# Patient Record
Sex: Female | Born: 1945 | Race: White | Hispanic: No | Marital: Married | Smoking: Former smoker
Health system: Southern US, Community
[De-identification: ages and names within clinical notes are randomized; demographics above are authoritative.]

## PROBLEM LIST (undated history)

## (undated) DIAGNOSIS — K219 Gastro-esophageal reflux disease without esophagitis: Secondary | ICD-10-CM

## (undated) DIAGNOSIS — T8859XA Other complications of anesthesia, initial encounter: Secondary | ICD-10-CM

## (undated) DIAGNOSIS — T4145XA Adverse effect of unspecified anesthetic, initial encounter: Secondary | ICD-10-CM

## (undated) DIAGNOSIS — Z9889 Other specified postprocedural states: Secondary | ICD-10-CM

## (undated) DIAGNOSIS — D649 Anemia, unspecified: Secondary | ICD-10-CM

## (undated) DIAGNOSIS — M810 Age-related osteoporosis without current pathological fracture: Secondary | ICD-10-CM

## (undated) DIAGNOSIS — K5790 Diverticulosis of intestine, part unspecified, without perforation or abscess without bleeding: Secondary | ICD-10-CM

## (undated) DIAGNOSIS — T7840XA Allergy, unspecified, initial encounter: Secondary | ICD-10-CM

## (undated) DIAGNOSIS — Z87442 Personal history of urinary calculi: Secondary | ICD-10-CM

## (undated) DIAGNOSIS — B029 Zoster without complications: Secondary | ICD-10-CM

## (undated) DIAGNOSIS — E039 Hypothyroidism, unspecified: Secondary | ICD-10-CM

## (undated) DIAGNOSIS — Z8719 Personal history of other diseases of the digestive system: Secondary | ICD-10-CM

## (undated) DIAGNOSIS — I10 Essential (primary) hypertension: Secondary | ICD-10-CM

## (undated) DIAGNOSIS — R112 Nausea with vomiting, unspecified: Secondary | ICD-10-CM

## (undated) DIAGNOSIS — E7800 Pure hypercholesterolemia, unspecified: Secondary | ICD-10-CM

## (undated) HISTORY — DX: Essential (primary) hypertension: I10

## (undated) HISTORY — DX: Pure hypercholesterolemia, unspecified: E78.00

## (undated) HISTORY — DX: Hypothyroidism, unspecified: E03.9

## (undated) HISTORY — PX: BREAST EXCISIONAL BIOPSY: SUR124

## (undated) HISTORY — PX: BREAST BIOPSY: SHX20

---

## 2004-07-11 ENCOUNTER — Ambulatory Visit: Payer: Self-pay | Admitting: Unknown Physician Specialty

## 2005-01-01 ENCOUNTER — Ambulatory Visit: Payer: Self-pay | Admitting: Internal Medicine

## 2005-01-27 ENCOUNTER — Ambulatory Visit: Payer: Self-pay | Admitting: Internal Medicine

## 2005-04-07 ENCOUNTER — Ambulatory Visit: Payer: Self-pay | Admitting: Gastroenterology

## 2006-02-04 ENCOUNTER — Ambulatory Visit: Payer: Self-pay | Admitting: Internal Medicine

## 2006-03-06 ENCOUNTER — Ambulatory Visit: Payer: Self-pay | Admitting: Gastroenterology

## 2007-03-16 ENCOUNTER — Ambulatory Visit: Payer: Self-pay | Admitting: Internal Medicine

## 2007-03-19 ENCOUNTER — Ambulatory Visit: Payer: Self-pay | Admitting: Internal Medicine

## 2007-04-20 ENCOUNTER — Ambulatory Visit: Payer: Self-pay | Admitting: Surgery

## 2008-06-16 HISTORY — PX: EYE SURGERY: SHX253

## 2008-09-21 ENCOUNTER — Ambulatory Visit: Payer: Self-pay | Admitting: Internal Medicine

## 2009-03-15 ENCOUNTER — Ambulatory Visit: Payer: Self-pay | Admitting: Gastroenterology

## 2009-04-25 ENCOUNTER — Ambulatory Visit: Payer: Self-pay | Admitting: Ophthalmology

## 2009-05-07 ENCOUNTER — Ambulatory Visit: Payer: Self-pay | Admitting: Ophthalmology

## 2009-12-14 ENCOUNTER — Ambulatory Visit: Payer: Self-pay | Admitting: Internal Medicine

## 2009-12-19 ENCOUNTER — Ambulatory Visit: Payer: Self-pay | Admitting: Internal Medicine

## 2011-02-12 ENCOUNTER — Ambulatory Visit: Payer: Self-pay | Admitting: Internal Medicine

## 2011-04-07 ENCOUNTER — Ambulatory Visit: Payer: Self-pay | Admitting: Internal Medicine

## 2011-10-12 LAB — HM COLONOSCOPY

## 2012-04-14 ENCOUNTER — Ambulatory Visit: Payer: Self-pay | Admitting: Ophthalmology

## 2012-04-14 LAB — HEMOGLOBIN: HGB: 12.6 g/dL (ref 12.0–16.0)

## 2012-04-14 LAB — POTASSIUM: Potassium: 3.2 mmol/L — ABNORMAL LOW (ref 3.5–5.1)

## 2012-04-16 ENCOUNTER — Telehealth: Payer: Self-pay | Admitting: Internal Medicine

## 2012-04-18 ENCOUNTER — Telehealth: Payer: Self-pay | Admitting: Internal Medicine

## 2012-04-18 MED ORDER — POTASSIUM CHLORIDE ER 10 MEQ PO TBCR: 10.0000 meq | EXTENDED_RELEASE_TABLET | Freq: Every day | ORAL | Status: DC

## 2012-04-18 NOTE — Telephone Encounter (Signed)
Received labs from Dr Dingledeins office.  Pt scheduled for cataract surgery 04/26/12.  Potassium (pre op) was 3.2.  Wanted me to address.  Discussed with pt.  She is doing well.  No chest pain or tightness.  No sob.  Will start her on KCL q day.  Will have opthalmology check potassium prior to her surgery.  She will keep her follow up with me.  Sent in rx for kcl q day #30 with no refills to [facility].

## 2012-04-20 NOTE — Telephone Encounter (Signed)
Opened in error

## 2012-04-26 ENCOUNTER — Ambulatory Visit: Payer: Self-pay | Admitting: Ophthalmology

## 2012-04-29 ENCOUNTER — Encounter: Payer: Self-pay | Admitting: Internal Medicine

## 2012-04-29 ENCOUNTER — Ambulatory Visit (INDEPENDENT_AMBULATORY_CARE_PROVIDER_SITE_OTHER): Payer: BC Managed Care – PPO | Admitting: Internal Medicine

## 2012-04-29 ENCOUNTER — Other Ambulatory Visit (HOSPITAL_COMMUNITY)
Admission: RE | Admit: 2012-04-29 | Discharge: 2012-04-29 | Disposition: A | Discharge status: Home / Self Care | Payer: BC Managed Care – PPO | Source: Ambulatory Visit | Attending: Internal Medicine | Admitting: Internal Medicine

## 2012-04-29 VITALS — BP 111/77 | HR 72 | Temp 98.1°F | Ht 63.5 in | Wt 137.0 lb

## 2012-04-29 DIAGNOSIS — Z139 Encounter for screening, unspecified: Secondary | ICD-10-CM

## 2012-04-29 DIAGNOSIS — R319 Hematuria, unspecified: Secondary | ICD-10-CM

## 2012-04-29 DIAGNOSIS — Z23 Encounter for immunization: Secondary | ICD-10-CM

## 2012-04-29 DIAGNOSIS — I10 Essential (primary) hypertension: Secondary | ICD-10-CM

## 2012-04-29 DIAGNOSIS — K219 Gastro-esophageal reflux disease without esophagitis: Secondary | ICD-10-CM

## 2012-04-29 DIAGNOSIS — Z01419 Encounter for gynecological examination (general) (routine) without abnormal findings: Secondary | ICD-10-CM | POA: Insufficient documentation

## 2012-04-29 DIAGNOSIS — D649 Anemia, unspecified: Secondary | ICD-10-CM

## 2012-04-29 DIAGNOSIS — R5383 Other fatigue: Secondary | ICD-10-CM

## 2012-04-29 DIAGNOSIS — Z1151 Encounter for screening for human papillomavirus (HPV): Secondary | ICD-10-CM | POA: Insufficient documentation

## 2012-04-29 DIAGNOSIS — E039 Hypothyroidism, unspecified: Secondary | ICD-10-CM

## 2012-04-29 DIAGNOSIS — R5381 Other malaise: Secondary | ICD-10-CM

## 2012-04-29 DIAGNOSIS — E7800 Pure hypercholesterolemia, unspecified: Secondary | ICD-10-CM

## 2012-04-29 NOTE — Patient Instructions (Addendum)
It was nice seeing you today.  I am glad you have been doing well.  Take Ranitidine 150mg  twice a day as we discussed.  Let me know if problems.

## 2012-05-01 ENCOUNTER — Encounter: Payer: Self-pay | Admitting: Internal Medicine

## 2012-05-01 DIAGNOSIS — E039 Hypothyroidism, unspecified: Secondary | ICD-10-CM | POA: Insufficient documentation

## 2012-05-01 DIAGNOSIS — D649 Anemia, unspecified: Secondary | ICD-10-CM | POA: Insufficient documentation

## 2012-05-01 DIAGNOSIS — E7800 Pure hypercholesterolemia, unspecified: Secondary | ICD-10-CM | POA: Insufficient documentation

## 2012-05-01 DIAGNOSIS — I10 Essential (primary) hypertension: Secondary | ICD-10-CM | POA: Insufficient documentation

## 2012-05-01 DIAGNOSIS — K219 Gastro-esophageal reflux disease without esophagitis: Secondary | ICD-10-CM | POA: Insufficient documentation

## 2012-05-01 DIAGNOSIS — R319 Hematuria, unspecified: Secondary | ICD-10-CM | POA: Insufficient documentation

## 2012-05-01 NOTE — Assessment & Plan Note (Signed)
Blood pressure under good control.  Same meds.  Check metabolic panel.  

## 2012-05-01 NOTE — Progress Notes (Signed)
  Subjective:    Patient ID: Savannah Cox, female    DOB: Mar 22, 1946, 66 y.o.   MRN: [id number]  HPI 66 year old female with past history of hypertension, hypercholesterolemia, reoccurring allergy and sinus problems, hypothyroidism and anemia who comes in today to follow up on these issues as well as for a complete physical exam.  She states she has been doing well.   Has been exercising.  Trying to watch what she eats.  Had some issues with loose stool.  Stopped the PPE, otc meds and her iron.  Bowels better.  No abdominal pain or cramping.  Some acid reflux off the PPI, so she started taking Ranitidine 75mg  q day and 2/day.  This is helping her reflux.  No nausea or vomiting.   Past Medical History  Diagnosis Date  . Hypertension   . Hypercholesterolemia   . Hypothyroidism     Review of Systems Patient denies any headache, lightheadedness or dizziness.  No significant sinus or allergy symptoms.  No chest pain, tightness or palpitations.  No increased shortness of breath, cough or congestion.  Acid reflux as outlined.  Improved on Ranitidine.  No nausea or vomiting.  No abdominal pain or cramping.  No bowel change, such as constipation, BRBPR or melana.  Was having some loose stool.  This improved with stopping the PPI.  No urine change.        Objective:   Physical Exam Filed Vitals:   04/29/12 1054  BP: 111/77  Pulse: 72  Temp: 98.1 F (17.71 C)   66 year old female in no acute distress.   HEENT:  Nares- clear.  Oropharynx - without lesions. NECK:  Supple.  Nontender.  No audible bruit.  HEART:  Appears to be regular. LUNGS:  No crackles or wheezing audible.  Respirations even and unlabored.  RADIAL PULSE:  Equal bilaterally.    BREASTS:  No nipple discharge or nipple retraction present.  Could not appreciate any distinct nodules or axillary adenopathy.  ABDOMEN:  Soft, nontender.  Bowel sounds present and normal.  No audible abdominal bruit.  GU:  Normal external genitalia.   Vaginal vault without lesions.  Cervix identified.  Pap performed. Could not appreciate any adnexal masses or tenderness.   RECTAL:  Heme negative.   EXTREMITIES:  No increased edema present.  DP pulses palpable and equal bilaterally.                         Assessment & Plan:  GYN.  Currently asymptomatic.  Follow.  Pelvic/pap today.   HEALTH MAINTENANCE.  Physical today.  Schedule mammogram.  Last mammogram 04/07/11- Birads II.  Colonoscopy 03/15/09 revealed diverticulosis - otherwise normal.

## 2012-05-01 NOTE — Assessment & Plan Note (Signed)
Intolerant to PPI.  Bowels better now that she is off.  On Ranitidine.  Will increase to 150mg  bid - to see if we can get complete resolution of her symptoms.  Follow.

## 2012-05-01 NOTE — Assessment & Plan Note (Signed)
On thyroid placement.   Check tsh.

## 2012-05-01 NOTE — Assessment & Plan Note (Addendum)
Not on cholesterol medicine.  Intolerant.  Low cholesterol diet and exercise.  Check lipid panel.  Follow.  Had muscle aches on simvastatin, pravastatin and Lipitor.     

## 2012-05-01 NOTE — Assessment & Plan Note (Signed)
Colonoscopy 03/15/09 revealed diverticulosis - otherwise normal.  Found to be iron deficient.  Was started on iron.  Off now.  EGD 10/28/11 - hiatal hernia - otherwise normal.  Check cbc and ferritin.

## 2012-05-01 NOTE — Assessment & Plan Note (Signed)
Worked up by urology.  Had negative CT except for left renal cyst.  Negative cystoscopy.  Urology felt no further w/up warranted.

## 2012-05-04 ENCOUNTER — Telehealth: Payer: Self-pay | Admitting: Internal Medicine

## 2012-05-04 ENCOUNTER — Other Ambulatory Visit (INDEPENDENT_AMBULATORY_CARE_PROVIDER_SITE_OTHER): Payer: BC Managed Care – PPO

## 2012-05-04 DIAGNOSIS — R5383 Other fatigue: Secondary | ICD-10-CM

## 2012-05-04 DIAGNOSIS — R5381 Other malaise: Secondary | ICD-10-CM

## 2012-05-04 DIAGNOSIS — E7800 Pure hypercholesterolemia, unspecified: Secondary | ICD-10-CM

## 2012-05-04 DIAGNOSIS — D649 Anemia, unspecified: Secondary | ICD-10-CM

## 2012-05-04 DIAGNOSIS — E039 Hypothyroidism, unspecified: Secondary | ICD-10-CM

## 2012-05-04 LAB — CBC WITH DIFFERENTIAL/PLATELET
Basophils Relative: 0.8 % (ref 0.0–3.0)
Eosinophils Absolute: 0.3 10*3/uL (ref 0.0–0.7)
Eosinophils Relative: 4.3 % (ref 0.0–5.0)
Hemoglobin: 13.1 g/dL (ref 12.0–15.0)
MCHC: 33.1 g/dL (ref 30.0–36.0)
MCV: 97.4 fl (ref 78.0–100.0)
Monocytes Absolute: 0.8 10*3/uL (ref 0.1–1.0)
Neutro Abs: 3.5 10*3/uL (ref 1.4–7.7)
RBC: 4.07 Mil/uL (ref 3.87–5.11)
WBC: 6.1 10*3/uL (ref 4.5–10.5)

## 2012-05-04 LAB — LIPID PANEL
HDL: 79.4 mg/dL (ref >39.00)
VLDL: 20.6 mg/dL (ref 0.0–40.0)

## 2012-05-04 LAB — COMPREHENSIVE METABOLIC PANEL
AST: 30 U/L (ref 0–37)
Albumin: 3.7 g/dL (ref 3.5–5.2)
Alkaline Phosphatase: 50 U/L (ref 39–117)
BUN: 15 mg/dL (ref 6–23)
Calcium: 9 mg/dL (ref 8.4–10.5)
Chloride: 101 mEq/L (ref 96–112)
Creatinine, Ser: 0.8 mg/dL (ref 0.4–1.2)
Glucose, Bld: 100 mg/dL — ABNORMAL HIGH (ref 70–99)

## 2012-05-04 LAB — LDL CHOLESTEROL, DIRECT: Direct LDL: 165.7 mg/dL

## 2012-05-04 LAB — TSH: TSH: 2.5 u[IU]/mL (ref 0.35–5.50)

## 2012-05-04 NOTE — Telephone Encounter (Signed)
Pt notified of lab results

## 2012-05-07 ENCOUNTER — Telehealth: Payer: Self-pay | Admitting: Internal Medicine

## 2012-05-07 NOTE — Telephone Encounter (Signed)
prempro 0.3 mg-1.5mg  tablet 28.0 ea twenty [place]  Prescribed refills 12 Date written 05/20/11 Take 1 tablet by mouth every day  [facility]

## 2012-05-11 ENCOUNTER — Other Ambulatory Visit: Payer: Self-pay | Admitting: *Deleted

## 2012-05-11 MED ORDER — CONJ ESTROG-MEDROXYPROGEST ACE 0.3-1.5 MG PO TABS: 1.0000 | ORAL_TABLET | Freq: Every day | ORAL | Status: DC

## 2012-05-11 NOTE — Telephone Encounter (Signed)
Refilled patients Pempro 0.3-1.5 mg

## 2012-05-12 NOTE — Telephone Encounter (Signed)
We received another refill request for prempro  Patient has been seen here.

## 2012-05-12 NOTE — Telephone Encounter (Signed)
Script filled [facility]/10/12 [facility]

## 2012-05-24 ENCOUNTER — Other Ambulatory Visit: Payer: Self-pay | Admitting: *Deleted

## 2012-05-24 MED ORDER — LISINOPRIL-HYDROCHLOROTHIAZIDE 10-12.5 MG PO TABS: 1.0000 | ORAL_TABLET | Freq: Every day | ORAL | Status: DC

## 2012-05-24 NOTE — Telephone Encounter (Signed)
Refilled lisinopril-hctz 10-12.5 mg

## 2012-06-03 ENCOUNTER — Ambulatory Visit: Payer: Self-pay | Admitting: Internal Medicine

## 2012-06-07 ENCOUNTER — Encounter: Payer: Self-pay | Admitting: Internal Medicine

## 2012-06-23 ENCOUNTER — Encounter: Payer: Self-pay | Admitting: Internal Medicine

## 2012-08-30 ENCOUNTER — Ambulatory Visit (INDEPENDENT_AMBULATORY_CARE_PROVIDER_SITE_OTHER): Payer: BC Managed Care – PPO | Admitting: Internal Medicine

## 2012-08-30 ENCOUNTER — Encounter: Payer: Self-pay | Admitting: Internal Medicine

## 2012-08-30 VITALS — BP 110/70 | HR 87 | Temp 97.6°F | Ht 63.5 in | Wt 141.5 lb

## 2012-08-30 DIAGNOSIS — D649 Anemia, unspecified: Secondary | ICD-10-CM

## 2012-08-30 DIAGNOSIS — K219 Gastro-esophageal reflux disease without esophagitis: Secondary | ICD-10-CM

## 2012-08-30 DIAGNOSIS — I10 Essential (primary) hypertension: Secondary | ICD-10-CM

## 2012-08-30 DIAGNOSIS — E7800 Pure hypercholesterolemia, unspecified: Secondary | ICD-10-CM

## 2012-08-30 DIAGNOSIS — R319 Hematuria, unspecified: Secondary | ICD-10-CM

## 2012-08-30 DIAGNOSIS — E039 Hypothyroidism, unspecified: Secondary | ICD-10-CM

## 2012-09-05 ENCOUNTER — Encounter: Payer: Self-pay | Admitting: Internal Medicine

## 2012-09-05 NOTE — Assessment & Plan Note (Signed)
Blood pressure under good control.  Same meds.  Follow metabolic panel.   

## 2012-09-05 NOTE — Assessment & Plan Note (Signed)
No acid reflux now.  Follow.

## 2012-09-05 NOTE — Assessment & Plan Note (Signed)
Not on cholesterol medicine.  Intolerant.  Low cholesterol diet and exercise.  Check lipid panel.  Follow.  Had muscle aches on simvastatin, pravastatin and Lipitor.

## 2012-09-05 NOTE — Progress Notes (Signed)
  Subjective:    Patient ID: Savannah Cox, female    DOB: Feb 22, 1946, 67 y.o.   MRN: [id number]  HPI 67 year old female with past history of hypertension, hypercholesterolemia, reoccurring allergy and sinus problems, hypothyroidism and anemia who comes in today for a scheduled follow up.  She states she has been doing well.   [place]  No cardiac symptoms with increased activity or exertion.  No acid reflux.  No nausea or vomiting.    Past Medical History  Diagnosis Date  . Hypertension   . Hypercholesterolemia   . Hypothyroidism     Current Outpatient Prescriptions on File Prior to Visit  Medication Sig Dispense Refill  . estrogen, conjugated,-medroxyprogesterone (PREMPRO) 0.3-1.5 MG per tablet Take 1 tablet by mouth daily.  30 tablet  3  . levothyroxine (SYNTHROID, LEVOTHROID) 50 MCG tablet Take 50 mcg by mouth daily.      Marland Kitchen lisinopril-hydrochlorothiazide (PRINZIDE,ZESTORETIC) 10-12.5 MG per tablet Take 1 tablet by mouth daily.  90 tablet  3  . loratadine (CLARITIN) 10 MG tablet Take 10 mg by mouth daily.      . ranitidine (ZANTAC) 75 MG tablet Take 75 mg by mouth 2 (two) times daily.      Marland Kitchen aspirin 81 MG tablet Take 81 mg by mouth daily.      . potassium chloride (K-DUR) 10 MEQ tablet Take 1 tablet (10 mEq total) by mouth daily.  30 tablet  0   No current facility-administered medications on file prior to visit.     Review of Systems Patient denies any headache, lightheadedness or dizziness.  No significant sinus or allergy symptoms.  No chest pain, tightness or palpitations.  No increased shortness of breath, cough or congestion.  No acid reflux.  No nausea or vomiting.  No abdominal pain or cramping.  No bowel change, such as constipation, BRBPR or melana.  No urine change.        Objective:   Physical Exam  Filed Vitals:   08/30/12 1339  BP: 110/70  Pulse: 87  Temp: 97.6 F (36.4 C)   Blood pressure recheck:  124/78, pulse 42  67 year  old female in no acute distress.   HEENT:  Nares- clear.  Oropharynx - without lesions. NECK:  Supple.  Nontender.  No audible bruit.  HEART:  Appears to be regular. LUNGS:  No crackles or wheezing audible.  Respirations even and unlabored.  RADIAL PULSE:  Equal bilaterally.   .  ABDOMEN:  Soft, nontender.  Bowel sounds present and normal.  No audible abdominal bruit.    EXTREMITIES:  No increased edema present.  DP pulses palpable and equal bilaterally.                         Assessment & Plan:  GYN.  Currently asymptomatic.  Follow.  Pap last visit - normal.   HEALTH MAINTENANCE.  Physical last 04/29/12.  Mammogram 06/03/12- Birads II.  Colonoscopy 03/15/09 revealed diverticulosis - otherwise normal.

## 2012-09-05 NOTE — Assessment & Plan Note (Signed)
Colonoscopy 03/15/09 revealed diverticulosis - otherwise normal.  Found to be iron deficient.  Was started on iron.  Off now.  EGD 10/28/11 - hiatal hernia - otherwise normal.  Follow cbc and ferritin.    

## 2012-09-05 NOTE — Assessment & Plan Note (Signed)
On thyroid placement.  Follow tsh.   

## 2012-09-05 NOTE — Assessment & Plan Note (Signed)
Worked up by urology.  Had negative CT except for left renal cyst.  Negative cystoscopy.  Urology felt no further w/up warranted.  Will recheck urine with next labs.

## 2012-09-08 ENCOUNTER — Other Ambulatory Visit: Payer: Self-pay | Admitting: *Deleted

## 2012-09-08 MED ORDER — LEVOTHYROXINE SODIUM 50 MCG PO TABS: 50.0000 ug | ORAL_TABLET | Freq: Every day | ORAL | Status: DC

## 2012-09-08 NOTE — Telephone Encounter (Signed)
Rx sent in to pharmacy. 

## 2012-09-15 ENCOUNTER — Other Ambulatory Visit: Payer: Self-pay | Admitting: *Deleted

## 2012-09-15 ENCOUNTER — Other Ambulatory Visit (INDEPENDENT_AMBULATORY_CARE_PROVIDER_SITE_OTHER): Payer: BC Managed Care – PPO

## 2012-09-15 DIAGNOSIS — E7800 Pure hypercholesterolemia, unspecified: Secondary | ICD-10-CM

## 2012-09-15 DIAGNOSIS — D649 Anemia, unspecified: Secondary | ICD-10-CM

## 2012-09-15 DIAGNOSIS — R319 Hematuria, unspecified: Secondary | ICD-10-CM

## 2012-09-15 DIAGNOSIS — I10 Essential (primary) hypertension: Secondary | ICD-10-CM

## 2012-09-15 LAB — LIPID PANEL
Total CHOL/HDL Ratio: 4
Triglycerides: 149 mg/dL (ref 0.0–149.0)
VLDL: 29.8 mg/dL (ref 0.0–40.0)

## 2012-09-15 LAB — BASIC METABOLIC PANEL
BUN: 14 mg/dL (ref 6–23)
CO2: 27 mEq/L (ref 19–32)
Chloride: 103 mEq/L (ref 96–112)
Creatinine, Ser: 0.7 mg/dL (ref 0.4–1.2)

## 2012-09-15 LAB — HEPATIC FUNCTION PANEL
AST: 25 U/L (ref 0–37)
Total Bilirubin: 0.5 mg/dL (ref 0.3–1.2)

## 2012-09-15 LAB — CBC WITH DIFFERENTIAL/PLATELET
Basophils Relative: 0.9 % (ref 0.0–3.0)
Eosinophils Relative: 3.8 % (ref 0.0–5.0)
HCT: 38.7 % (ref 36.0–46.0)
Hemoglobin: 13.1 g/dL (ref 12.0–15.0)
Lymphs Abs: 1.5 10*3/uL (ref 0.7–4.0)
MCV: 95.5 fl (ref 78.0–100.0)
Monocytes Absolute: 0.7 10*3/uL (ref 0.1–1.0)
Neutro Abs: 3.4 10*3/uL (ref 1.4–7.7)
Platelets: 275 10*3/uL (ref 150.0–400.0)
WBC: 5.8 10*3/uL (ref 4.5–10.5)

## 2012-09-15 LAB — URINALYSIS, ROUTINE W REFLEX MICROSCOPIC
Bilirubin Urine: NEGATIVE
Ketones, ur: NEGATIVE
Total Protein, Urine: NEGATIVE
Urine Glucose: NEGATIVE
pH: 7.5 (ref 5.0–8.0)

## 2012-09-15 LAB — LDL CHOLESTEROL, DIRECT: Direct LDL: 154.1 mg/dL

## 2012-09-16 MED ORDER — CONJ ESTROG-MEDROXYPROGEST ACE 0.3-1.5 MG PO TABS: 1.0000 | ORAL_TABLET | Freq: Every day | ORAL | Status: DC

## 2012-09-16 NOTE — Telephone Encounter (Signed)
Rx sent in to pharmacy. 

## 2012-12-31 ENCOUNTER — Encounter: Payer: Self-pay | Admitting: Internal Medicine

## 2012-12-31 ENCOUNTER — Ambulatory Visit (INDEPENDENT_AMBULATORY_CARE_PROVIDER_SITE_OTHER): Payer: 59 | Admitting: Internal Medicine

## 2012-12-31 VITALS — BP 110/70 | HR 70 | Temp 98.3°F | Ht 63.5 in | Wt 140.5 lb

## 2012-12-31 DIAGNOSIS — K219 Gastro-esophageal reflux disease without esophagitis: Secondary | ICD-10-CM

## 2012-12-31 DIAGNOSIS — I10 Essential (primary) hypertension: Secondary | ICD-10-CM

## 2012-12-31 DIAGNOSIS — D649 Anemia, unspecified: Secondary | ICD-10-CM

## 2012-12-31 DIAGNOSIS — R319 Hematuria, unspecified: Secondary | ICD-10-CM

## 2012-12-31 DIAGNOSIS — R079 Chest pain, unspecified: Secondary | ICD-10-CM

## 2012-12-31 DIAGNOSIS — E7800 Pure hypercholesterolemia, unspecified: Secondary | ICD-10-CM

## 2012-12-31 DIAGNOSIS — E039 Hypothyroidism, unspecified: Secondary | ICD-10-CM

## 2012-12-31 MED ORDER — OMEPRAZOLE 20 MG PO CPDR: 20.0000 mg | DELAYED_RELEASE_CAPSULE | Freq: Every day | ORAL | Status: DC

## 2012-12-31 NOTE — Patient Instructions (Signed)
prilosec (omeprazole) 20mg  one per day - take 30 minutes before breakfast.

## 2013-01-02 ENCOUNTER — Encounter: Payer: Self-pay | Admitting: Internal Medicine

## 2013-01-02 NOTE — Assessment & Plan Note (Signed)
Blood pressure under good control.  Same meds.  Follow metabolic panel.   

## 2013-01-02 NOTE — Assessment & Plan Note (Signed)
Colonoscopy 03/15/09 revealed diverticulosis - otherwise normal.  Found to be iron deficient.  Was started on iron.  Off now.  EGD 10/28/11 - hiatal hernia - otherwise normal.  Follow cbc and ferritin.    

## 2013-01-02 NOTE — Assessment & Plan Note (Signed)
Symptoms as outlined.  Had intolerance to protonix.  Will start omeprazole 20mg  q am and zantac 150mg  q pm.  Follow closely.  Get her back in soon to reassess.  May need GI referral for evaluation and question of need for a f/u EGD.  Cardiology w/up planned as outlined.

## 2013-01-02 NOTE — Assessment & Plan Note (Signed)
On thyroid placement.  Follow tsh.   

## 2013-01-02 NOTE — Assessment & Plan Note (Addendum)
Worked up by urology.  Had negative CT except for left renal cyst.  Negative cystoscopy.  Urology felt no further w/up warranted.  Urinalysis 4/14 revealed 3-6 rbc's.  Follow.

## 2013-01-02 NOTE — Progress Notes (Signed)
  Subjective:    Patient ID: Savannah Cox, female    DOB: 20-Nov-1945, 67 y.o.   MRN: [id number]  HPI 67 year old female with past history of hypertension, hypercholesterolemia, reoccurring allergy and sinus problems, hypothyroidism and anemia who comes in today for a scheduled follow up.  She reports increased acid and indigestion.  Taking gas x and Tums.  Was on protonix.  Did not tolerate protonix.  On ranitidine.  Did feel this helped some.  Was questioning if this was starting to affect her bowel movements.  No significant bowel change now.  No vomiting.  Some increased gas.  Stopped soft drinks.  This has helped some.  She reports actually feeling acid/hot water coming up.  Worse if she bends over.  She also has noticed some increased sob with her exercise and exertion.     Past Medical History  Diagnosis Date  . Hypertension   . Hypercholesterolemia   . Hypothyroidism     Current Outpatient Prescriptions on File Prior to Visit  Medication Sig Dispense Refill  . estrogen, conjugated,-medroxyprogesterone (PREMPRO) 0.3-1.5 MG per tablet Take 1 tablet by mouth daily.  30 tablet  3  . levothyroxine (SYNTHROID, LEVOTHROID) 50 MCG tablet Take 1 tablet (50 mcg total) by mouth daily.  30 tablet  5  . lisinopril-hydrochlorothiazide (PRINZIDE,ZESTORETIC) 10-12.5 MG per tablet Take 1 tablet by mouth daily.  90 tablet  3  . loratadine (CLARITIN) 10 MG tablet Take 10 mg by mouth daily.       No current facility-administered medications on file prior to visit.    Review of Systems Patient denies any headache, lightheadedness or dizziness.  No significant sinus or allergy symptoms.   No increased cough or congestion.  Increased acid and indigestion.  Increased gas.  No abdominal pain or cramping. No vomiting.   No significant bowel change.   No BRBPR or melana.  No urine change.  Does report some sob with exertion.  She was very emotional this visit.  Intermittent crying.  Increased stress with her  mother's health.       Objective:   Physical Exam  Filed Vitals:   12/31/12 1331  BP: 110/70  Pulse: 70  Temp: 98.3 F (36.8 C)   Pulse 52  67 year old female in no acute distress.   HEENT:  Nares- clear.  Oropharynx - without lesions. NECK:  Supple.  Nontender.  No audible bruit.  HEART:  Appears to be regular. LUNGS:  No crackles or wheezing audible.  Respirations even and unlabored.  RADIAL PULSE:  Equal bilaterally.   .  ABDOMEN:  Soft, nontender.  Bowel sounds present and normal.  No audible abdominal bruit.    EXTREMITIES:  No increased edema present.  DP pulses palpable and equal bilaterally.                         Assessment & Plan:  CARDIOVASCULAR.  With the symptoms and sob with exertion, EKG obtained and revealed SR with no acute ischemic changes.  Will obtain stress echo to evaluate for ischemia.  Treat reflux.    GYN.  Currently asymptomatic.  Follow.  Pap - normal.   HEALTH MAINTENANCE.  Physical last 04/29/12.  Mammogram 06/03/12- Birads II.  Colonoscopy 03/15/09 revealed diverticulosis - otherwise normal.

## 2013-01-02 NOTE — Assessment & Plan Note (Signed)
Not on cholesterol medicine.  Intolerant.  Low cholesterol diet and exercise.  Follow lipid panel.  Had muscle aches on simvastatin, pravastatin and Lipitor.   

## 2013-01-13 ENCOUNTER — Ambulatory Visit (INDEPENDENT_AMBULATORY_CARE_PROVIDER_SITE_OTHER): Payer: 59 | Admitting: Internal Medicine

## 2013-01-13 ENCOUNTER — Encounter: Payer: Self-pay | Admitting: Internal Medicine

## 2013-01-13 VITALS — BP 120/64 | HR 65 | Temp 98.9°F | Ht 63.5 in | Wt 139.5 lb

## 2013-01-13 DIAGNOSIS — E7800 Pure hypercholesterolemia, unspecified: Secondary | ICD-10-CM

## 2013-01-13 DIAGNOSIS — D649 Anemia, unspecified: Secondary | ICD-10-CM

## 2013-01-13 DIAGNOSIS — E039 Hypothyroidism, unspecified: Secondary | ICD-10-CM

## 2013-01-13 DIAGNOSIS — I10 Essential (primary) hypertension: Secondary | ICD-10-CM

## 2013-01-13 DIAGNOSIS — R079 Chest pain, unspecified: Secondary | ICD-10-CM

## 2013-01-13 DIAGNOSIS — K219 Gastro-esophageal reflux disease without esophagitis: Secondary | ICD-10-CM

## 2013-01-13 DIAGNOSIS — R109 Unspecified abdominal pain: Secondary | ICD-10-CM

## 2013-01-13 DIAGNOSIS — R102 Pelvic and perineal pain: Secondary | ICD-10-CM

## 2013-01-13 DIAGNOSIS — R319 Hematuria, unspecified: Secondary | ICD-10-CM

## 2013-01-13 LAB — BASIC METABOLIC PANEL
BUN: 11 mg/dL (ref 6–23)
Chloride: 103 mEq/L (ref 96–112)
Potassium: 3.6 mEq/L (ref 3.5–5.1)
Sodium: 138 mEq/L (ref 135–145)

## 2013-01-15 ENCOUNTER — Encounter: Payer: Self-pay | Admitting: Internal Medicine

## 2013-01-15 NOTE — Assessment & Plan Note (Signed)
Worked up by urology.  Had negative CT except for left renal cyst.  Negative cystoscopy.  Urology felt no further w/up warranted.  Urinalysis 4/14 revealed 3-6 rbc's.  Follow.  She desires no referral back to urology.

## 2013-01-15 NOTE — Assessment & Plan Note (Signed)
Blood pressure under good control.  Same meds.  Follow metabolic panel.   

## 2013-01-15 NOTE — Assessment & Plan Note (Signed)
Not on cholesterol medicine.  Intolerant.  Low cholesterol diet and exercise.  Follow lipid panel.  Had muscle aches on simvastatin, pravastatin and Lipitor.   

## 2013-01-15 NOTE — Assessment & Plan Note (Signed)
On thyroid placement.  Follow tsh.   

## 2013-01-15 NOTE — Progress Notes (Signed)
Subjective:    Patient ID: Savannah Cox, female    DOB: [birth year], 67 y.o.   MRN: [id number]  HPI 67 year old female with past history of hypertension, hypercholesterolemia, reoccurring allergy and sinus problems, hypothyroidism and anemia who comes in today for a scheduled follow up.  She is taking the protonix now.  The acid reflux is better.  She is still having the lower abdominal aching.  Some nausea.  No vomiting.  She is eating and drinking.  No further chest discomfort or sob.  Scheduled for her stress test today.  No urinary symptoms  Still with increased stress.  Coping with her mother's medical issues.  Desires no further intervention.  Has good support.     Past Medical History  Diagnosis Date  . Hypertension   . Hypercholesterolemia   . Hypothyroidism     Current Outpatient Prescriptions on File Prior to Visit  Medication Sig Dispense Refill  . estrogen, conjugated,-medroxyprogesterone (PREMPRO) 0.3-1.5 MG per tablet Take 1 tablet by mouth daily.  30 tablet  3  . glucosamine-chondroitin 500-400 MG tablet Take 1 tablet by mouth daily.      Marland Kitchen levothyroxine (SYNTHROID, LEVOTHROID) 50 MCG tablet Take 1 tablet (50 mcg total) by mouth daily.  30 tablet  5  . lisinopril-hydrochlorothiazide (PRINZIDE,ZESTORETIC) 10-12.5 MG per tablet Take 1 tablet by mouth daily.  90 tablet  3  . loratadine (CLARITIN) 10 MG tablet Take 10 mg by mouth daily.      Marland Kitchen omeprazole (PRILOSEC) 20 MG capsule Take 1 capsule (20 mg total) by mouth daily.  30 capsule  1  . Probiotic Product (PROBIOTIC DAILY PO) Take by mouth.       No current facility-administered medications on file prior to visit.    Review of Systems Patient denies any headache, lightheadedness or dizziness.  No significant sinus or allergy symptoms.   No increased cough or congestion.  Acid reflux better.  On protonix.  Does report persistent lower abdominal aching.  Some nausea.  No vomiting.   No significant bowel change.   No BRBPR  or melana.  No urine change.  She was emotional this visit.  Intermittent crying.  Increased stress with her mother's health.  Desires no further intervention at this time.       Objective:   Physical Exam  Filed Vitals:   01/13/13 0926  BP: 120/64  Pulse: 65  Temp: 98.9 F (37.2 C)   Pulse 24  67 year old female in no acute distress.   HEENT:  Nares- clear.  Oropharynx - without lesions. NECK:  Supple.  Nontender.  No audible bruit.  HEART:  Appears to be regular. LUNGS:  No crackles or wheezing audible.  Respirations even and unlabored.  RADIAL PULSE:  Equal bilaterally.   .  ABDOMEN:  Soft.  Minimal tenderness to palpation over the lower abdomen.  Bowel sounds present and normal.  No audible abdominal bruit.    EXTREMITIES:  No increased edema present.  DP pulses palpable and equal bilaterally.                         Assessment & Plan:  CARDIOVASCULAR.  She is scheduled for a stress test later today.  Obtain results.   Chest discomfort improved.     GYN.   Pap - normal.  Lower abdominal/pelvic pain.  Will check CT abdomen/pelvis.    HEALTH MAINTENANCE.  Physical last 04/29/12.  Mammogram 06/03/12- Birads II.  Colonoscopy 03/15/09 revealed diverticulosis - otherwise normal.

## 2013-01-15 NOTE — Assessment & Plan Note (Signed)
On protonix.  Symptoms improved.  Follow.  Has had EGD.

## 2013-01-15 NOTE — Assessment & Plan Note (Signed)
Colonoscopy 03/15/09 revealed diverticulosis - otherwise normal.  Found to be iron deficient.  Was started on iron.  Off now.  EGD 10/28/11 - hiatal hernia - otherwise normal.  Follow cbc and ferritin.    

## 2013-01-18 ENCOUNTER — Other Ambulatory Visit: Payer: Medicare Other

## 2013-01-19 ENCOUNTER — Telehealth: Payer: Self-pay | Admitting: Internal Medicine

## 2013-01-19 ENCOUNTER — Other Ambulatory Visit: Payer: Self-pay

## 2013-01-19 NOTE — Telephone Encounter (Signed)
Pt notified via my chart - stress test negative.

## 2013-01-19 NOTE — Progress Notes (Signed)
Opened in error

## 2013-01-19 NOTE — Telephone Encounter (Signed)
Message copied by Charm Barges on Wed Jan 19, 2013  4:51 PM ------      Message from: Warden Fillers      Created: Wed Jan 19, 2013  4:39 PM       It looks like it has been corrected now ------

## 2013-01-21 ENCOUNTER — Encounter: Payer: Self-pay | Admitting: *Deleted

## 2013-01-25 ENCOUNTER — Other Ambulatory Visit: Payer: Medicare Other

## 2013-02-07 ENCOUNTER — Telehealth: Payer: Self-pay | Admitting: Internal Medicine

## 2013-02-07 MED ORDER — CONJ ESTROG-MEDROXYPROGEST ACE 0.3-1.5 MG PO TABS: 1.0000 | ORAL_TABLET | Freq: Every day | ORAL | Status: DC

## 2013-02-07 NOTE — Telephone Encounter (Signed)
Refilled prempro #30 with 3 refills.  

## 2013-02-21 ENCOUNTER — Other Ambulatory Visit: Payer: Self-pay | Admitting: *Deleted

## 2013-02-21 ENCOUNTER — Ambulatory Visit (INDEPENDENT_AMBULATORY_CARE_PROVIDER_SITE_OTHER): Payer: 59 | Admitting: Internal Medicine

## 2013-02-21 ENCOUNTER — Encounter: Payer: Self-pay | Admitting: Internal Medicine

## 2013-02-21 ENCOUNTER — Ambulatory Visit (INDEPENDENT_AMBULATORY_CARE_PROVIDER_SITE_OTHER)
Admission: RE | Admit: 2013-02-21 | Discharge: 2013-02-21 | Disposition: A | Discharge status: Home / Self Care | Payer: 59 | Source: Ambulatory Visit | Attending: Internal Medicine | Admitting: Internal Medicine

## 2013-02-21 VITALS — BP 110/70 | HR 67 | Temp 98.2°F | Ht 63.5 in | Wt 139.5 lb

## 2013-02-21 DIAGNOSIS — I10 Essential (primary) hypertension: Secondary | ICD-10-CM

## 2013-02-21 DIAGNOSIS — R0602 Shortness of breath: Secondary | ICD-10-CM

## 2013-02-21 DIAGNOSIS — R109 Unspecified abdominal pain: Secondary | ICD-10-CM

## 2013-02-21 DIAGNOSIS — Z23 Encounter for immunization: Secondary | ICD-10-CM

## 2013-02-21 DIAGNOSIS — E7800 Pure hypercholesterolemia, unspecified: Secondary | ICD-10-CM

## 2013-02-21 DIAGNOSIS — K219 Gastro-esophageal reflux disease without esophagitis: Secondary | ICD-10-CM

## 2013-02-21 DIAGNOSIS — D649 Anemia, unspecified: Secondary | ICD-10-CM

## 2013-02-21 DIAGNOSIS — R319 Hematuria, unspecified: Secondary | ICD-10-CM

## 2013-02-21 DIAGNOSIS — E039 Hypothyroidism, unspecified: Secondary | ICD-10-CM

## 2013-02-21 MED ORDER — OMEPRAZOLE 20 MG PO CPDR: 20.0000 mg | DELAYED_RELEASE_CAPSULE | Freq: Every day | ORAL | Status: DC

## 2013-02-21 MED ORDER — LISINOPRIL-HYDROCHLOROTHIAZIDE 10-12.5 MG PO TABS: 1.0000 | ORAL_TABLET | Freq: Every day | ORAL | Status: DC

## 2013-02-21 MED ORDER — LEVOTHYROXINE SODIUM 50 MCG PO TABS: 50.0000 ug | ORAL_TABLET | Freq: Every day | ORAL | Status: DC

## 2013-02-21 NOTE — Assessment & Plan Note (Signed)
Colonoscopy 03/15/09 revealed diverticulosis - otherwise normal.  Found to be iron deficient.  Was started on iron.  Off now.  EGD 10/28/11 - hiatal hernia - otherwise normal.  Follow cbc and ferritin.    

## 2013-02-21 NOTE — Assessment & Plan Note (Signed)
Not on cholesterol medicine.  Intolerant.  Low cholesterol diet and exercise.  Follow lipid panel.  Had muscle aches on simvastatin, pravastatin and Lipitor.

## 2013-02-21 NOTE — Assessment & Plan Note (Signed)
Blood pressure under good control.  Same meds.  Follow metabolic panel.   

## 2013-02-21 NOTE — Progress Notes (Signed)
Subjective:    Patient ID: Dorotea Hand, female    DOB: September 09, 1945, 67 y.o.   MRN: [id number]  HPI 67 year old female with past history of hypertension, hypercholesterolemia, reoccurring allergy and sinus problems, hypothyroidism and anemia who comes in today for a scheduled follow up.  She is taking omeprazole and ranitidine now.  The acid reflux is better.  No abdominal pain or cramping now.  No vomiting.  No nausea.  She is eating and drinking.  No further chest discomfort or sob.  She did have her stress test.  Negative for ischemia.  No urinary symptoms  Still with increased stress.  Coping with her mother's medical issues.  Her mother was started on welbutrin.  Is doing better.  Stress is some better.  Desires no further intervention.  Has good support.  She does report still noticing some sob with exertion.  No cough.  She did smoke for many years.  Quit 20 years ago.     Past Medical History  Diagnosis Date  . Hypertension   . Hypercholesterolemia   . Hypothyroidism     Current Outpatient Prescriptions on File Prior to Visit  Medication Sig Dispense Refill  . estrogen, conjugated,-medroxyprogesterone (PREMPRO) 0.3-1.5 MG per tablet Take 1 tablet by mouth daily.  30 tablet  3  . glucosamine-chondroitin 500-400 MG tablet Take 1 tablet by mouth daily.      Marland Kitchen levothyroxine (SYNTHROID, LEVOTHROID) 50 MCG tablet Take 1 tablet (50 mcg total) by mouth daily.  30 tablet  5  . lisinopril-hydrochlorothiazide (PRINZIDE,ZESTORETIC) 10-12.5 MG per tablet Take 1 tablet by mouth daily.  90 tablet  3  . loratadine (CLARITIN) 10 MG tablet Take 10 mg by mouth daily.      Marland Kitchen omeprazole (PRILOSEC) 20 MG capsule Take 1 capsule (20 mg total) by mouth daily.  30 capsule  1  . Probiotic Product (PROBIOTIC DAILY PO) Take by mouth.       No current facility-administered medications on file prior to visit.    Review of Systems Patient denies any headache, lightheadedness or dizziness.  No significant  sinus or allergy symptoms.   No increased cough or congestion.  Some sob with exertion or if she goes out in the heat.  Acid reflux better.  On omeprazole and ranitidine.  Acid reflux better.  No nausea.  No vomiting.   The abdominal pain has resolved.  No significant bowel change.   No BRBPR or melana.  No urine change.   Increased stress with her mother's health.  Desires no further intervention at this time.  Stress is better.        Objective:   Physical Exam  Filed Vitals:   02/21/13 0915  BP: 110/70  Pulse: 67  Temp: 98.2 F (36.8 C)   Pulse 54  67 year old female in no acute distress.   HEENT:  Nares- clear.  Oropharynx - without lesions. NECK:  Supple.  Nontender.  No audible bruit.  HEART:  Appears to be regular. LUNGS:  No crackles or wheezing audible.  Respirations even and unlabored.  RADIAL PULSE:  Equal bilaterally.   .  ABDOMEN:  Soft.  Non tender.   Bowel sounds present and normal.  No audible abdominal bruit.    EXTREMITIES:  No increased edema present.  DP pulses palpable and equal bilaterally.                         Assessment &  Plan:  CARDIOVASCULAR.  Recent stress test negative for ischemia.  With the sob with exertion, will check cxr.  Further w/up pending results.      GYN.   Pap - normal.   HEALTH MAINTENANCE.  Physical last 04/29/12.  Mammogram 06/03/12- Birads II.  Colonoscopy 03/15/09 revealed diverticulosis - otherwise normal.

## 2013-02-21 NOTE — Assessment & Plan Note (Addendum)
Worked up by urology.  Had negative CT except for left renal cyst.  Negative cystoscopy.  Urology felt no further w/up warranted.  Urinalysis 4/14 revealed 3-6 rbc's.  Follow.  She desires no referral back to urology.  Recent CT - hypodensity - kidney.  See report.

## 2013-02-21 NOTE — Assessment & Plan Note (Signed)
On thyroid placement.  Follow tsh.   

## 2013-02-21 NOTE — Assessment & Plan Note (Addendum)
On omeprazole and ranitidine.  Symptoms improved.  Follow.  Has had EGD.     

## 2013-02-22 ENCOUNTER — Encounter: Payer: Self-pay | Admitting: Internal Medicine

## 2013-02-22 DIAGNOSIS — R109 Unspecified abdominal pain: Secondary | ICD-10-CM | POA: Insufficient documentation

## 2013-02-22 NOTE — Assessment & Plan Note (Signed)
Recent stress test negative for ischemia.  Check cxr.  Further w/up pending results.

## 2013-02-22 NOTE — Assessment & Plan Note (Signed)
See report for recent CT scan.  Pain/discomfort resolved.    

## 2013-04-28 ENCOUNTER — Encounter: Payer: Self-pay | Admitting: Internal Medicine

## 2013-04-28 ENCOUNTER — Ambulatory Visit (INDEPENDENT_AMBULATORY_CARE_PROVIDER_SITE_OTHER): Payer: Medicare Other | Admitting: Internal Medicine

## 2013-04-28 VITALS — BP 110/70 | HR 70 | Temp 98.3°F | Ht 63.0 in | Wt 139.5 lb

## 2013-04-28 DIAGNOSIS — R319 Hematuria, unspecified: Secondary | ICD-10-CM

## 2013-04-28 DIAGNOSIS — Z1239 Encounter for other screening for malignant neoplasm of breast: Secondary | ICD-10-CM

## 2013-04-28 DIAGNOSIS — R0602 Shortness of breath: Secondary | ICD-10-CM

## 2013-04-28 DIAGNOSIS — E7800 Pure hypercholesterolemia, unspecified: Secondary | ICD-10-CM

## 2013-04-28 DIAGNOSIS — Z23 Encounter for immunization: Secondary | ICD-10-CM

## 2013-04-28 DIAGNOSIS — I10 Essential (primary) hypertension: Secondary | ICD-10-CM

## 2013-04-28 DIAGNOSIS — R109 Unspecified abdominal pain: Secondary | ICD-10-CM

## 2013-04-28 DIAGNOSIS — D649 Anemia, unspecified: Secondary | ICD-10-CM

## 2013-04-28 DIAGNOSIS — K219 Gastro-esophageal reflux disease without esophagitis: Secondary | ICD-10-CM

## 2013-04-28 DIAGNOSIS — E039 Hypothyroidism, unspecified: Secondary | ICD-10-CM

## 2013-04-28 NOTE — Progress Notes (Signed)
Pre-visit discussion using our clinic review tool. No additional management support is needed unless otherwise documented below in the visit note.  

## 2013-05-01 ENCOUNTER — Encounter: Payer: Self-pay | Admitting: Internal Medicine

## 2013-05-01 NOTE — Assessment & Plan Note (Signed)
Recent stress test negative for ischemia.  Check cxr negative.  She feels she is doing well.  Follow.   

## 2013-05-01 NOTE — Assessment & Plan Note (Signed)
See report for recent CT scan.  Pain/discomfort resolved.    

## 2013-05-01 NOTE — Assessment & Plan Note (Signed)
Worked up by urology.  Had negative CT except for left renal cyst.  Negative cystoscopy.  Urology felt no further w/up warranted.  Urinalysis 4/14 revealed 3-6 rbc's.  Follow.  She desires no referral back to urology.  Recent CT - hypodensity - kidney.  See report.

## 2013-05-01 NOTE — Progress Notes (Signed)
Subjective:    Patient ID: Savannah Cox, female    DOB: 11-08-1945, 67 y.o.   MRN: [id number]  HPI 67 year old female with past history of hypertension, hypercholesterolemia, reoccurring allergy and sinus problems, hypothyroidism and anemia who comes in today to follow up on these issues as well as for a complete physical exam.  She is taking omeprazole and ranitidine now.  The acid reflux is better.  No abdominal pain or cramping now.  No vomiting.  No nausea.  She is eating and drinking.  No further chest discomfort or sob.  She did have her stress test.  Negative for ischemia.  No urinary symptoms  Still with increased stress.  Coping with her mother's medical issues.  Her mother was started on welbutrin.  Is doing better.  Stress is some better.  Desires no further intervention.  Has good support.  Overall feels better.      Past Medical History  Diagnosis Date  . Hypertension   . Hypercholesterolemia   . Hypothyroidism     Current Outpatient Prescriptions on File Prior to Visit  Medication Sig Dispense Refill  . estrogen, conjugated,-medroxyprogesterone (PREMPRO) 0.3-1.5 MG per tablet Take 1 tablet by mouth daily.  30 tablet  3  . glucosamine-chondroitin 500-400 MG tablet Take 1 tablet by mouth daily.      Marland Kitchen levothyroxine (SYNTHROID, LEVOTHROID) 50 MCG tablet Take 1 tablet (50 mcg total) by mouth daily.  30 tablet  5  . lisinopril-hydrochlorothiazide (PRINZIDE,ZESTORETIC) 10-12.5 MG per tablet Take 1 tablet by mouth daily.  30 tablet  5  . loratadine (CLARITIN) 10 MG tablet Take 10 mg by mouth daily.      Marland Kitchen omeprazole (PRILOSEC) 20 MG capsule Take 1 capsule (20 mg total) by mouth daily.  30 capsule  5  . Probiotic Product (PROBIOTIC DAILY PO) Take by mouth.       No current facility-administered medications on file prior to visit.    Review of Systems Patient denies any headache, lightheadedness or dizziness.  No significant sinus or allergy symptoms.   No increased cough or  congestion.  No increased sob reported.   Acid reflux better.  On omeprazole and ranitidine.  Acid reflux better.  No nausea.  No vomiting.   The abdominal pain has resolved.  No significant bowel change.   No BRBPR or melana.  No urine change.   Increased stress with her mother's health.  Desires no further intervention at this time.  Stress is better.        Objective:   Physical Exam  Filed Vitals:   04/28/13 1047  BP: 110/70  Pulse: 70  Temp: 98.3 F (82.1 C)   67 year old female in no acute distress.   HEENT:  Nares- clear.  Oropharynx - without lesions. NECK:  Supple.  Nontender.  No audible bruit.  HEART:  Appears to be regular. LUNGS:  No crackles or wheezing audible.  Respirations even and unlabored.  RADIAL PULSE:  Equal bilaterally.    BREASTS:  No nipple discharge or nipple retraction present.  Could not appreciate any distinct nodules or axillary adenopathy.  ABDOMEN:  Soft, nontender.  Bowel sounds present and normal.  No audible abdominal bruit.  GU:  Not performed.    EXTREMITIES:  No increased edema present.  DP pulses palpable and equal bilaterally.             Assessment & Plan:  CARDIOVASCULAR.  Recent stress test negative for ischemia.  Currently  doing well.  Feels better.    PULMONARY.  Recent cxr negative.  Doing well.    GYN.   Pap - normal.   HEALTH MAINTENANCE.  Physical today.  Mammogram 06/03/12- Birads II.  Colonoscopy 03/15/09 revealed diverticulosis - otherwise normal.  Schedule mammogram when due.  prevnar given today.

## 2013-05-01 NOTE — Assessment & Plan Note (Signed)
Colonoscopy 03/15/09 revealed diverticulosis - otherwise normal.  Found to be iron deficient.  Was started on iron.  Off now.  EGD 10/28/11 - hiatal hernia - otherwise normal.  Follow cbc and ferritin.    

## 2013-05-01 NOTE — Assessment & Plan Note (Signed)
Blood pressure under good control.  Same meds.  Follow metabolic panel.   

## 2013-05-01 NOTE — Assessment & Plan Note (Signed)
On thyroid placement.  Follow tsh.   

## 2013-05-01 NOTE — Assessment & Plan Note (Signed)
Not on cholesterol medicine.  Intolerant.  Low cholesterol diet and exercise.  Follow lipid panel.  Had muscle aches on simvastatin, pravastatin and Lipitor.

## 2013-05-01 NOTE — Assessment & Plan Note (Signed)
On omeprazole and ranitidine.  Symptoms improved.  Follow.  Has had EGD.     

## 2013-05-11 ENCOUNTER — Encounter: Payer: Self-pay | Admitting: Emergency Medicine

## 2013-05-11 ENCOUNTER — Other Ambulatory Visit (INDEPENDENT_AMBULATORY_CARE_PROVIDER_SITE_OTHER): Payer: Medicare Other

## 2013-05-11 DIAGNOSIS — E7800 Pure hypercholesterolemia, unspecified: Secondary | ICD-10-CM

## 2013-05-11 DIAGNOSIS — I10 Essential (primary) hypertension: Secondary | ICD-10-CM

## 2013-05-11 DIAGNOSIS — E039 Hypothyroidism, unspecified: Secondary | ICD-10-CM

## 2013-05-11 LAB — LIPID PANEL
Cholesterol: 248 mg/dL — ABNORMAL HIGH (ref 0–200)
Total CHOL/HDL Ratio: 4
Triglycerides: 87 mg/dL (ref 0.0–149.0)
VLDL: 17.4 mg/dL (ref 0.0–40.0)

## 2013-05-11 LAB — COMPREHENSIVE METABOLIC PANEL
ALT: 22 U/L (ref 0–35)
AST: 28 U/L (ref 0–37)
Creatinine, Ser: 0.7 mg/dL (ref 0.4–1.2)
Total Bilirubin: 0.6 mg/dL (ref 0.3–1.2)

## 2013-05-13 ENCOUNTER — Encounter: Payer: Self-pay | Admitting: Internal Medicine

## 2013-05-29 ENCOUNTER — Other Ambulatory Visit: Payer: Self-pay | Admitting: Internal Medicine

## 2013-06-06 ENCOUNTER — Ambulatory Visit: Payer: Self-pay | Admitting: Internal Medicine

## 2013-06-08 ENCOUNTER — Encounter: Payer: Self-pay | Admitting: Internal Medicine

## 2013-06-13 ENCOUNTER — Ambulatory Visit: Payer: Self-pay | Admitting: Internal Medicine

## 2013-06-13 LAB — HM MAMMOGRAPHY

## 2013-06-14 ENCOUNTER — Encounter: Payer: Self-pay | Admitting: Internal Medicine

## 2013-06-30 ENCOUNTER — Encounter: Payer: Self-pay | Admitting: Internal Medicine

## 2013-06-30 ENCOUNTER — Ambulatory Visit (INDEPENDENT_AMBULATORY_CARE_PROVIDER_SITE_OTHER): Payer: Medicare Other | Admitting: Internal Medicine

## 2013-06-30 VITALS — BP 120/70 | HR 80 | Temp 98.4°F | Ht 63.0 in | Wt 136.5 lb

## 2013-06-30 DIAGNOSIS — D649 Anemia, unspecified: Secondary | ICD-10-CM

## 2013-06-30 DIAGNOSIS — E7800 Pure hypercholesterolemia, unspecified: Secondary | ICD-10-CM

## 2013-06-30 DIAGNOSIS — I10 Essential (primary) hypertension: Secondary | ICD-10-CM

## 2013-06-30 NOTE — Progress Notes (Signed)
Pre-visit discussion using our clinic review tool. No additional management support is needed unless otherwise documented below in the visit note.  

## 2013-07-03 ENCOUNTER — Encounter: Payer: Self-pay | Admitting: Internal Medicine

## 2013-07-03 NOTE — Progress Notes (Signed)
Subjective:    Patient ID: Savannah Cox, female    DOB: 09/25/1945, 68 y.o.   MRN: [id number]  HPI 68 year old female with past history of hypertension, hypercholesterolemia, reoccurring allergy and sinus problems, hypothyroidism and anemia who comes in today for a scheduled follow up.   She is taking omeprazole and ranitidine now.  The acid reflux is better.  No abdominal pain or cramping now.  No vomiting.  No nausea.  She is eating and drinking.  No further chest discomfort or sob.  She did have her stress test.  Negative for ischemia.  No urinary symptoms  Still with increased stress.  Coping with her mother's medical issues.  Her mother was started on welbutrin.  Is doing better.  Stress is some better.  Desires no further intervention.  Has good support.  Overall feels better.  She has been watching what she eats and exercising.  Feels better.  Has lost some weight.  Discussed cholesterol medication.  Since starting her diet and exercise, she wants to hold on starting cholesterol medication.     Past Medical History  Diagnosis Date  . Hypertension   . Hypercholesterolemia   . Hypothyroidism     Current Outpatient Prescriptions on File Prior to Visit  Medication Sig Dispense Refill  . glucosamine-chondroitin 500-400 MG tablet Take 1 tablet by mouth daily.      Marland Kitchen levothyroxine (SYNTHROID, LEVOTHROID) 50 MCG tablet Take 1 tablet (50 mcg total) by mouth daily.  30 tablet  5  . lisinopril-hydrochlorothiazide (PRINZIDE,ZESTORETIC) 10-12.5 MG per tablet Take 1 tablet by mouth daily.  30 tablet  5  . loratadine (CLARITIN) 10 MG tablet Take 10 mg by mouth daily.      Marland Kitchen omeprazole (PRILOSEC) 20 MG capsule Take 1 capsule (20 mg total) by mouth daily.  30 capsule  5  . PREMPRO 0.3-1.5 MG per tablet TAKE 1 TABLET BY MOUTH DAILY.  28 tablet  3  . Probiotic Product (PROBIOTIC DAILY PO) Take by mouth.       No current facility-administered medications on file prior to visit.    Review of  Systems Patient denies any headache, lightheadedness or dizziness.  No significant sinus or allergy symptoms.   No increased cough or congestion.  No increased sob reported.   Acid reflux better.  On omeprazole and ranitidine.   No nausea.  No vomiting.   The abdominal pain has resolved.  No significant bowel change.   No BRBPR or melana.  No urine change.   Increased stress with her mother's health.  Desires no further intervention at this time.  Stress is better.   Exercising and watching her diet.  Feels better.  Has lost weight.       Objective:   Physical Exam  Filed Vitals:   06/30/13 1356  BP: 120/70  Pulse: 80  Temp: 98.4 F (57.31 C)   68 year old female in no acute distress.   HEENT:  Nares- clear.  Oropharynx - without lesions. NECK:  Supple.  Nontender.  No audible bruit.  HEART:  Appears to be regular. LUNGS:  No crackles or wheezing audible.  Respirations even and unlabored.  RADIAL PULSE:  Equal bilaterally.    ABDOMEN:  Soft, nontender.  Bowel sounds present and normal.  No audible abdominal bruit.     EXTREMITIES:  No increased edema present.  DP pulses palpable and equal bilaterally.             Assessment &  Plan:  CARDIOVASCULAR.  Recent stress test negative for ischemia.  Currently doing well.  Feels better.    PULMONARY.  Recent cxr negative.  Doing well.    GYN.   Pap - normal.   HEALTH MAINTENANCE.  Physical 04/28/13.   Just had her mammogram.  States checked out ok.  Recommended a 6 month follow up.   Colonoscopy 03/15/09 revealed diverticulosis - otherwise normal.

## 2013-07-03 NOTE — Assessment & Plan Note (Signed)
Blood pressure under good control.  Same meds.  Follow metabolic panel.

## 2013-07-03 NOTE — Assessment & Plan Note (Signed)
Not on cholesterol medicine.  Intolerant previously.    Low cholesterol diet and exercise.  Follow lipid panel.  Had muscle aches on simvastatin, pravastatin and Lipitor.   Discussed with her today regarding starting medication.  She declines.  States she has been exercising and monitoring her diet and has lost weight.  Feels better.  Wants to try to control without medications.  Follow.

## 2013-07-03 NOTE — Assessment & Plan Note (Signed)
Colonoscopy 03/15/09 revealed diverticulosis - otherwise normal.  Found to be iron deficient.  Was started on iron.  Off now.  EGD 10/28/11 - hiatal hernia - otherwise normal.  Follow cbc and ferritin.    

## 2013-07-22 ENCOUNTER — Telehealth: Payer: Self-pay | Admitting: Internal Medicine

## 2013-07-22 DIAGNOSIS — R928 Other abnormal and inconclusive findings on diagnostic imaging of breast: Secondary | ICD-10-CM

## 2013-07-22 NOTE — Telephone Encounter (Signed)
Order placed for 6 month f/u left breast mammo

## 2013-08-01 ENCOUNTER — Ambulatory Visit: Payer: Medicare Other | Admitting: Internal Medicine

## 2013-08-17 ENCOUNTER — Other Ambulatory Visit: Payer: Self-pay | Admitting: Internal Medicine

## 2013-09-07 ENCOUNTER — Telehealth: Payer: Self-pay | Admitting: Internal Medicine

## 2013-09-07 NOTE — Telephone Encounter (Signed)
My chart message sent to pt regarding her insurance denial for coverage for prempro.

## 2013-09-15 ENCOUNTER — Other Ambulatory Visit (INDEPENDENT_AMBULATORY_CARE_PROVIDER_SITE_OTHER): Payer: Medicare Other

## 2013-09-15 DIAGNOSIS — I10 Essential (primary) hypertension: Secondary | ICD-10-CM

## 2013-09-15 DIAGNOSIS — D649 Anemia, unspecified: Secondary | ICD-10-CM

## 2013-09-15 DIAGNOSIS — E7800 Pure hypercholesterolemia, unspecified: Secondary | ICD-10-CM

## 2013-09-15 LAB — LIPID PANEL
CHOLESTEROL: 249 mg/dL — AB (ref 0–200)
HDL: 68.7 mg/dL (ref >39.00)
LDL Cholesterol: 165 mg/dL — ABNORMAL HIGH (ref 0–99)
Total CHOL/HDL Ratio: 4
Triglycerides: 76 mg/dL (ref 0.0–149.0)
VLDL: 15.2 mg/dL (ref 0.0–40.0)

## 2013-09-15 LAB — CBC WITH DIFFERENTIAL/PLATELET
BASOS ABS: 0 10*3/uL (ref 0.0–0.1)
Basophils Relative: 0.9 % (ref 0.0–3.0)
EOS ABS: 0.2 10*3/uL (ref 0.0–0.7)
Eosinophils Relative: 3.7 % (ref 0.0–5.0)
HEMATOCRIT: 39 % (ref 36.0–46.0)
HEMOGLOBIN: 13.2 g/dL (ref 12.0–15.0)
LYMPHS ABS: 1.5 10*3/uL (ref 0.7–4.0)
Lymphocytes Relative: 28.6 % (ref 12.0–46.0)
MCHC: 33.8 g/dL (ref 30.0–36.0)
MCV: 96.7 fl (ref 78.0–100.0)
Monocytes Absolute: 0.6 10*3/uL (ref 0.1–1.0)
Monocytes Relative: 12.2 % — ABNORMAL HIGH (ref 3.0–12.0)
NEUTROS ABS: 2.8 10*3/uL (ref 1.4–7.7)
Neutrophils Relative %: 54.6 % (ref 43.0–77.0)
PLATELETS: 267 10*3/uL (ref 150.0–400.0)
RBC: 4.03 Mil/uL (ref 3.87–5.11)
RDW: 13.9 % (ref 11.5–14.6)
WBC: 5.1 10*3/uL (ref 4.5–10.5)

## 2013-09-15 LAB — COMPREHENSIVE METABOLIC PANEL
ALBUMIN: 3.8 g/dL (ref 3.5–5.2)
ALT: 21 U/L (ref 0–35)
AST: 28 U/L (ref 0–37)
Alkaline Phosphatase: 49 U/L (ref 39–117)
BILIRUBIN TOTAL: 0.6 mg/dL (ref 0.3–1.2)
BUN: 18 mg/dL (ref 6–23)
CO2: 28 mEq/L (ref 19–32)
Calcium: 9.1 mg/dL (ref 8.4–10.5)
Chloride: 105 mEq/L (ref 96–112)
Creatinine, Ser: 0.8 mg/dL (ref 0.4–1.2)
GFR: 79.76 mL/min (ref >60.00)
GLUCOSE: 93 mg/dL (ref 70–99)
POTASSIUM: 3.7 meq/L (ref 3.5–5.1)
Sodium: 139 mEq/L (ref 135–145)
Total Protein: 6.7 g/dL (ref 6.0–8.3)

## 2013-09-19 ENCOUNTER — Encounter: Payer: Self-pay | Admitting: Internal Medicine

## 2013-09-20 ENCOUNTER — Ambulatory Visit (INDEPENDENT_AMBULATORY_CARE_PROVIDER_SITE_OTHER): Payer: Medicare Other | Admitting: Internal Medicine

## 2013-09-20 ENCOUNTER — Other Ambulatory Visit: Payer: Self-pay | Admitting: Internal Medicine

## 2013-09-20 ENCOUNTER — Encounter: Payer: Self-pay | Admitting: Internal Medicine

## 2013-09-20 VITALS — BP 110/70 | HR 81 | Temp 98.2°F | Ht 63.0 in | Wt 129.0 lb

## 2013-09-20 DIAGNOSIS — E039 Hypothyroidism, unspecified: Secondary | ICD-10-CM

## 2013-09-20 DIAGNOSIS — D649 Anemia, unspecified: Secondary | ICD-10-CM

## 2013-09-20 DIAGNOSIS — R319 Hematuria, unspecified: Secondary | ICD-10-CM

## 2013-09-20 DIAGNOSIS — R928 Other abnormal and inconclusive findings on diagnostic imaging of breast: Secondary | ICD-10-CM

## 2013-09-20 DIAGNOSIS — Z79899 Other long term (current) drug therapy: Secondary | ICD-10-CM

## 2013-09-20 DIAGNOSIS — R109 Unspecified abdominal pain: Secondary | ICD-10-CM

## 2013-09-20 DIAGNOSIS — I10 Essential (primary) hypertension: Secondary | ICD-10-CM

## 2013-09-20 DIAGNOSIS — R0602 Shortness of breath: Secondary | ICD-10-CM

## 2013-09-20 DIAGNOSIS — K219 Gastro-esophageal reflux disease without esophagitis: Secondary | ICD-10-CM

## 2013-09-20 DIAGNOSIS — E7800 Pure hypercholesterolemia, unspecified: Secondary | ICD-10-CM

## 2013-09-20 MED ORDER — BETAMETHASONE DIPROPIONATE 0.05 % EX CREA: TOPICAL_CREAM | CUTANEOUS | Status: DC

## 2013-09-20 NOTE — Progress Notes (Signed)
Subjective:    Patient ID: Savannah Cox, female    DOB: May 13, 1946, 68 y.o.   MRN: [id number]  HPI 68 year old female with past history of hypertension, hypercholesterolemia, reoccurring allergy and sinus problems, hypothyroidism and anemia who comes in today for a scheduled follow up.   She is taking omeprazole and ranitidine now. The acid reflux is better.  No abdominal pain or cramping now.  No vomiting.  No nausea.  She is eating and drinking.  No further chest discomfort or sob.  She did have her stress test.  Negative for ischemia.  No urinary symptoms  Still with increased stress.  Coping with her mother's medical issues.  She feels she is handling stress relatively well.  Desires no further intervention.  Has good support.  Overall feels better.  She has been watching what she eats and exercising.  Feels better.  Has lost some weight.  Discussed cholesterol medication.  Since cholesterol still elevated, she is willing to try crestor.       Past Medical History  Diagnosis Date  . Hypertension   . Hypercholesterolemia   . Hypothyroidism     Current Outpatient Prescriptions on File Prior to Visit  Medication Sig Dispense Refill  . glucosamine-chondroitin 500-400 MG tablet Take 1 tablet by mouth daily.      Marland Kitchen levothyroxine (SYNTHROID, LEVOTHROID) 50 MCG tablet TAKE 1 TABLET BY MOUTH EVERY DAY  30 tablet  8  . lisinopril-hydrochlorothiazide (PRINZIDE,ZESTORETIC) 10-12.5 MG per tablet Take 1 tablet by mouth daily.  30 tablet  5  . loratadine (CLARITIN) 10 MG tablet Take 10 mg by mouth daily.      Marland Kitchen omeprazole (PRILOSEC) 20 MG capsule TAKE 1 CAPSULE BY MOUTH DAILY.  30 capsule  5  . Probiotic Product (PROBIOTIC DAILY PO) Take by mouth.       No current facility-administered medications on file prior to visit.    Review of Systems Patient denies any headache, lightheadedness or dizziness.  No significant sinus or allergy symptoms.   No increased cough or congestion.  No increased sob.   Acid reflux better.  On omeprazole and ranitidine.   No nausea.  No vomiting.   The abdominal pain has resolved.  No significant bowel change.   No BRBPR or melana.  No urine change.   Increased stress with her mother's health.  Desires no further intervention at this time.  Exercising and watching her diet.  Feels better.  Has lost weight.  Cholesterol still elevated.  Discussed starting cholesterol medication.        Objective:   Physical Exam  Filed Vitals:   09/20/13 0904  BP: 110/70  Pulse: 81  Temp: 98.2 F (22.12 C)   68 year old female in no acute distress.   HEENT:  Nares- clear.  Oropharynx - without lesions. NECK:  Supple.  Nontender.  No audible bruit.  HEART:  Appears to be regular. LUNGS:  No crackles or wheezing audible.  Respirations even and unlabored.  RADIAL PULSE:  Equal bilaterally.    ABDOMEN:  Soft, nontender.  Bowel sounds present and normal.  No audible abdominal bruit.     EXTREMITIES:  No increased edema present.  DP pulses palpable and equal bilaterally.             Assessment & Plan:  CARDIOVASCULAR.  Recent stress test negative for ischemia.  Currently doing well.  Feels better.    PULMONARY.  Recent cxr negative.  Doing well.  GYN.   Pap - normal.   HEALTH MAINTENANCE.  Physical 04/28/13.   Mammogram 06/13/13 - recommended a 6 month follow up left breast.  Schedule f/u left breast mammogram.   Colonoscopy 03/15/09 revealed diverticulosis - otherwise normal.

## 2013-09-20 NOTE — Progress Notes (Signed)
Pre-visit discussion using our clinic review tool. No additional management support is needed unless otherwise documented below in the visit note.  

## 2013-09-21 NOTE — Telephone Encounter (Signed)
Ok to send Rx 

## 2013-09-21 NOTE — Telephone Encounter (Signed)
Refilled prempro #30 with 3 refills.

## 2013-09-25 ENCOUNTER — Encounter: Payer: Self-pay | Admitting: Internal Medicine

## 2013-09-25 DIAGNOSIS — R928 Other abnormal and inconclusive findings on diagnostic imaging of breast: Secondary | ICD-10-CM | POA: Insufficient documentation

## 2013-09-25 NOTE — Assessment & Plan Note (Signed)
Trying to taper off estrogen.  Insurance not covering.  Need to see if can get coverage, so she will have enough to taper off the medication.  Slow taper.

## 2013-09-25 NOTE — Assessment & Plan Note (Signed)
On thyroid placement.  Follow tsh.   

## 2013-09-25 NOTE — Assessment & Plan Note (Signed)
Recent stress test negative for ischemia.  Check cxr negative.  She feels she is doing well.  Follow.

## 2013-09-25 NOTE — Assessment & Plan Note (Signed)
On omeprazole and ranitidine.  Symptoms improved.  Follow.  Has had EGD.     

## 2013-09-25 NOTE — Assessment & Plan Note (Signed)
Blood pressure under good control.  Same meds.  Follow metabolic panel.

## 2013-09-25 NOTE — Assessment & Plan Note (Signed)
Mammogram 06/13/13 recommended f/u 6 month left mammo.  Schedule a left breast mammo in 6 months.

## 2013-09-25 NOTE — Assessment & Plan Note (Signed)
See report for recent CT scan.  Pain/discomfort resolved.    

## 2013-09-25 NOTE — Assessment & Plan Note (Signed)
Low cholesterol diet and exercise.  Follow lipid panel.  Had muscle aches on simvastatin, pravastatin and Lipitor.   Discussed with her today regarding starting medication.  She agreed to crestor.  Samples of 20mg  of crestor given.  She was instructed to take 1/2 tablet (20mg ) two days a week.  Check liver panel in 6 weeks.

## 2013-09-25 NOTE — Assessment & Plan Note (Addendum)
Worked up by urology.  Had negative CT except for left renal cyst.  Negative cystoscopy.  Urology felt no further w/up warranted.  Urinalysis 4/14 revealed 3-6 rbc's.  Follow.  She desires no referral back to urology.  Recent CT - hypodensity - kidney.  See report.  Will need f/u urinalysis.  Urology recommended yearly urinalysis.

## 2013-09-25 NOTE — Assessment & Plan Note (Signed)
Colonoscopy 03/15/09 revealed diverticulosis - otherwise normal.  Found to be iron deficient.  Was started on iron.  Off now.  EGD 10/28/11 - hiatal hernia - otherwise normal.  Follow cbc and ferritin.    

## 2013-09-29 ENCOUNTER — Telehealth: Payer: Self-pay | Admitting: *Deleted

## 2013-09-29 NOTE — Telephone Encounter (Signed)
PA request form for the Prempro placed in Dr.scott folder

## 2013-10-13 NOTE — Telephone Encounter (Signed)
PA request form was faxed over

## 2013-10-17 ENCOUNTER — Encounter: Payer: Self-pay | Admitting: *Deleted

## 2013-11-01 ENCOUNTER — Other Ambulatory Visit (INDEPENDENT_AMBULATORY_CARE_PROVIDER_SITE_OTHER): Payer: Medicare Other

## 2013-11-01 DIAGNOSIS — R319 Hematuria, unspecified: Secondary | ICD-10-CM

## 2013-11-01 DIAGNOSIS — E7800 Pure hypercholesterolemia, unspecified: Secondary | ICD-10-CM

## 2013-11-01 LAB — URINALYSIS, ROUTINE W REFLEX MICROSCOPIC
Bilirubin Urine: NEGATIVE
KETONES UR: NEGATIVE
Leukocytes, UA: NEGATIVE
Nitrite: NEGATIVE
Specific Gravity, Urine: 1.01 (ref 1.000–1.030)
TOTAL PROTEIN, URINE-UPE24: NEGATIVE
URINE GLUCOSE: NEGATIVE
Urobilinogen, UA: 0.2 (ref 0.0–1.0)
WBC, UA: NONE SEEN (ref 0–?)
pH: 7 (ref 5.0–8.0)

## 2013-11-01 LAB — HEPATIC FUNCTION PANEL
ALK PHOS: 45 U/L (ref 39–117)
ALT: 24 U/L (ref 0–35)
AST: 30 U/L (ref 0–37)
Albumin: 3.7 g/dL (ref 3.5–5.2)
BILIRUBIN TOTAL: 0.3 mg/dL (ref 0.2–1.2)
Bilirubin, Direct: 0 mg/dL (ref 0.0–0.3)
TOTAL PROTEIN: 6.6 g/dL (ref 6.0–8.3)

## 2013-11-10 ENCOUNTER — Encounter: Payer: Self-pay | Admitting: Internal Medicine

## 2013-11-21 ENCOUNTER — Telehealth: Payer: Self-pay | Admitting: Internal Medicine

## 2013-11-21 ENCOUNTER — Encounter: Payer: Self-pay | Admitting: Internal Medicine

## 2013-11-21 DIAGNOSIS — R928 Other abnormal and inconclusive findings on diagnostic imaging of breast: Secondary | ICD-10-CM

## 2013-11-21 NOTE — Telephone Encounter (Signed)
Order placed for 6 month f/u diagnostic left breast mammo with magnification views of calcifications.  Pt notified of order being placed via my chart.

## 2013-12-10 ENCOUNTER — Other Ambulatory Visit: Payer: Self-pay | Admitting: Internal Medicine

## 2014-01-24 ENCOUNTER — Ambulatory Visit (INDEPENDENT_AMBULATORY_CARE_PROVIDER_SITE_OTHER): Payer: Medicare Other | Admitting: Internal Medicine

## 2014-01-24 ENCOUNTER — Encounter: Payer: Self-pay | Admitting: Internal Medicine

## 2014-01-24 VITALS — BP 120/70 | HR 64 | Temp 98.1°F | Ht 63.0 in | Wt 127.0 lb

## 2014-01-24 DIAGNOSIS — K219 Gastro-esophageal reflux disease without esophagitis: Secondary | ICD-10-CM

## 2014-01-24 DIAGNOSIS — D649 Anemia, unspecified: Secondary | ICD-10-CM

## 2014-01-24 DIAGNOSIS — R319 Hematuria, unspecified: Secondary | ICD-10-CM

## 2014-01-24 DIAGNOSIS — R1084 Generalized abdominal pain: Secondary | ICD-10-CM

## 2014-01-24 DIAGNOSIS — R928 Other abnormal and inconclusive findings on diagnostic imaging of breast: Secondary | ICD-10-CM

## 2014-01-24 DIAGNOSIS — Z79899 Other long term (current) drug therapy: Secondary | ICD-10-CM

## 2014-01-24 DIAGNOSIS — I10 Essential (primary) hypertension: Secondary | ICD-10-CM

## 2014-01-24 DIAGNOSIS — E039 Hypothyroidism, unspecified: Secondary | ICD-10-CM

## 2014-01-24 DIAGNOSIS — E7800 Pure hypercholesterolemia, unspecified: Secondary | ICD-10-CM

## 2014-01-24 MED ORDER — SERTRALINE HCL 50 MG PO TABS: 50.0000 mg | ORAL_TABLET | Freq: Every day | ORAL | Status: DC

## 2014-01-24 NOTE — Patient Instructions (Signed)
Take zoloft 50mg  1/2 tablet per day for the first week and then one tablet per day

## 2014-01-24 NOTE — Progress Notes (Signed)
Pre visit review using our clinic review tool, if applicable. No additional management support is needed unless otherwise documented below in the visit note. 

## 2014-01-29 ENCOUNTER — Encounter: Payer: Self-pay | Admitting: Internal Medicine

## 2014-01-29 NOTE — Assessment & Plan Note (Signed)
Colonoscopy 03/15/09 revealed diverticulosis - otherwise normal.  Found to be iron deficient.  Was started on iron.  Off now.  EGD 10/28/11 - hiatal hernia - otherwise normal.  Follow cbc and ferritin.

## 2014-01-29 NOTE — Progress Notes (Signed)
Subjective:    Patient ID: Savannah Cox, female    DOB: 1946/03/03, 68 y.o.   MRN: [id number]  HPI 68 year old female with past history of hypertension, hypercholesterolemia, reoccurring allergy and sinus problems, hypothyroidism and anemia who comes in today for a scheduled follow up.   She is taking omeprazole and ranitidine now. The acid reflux is better.  No abdominal pain or cramping now.  No vomiting.  No nausea.  She is eating and drinking.  No further chest discomfort or sob.  She did have her stress test.  Negative for ischemia.  No urinary symptoms  Was following with Dr Bernardo Heater.  Planning to transfer to Dr Elnoria Howard, since he is in town.  Still with increased stress.  Coping with her mother's medical issues.  She feels she is handling stress relatively well.  Desires no further intervention.  Has good support.  Overall feels better.  She has been watching what she eats and exercising.  We discussed tapering off her prempro.       Past Medical History  Diagnosis Date  . Hypertension   . Hypercholesterolemia   . Hypothyroidism     Current Outpatient Prescriptions on File Prior to Visit  Medication Sig Dispense Refill  . betamethasone dipropionate (DIPROLENE) 0.05 % cream Use bid prn  30 g  0  . glucosamine-chondroitin 500-400 MG tablet Take 1 tablet by mouth daily.      Marland Kitchen levothyroxine (SYNTHROID, LEVOTHROID) 50 MCG tablet TAKE 1 TABLET BY MOUTH EVERY DAY  30 tablet  8  . lisinopril-hydrochlorothiazide (PRINZIDE,ZESTORETIC) 10-12.5 MG per tablet TAKE 1 TABLET BY MOUTH DAILY.  30 tablet  5  . loratadine (CLARITIN) 10 MG tablet Take 10 mg by mouth daily.      Marland Kitchen omeprazole (PRILOSEC) 20 MG capsule TAKE 1 CAPSULE BY MOUTH DAILY.  30 capsule  5  . PREMPRO 0.3-1.5 MG per tablet TAKE 1 TABLET BY MOUTH EVERY DAY  30 tablet  3  . Probiotic Product (PROBIOTIC DAILY PO) Take by mouth.       No current facility-administered medications on file prior to visit.    Review of Systems Patient  denies any headache, lightheadedness or dizziness.  No significant sinus or allergy symptoms.   No increased cough or congestion.  No increased sob.  Acid reflux better.  On omeprazole and ranitidine.   No nausea.  No vomiting.   The abdominal pain has resolved.  No significant bowel change.   No BRBPR or melana.  No urine change.   Increased stress with her mother's health.  Desires no further intervention at this time.  Exercising and watching her diet.  Feels better.        Objective:   Physical Exam  Filed Vitals:   01/24/14 0918  BP: 120/70  Pulse: 64  Temp: 98.1 F (36.7 C)   Blood pressure recheck:  89/38  68 year old female in no acute distress.   HEENT:  Nares- clear.  Oropharynx - without lesions. NECK:  Supple.  Nontender.  No audible bruit.  HEART:  Appears to be regular. LUNGS:  No crackles or wheezing audible.  Respirations even and unlabored.  RADIAL PULSE:  Equal bilaterally.    ABDOMEN:  Soft, nontender.  Bowel sounds present and normal.  No audible abdominal bruit.     EXTREMITIES:  No increased edema present.  DP pulses palpable and equal bilaterally.             Assessment &  Plan:  CARDIOVASCULAR.  Recent stress test negative for ischemia.  Currently doing well.  Feels better.    PULMONARY.  Recent cxr negative.  Doing well.    GYN.   Pap - normal.   HEALTH MAINTENANCE.  Physical 04/28/13.   Mammogram 06/13/13 - recommended a 6 month follow up left breast.  Still has not had.   Schedule f/u left breast mammogram.   Colonoscopy 03/15/09 revealed diverticulosis - otherwise normal.

## 2014-01-29 NOTE — Assessment & Plan Note (Signed)
Worked up by urology.  Had negative CT except for left renal cyst.  Negative cystoscopy.   Urinalysis 5/15 revealed 3-6 rbc's.  Follow.   Recent CT - hypodensity - kidney.  See report.  Will need f/u urinalysis.  Urology recommended yearly urinalysis.  Plans to follow up with urology here in town (Dr Elnoria Howard).

## 2014-01-29 NOTE — Assessment & Plan Note (Signed)
Blood pressure under good control.  Same meds.  Follow metabolic panel.

## 2014-01-29 NOTE — Assessment & Plan Note (Addendum)
Low cholesterol diet and exercise.  Follow lipid panel.  Had muscle aches on simvastatin, pravastatin and Lipitor.   Does not want to start cholesterol medication.  Wants to continue to work on diet and exercise.  Will follow.

## 2014-01-29 NOTE — Assessment & Plan Note (Signed)
On omeprazole and ranitidine.  Symptoms improved.  Follow.  Has had EGD.

## 2014-01-29 NOTE — Assessment & Plan Note (Signed)
Mammogram 06/13/13 recommended f/u 6 month left mammo.  Schedule a left breast mammo in 6 months.  Still has not had.

## 2014-01-29 NOTE — Assessment & Plan Note (Signed)
See report for recent CT scan.  Pain/discomfort resolved.

## 2014-01-29 NOTE — Assessment & Plan Note (Signed)
Discussed tapering off estrogen.  Slow taper.

## 2014-01-29 NOTE — Assessment & Plan Note (Signed)
On thyroid placement.  Follow tsh.   

## 2014-02-07 ENCOUNTER — Other Ambulatory Visit: Payer: Self-pay | Admitting: Internal Medicine

## 2014-02-13 ENCOUNTER — Ambulatory Visit (INDEPENDENT_AMBULATORY_CARE_PROVIDER_SITE_OTHER): Payer: Medicare Other | Admitting: Adult Health

## 2014-02-13 ENCOUNTER — Encounter: Payer: Self-pay | Admitting: Adult Health

## 2014-02-13 DIAGNOSIS — Z0000 Encounter for general adult medical examination without abnormal findings: Secondary | ICD-10-CM

## 2014-02-13 DIAGNOSIS — Z13820 Encounter for screening for osteoporosis: Secondary | ICD-10-CM

## 2014-02-13 DIAGNOSIS — Z23 Encounter for immunization: Secondary | ICD-10-CM

## 2014-02-13 NOTE — Progress Notes (Signed)
Pre visit review using our clinic review tool, if applicable. No additional management support is needed unless otherwise documented below in the visit note. 

## 2014-02-13 NOTE — Progress Notes (Signed)
Subjective:    Savannah Cox is a 68 y.o. female who presents for Medicare Annual/Subsequent preventive examination.  Preventive Screening-Counseling & Management  Tobacco History  Smoking status  . Former Smoker  Smokeless tobacco  . Never Used     Problems Prior to Visit    Current Problems (verified) Patient Active Problem List   Diagnosis Date Noted  . Abnormal mammogram 09/25/2013  . Current use of estrogen therapy 09/25/2013  . Abdominal pain 02/22/2013  . SOB (shortness of breath) on exertion 02/22/2013  . Anemia 05/01/2012  . GERD (gastroesophageal reflux disease) 05/01/2012  . Hematuria 05/01/2012  . Hypothyroidism 05/01/2012  . Hypercholesterolemia 05/01/2012  . Hypertension 05/01/2012    Medications Prior to Visit Current Outpatient Prescriptions on File Prior to Visit  Medication Sig Dispense Refill  . glucosamine-chondroitin 500-400 MG tablet Take 1 tablet by mouth daily.      Marland Kitchen levothyroxine (SYNTHROID, LEVOTHROID) 50 MCG tablet TAKE 1 TABLET BY MOUTH EVERY DAY  30 tablet  8  . lisinopril-hydrochlorothiazide (PRINZIDE,ZESTORETIC) 10-12.5 MG per tablet TAKE 1 TABLET BY MOUTH DAILY.  30 tablet  5  . loratadine (CLARITIN) 10 MG tablet Take 10 mg by mouth daily.      Marland Kitchen omeprazole (PRILOSEC) 20 MG capsule TAKE 1 CAPSULE BY MOUTH DAILY.  30 capsule  5  . Probiotic Product (PROBIOTIC DAILY PO) Take by mouth.      . sertraline (ZOLOFT) 50 MG tablet Take 1 tablet (50 mg total) by mouth daily.  30 tablet  1   No current facility-administered medications on file prior to visit.    Current Medications (verified) Current Outpatient Prescriptions  Medication Sig Dispense Refill  . estrogen, conjugated,-medroxyprogesterone (PREMPRO) 0.3-1.5 MG per tablet Take 1 table twice a week      . glucosamine-chondroitin 500-400 MG tablet Take 1 tablet by mouth daily.      Marland Kitchen levothyroxine (SYNTHROID, LEVOTHROID) 50 MCG tablet TAKE 1 TABLET BY MOUTH EVERY DAY  30 tablet  8   . lisinopril-hydrochlorothiazide (PRINZIDE,ZESTORETIC) 10-12.5 MG per tablet TAKE 1 TABLET BY MOUTH DAILY.  30 tablet  5  . loratadine (CLARITIN) 10 MG tablet Take 10 mg by mouth daily.      Marland Kitchen omeprazole (PRILOSEC) 20 MG capsule TAKE 1 CAPSULE BY MOUTH DAILY.  30 capsule  5  . Probiotic Product (PROBIOTIC DAILY PO) Take by mouth.      . sertraline (ZOLOFT) 50 MG tablet Take 1 tablet (50 mg total) by mouth daily.  30 tablet  1   No current facility-administered medications for this visit.     Allergies (verified) Augmentin   PAST HISTORY  Family History Family History  Problem Relation Age of Onset  . Heart disease Father     died age 9 - myocardial infarction  . Hypercholesterolemia Mother   . Arthritis Mother   . Breast cancer      aunt  . Kidney cancer      aunt  . Colon cancer Neg Hx     Social History History  Substance Use Topics  . Smoking status: Former Research scientist (life sciences)  . Smokeless tobacco: Never Used  . Alcohol Use: Yes     Comment: Rare drink     Are there smokers in your home (other than you)? No  Risk Factors Current exercise habits: Gym/ health club routine includes treadmill and Silver Sneakers twice weekly; treadmill 5 days a week.  Dietary issues discussed: Follows healthy meal planning. Lean protein, fruits, vegetables  Cardiac risk factors: advanced age (older than 16 for men, 72 for women), dyslipidemia and hypertension.  Depression Screen (Note: if answer to either of the following is "Yes", a more complete depression screening is indicated)   Over the past two weeks, have you felt down, depressed or hopeless? No  Over the past two weeks, have you felt little interest or pleasure in doing things? No  Have you lost interest or pleasure in daily life? No  Do you often feel hopeless? No  Do you cry easily over simple problems? No  Activities of Daily Living In your present state of health, do you have any difficulty performing the following  activities?:  Driving? No Managing money?  No Feeding yourself? No Getting from bed to chair? NoNo exam performed today, medicare wellness. Climbing a flight of stairs? No Preparing food and eating?: No Bathing or showering? No Getting dressed: No Getting to the toilet? No Using the toilet:No Moving around from place to place: No In the past year have you fallen or had a near fall?:No   Are you sexually active?  Yes  Do you have more than one partner?  No  Hearing Difficulties: No Do you often ask people to speak up or repeat themselves? No Do you experience ringing or noises in your ears? No Do you have difficulty understanding soft or whispered voices? No   Do you feel that you have a problem with memory? No  Do you often misplace items? No  Do you feel safe at home?  Yes  Cognitive Testing  Alert? Yes  Normal Appearance?Yes  Oriented to person? Yes  Place? Yes   Time? Yes  Recall of three objects?  Yes  Can perform simple calculations? Yes  Displays appropriate judgment?Yes  Can read the correct time from a watch face?Yes   Advanced Directives have been discussed with the patient? Yes  List the Names of Other Physician/Practitioners you currently use: 1.  Dr. Bernardo Heater - Urology 2.  Dr. Thomasene Ripple - Ophthalmologist  3.  Dr. Candace Cruise - GI  Indicate any recent Medical Services you may have received from other than [facility] providers in the past year (date may be approximate).  Immunization History  Administered Date(s) Administered  . DTaP 12/09/2010  . Influenza Split 04/29/2012  . Influenza,inj,Quad PF,36+ Mos 02/21/2013  . Pneumococcal Conjugate-13 04/28/2013  . Zoster 10/12/2011    Screening Tests Health Maintenance  Topic Date Due  . Pneumococcal Polysaccharide Vaccine Age 105 And Over  11/28/2012  . Influenza Vaccine  01/14/2014  . Mammogram  06/14/2015  . Tetanus/tdap  12/08/2020  . Colonoscopy  10/11/2021  . Zostavax  Completed    All answers were reviewed  with the patient and necessary referrals were made:  Rey,Raquel, NP   02/13/2014   History reviewed: allergies, current medications, past family history, past medical history, past social history, past surgical history and problem list  Review of Systems No ROS done. Medicare Wellness    Objective:     Vision by Snellen chart: right eye:20/20, left eye:20/30. Has appt with Dr. Holley Bouche Sept 17, 2015  There is no weight on file to calculate BMI. There were no vitals taken for this visit.  No exam performed today, Medicare Wellness.     Assessment:      This is a routine wellness  examination for this patient . I reviewed all health maintenance protocols including mammography, colonoscopy, bone density Needed referrals were placed. Age and diagnosis  appropriate screening labs  were ordered. Her immunization history was reviewed and appropriate vaccinations were ordered. Her current medications and allergies were reviewed and needed refills of her chronic medications were ordered. The plan for yearly health maintenance was discussed all orders and referrals were made as appropriate.      Plan:     During the course of the visit the patient was educated and counseled about appropriate screening and preventive services including:    Influenza vaccine  Screening mammography  Bone densitometry screening  Advanced directives: has NO advanced directive - not interested in additional information  Will be given flu vaccine today Appt for Mammogram in Sept 2015 at [facility] Bone density Discussed advanced directives. She will look into this. Pneumonia Vaccine will also be given today  Diet review for nutrition referral? Yes ____  Not Indicated __x__   Patient Instructions (the written plan) was given to the patient.  Medicare Attestation I have personally reviewed: The patient's medical and social history Their use of alcohol, tobacco or illicit drugs Their current  medications and supplements The patient's functional ability including ADLs,fall risks, home safety risks, cognitive, and hearing and visual impairment Diet and physical activities Evidence for depression or mood disorders  The patient's weight, height, BMI, and visual acuity have been recorded in the chart.  I have made referrals, counseling, and provided education to the patient based on review of the above and I have provided the patient with a written personalized care plan for preventive services.     Rey,Raquel, NP   02/13/2014

## 2014-02-13 NOTE — Patient Instructions (Signed)
  You had your Medicare Wellness Screening today.  The following vaccines were administered:   Pneumovax - pneumonia  Flu vaccine  Please see Hoyle Sauer prior to leaving the office to schedule your bond density test.  Mammogram appointment in September  Eye exam also in September.  Follow up with Dr. Nicki Reaper in October.

## 2014-02-21 ENCOUNTER — Ambulatory Visit: Payer: Self-pay | Admitting: Adult Health

## 2014-02-22 ENCOUNTER — Encounter: Payer: Self-pay | Admitting: Internal Medicine

## 2014-02-22 ENCOUNTER — Ambulatory Visit: Payer: Self-pay | Admitting: Internal Medicine

## 2014-02-22 ENCOUNTER — Other Ambulatory Visit (INDEPENDENT_AMBULATORY_CARE_PROVIDER_SITE_OTHER): Payer: Medicare Other

## 2014-02-22 DIAGNOSIS — I10 Essential (primary) hypertension: Secondary | ICD-10-CM

## 2014-02-22 DIAGNOSIS — E7800 Pure hypercholesterolemia, unspecified: Secondary | ICD-10-CM

## 2014-02-22 LAB — LIPID PANEL
Cholesterol: 239 mg/dL — ABNORMAL HIGH (ref 0–200)
HDL: 66.3 mg/dL (ref >39.00)
LDL Cholesterol: 156 mg/dL — ABNORMAL HIGH (ref 0–99)
NONHDL: 172.7
Total CHOL/HDL Ratio: 4
Triglycerides: 82 mg/dL (ref 0.0–149.0)
VLDL: 16.4 mg/dL (ref 0.0–40.0)

## 2014-02-22 LAB — BASIC METABOLIC PANEL
BUN: 14 mg/dL (ref 6–23)
CO2: 25 mEq/L (ref 19–32)
Calcium: 9.4 mg/dL (ref 8.4–10.5)
Chloride: 106 mEq/L (ref 96–112)
Creatinine, Ser: 0.7 mg/dL (ref 0.4–1.2)
GFR: 84.71 mL/min (ref >60.00)
Glucose, Bld: 90 mg/dL (ref 70–99)
POTASSIUM: 3.7 meq/L (ref 3.5–5.1)
SODIUM: 140 meq/L (ref 135–145)

## 2014-02-22 LAB — HEPATIC FUNCTION PANEL
ALT: 36 U/L — ABNORMAL HIGH (ref 0–35)
AST: 44 U/L — ABNORMAL HIGH (ref 0–37)
Albumin: 3.8 g/dL (ref 3.5–5.2)
Alkaline Phosphatase: 58 U/L (ref 39–117)
BILIRUBIN TOTAL: 0.5 mg/dL (ref 0.2–1.2)
Bilirubin, Direct: 0 mg/dL (ref 0.0–0.3)
Total Protein: 7 g/dL (ref 6.0–8.3)

## 2014-02-22 LAB — HM MAMMOGRAPHY

## 2014-02-23 ENCOUNTER — Encounter: Payer: Self-pay | Admitting: *Deleted

## 2014-02-24 ENCOUNTER — Encounter: Payer: Self-pay | Admitting: Internal Medicine

## 2014-02-28 ENCOUNTER — Telehealth: Payer: Self-pay | Admitting: *Deleted

## 2014-02-28 NOTE — Telephone Encounter (Signed)
Pt is coming in tomorrow what labs and dx?  

## 2014-03-01 ENCOUNTER — Other Ambulatory Visit (INDEPENDENT_AMBULATORY_CARE_PROVIDER_SITE_OTHER): Payer: Medicare Other

## 2014-03-01 ENCOUNTER — Other Ambulatory Visit: Payer: Self-pay | Admitting: Internal Medicine

## 2014-03-01 DIAGNOSIS — E039 Hypothyroidism, unspecified: Secondary | ICD-10-CM

## 2014-03-01 DIAGNOSIS — R945 Abnormal results of liver function studies: Secondary | ICD-10-CM

## 2014-03-01 DIAGNOSIS — R7989 Other specified abnormal findings of blood chemistry: Secondary | ICD-10-CM

## 2014-03-01 NOTE — Progress Notes (Signed)
Orders placed for labs

## 2014-03-02 ENCOUNTER — Telehealth: Payer: Self-pay | Admitting: Adult Health

## 2014-03-02 LAB — HEPATIC FUNCTION PANEL
ALK PHOS: 65 U/L (ref 39–117)
ALT: 45 U/L — ABNORMAL HIGH (ref 0–35)
AST: 53 U/L — AB (ref 0–37)
Albumin: 4.2 g/dL (ref 3.5–5.2)
BILIRUBIN DIRECT: 0.1 mg/dL (ref 0.0–0.3)
TOTAL PROTEIN: 7.5 g/dL (ref 6.0–8.3)
Total Bilirubin: 0.4 mg/dL (ref 0.2–1.2)

## 2014-03-02 LAB — TSH: TSH: 2.86 u[IU]/mL (ref 0.35–4.50)

## 2014-03-02 NOTE — Telephone Encounter (Signed)
Osteopenia. Recommend calcium 600 mg bid and vitamin d3 2000 units daily plus weight bearing exercises. Repeat test in 2017

## 2014-03-06 ENCOUNTER — Other Ambulatory Visit: Payer: Self-pay | Admitting: Internal Medicine

## 2014-03-06 ENCOUNTER — Encounter: Payer: Self-pay | Admitting: Internal Medicine

## 2014-03-06 DIAGNOSIS — R7989 Other specified abnormal findings of blood chemistry: Secondary | ICD-10-CM

## 2014-03-06 DIAGNOSIS — R945 Abnormal results of liver function studies: Secondary | ICD-10-CM

## 2014-03-06 NOTE — Telephone Encounter (Signed)
Order placed for abdominal ultrasound.   

## 2014-03-14 ENCOUNTER — Ambulatory Visit: Payer: Self-pay | Admitting: Internal Medicine

## 2014-03-15 ENCOUNTER — Telehealth: Payer: Self-pay | Admitting: Internal Medicine

## 2014-03-15 DIAGNOSIS — R7989 Other specified abnormal findings of blood chemistry: Secondary | ICD-10-CM

## 2014-03-15 DIAGNOSIS — R945 Abnormal results of liver function studies: Secondary | ICD-10-CM

## 2014-03-15 NOTE — Addendum Note (Signed)
Addended by: Alisa Graff on: 03/15/2014 01:16 PM   Modules accepted: Orders

## 2014-03-15 NOTE — Telephone Encounter (Signed)
Order placed for GI referral.  Pt notified via my chart.  

## 2014-03-15 NOTE — Telephone Encounter (Signed)
Pt notified abdominal ultrasound normal via my chart.  Refer to GI.

## 2014-03-22 ENCOUNTER — Ambulatory Visit (INDEPENDENT_AMBULATORY_CARE_PROVIDER_SITE_OTHER): Payer: Medicare Other | Admitting: Internal Medicine

## 2014-03-22 ENCOUNTER — Encounter: Payer: Self-pay | Admitting: Internal Medicine

## 2014-03-22 VITALS — BP 120/70 | HR 74 | Temp 98.2°F | Ht 63.0 in | Wt 125.5 lb

## 2014-03-22 DIAGNOSIS — R319 Hematuria, unspecified: Secondary | ICD-10-CM

## 2014-03-22 DIAGNOSIS — R109 Unspecified abdominal pain: Secondary | ICD-10-CM

## 2014-03-22 DIAGNOSIS — I10 Essential (primary) hypertension: Secondary | ICD-10-CM

## 2014-03-22 DIAGNOSIS — R350 Frequency of micturition: Secondary | ICD-10-CM

## 2014-03-22 DIAGNOSIS — E039 Hypothyroidism, unspecified: Secondary | ICD-10-CM

## 2014-03-22 DIAGNOSIS — E7800 Pure hypercholesterolemia, unspecified: Secondary | ICD-10-CM

## 2014-03-22 DIAGNOSIS — R1031 Right lower quadrant pain: Secondary | ICD-10-CM

## 2014-03-22 DIAGNOSIS — K219 Gastro-esophageal reflux disease without esophagitis: Secondary | ICD-10-CM

## 2014-03-22 DIAGNOSIS — D649 Anemia, unspecified: Secondary | ICD-10-CM

## 2014-03-22 DIAGNOSIS — R928 Other abnormal and inconclusive findings on diagnostic imaging of breast: Secondary | ICD-10-CM

## 2014-03-22 LAB — URINALYSIS, ROUTINE W REFLEX MICROSCOPIC
Bilirubin Urine: NEGATIVE
KETONES UR: NEGATIVE
Leukocytes, UA: NEGATIVE
Nitrite: NEGATIVE
PH: 7 (ref 5.0–8.0)
Total Protein, Urine: NEGATIVE
UROBILINOGEN UA: 0.2 (ref 0.0–1.0)
Urine Glucose: NEGATIVE

## 2014-03-22 NOTE — Progress Notes (Signed)
Pre visit review using our clinic review tool, if applicable. No additional management support is needed unless otherwise documented below in the visit note. 

## 2014-03-23 LAB — CULTURE, URINE COMPREHENSIVE
Colony Count: NO GROWTH
Organism ID, Bacteria: NO GROWTH

## 2014-03-24 ENCOUNTER — Encounter: Payer: Self-pay | Admitting: Internal Medicine

## 2014-03-26 ENCOUNTER — Encounter: Payer: Self-pay | Admitting: Internal Medicine

## 2014-03-26 NOTE — Progress Notes (Signed)
Subjective:    Patient ID: Savannah Cox, female    DOB: July 13, 1945, 68 y.o.   MRN: [id number]  HPI 68 year old female with past history of hypertension, hypercholesterolemia, reoccurring allergy and sinus problems, hypothyroidism and anemia who comes in today for a scheduled follow up.   She is taking omeprazole and ranitidine now. The acid reflux is better.  She is having some right lower quadrant pain and right flank pain.  Intermittent.  Some associated nausea.   No vomiting.  Some increased urinary frequency.  Symptoms started two weeks ago.  Was questioning the possibility of a kidney stone.  She is eating and drinking.  No further chest discomfort or sob.  She did have her stress test.  Negative for ischemia.  Was following with Dr Bernardo Heater.  Planning to transfer to local urologist in town.  Still with increased stress.  Coping with her mother's medical issues.  She feels she is handling stress relatively well.  Desires no further intervention.  Has good support.  Recent LFTs noted on labs.  Ultrasound negative for fatty liver.  Referred to GI for further evaluation.        Past Medical History  Diagnosis Date  . Hypertension   . Hypercholesterolemia   . Hypothyroidism     Current Outpatient Prescriptions on File Prior to Visit  Medication Sig Dispense Refill  . estrogen, conjugated,-medroxyprogesterone (PREMPRO) 0.3-1.5 MG per tablet Take 1 table twice a week      . glucosamine-chondroitin 500-400 MG tablet Take 1 tablet by mouth daily.      Marland Kitchen levothyroxine (SYNTHROID, LEVOTHROID) 50 MCG tablet TAKE 1 TABLET BY MOUTH EVERY DAY  30 tablet  8  . lisinopril-hydrochlorothiazide (PRINZIDE,ZESTORETIC) 10-12.5 MG per tablet TAKE 1 TABLET BY MOUTH DAILY.  30 tablet  5  . loratadine (CLARITIN) 10 MG tablet Take 10 mg by mouth daily.      Marland Kitchen omeprazole (PRILOSEC) 20 MG capsule TAKE 1 CAPSULE BY MOUTH DAILY.  30 capsule  5  . Probiotic Product (PROBIOTIC DAILY PO) Take by mouth.      .  sertraline (ZOLOFT) 50 MG tablet TAKE 1 TABLET BY MOUTH EVERY DAY  30 tablet  1   No current facility-administered medications on file prior to visit.    Review of Systems Patient denies any headache, lightheadedness or dizziness.  No significant sinus or allergy symptoms.   No increased cough or congestion.  No increased sob.  Acid reflux better.  On omeprazole and ranitidine.   No nausea.  No vomiting.   Right flank and right lower quadrant pain as outlined.  No significant bowel change now.  Previous loose stool, but better now.   No BRBPR or melana.  Some increased urinary frequency.  Increased stress with her mother's health.  Desires no further intervention at this time.      Objective:   Physical Exam  Filed Vitals:   03/22/14 1055  BP: 120/70  Pulse: 74  Temp: 98.2 F (36.8 C)   Blood pressure recheck:  81/31  68 year old female in no acute distress.   HEENT:  Nares- clear.  Oropharynx - without lesions. NECK:  Supple.  Nontender.  No audible bruit.  HEART:  Appears to be regular. LUNGS:  No crackles or wheezing audible.  Respirations even and unlabored.  RADIAL PULSE:  Equal bilaterally.    ABDOMEN:  Soft, nontender.  No reproducible pain to palpation over the right lower quadrant.  Bowel sounds present and  normal.  No audible abdominal bruit.     EXTREMITIES:  No increased edema present.  DP pulses palpable and equal bilaterally.             Assessment & Plan:  CARDIOVASCULAR.  Recent stress test negative for ischemia.  Currently doing well.  Feels better.    PULMONARY.  Recent cxr negative.  Doing well.    GYN.   Pap - normal.   HEALTH MAINTENANCE.  Physical 04/28/13.   Mammogram 06/13/13 - recommended a 6 month follow up left breast.  Had f/u left breast mammogram 02/22/14.  Recommended f/u diagnostic mammogram (bilateral) in 12/15.  Colonoscopy 03/15/09 revealed diverticulosis - otherwise normal.

## 2014-03-26 NOTE — Assessment & Plan Note (Signed)
On omeprazole and ranitidine.  Symptoms improved.  Follow.  Has had EGD.

## 2014-03-26 NOTE — Assessment & Plan Note (Signed)
Colonoscopy 03/15/09 revealed diverticulosis - otherwise normal.  Found to be iron deficient.  Was started on iron.  Off now.  EGD 10/28/11 - hiatal hernia - otherwise normal.  Follow cbc and ferritin.

## 2014-03-26 NOTE — Assessment & Plan Note (Signed)
Blood pressure under good control.  Same meds.  Follow metabolic panel.

## 2014-03-26 NOTE — Assessment & Plan Note (Signed)
Low cholesterol diet and exercise.  Follow lipid panel.  Had muscle aches on simvastatin, pravastatin and Lipitor.   Does not want to start cholesterol medication.  Wants to continue to work on diet and exercise.  Will follow.

## 2014-03-26 NOTE — Assessment & Plan Note (Signed)
Mammogram 06/13/13 recommended f/u 6 month left mammo.  Left breast mammo 02/22/14 - [facility].  Recommended bilateral diagnostic mammogram in 12/15.

## 2014-03-26 NOTE — Assessment & Plan Note (Signed)
Right lower abdominal pain and right flank pain.  Concern over possible kidney stone.  Discussed CT with her.  Check urinalysis.  Refer to urology as outlined.  Previously seeing Dr Bernardo Heater.  Wants to stay in town.  Push fluids.  Strain urine.  Follow.

## 2014-03-26 NOTE — Assessment & Plan Note (Signed)
On thyroid placement.  Follow tsh.   

## 2014-03-26 NOTE — Assessment & Plan Note (Signed)
Worked up by urology.  Had negative CT except for left renal cyst.  Negative cystoscopy.   Urinalysis 5/15 revealed 3-6 rbc's.  Follow.   Last CT - hypodensity - kidney.  See report.  Check f/u urinalysis.  Urology recommended yearly urinalysis.  Plans to follow up with urology here in town.  Referred to [place].

## 2014-04-19 ENCOUNTER — Ambulatory Visit: Payer: Self-pay

## 2014-04-27 ENCOUNTER — Encounter: Payer: Self-pay | Admitting: Internal Medicine

## 2014-05-05 ENCOUNTER — Encounter: Payer: Self-pay | Admitting: Adult Health

## 2014-05-05 ENCOUNTER — Other Ambulatory Visit: Payer: Self-pay | Admitting: Internal Medicine

## 2014-05-21 ENCOUNTER — Other Ambulatory Visit: Payer: Self-pay | Admitting: Internal Medicine

## 2014-06-02 ENCOUNTER — Encounter: Payer: Self-pay | Admitting: Internal Medicine

## 2014-06-02 ENCOUNTER — Ambulatory Visit (INDEPENDENT_AMBULATORY_CARE_PROVIDER_SITE_OTHER): Payer: Medicare Other | Admitting: Internal Medicine

## 2014-06-02 VITALS — BP 120/70 | HR 70 | Temp 98.7°F | Ht 63.0 in | Wt 126.5 lb

## 2014-06-02 DIAGNOSIS — I10 Essential (primary) hypertension: Secondary | ICD-10-CM

## 2014-06-02 DIAGNOSIS — R319 Hematuria, unspecified: Secondary | ICD-10-CM

## 2014-06-02 DIAGNOSIS — E7800 Pure hypercholesterolemia, unspecified: Secondary | ICD-10-CM

## 2014-06-02 DIAGNOSIS — R928 Other abnormal and inconclusive findings on diagnostic imaging of breast: Secondary | ICD-10-CM

## 2014-06-02 DIAGNOSIS — D649 Anemia, unspecified: Secondary | ICD-10-CM

## 2014-06-02 DIAGNOSIS — K219 Gastro-esophageal reflux disease without esophagitis: Secondary | ICD-10-CM

## 2014-06-02 DIAGNOSIS — E039 Hypothyroidism, unspecified: Secondary | ICD-10-CM

## 2014-06-02 NOTE — Progress Notes (Signed)
Pre visit review using our clinic review tool, if applicable. No additional management support is needed unless otherwise documented below in the visit note. 

## 2014-06-02 NOTE — Progress Notes (Signed)
Subjective:    Patient ID: Savannah Cox, female    DOB: [birth year], 68 y.o.   MRN: [id number]  HPI 68 year old female with past history of hypertension, hypercholesterolemia, reoccurring allergy and sinus problems, hypothyroidism and anemia who comes in today for a scheduled follow up.   She is taking omeprazole and ranitidine. The acid reflux is better.  No nausea.  No vomiting.  No abdominal pain.  Pain resolved.   She is eating and drinking.  No further chest discomfort or sob.  She did have her stress test.  Negative for ischemia.  Was following with Dr Bernardo Heater.  Now seeing Dr Lyla Son.  Recent urological w/up negative.   Still with increased stress.  Coping with her mother's medical issues.  She feels she is handling stress relatively well.  Desires no further intervention.  Has good support.  Recent increased LFTs noted on labs.  Ultrasound negative for fatty liver.  Referred to GI for further evaluation.  No acute abnormality found.  Follow.  Overall she feels she is doing well.      Past Medical History  Diagnosis Date  . Hypertension   . Hypercholesterolemia   . Hypothyroidism     Current Outpatient Prescriptions on File Prior to Visit  Medication Sig Dispense Refill  . glucosamine-chondroitin 500-400 MG tablet Take 1 tablet by mouth daily.    Marland Kitchen levothyroxine (SYNTHROID, LEVOTHROID) 50 MCG tablet TAKE 1 TABLET BY MOUTH EVERY DAY 30 tablet 5  . lisinopril-hydrochlorothiazide (PRINZIDE,ZESTORETIC) 10-12.5 MG per tablet TAKE 1 TABLET BY MOUTH DAILY. 30 tablet 5  . loratadine (CLARITIN) 10 MG tablet Take 10 mg by mouth daily.    Marland Kitchen omeprazole (PRILOSEC) 20 MG capsule TAKE 1 CAPSULE BY MOUTH DAILY. 30 capsule 5  . Probiotic Product (PROBIOTIC DAILY PO) Take by mouth.    . sertraline (ZOLOFT) 50 MG tablet TAKE 1 TABLET BY MOUTH EVERY DAY 30 tablet 1   No current facility-administered medications on file prior to visit.    Review of Systems Patient denies any headache,  lightheadedness or dizziness.  No significant sinus or allergy symptoms.   No increased cough or congestion.  No increased sob.  Acid reflux better.  On omeprazole and ranitidine.   No nausea.  No vomiting.  No abdominal pain or flank pain now.   No significant bowel change now.   No BRBPR or melana.  Increased stress with her mother's health.  Desires no further intervention at this time.  Feels she is handling things well.  No urinary symptoms.  Just had urological w/up by  Dr Lyla Son.  States her blood pressures have been doing well (120s)     Objective:   Physical Exam  Filed Vitals:   06/02/14 1102  BP: 120/70  Pulse: 70  Temp: 98.7 F (37.1 C)   Blood pressure recheck:  58/32  68 year old female in no acute distress.   HEENT:  Nares- clear.  Oropharynx - without lesions. NECK:  Supple.  Nontender.  No audible bruit.  HEART:  Appears to be regular. LUNGS:  No crackles or wheezing audible.  Respirations even and unlabored.  RADIAL PULSE:  Equal bilaterally.    ABDOMEN:  Soft, nontender.  Bowel sounds present and normal.  No audible abdominal bruit.     EXTREMITIES:  No increased edema present.  DP pulses palpable and equal bilaterally.             Assessment & Plan:  1. Abnormal mammogram Due  f/u bilateral diagnostic mammogram.  - MM Digital Diagnostic Bilat; Future  2. Essential hypertension Blood pressure as outlined.  Same medication regimen.  Follow.   - Basic metabolic panel; Future  3. Gastroesophageal reflux disease, esophagitis presence not specified Controlled on current regimen.  Follow.    4. Hypothyroidism, unspecified hypothyroidism type On thyroid replacement.  Follow tsh.    5. Anemia, unspecified anemia type Last hgb wnl.  Follow.    6. Hematuria Just saw Dr Lyla Son.  W/up negative.  Follow.  Recommended f/u urinalysis in one year.    7. Hypercholesterolemia Low cholesterol diet ane exercise.  She declines cholesterol medication.  Follow.   - Lipid  panel; Future - Hepatic function panel; Future  8. CARDIOVASCULAR. Stress test negative for ischemia.  Currently doing well.  Feels better.    9. PULMONARY.  CXR negative.  Doing well.    10. GYN.   Pap - normal.   HEALTH MAINTENANCE.  Physical 04/28/13.   Mammogram 06/13/13 - recommended a 6 month follow up left breast.  Had f/u left breast mammogram 02/22/14.  Recommended f/u diagnostic mammogram (bilateral) in 12/15.  Scheduled.  Colonoscopy 03/15/09 revealed diverticulosis - otherwise normal.

## 2014-06-03 ENCOUNTER — Other Ambulatory Visit: Payer: Self-pay | Admitting: Internal Medicine

## 2014-06-21 ENCOUNTER — Ambulatory Visit: Payer: Self-pay | Admitting: Internal Medicine

## 2014-06-21 LAB — HM MAMMOGRAPHY

## 2014-06-22 ENCOUNTER — Encounter: Payer: Self-pay | Admitting: Internal Medicine

## 2014-06-30 ENCOUNTER — Other Ambulatory Visit (INDEPENDENT_AMBULATORY_CARE_PROVIDER_SITE_OTHER): Payer: Medicare Other

## 2014-06-30 DIAGNOSIS — I10 Essential (primary) hypertension: Secondary | ICD-10-CM

## 2014-06-30 DIAGNOSIS — E7800 Pure hypercholesterolemia, unspecified: Secondary | ICD-10-CM

## 2014-06-30 LAB — LIPID PANEL
Cholesterol: 245 mg/dL — ABNORMAL HIGH (ref 0–200)
HDL: 65.6 mg/dL (ref >39.00)
LDL Cholesterol: 161 mg/dL — ABNORMAL HIGH (ref 0–99)
NonHDL: 179.4
Total CHOL/HDL Ratio: 4
Triglycerides: 93 mg/dL (ref 0.0–149.0)
VLDL: 18.6 mg/dL (ref 0.0–40.0)

## 2014-06-30 LAB — BASIC METABOLIC PANEL
BUN: 18 mg/dL (ref 6–23)
CALCIUM: 9.6 mg/dL (ref 8.4–10.5)
CO2: 27 meq/L (ref 19–32)
Chloride: 104 mEq/L (ref 96–112)
Creatinine, Ser: 0.71 mg/dL (ref 0.40–1.20)
GFR: 87.38 mL/min (ref >60.00)
Glucose, Bld: 94 mg/dL (ref 70–99)
POTASSIUM: 3.9 meq/L (ref 3.5–5.1)
Sodium: 138 mEq/L (ref 135–145)

## 2014-06-30 LAB — HEPATIC FUNCTION PANEL
ALBUMIN: 4 g/dL (ref 3.5–5.2)
ALT: 39 U/L — AB (ref 0–35)
AST: 44 U/L — ABNORMAL HIGH (ref 0–37)
Alkaline Phosphatase: 65 U/L (ref 39–117)
BILIRUBIN TOTAL: 0.3 mg/dL (ref 0.2–1.2)
Bilirubin, Direct: 0 mg/dL (ref 0.0–0.3)
Total Protein: 7.1 g/dL (ref 6.0–8.3)

## 2014-07-01 ENCOUNTER — Encounter: Payer: Self-pay | Admitting: Internal Medicine

## 2014-07-04 ENCOUNTER — Telehealth: Payer: Self-pay | Admitting: Internal Medicine

## 2014-07-04 ENCOUNTER — Telehealth: Payer: Self-pay | Admitting: *Deleted

## 2014-07-04 ENCOUNTER — Other Ambulatory Visit: Payer: Self-pay | Admitting: Internal Medicine

## 2014-07-04 DIAGNOSIS — R7989 Other specified abnormal findings of blood chemistry: Secondary | ICD-10-CM

## 2014-07-04 DIAGNOSIS — R945 Abnormal results of liver function studies: Secondary | ICD-10-CM

## 2014-07-04 MED ORDER — ROSUVASTATIN CALCIUM 5 MG PO TABS: 5.0000 mg | ORAL_TABLET | Freq: Every day | ORAL | Status: DC

## 2014-07-04 NOTE — Progress Notes (Signed)
Order placed for f/u liver panel.  

## 2014-07-04 NOTE — Telephone Encounter (Signed)
Pt needs a non fasting lab appt in one month.  Please schedule and contact her with an appt date and time.  Thanks.

## 2014-07-04 NOTE — Telephone Encounter (Signed)
Savannah Cox with [facility] called to notify Dr. Nicki Reaper that Dr. Candace Cruise has reviewed [name]'s labs & her LFT was just mildly elevated. Pt is okay to resume statin. Need to recheck LFT one month after restarting statin.

## 2014-07-04 NOTE — Telephone Encounter (Signed)
rx sent in for crestor #30 with one refill.  Needs f/u liver panel in one month.

## 2014-07-29 ENCOUNTER — Other Ambulatory Visit: Payer: Self-pay | Admitting: Internal Medicine

## 2014-08-07 ENCOUNTER — Other Ambulatory Visit (INDEPENDENT_AMBULATORY_CARE_PROVIDER_SITE_OTHER): Payer: Medicare Other

## 2014-08-07 DIAGNOSIS — R7989 Other specified abnormal findings of blood chemistry: Secondary | ICD-10-CM

## 2014-08-07 DIAGNOSIS — R945 Abnormal results of liver function studies: Secondary | ICD-10-CM

## 2014-08-07 LAB — HEPATIC FUNCTION PANEL
ALT: 29 U/L (ref 0–35)
AST: 33 U/L (ref 0–37)
Albumin: 3.9 g/dL (ref 3.5–5.2)
Alkaline Phosphatase: 56 U/L (ref 39–117)
BILIRUBIN DIRECT: 0.1 mg/dL (ref 0.0–0.3)
BILIRUBIN TOTAL: 0.3 mg/dL (ref 0.2–1.2)
Total Protein: 6.6 g/dL (ref 6.0–8.3)

## 2014-08-09 ENCOUNTER — Encounter: Payer: Self-pay | Admitting: Internal Medicine

## 2014-09-04 ENCOUNTER — Ambulatory Visit (INDEPENDENT_AMBULATORY_CARE_PROVIDER_SITE_OTHER): Payer: Medicare Other | Admitting: Internal Medicine

## 2014-09-04 ENCOUNTER — Encounter: Payer: Self-pay | Admitting: Internal Medicine

## 2014-09-04 VITALS — BP 113/61 | HR 65 | Temp 98.2°F | Ht 63.0 in | Wt 132.1 lb

## 2014-09-04 DIAGNOSIS — R319 Hematuria, unspecified: Secondary | ICD-10-CM

## 2014-09-04 DIAGNOSIS — E7800 Pure hypercholesterolemia, unspecified: Secondary | ICD-10-CM

## 2014-09-04 DIAGNOSIS — Z0000 Encounter for general adult medical examination without abnormal findings: Secondary | ICD-10-CM | POA: Insufficient documentation

## 2014-09-04 DIAGNOSIS — E039 Hypothyroidism, unspecified: Secondary | ICD-10-CM

## 2014-09-04 DIAGNOSIS — R928 Other abnormal and inconclusive findings on diagnostic imaging of breast: Secondary | ICD-10-CM

## 2014-09-04 DIAGNOSIS — I10 Essential (primary) hypertension: Secondary | ICD-10-CM

## 2014-09-04 DIAGNOSIS — K219 Gastro-esophageal reflux disease without esophagitis: Secondary | ICD-10-CM

## 2014-09-04 DIAGNOSIS — M79675 Pain in left toe(s): Secondary | ICD-10-CM

## 2014-09-04 NOTE — Progress Notes (Signed)
Patient ID: Savannah Cox, female   DOB: Dec 08, 1945, 69 y.o.   MRN: [id number]   Subjective:    Patient ID: Savannah Cox, female    DOB: 01/07/1946, 69 y.o.   MRN: [id number]  HPI  Patient here for her physical exam.  States she is doing well.  Handling stress well.  She stopped crestor.  Was causing calf cramps.  Cramps resolved after stopping.  Has not been exercising as much.  Plans to restart.  Not taking omeprazole.  Does not need.  No significant acid reflux.  Bowels stable.  She is having problems with the first and second toe - left foot.  Some pain and pressure - especially with certain shoes.     Past Medical History  Diagnosis Date  . Hypertension   . Hypercholesterolemia   . Hypothyroidism     Current Outpatient Prescriptions on File Prior to Visit  Medication Sig Dispense Refill  . glucosamine-chondroitin 500-400 MG tablet Take 1 tablet by mouth daily.    Marland Kitchen levothyroxine (SYNTHROID, LEVOTHROID) 50 MCG tablet TAKE 1 TABLET BY MOUTH EVERY DAY 30 tablet 5  . lisinopril-hydrochlorothiazide (PRINZIDE,ZESTORETIC) 10-12.5 MG per tablet TAKE 1 TABLET BY MOUTH DAILY. 30 tablet 5  . loratadine (CLARITIN) 10 MG tablet Take 10 mg by mouth daily.    . Multiple Vitamins-Minerals (CENTRUM SILVER ULTRA WOMENS PO) Take by mouth.    Marland Kitchen omeprazole (PRILOSEC) 20 MG capsule TAKE 1 CAPSULE BY MOUTH DAILY. (Patient taking differently: TAKE 1 CAPSULE BY MOUTH AS NEEDED) 30 capsule 5  . Probiotic Product (PROBIOTIC DAILY PO) Take by mouth.    . sertraline (ZOLOFT) 50 MG tablet TAKE 1 TABLET BY MOUTH EVERY DAY 30 tablet 1   No current facility-administered medications on file prior to visit.    Review of Systems  Constitutional: Negative for appetite change and unexpected weight change.  HENT: Negative for congestion and sinus pressure.   Eyes: Negative for pain and visual disturbance.  Respiratory: Negative for cough, chest tightness and shortness of breath.   Cardiovascular: Negative for  chest pain, palpitations and leg swelling.  Gastrointestinal: Negative for nausea, vomiting, abdominal pain and diarrhea.  Genitourinary: Negative for frequency and difficulty urinating.  Musculoskeletal: Negative for back pain and joint swelling.  Skin: Negative for color change and rash.  Neurological: Negative for dizziness, light-headedness and headaches.  Hematological: Negative for adenopathy. Does not bruise/bleed easily.  Psychiatric/Behavioral: Negative for dysphoric mood and agitation.       Objective:     Blood pressure recheck:  118/68  Physical Exam  Constitutional: She is oriented to person, place, and time. She appears well-developed and well-nourished.  HENT:  Nose: Nose normal.  Mouth/Throat: Oropharynx is clear and moist.  Eyes: Right eye exhibits no discharge. Left eye exhibits no discharge. No scleral icterus.  Neck: Neck supple. No thyromegaly present.  Cardiovascular: Normal rate and regular rhythm.   Pulmonary/Chest: Breath sounds normal. No accessory muscle usage. No tachypnea. No respiratory distress. She has no decreased breath sounds. She has no wheezes. She has no rhonchi. Right breast exhibits no inverted nipple, no mass, no nipple discharge and no tenderness (no axillary adenopathy). Left breast exhibits no inverted nipple, no mass, no nipple discharge and no tenderness (no axilarry adenopathy).  Abdominal: Soft. Bowel sounds are normal. There is no tenderness.  Musculoskeletal: She exhibits no edema or tenderness.  Lymphadenopathy:    She has no cervical adenopathy.  Neurological: She is alert and oriented to person, place, and  time.  Skin: Skin is warm. No rash noted.  Psychiatric: She has a normal mood and affect. Her behavior is normal.    BP 113/61 mmHg  Pulse 65  Temp(Src) 98.2 F (36.8 C) (Oral)  Ht 5' 3"  (1.6 m)  Wt 132 lb 2 oz (59.932 kg)  BMI 23.41 kg/m2  SpO2 96% Wt Readings from Last 3 Encounters:  09/04/14 132 lb 2 oz (59.932 kg)    06/02/14 126 lb 8 oz (57.38 kg)  03/22/14 125 lb 8 oz (56.926 kg)     Lab Results  Component Value Date   WBC 5.1 09/15/2013   HGB 13.2 09/15/2013   HCT 39.0 09/15/2013   PLT 267.0 09/15/2013   GLUCOSE 94 06/30/2014   CHOL 245* 06/30/2014   TRIG 93.0 06/30/2014   HDL 65.60 06/30/2014   LDLDIRECT 165.9 05/11/2013   LDLCALC 161* 06/30/2014   ALT 29 08/07/2014   AST 33 08/07/2014   NA 138 06/30/2014   K 3.9 06/30/2014   CL 104 06/30/2014   CREATININE 0.71 06/30/2014   BUN 18 06/30/2014   CO2 27 06/30/2014   TSH 2.86 03/01/2014        Assessment & Plan:   Problem List Items Addressed This Visit    Abnormal mammogram    Mammogram 06/21/14 - Birads III.  Recommend a one year follow up to confirm stable for two years.        GERD (gastroesophageal reflux disease)    Reflux not an issue now.  Off omeprazole.  Follow.       Health care maintenance    Physical 09/04/14.  PAP 04/29/12 negative with negative HPV.  Wants to recheck next year.  Mammogram 06/21/14 - Birads III.  Recommend f/u in one year to confirm stable.  Colonoscopy 03/15/09.  IFOB given.        Hematuria    Saw Dr Erlene Quan.  CT urogram 04/19/14 - no evidence of GU pathology.  Urinalysis negative.  Recommend a f/u urinalysis in one year.        Relevant Orders   CBC with Differential/Platelet   Hypercholesterolemia - Primary    Low cholesterol diet and exercise.  Did not tolerate lipitor, pravastatin and lastly crestor.  Wants to work on diet and exercise.  Follow lipid panel.        Relevant Orders   Lipid panel   Hepatic function panel   Hypertension    Blood pressure doing well.  Same medication regimen.  BP check by me - 118/68.  Follow met b.  Cr .7 06/30/14.        Relevant Orders   Basic metabolic panel   Hypothyroidism    On thyroid replacement.  Follow tsh.        Toe pain, left    Some pain and pressure - especially with certain shoes.  Refer to Dr Milinda Pointer.        Relevant Orders    Ambulatory referral to Podiatry     I spent 25 minutes with the patient and more than 50% of the time was spent in consultation regarding the above.     Einar Pheasant, MD

## 2014-09-04 NOTE — Assessment & Plan Note (Signed)
On thyroid replacement.  Follow tsh.  

## 2014-09-04 NOTE — Assessment & Plan Note (Signed)
Blood pressure doing well.  Same medication regimen.  BP check by me - 118/68.  Follow met b.  Cr .7 06/30/14.   

## 2014-09-04 NOTE — Assessment & Plan Note (Signed)
Some pain and pressure - especially with certain shoes.  Refer to Dr Milinda Pointer.

## 2014-09-04 NOTE — Assessment & Plan Note (Signed)
Mammogram 06/21/14 - Birads III.  Recommend a one year follow up to confirm stable for two years.

## 2014-09-04 NOTE — Assessment & Plan Note (Signed)
Low cholesterol diet and exercise.  Did not tolerate lipitor, pravastatin and lastly crestor.  Wants to work on diet and exercise.  Follow lipid panel.

## 2014-09-04 NOTE — Assessment & Plan Note (Signed)
Saw Dr Erlene Quan.  CT urogram 04/19/14 - no evidence of GU pathology.  Urinalysis negative.  Recommend a f/u urinalysis in one year.

## 2014-09-04 NOTE — Assessment & Plan Note (Signed)
Physical 09/04/14.  PAP 04/29/12 negative with negative HPV.  Wants to recheck next year.  Mammogram 06/21/14 - Birads III.  Recommend f/u in one year to confirm stable.  Colonoscopy 03/15/09.  IFOB given.

## 2014-09-04 NOTE — Assessment & Plan Note (Signed)
Reflux not an issue now.  Off omeprazole.  Follow.

## 2014-09-04 NOTE — Progress Notes (Signed)
Pre visit review using our clinic review tool, if applicable. No additional management support is needed unless otherwise documented below in the visit note. 

## 2014-09-23 ENCOUNTER — Other Ambulatory Visit: Payer: Self-pay | Admitting: Internal Medicine

## 2014-09-24 ENCOUNTER — Other Ambulatory Visit: Payer: Self-pay | Admitting: Internal Medicine

## 2014-09-25 ENCOUNTER — Ambulatory Visit: Payer: Self-pay

## 2014-09-25 ENCOUNTER — Encounter: Payer: Self-pay | Admitting: Podiatry

## 2014-09-25 ENCOUNTER — Ambulatory Visit: Payer: Self-pay | Admitting: Podiatry

## 2014-09-25 ENCOUNTER — Ambulatory Visit (INDEPENDENT_AMBULATORY_CARE_PROVIDER_SITE_OTHER): Payer: Medicare Other | Admitting: Podiatry

## 2014-09-25 VITALS — BP 114/70 | HR 70 | Resp 18 | Ht 63.0 in | Wt 130.0 lb

## 2014-09-25 DIAGNOSIS — M79672 Pain in left foot: Secondary | ICD-10-CM | POA: Diagnosis not present

## 2014-09-25 DIAGNOSIS — M2040 Other hammer toe(s) (acquired), unspecified foot: Secondary | ICD-10-CM | POA: Diagnosis not present

## 2014-09-25 NOTE — Progress Notes (Signed)
   Subjective:    Patient ID: Savannah Cox, female    DOB: 1946-05-21, 69 y.o.   MRN: [id number]  HPI 69 year old female presents the office they for concerns that her toes are starting to crossover and she went to help prevent them from getting worse. She denies any pain associated with the toes and she denies any recent injury or trauma. She denies any swelling or redness. Denies any numbness or tingling. No other complaints at this time.  Review of Systems  HENT: Positive for sinus pressure.   All other systems reviewed and are negative.      Objective:   Physical Exam AAO x3, NAD DP/PT pulses palpable bilaterally, CRT less than 3 seconds Protective sensation intact with Simms Weinstein monofilament, vibratory sensation intact, Achilles tendon reflex intact There is slight hammertoe contractures of the lesser digits there is some mild medial deviation of the second digit. There is no tenderness palpation or pain with vibratory sensation around the digits or metatarsals or other areas of the foot/ankle. There is no pain with MTPJ range of motion and there is no crepitation.  No areas of tenderness to bilateral lower extremities. MMT 5/5, ROM WNL.  No open lesions or pre-ulcerative lesions.  No overlying edema, erythema, increase in warmth to bilateral lower extremities.  No pain with calf compression, swelling, warmth, erythema bilaterally.       Assessment & Plan:  69 year old female with mild hammertoe contractures -X-rays were obtained and reviewed the patient -Treatment options were discussed include alternatives, risks, complications -Discussed the patient in order to help slow the progression of the toe contractures discussed possible orthotics to help control her foot type. She will look at purchasing an over-the-counter inserts. Also dispensed toe crest to help as well. Continue to monitor.  -Follow-up as needed. Encouraged to call the office with any questions or concerns  or any changes symptoms.

## 2014-10-03 NOTE — Op Note (Signed)
PATIENT NAME:  JMYA, ULIANO MR#:  [id number] DATE OF BIRTH:  [birth year]  DATE OF PROCEDURE:  04/26/2012  PREOPERATIVE DIAGNOSIS:  Cataract, right eye.   POSTOPERATIVE DIAGNOSIS:  Cataract, right eye.  PROCEDURE PERFORMED:  Extracapsular cataract extraction using phacoemulsification with placement of an Alcon [id number], 19.0-diopter posterior chamber lens, serial # [id number].  SURGEON:  Loura Back. Rocio Roam, MD  ASSISTANT:  None.  ANESTHESIA:  4% lidocaine and 0.75% Marcaine in a 50/50 mixture with 10 units/ mL of Hylenex added, given as a peribulbar.  ANESTHESIOLOGIST:  Dr. Benjamine Mola.  COMPLICATIONS:  None.  ESTIMATED BLOOD LOSS:  Less than 1 mL.  DESCRIPTION OF PROCEDURE:  The patient was brought to the operating room and given a peribulbar block.  The patient was then prepped and draped in the usual fashion.  The vertical rectus muscles were imbricated using 5-0 silk sutures.  These sutures were then clamped to the sterile drapes as bridle sutures.  A limbal peritomy was performed extending two clock hours and hemostasis was obtained with cautery.  A partial thickness scleral groove was made at the surgical limbus and dissected anteriorly in a lamellar dissection using an Alcon crescent knife.  The anterior chamber was entered superonasally with a Superblade and through the lamellar dissection with a 2.6 mm keratome.  DisCoVisc was used to replace the aqueous and a continuous tear capsulorrhexis was carried out.  Hydrodissection and hydrodelineation were carried out with balanced salt and a 27 gauge canula.  The nucleus was rotated to confirm the effectiveness of the hydrodissection.  Phacoemulsification was carried out using a divide-and-conquer technique.  Total ultrasound time was 1 minute and 10 seconds with an average power of 19.8 percent. CDE of 23.74.  Irrigation/aspiration was used to remove the residual cortex.  DisCoVisc was used to inflate the capsule and the internal incision  was enlarged to 3 mm with the crescent knife.  The intraocular lens was folded and inserted into the capsular bag using the AcrySert delivery system.  Irrigation/aspiration was used to remove the residual DisCoVisc.  Miostat was injected into the anterior chamber through the paracentesis track to inflate the anterior chamber and induce miosis.  The wound was checked for leaks and none were found. The conjunctiva was closed with cautery and the bridle sutures were removed.  Two drops of 0.3% Vigamox were placed on the eye.   An eye shield was placed on the eye.  The patient was discharged to the recovery room in good condition.  ____________________________ Loura Back Onaje Warne, MD sad:cms D: 04/26/2012 13:15:44 ET T: 04/26/2012 13:29:21 ET JOB#: [id number]  cc: Remo Lipps A. Jaquavis Felmlee, MD, <Dictator> Martie Lee MD ELECTRONICALLY SIGNED 05/10/2012 14:39

## 2014-10-16 ENCOUNTER — Telehealth: Payer: Self-pay | Admitting: Internal Medicine

## 2014-10-16 NOTE — Telephone Encounter (Signed)
Sent mychart message

## 2014-10-16 NOTE — Telephone Encounter (Signed)
The results of her 06/21/14 mammogram state Birads III- but recommend f/u diagnostic mammogram in one year.  I do not see where she is due for mammogram.  Can she clarify?  Is she having problems?

## 2014-10-16 NOTE — Telephone Encounter (Signed)
Received mychart msg from pt requesting to have a mammogram. No order in system. Please advise.msn

## 2014-11-11 ENCOUNTER — Other Ambulatory Visit: Payer: Self-pay | Admitting: Internal Medicine

## 2014-12-25 ENCOUNTER — Other Ambulatory Visit: Payer: Self-pay | Admitting: Internal Medicine

## 2015-01-08 ENCOUNTER — Encounter: Payer: Self-pay | Admitting: Internal Medicine

## 2015-01-08 ENCOUNTER — Other Ambulatory Visit (INDEPENDENT_AMBULATORY_CARE_PROVIDER_SITE_OTHER): Payer: Medicare Other

## 2015-01-08 DIAGNOSIS — E7800 Pure hypercholesterolemia, unspecified: Secondary | ICD-10-CM

## 2015-01-08 DIAGNOSIS — I10 Essential (primary) hypertension: Secondary | ICD-10-CM

## 2015-01-08 DIAGNOSIS — R319 Hematuria, unspecified: Secondary | ICD-10-CM | POA: Diagnosis not present

## 2015-01-08 LAB — CBC WITH DIFFERENTIAL/PLATELET
Basophils Absolute: 0 10*3/uL (ref 0.0–0.1)
Basophils Relative: 1 % (ref 0.0–3.0)
Eosinophils Absolute: 0.2 10*3/uL (ref 0.0–0.7)
Eosinophils Relative: 5 % (ref 0.0–5.0)
HEMATOCRIT: 41.1 % (ref 36.0–46.0)
Hemoglobin: 13.8 g/dL (ref 12.0–15.0)
LYMPHS PCT: 33.4 % (ref 12.0–46.0)
Lymphs Abs: 1.4 10*3/uL (ref 0.7–4.0)
MCHC: 33.7 g/dL (ref 30.0–36.0)
MCV: 95.5 fl (ref 78.0–100.0)
MONOS PCT: 17 % — AB (ref 3.0–12.0)
Monocytes Absolute: 0.7 10*3/uL (ref 0.1–1.0)
Neutro Abs: 1.8 10*3/uL (ref 1.4–7.7)
Neutrophils Relative %: 43.6 % (ref 43.0–77.0)
Platelets: 250 10*3/uL (ref 150.0–400.0)
RBC: 4.3 Mil/uL (ref 3.87–5.11)
RDW: 13.9 % (ref 11.5–15.5)
WBC: 4.2 10*3/uL (ref 4.0–10.5)

## 2015-01-08 LAB — BASIC METABOLIC PANEL
BUN: 16 mg/dL (ref 6–23)
CO2: 26 meq/L (ref 19–32)
CREATININE: 0.71 mg/dL (ref 0.40–1.20)
Calcium: 9.4 mg/dL (ref 8.4–10.5)
Chloride: 105 mEq/L (ref 96–112)
GFR: 87.24 mL/min (ref >60.00)
GLUCOSE: 89 mg/dL (ref 70–99)
Potassium: 4.1 mEq/L (ref 3.5–5.1)
Sodium: 140 mEq/L (ref 135–145)

## 2015-01-08 LAB — HEPATIC FUNCTION PANEL
ALT: 28 U/L (ref 0–35)
AST: 37 U/L (ref 0–37)
Albumin: 4 g/dL (ref 3.5–5.2)
Alkaline Phosphatase: 58 U/L (ref 39–117)
BILIRUBIN DIRECT: 0.1 mg/dL (ref 0.0–0.3)
Total Bilirubin: 0.4 mg/dL (ref 0.2–1.2)
Total Protein: 6.3 g/dL (ref 6.0–8.3)

## 2015-01-08 LAB — LIPID PANEL
Cholesterol: 267 mg/dL — ABNORMAL HIGH (ref 0–200)
HDL: 67.1 mg/dL (ref >39.00)
LDL Cholesterol: 177 mg/dL — ABNORMAL HIGH (ref 0–99)
NONHDL: 199.9
Total CHOL/HDL Ratio: 4
Triglycerides: 116 mg/dL (ref 0.0–149.0)
VLDL: 23.2 mg/dL (ref 0.0–40.0)

## 2015-01-10 ENCOUNTER — Ambulatory Visit (INDEPENDENT_AMBULATORY_CARE_PROVIDER_SITE_OTHER)
Admission: RE | Admit: 2015-01-10 | Discharge: 2015-01-10 | Disposition: A | Discharge status: Home / Self Care | Payer: Medicare Other | Source: Ambulatory Visit | Attending: Internal Medicine | Admitting: Internal Medicine

## 2015-01-10 ENCOUNTER — Encounter: Payer: Self-pay | Admitting: Internal Medicine

## 2015-01-10 ENCOUNTER — Other Ambulatory Visit: Payer: Self-pay | Admitting: Internal Medicine

## 2015-01-10 ENCOUNTER — Ambulatory Visit (INDEPENDENT_AMBULATORY_CARE_PROVIDER_SITE_OTHER): Payer: Medicare Other | Admitting: Internal Medicine

## 2015-01-10 VITALS — BP 100/70 | HR 68 | Temp 97.9°F | Ht 63.0 in | Wt 134.5 lb

## 2015-01-10 DIAGNOSIS — E7800 Pure hypercholesterolemia, unspecified: Secondary | ICD-10-CM

## 2015-01-10 DIAGNOSIS — M25551 Pain in right hip: Secondary | ICD-10-CM

## 2015-01-10 DIAGNOSIS — I10 Essential (primary) hypertension: Secondary | ICD-10-CM

## 2015-01-10 DIAGNOSIS — Z0000 Encounter for general adult medical examination without abnormal findings: Secondary | ICD-10-CM

## 2015-01-10 DIAGNOSIS — R319 Hematuria, unspecified: Secondary | ICD-10-CM

## 2015-01-10 DIAGNOSIS — M79644 Pain in right finger(s): Secondary | ICD-10-CM

## 2015-01-10 DIAGNOSIS — R928 Other abnormal and inconclusive findings on diagnostic imaging of breast: Secondary | ICD-10-CM

## 2015-01-10 DIAGNOSIS — D649 Anemia, unspecified: Secondary | ICD-10-CM

## 2015-01-10 DIAGNOSIS — E039 Hypothyroidism, unspecified: Secondary | ICD-10-CM | POA: Diagnosis not present

## 2015-01-10 DIAGNOSIS — M79645 Pain in left finger(s): Secondary | ICD-10-CM

## 2015-01-10 NOTE — Patient Instructions (Signed)
Start crestor 5mg  - take Monday, Wednesday and Friday

## 2015-01-10 NOTE — Progress Notes (Signed)
Pre visit review using our clinic review tool, if applicable. No additional management support is needed unless otherwise documented below in the visit note. 

## 2015-01-10 NOTE — Progress Notes (Signed)
Patient ID: Savannah Cox, female   DOB: [birth year], 69 y.o.   MRN: [id number]   Subjective:    Patient ID: Savannah Cox, female    DOB: 1946/03/13, 69 y.o.   MRN: [id number]  HPI  Patient here for a scheduled follow up.  She reports some continued discomfort in her right hip.  Also pain base of both thumbs.  She is exercising.  Going to silver sneakers.  No cardiac symptoms with increased activity or exertion.  No sob.  Eating and drinking.  Bowels stable.  Handling stress.  Cholesterol still increased.  Discussed with her today.  Discussed treatment.     Past Medical History  Diagnosis Date  . Hypertension   . Hypercholesterolemia   . Hypothyroidism     Outpatient Encounter Prescriptions as of 01/10/2015  Medication Sig  . glucosamine-chondroitin 500-400 MG tablet Take 1 tablet by mouth daily.  Marland Kitchen levothyroxine (SYNTHROID, LEVOTHROID) 50 MCG tablet TAKE 1 TABLET BY MOUTH EVERY DAY  . lisinopril-hydrochlorothiazide (PRINZIDE,ZESTORETIC) 10-12.5 MG per tablet TAKE 1 TABLET BY MOUTH DAILY.  Marland Kitchen loratadine (CLARITIN) 10 MG tablet Take 10 mg by mouth daily.  . Multiple Vitamins-Minerals (CENTRUM SILVER ULTRA WOMENS PO) Take by mouth.  . sertraline (ZOLOFT) 50 MG tablet TAKE 1 TABLET BY MOUTH EVERY DAY  . [DISCONTINUED] lisinopril-hydrochlorothiazide (PRINZIDE,ZESTORETIC) 10-12.5 MG per tablet TAKE 1 TABLET BY MOUTH DAILY.   No facility-administered encounter medications on file as of 01/10/2015.    Review of Systems  Constitutional: Negative for appetite change and unexpected weight change.  HENT: Negative for congestion and sinus pressure.   Respiratory: Negative for cough, chest tightness and shortness of breath.   Cardiovascular: Negative for chest pain, palpitations and leg swelling.  Gastrointestinal: Negative for nausea, vomiting, abdominal pain and diarrhea.  Genitourinary: Negative for dysuria and difficulty urinating.  Musculoskeletal:       Pain - base of both thumbs.   Some pain - right hip.  Is exercising.   Skin: Negative for color change and rash.  Neurological: Negative for dizziness, light-headedness and headaches.  Psychiatric/Behavioral: Negative for dysphoric mood and agitation.       Objective:    Physical Exam  Constitutional: She appears well-developed and well-nourished. No distress.  HENT:  Nose: Nose normal.  Mouth/Throat: Oropharynx is clear and moist.  Neck: Neck supple. No thyromegaly present.  Cardiovascular: Normal rate and regular rhythm.   Pulmonary/Chest: Breath sounds normal. No respiratory distress. She has no wheezes.  Abdominal: Soft. Bowel sounds are normal. There is no tenderness.  Musculoskeletal: She exhibits no edema or tenderness.  Pain at the base of both thumbs - pain with palpation and with resistance against flexion and extension.    Lymphadenopathy:    She has no cervical adenopathy.  Skin: No rash noted. No erythema.  Psychiatric: She has a normal mood and affect. Her behavior is normal.    BP 100/70 mmHg  Pulse 68  Temp(Src) 97.9 F (36.6 C) (Oral)  Ht 5\' 3"  (1.6 m)  Wt 134 lb 8 oz (61.009 kg)  BMI 23.83 kg/m2  SpO2 95% Wt Readings from Last 3 Encounters:  01/10/15 134 lb 8 oz (61.009 kg)  09/25/14 130 lb (58.968 kg)  09/04/14 132 lb 2 oz (59.932 kg)     Lab Results  Component Value Date   WBC 4.2 01/08/2015   HGB 13.8 01/08/2015   HCT 41.1 01/08/2015   PLT 250.0 01/08/2015   GLUCOSE 89 01/08/2015   CHOL 267* 01/08/2015  TRIG 116.0 01/08/2015   HDL 67.10 01/08/2015   LDLDIRECT 165.9 05/11/2013   LDLCALC 177* 01/08/2015   ALT 28 01/08/2015   AST 37 01/08/2015   NA 140 01/08/2015   K 4.1 01/08/2015   CL 105 01/08/2015   CREATININE 0.71 01/08/2015   BUN 16 01/08/2015   CO2 26 01/08/2015   TSH 2.86 03/01/2014       Assessment & Plan:   Problem List Items Addressed This Visit    Abnormal mammogram    Mammogram 06/21/14 - birads III.  Recommended f/u in one year.        Anemia      Colonoscopy 03/15/09 revealed diverticulosis - otherwise normal.  Found to be iron deficient.  Was started on iron.  Off now.  EGD 10/28/11 - hiatal hernia - otherwise normal.  Follow cbc and ferritin.  01/08/15 hgb wnl.        Health care maintenance    Physical 09/04/14.  PAP 04/29/12 negative with negative HPV.  Mammogram 06/21/14 - Birads III.  Recommended f/u mammogram in one year.  Colonoscopy 03/15/09.       Hematuria    Saw Dr Erlene Quan.  CT urogram 04/19/14 - no evidence of GU pathology.  Urinalysis negative.       Hypercholesterolemia    Persistent elevation.  Discussed treatment today.  She will restart crestor 5mg  q Monday, Wednesday and Friday.  Titrate up as tolerated.  Recheck liver panel in 6 weeks.        Relevant Orders   Hepatic function panel   Hypertension    Blood pressure under good control.  Continue same medication regimen.  Follow pressures.  Follow metabolic panel.        Hypothyroidism    On thyroid replacement.  Follow tsh.       Pain of both thumbs    Persistent pain at the base of both thumbs.  Thumb spica splints.  Follow.  If persistent, may need referral for possible injection.        Right hip pain - Primary    Persistent right hip pain.  Given persistence, will check xray.  May need therapy.          I spent 25 minutes with the patient and more than 50% of the time was spent in consultation regarding the above.     Einar Pheasant, MD

## 2015-01-10 NOTE — Telephone Encounter (Signed)
Order placed for referral to physical therapy.  

## 2015-01-10 NOTE — Progress Notes (Signed)
Order placed for corrected right hip xray.

## 2015-01-14 ENCOUNTER — Encounter: Payer: Self-pay | Admitting: Internal Medicine

## 2015-01-14 DIAGNOSIS — M79645 Pain in left finger(s): Secondary | ICD-10-CM

## 2015-01-14 DIAGNOSIS — M79644 Pain in right finger(s): Secondary | ICD-10-CM | POA: Insufficient documentation

## 2015-01-14 NOTE — Assessment & Plan Note (Signed)
On thyroid replacement.  Follow tsh.  

## 2015-01-14 NOTE — Assessment & Plan Note (Signed)
No problems with acid reflux reported today.  She is off medication.  Follow.

## 2015-01-14 NOTE — Assessment & Plan Note (Signed)
Persistent right hip pain.  Given persistence, will check xray.  May need therapy.

## 2015-01-14 NOTE — Assessment & Plan Note (Signed)
Colonoscopy 03/15/09 revealed diverticulosis - otherwise normal.  Found to be iron deficient.  Was started on iron.  Off now.  EGD 10/28/11 - hiatal hernia - otherwise normal.  Follow cbc and ferritin.  01/08/15 hgb wnl.

## 2015-01-14 NOTE — Assessment & Plan Note (Signed)
Persistent pain at the base of both thumbs.  Thumb spica splints.  Follow.  If persistent, may need referral for possible injection.

## 2015-01-14 NOTE — Assessment & Plan Note (Signed)
Saw Dr Erlene Quan.  CT urogram 04/19/14 - no evidence of GU pathology.  Urinalysis negative.

## 2015-01-14 NOTE — Assessment & Plan Note (Signed)
Persistent elevation.  Discussed treatment today.  She will restart crestor 5mg  q Monday, Wednesday and Friday.  Titrate up as tolerated.  Recheck liver panel in 6 weeks.

## 2015-01-14 NOTE — Assessment & Plan Note (Signed)
Blood pressure under good control.  Continue same medication regimen.  Follow pressures.  Follow metabolic panel.   

## 2015-01-14 NOTE — Assessment & Plan Note (Signed)
Mammogram 06/21/14 - birads III.  Recommended f/u in one year.

## 2015-01-14 NOTE — Assessment & Plan Note (Signed)
Physical 09/04/14.  PAP 04/29/12 negative with negative HPV.  Mammogram 06/21/14 - Birads III.  Recommended f/u mammogram in one year.  Colonoscopy 03/15/09.

## 2015-02-14 ENCOUNTER — Ambulatory Visit: Payer: Medicare Other | Attending: Internal Medicine | Admitting: Physical Therapy

## 2015-02-14 ENCOUNTER — Encounter: Payer: Self-pay | Admitting: Physical Therapy

## 2015-02-14 DIAGNOSIS — M25551 Pain in right hip: Secondary | ICD-10-CM | POA: Diagnosis present

## 2015-02-14 DIAGNOSIS — M6281 Muscle weakness (generalized): Secondary | ICD-10-CM

## 2015-02-14 NOTE — Therapy (Signed)
[facility] [address] [place], [address] Phone: [phone]   Fax:  [phone]  Physical Therapy Evaluation  Patient Details  Name: Savannah Cox MRN: [id number] Date of Birth: Jan 02, 1946 Referring Provider:  Einar Pheasant, MD  Encounter Date: 02/14/2015      PT End of Session - 02/14/15 1030    Visit Number 1   Number of Visits 12   Date for PT Re-Evaluation 03/28/15   Authorization Type 1   Authorization Time Period 10   PT Start Time 0928   PT Stop Time 1020   PT Time Calculation (min) 52 min   Activity Tolerance Patient tolerated treatment well   Behavior During Therapy [facility] for tasks assessed/performed      Past Medical History  Diagnosis Date  . Hypertension   . Hypercholesterolemia   . Hypothyroidism     Past Surgical History  Procedure Laterality Date  . Breast biopsy      left, benign    There were no vitals filed for this visit.  Visit Diagnosis:  Right hip pain - Plan: PT plan of care cert/re-cert  Muscle weakness of lower extremity - Plan: PT plan of care cert/re-cert      Subjective Assessment - 02/14/15 0941    Subjective Patient reports she has mild discomfort in right hip and it is mild at the moment.    Pertinent History Patient reports she began to notice pain deep within her hip about a year ago with walking and putting pressure on her right side with going up stairs. Pain has progressed and is now more constant and intense than when it began.    How long can you sit comfortably? >1 hour   How long can you stand comfortably? no problems   How long can you walk comfortably? only problem is on incline   Diagnostic tests x rays: negative for fx or arthritis   Patient Stated Goals would like to go up stairs and walk without pain    Currently in Pain? Yes   Pain Score 1    Pain Location Hip   Pain Orientation Right   Pain Descriptors / Indicators Sore   Pain  Type Chronic pain   Pain Onset More than a month ago   Multiple Pain Sites No            [facility] PT Assessment - 02/14/15 0947    Assessment   Medical Diagnosis M25.551 Right hip pain   Onset Date/Surgical Date 01/14/14   Hand Dominance Right   Next MD Visit Sept. 2016   Prior Therapy none   Precautions   Precautions None   Restrictions   Weight Bearing Restrictions No   Balance Screen   Has the patient fallen in the past 6 months No   Has the patient had a decrease in activity level because of a fear of falling?  No   Is the patient reluctant to leave their home because of a fear of falling?  No   Home [employer] residence   Living Arrangements Spouse/significant other   [place] to enter   Entrance Stairs-Number of Steps 2   Entrance Stairs-Rails None   Home Layout One level   Prior Function   Level of Independence Independent      Assessment: AROM: right hip decreased ER by 25% as compared to left hip Special tests: FABERS mildly + right hip as  compared to left hip, negative SLR and crossed SLR Flexibility in both hips WFL's for hamstrings, quadriceps, hip flexors Strength: decreased right hip flexion, abduction, ER, knee extension, flexion by ~30% as compared to left LE with isometric holding positions for each muscle group tested Standing posture WFL's with pelvic heights, single leg stance right with hip drop left  Gait; + trendelenburg with stance on right (left hip drops)   Treatment; instructed in home exercises per patient education details, motor control with stance on right to engage right hip abductors and gluteal muscles  Patient response to treatment:  Patient required verbal and tactile cues to perform exercises with correct hip/knee alignment and with good technique, demonstrated good understanding of home program           PT Education - 02/14/15 1015    Education provided Yes   Education Details home  program instruction: sitting hip adduciton with ball and glute sets x 10, hip abduciton with resistive band x 15, side lying clam with/without band around thighs x 15 reps, side lying hip abduction x 10-15 reps, hook lying with LE fall outs with TrA contraction for core control   Person(s) Educated Patient   Methods Explanation;Demonstration;Handout   Comprehension Verbalized understanding;Returned demonstration;Verbal cues required             PT Long Term Goals - 02/14/15 1030    PT LONG TERM GOAL #1   Title Patient will demonstrate improved strength and functional use right LE without pain in right hip for climbing stairs by 03/28/2015   Baseline unable to climb stair without increased right hip pain   Status New   PT LONG TERM GOAL #2   Title Patient will improve self perceived disabiltiy with right LE as demonstrated by LEFS improved to 60/80 0r better by 03/28/2015   Baseline LEFS 53/80   Status New   PT LONG TERM GOAL #3   Title Patient will be independent with home program for self management of symptoms/strength and flexiblity exercises by 03/28/2015 in order to continue to imrpove to full function without right hip pain   Baseline patient has limited knowledge of appropriate exercises for strength, flexibility, motor control    Status New               Plan - 02/14/15 1025    Clinical Impression Statement Patient is a 69 year old female who presents with right hip pain (ranges 3-4/10) that is worse with walking up hill, climbing up stairs, placing weight through right hip. She presents with decreased strength in right hip all planes of motion by ~30% as compared to left LE and demonstrates trendelenburg gait pattern with stance on right LE. She will benefit from physical therapy interveniton to improve strength and flexibility in right hip in order to improve ability to stand and walk without right hip pain. She has limited knowledge of appriate exercises and progression  to achieve full strength and functional use of right LE.    Pt will benefit from skilled therapeutic intervention in order to improve on the following deficits Impaired perceived functional ability;Impaired flexibility;Decreased strength   Rehab Potential Good   Clinical Impairments Affecting Rehab Potential (+) motivated, active, acute exacerbation of pain in right hip  (-) chronic pain in right hip   PT Frequency 2x / week   PT Duration 6 weeks   PT Treatment/Interventions Therapeutic exercise;Electrical Stimulation;Patient/family education;Manual techniques   PT Next Visit Plan review home exercises given and progress with  exercises for strength and motor control right hip, pain control as indicated   PT Home Exercise Plan as instructed   Consulted and Agree with Plan of Care Patient          G-Codes - 03/04/15 1030    Functional Assessment Tool Used LEFS, pain scale, strength assesment, clinical judgment   Functional Limitation Mobility: Walking and moving around   Mobility: Walking and Moving Around Current Status ([id number]) At least 40 percent but less than 60 percent impaired, limited or restricted   Mobility: Walking and Moving Around Goal Status [phone]) 0 percent impaired, limited or restricted   Mobility: Walking and Moving Around Discharge Status [phone]) --       Problem List Patient Active Problem List   Diagnosis Date Noted  . Pain of both thumbs 01/14/2015  . Right hip pain 01/10/2015  . Health care maintenance 09/04/2014  . Abnormal mammogram 09/25/2013  . Abdominal pain 02/22/2013  . Anemia 05/01/2012  . GERD (gastroesophageal reflux disease) 05/01/2012  . Hematuria 05/01/2012  . Hypothyroidism 05/01/2012  . Hypercholesterolemia 05/01/2012  . Hypertension 05/01/2012    Jomarie Longs PT 04-Mar-2015, 7:22 PM  [place] [facility] [address] [place], [address] Phone: [phone]   Fax:   [phone]

## 2015-02-16 ENCOUNTER — Encounter: Payer: Medicare Other | Admitting: Physical Therapy

## 2015-02-20 ENCOUNTER — Ambulatory Visit: Payer: Medicare Other | Attending: Internal Medicine | Admitting: Physical Therapy

## 2015-02-20 ENCOUNTER — Encounter: Payer: Self-pay | Admitting: Physical Therapy

## 2015-02-20 DIAGNOSIS — M6281 Muscle weakness (generalized): Secondary | ICD-10-CM

## 2015-02-20 DIAGNOSIS — M25551 Pain in right hip: Secondary | ICD-10-CM | POA: Insufficient documentation

## 2015-02-20 NOTE — Therapy (Signed)
[facility] [address], [place], [address] Phone: [phone]   Fax:  [phone]  Physical Therapy Treatment  Patient Details  Name: Savannah Cox MRN: [id number] Date of Birth: [birth year] Referring Provider:  Einar Pheasant, MD  Encounter Date: 02/20/2015      PT End of Session - 02/20/15 0843    Visit Number 2   Number of Visits 12   Date for PT Re-Evaluation 03/28/15   Authorization Type 2   Authorization Time Period 10   PT Start Time 0802   PT Stop Time 0841   PT Time Calculation (min) 39 min   Activity Tolerance Patient tolerated treatment well   Behavior During Therapy [facility] for tasks assessed/performed      Past Medical History  Diagnosis Date  . Hypertension   . Hypercholesterolemia   . Hypothyroidism     Past Surgical History  Procedure Laterality Date  . Breast biopsy      left, benign    There were no vitals filed for this visit.  Visit Diagnosis:  Right hip pain  Muscle weakness of lower extremity      Subjective Assessment - 02/20/15 0808    Subjective Patient reports she has been exercising as instructed and is having a little bit of inner hip/groin soreness today.    Patient Stated Goals would like to go up stairs and walk without pain    Currently in Pain? Yes   Pain Score 2    Pain Orientation Right   Pain Descriptors / Indicators Sore   Pain Type Chronic pain   Pain Onset More than a month ago   Multiple Pain Sites No     Objective: Strength: decreased strength right hip abduction and Er 50% as compared to left LE as tested in side lying and also with prone hip extension       [facility] Adult PT Treatment/Exercise - 02/20/15 0809    Exercises   Exercises Other Exercises   Other Exercises  sitting hip adduction with glute sets using ball x 10 reps, 3 second holds, sitting hip abduction stabilizaiton with blue resistive band x 20 reps, side lying clam with  manual graded resistance x 5 reps each LE, hip abduction x  5 reps, prone lying hip extension x 5 reps each, hip extension with knee flexed x 5 reps, bilateral hip extension with ER at hips x 3 reps, leg press with ball between knees for correct hip/knee alignment 25# x 15 reps, standing hip exercises at [employer] rotary hip machine for hip extension, adduction, abduction x 10 reps each LE with graded resistance as tolerated 55# to 85#, step ups with 2# weights held in hands 10 reps each LE forward and lateral step ups, diagonal wedding march holding (2) 2# weights x 2 min. with modified partial for 1 min.     Patient response to treatment: required verbal cuing and demonstration to perform exercises on hip rotary machine. She required tactile and verbal cues for sidelying and prone exercises as well for correct hip/knee alignment which improved with repetition           PT Education - 02/20/15 0840    Education provided Yes   Education Details Instructed in exercises for home: add diagonal wedding march, hip extension prone lying   Person(s) Educated Patient   Methods Explanation;Demonstration;Verbal cues;Handout   Comprehension Verbalized understanding;Returned demonstration;Verbal cues required  PT Long Term Goals - 02/14/15 1030    PT LONG TERM GOAL #1   Title Patient will demonstrate improved strength and functional use right LE without pain in right hip for climbing stairs by 03/28/2015   Baseline unable to climb stair without increased right hip pain   Status New   PT LONG TERM GOAL #2   Title Patient will improve self perceived disabiltiy with right LE as demonstrated by LEFS improved to 60/80 0r better by 03/28/2015   Baseline LEFS 53/80   Status New   PT LONG TERM GOAL #3   Title Patient will be independent with home program for self management of symptoms/strength and flexiblity exercises by 03/28/2015 in order to continue to imrpove to full function without  right hip pain   Baseline patient has limited knowledge of appropriate exercises for strength, flexibility, motor control    Status New               Plan - 02/20/15 0844    Clinical Impression Statement Patient demonstrated good techniques with all exercises with verbal cues and demonstration. She continues with weakness in right hip musculature that contributes to hip pain and decreased abiltiy to walk up steps without pain.   Pt will benefit from skilled therapeutic intervention in order to improve on the following deficits Impaired perceived functional ability;Impaired flexibility;Decreased strength   Rehab Potential Good   PT Frequency 2x / week   PT Duration 6 weeks   PT Treatment/Interventions Therapeutic exercise;Electrical Stimulation;Patient/family education;Manual techniques        Problem List Patient Active Problem List   Diagnosis Date Noted  . Pain of both thumbs 01/14/2015  . Right hip pain 01/10/2015  . Health care maintenance 09/04/2014  . Abnormal mammogram 09/25/2013  . Abdominal pain 02/22/2013  . Anemia 05/01/2012  . GERD (gastroesophageal reflux disease) 05/01/2012  . Hematuria 05/01/2012  . Hypothyroidism 05/01/2012  . Hypercholesterolemia 05/01/2012  . Hypertension 05/01/2012    Jomarie Longs PT 02/20/2015, 10:16 AM  [place] [facility] [address] [place], [address] Phone: [phone]   Fax:  [phone]

## 2015-02-21 ENCOUNTER — Encounter: Payer: Medicare Other | Admitting: Physical Therapy

## 2015-02-21 ENCOUNTER — Other Ambulatory Visit (INDEPENDENT_AMBULATORY_CARE_PROVIDER_SITE_OTHER): Payer: Medicare Other

## 2015-02-21 DIAGNOSIS — E7800 Pure hypercholesterolemia, unspecified: Secondary | ICD-10-CM

## 2015-02-21 LAB — HEPATIC FUNCTION PANEL
ALK PHOS: 58 U/L (ref 39–117)
ALT: 20 U/L (ref 0–35)
AST: 31 U/L (ref 0–37)
Albumin: 3.9 g/dL (ref 3.5–5.2)
BILIRUBIN DIRECT: 0.1 mg/dL (ref 0.0–0.3)
BILIRUBIN TOTAL: 0.4 mg/dL (ref 0.2–1.2)
Total Protein: 6.3 g/dL (ref 6.0–8.3)

## 2015-02-22 ENCOUNTER — Other Ambulatory Visit: Payer: Self-pay | Admitting: Internal Medicine

## 2015-02-22 ENCOUNTER — Encounter: Payer: Self-pay | Admitting: Physical Therapy

## 2015-02-22 ENCOUNTER — Ambulatory Visit: Payer: Medicare Other | Admitting: Physical Therapy

## 2015-02-22 ENCOUNTER — Encounter: Payer: Self-pay | Admitting: *Deleted

## 2015-02-22 DIAGNOSIS — I10 Essential (primary) hypertension: Secondary | ICD-10-CM

## 2015-02-22 DIAGNOSIS — M25551 Pain in right hip: Secondary | ICD-10-CM

## 2015-02-22 DIAGNOSIS — E7800 Pure hypercholesterolemia, unspecified: Secondary | ICD-10-CM

## 2015-02-22 DIAGNOSIS — M6281 Muscle weakness (generalized): Secondary | ICD-10-CM

## 2015-02-22 DIAGNOSIS — E039 Hypothyroidism, unspecified: Secondary | ICD-10-CM

## 2015-02-22 NOTE — Progress Notes (Signed)
Order placed for f/u labs.  

## 2015-02-23 ENCOUNTER — Encounter: Payer: Medicare Other | Admitting: Physical Therapy

## 2015-02-23 NOTE — Therapy (Signed)
[place] [facility] [address] [place], [address] Phone: [phone]   Fax:  [phone]  Physical Therapy Treatment  Patient Details  Name: Savannah Cox MRN: [id number] Date of Birth: 06/20/1945 Referring Provider:  Einar Pheasant, MD  Encounter Date: 02/22/2015      PT End of Session - 02/22/15 1020    Visit Number 3   Number of Visits 12   Date for PT Re-Evaluation 03/28/15   Authorization Type 3   Authorization Time Period 10   PT Start Time [phone]   PT Stop Time 1020   PT Time Calculation (min) 44 min   Activity Tolerance Patient tolerated treatment well   Behavior During Therapy [facility] for tasks assessed/performed      Past Medical History  Diagnosis Date  . Hypertension   . Hypercholesterolemia   . Hypothyroidism     Past Surgical History  Procedure Laterality Date  . Breast biopsy      left, benign    There were no vitals filed for this visit.  Visit Diagnosis:  Muscle weakness of lower extremity  Right hip pain      Subjective Assessment - 02/22/15 0938    Subjective Patient is sore today and does feel better after exercise. She is concerned about getting enough protein to build muscle.    Patient Stated Goals would like to go up stairs and walk without pain    Currently in Pain? Yes   Pain Score 2    Pain Location Hip   Pain Orientation Right   Pain Descriptors / Indicators Other (Comment)  catching pain   Pain Type Chronic pain           [facility] Adult PT Treatment/Exercise - 02/22/15 0949    Exercises   Exercises Other Exercises   Other Exercises  With assistance performed supine hamstring stretch, ER right hip, inferior distraction right hip x 5 reps grade 1-2, hook lying hip stabilization with resistive band around thighs with verbal cues x 15 reps, side lying clam with manual graded resistance x 5 reps each LE, hip abduction x  5 reps, prone lying hip extension x 5 reps  each, leg press with ball between knees for correct hip/knee alignment 25# x 15 reps, standing hip exercises at [employer] rotary hip machine for hip extension, adduction, abduction x 10 reps each LE with graded resistance as tolerated 55# to 85#, step ups with 2# weights held in hands 10 reps each LE forward and lateral step ups with verbal cuing         Patient response to treatment: required verbal cuing and demonstration to perform exercises on hip rotary machine. She required tactile and verbal cues for sidelying and prone exercises as well for correct hip/knee alignment which improved with repetition, patient reported decreased pain with hip distraction and exercises performed on treatment table with no pain on standing and weight bearing following exercises         PT Education - 02/22/15 1020    Education provided Yes   Education Details reinforced home exercises with addition of using resistive band for hip exercises in lying supine/sidelying and prone   Person(s) Educated Patient   Methods Explanation;Demonstration;Verbal cues   Comprehension Verbalized understanding;Returned demonstration;Verbal cues required             PT Long Term Goals - 02/14/15 1030    PT LONG TERM GOAL #1   Title Patient will demonstrate improved  strength and functional use right LE without pain in right hip for climbing stairs by 03/28/2015   Baseline unable to climb stair without increased right hip pain   Status New   PT LONG TERM GOAL #2   Title Patient will improve self perceived disabiltiy with right LE as demonstrated by LEFS improved to 60/80 0r better by 03/28/2015   Baseline LEFS 53/80   Status New   PT LONG TERM GOAL #3   Title Patient will be independent with home program for self management of symptoms/strength and flexiblity exercises by 03/28/2015 in order to continue to imrpove to full function without right hip pain   Baseline patient has limited knowledge of appropriate exercises  for strength, flexibility, motor control    Status New               Plan - 02/22/15 1021    Clinical Impression Statement Patient demonstrated good technique with all exercises with minimal verbal cuing and with tactile cues for correct hip and knee alignment for sidelying and prone exercises. She continues with right hip weakness that improved following exercises and continues to contribute to hip pain and decreased abiltiy to walk up steps and put weight on right LE without increased pain.    Pt will benefit from skilled therapeutic intervention in order to improve on the following deficits Impaired perceived functional ability;Impaired flexibility;Decreased strength   Rehab Potential Good   PT Frequency 2x / week   PT Duration 6 weeks   PT Treatment/Interventions Therapeutic exercise;Electrical Stimulation;Patient/family education;Manual techniques   PT Next Visit Plan review home exercises given and progress with exercises for strength and motor control right hip, pain control as indicated        Problem List Patient Active Problem List   Diagnosis Date Noted  . Pain of both thumbs 01/14/2015  . Right hip pain 01/10/2015  . Health care maintenance 09/04/2014  . Abnormal mammogram 09/25/2013  . Abdominal pain 02/22/2013  . Anemia 05/01/2012  . GERD (gastroesophageal reflux disease) 05/01/2012  . Hematuria 05/01/2012  . Hypothyroidism 05/01/2012  . Hypercholesterolemia 05/01/2012  . Hypertension 05/01/2012    Jomarie Longs PT 02/23/2015, 3:03 PM  [place] [facility] [address] [place], [address] Phone: [phone]   Fax:  [phone]

## 2015-02-26 ENCOUNTER — Ambulatory Visit: Payer: Medicare Other | Admitting: Physical Therapy

## 2015-02-26 ENCOUNTER — Encounter: Payer: Self-pay | Admitting: Physical Therapy

## 2015-02-26 DIAGNOSIS — M6281 Muscle weakness (generalized): Secondary | ICD-10-CM

## 2015-02-26 DIAGNOSIS — M25551 Pain in right hip: Secondary | ICD-10-CM | POA: Diagnosis not present

## 2015-02-26 NOTE — Therapy (Signed)
[facility] [facility] [address], [place], [address] Phone: [phone]   Fax:  [phone]  Physical Therapy Treatment  Patient Details  Name: Savannah Cox MRN: [id number] Date of Birth: 1946-01-27 Referring Provider:  Einar Pheasant, MD  Encounter Date: 02/26/2015      PT End of Session - 02/26/15 1000    Visit Number 4   Number of Visits 12   Date for PT Re-Evaluation 03/28/15   Authorization Type 4   Authorization Time Period 10   PT Start Time 0914   PT Stop Time 0950   PT Time Calculation (min) 36 min   Activity Tolerance Patient tolerated treatment well   Behavior During Therapy [facility] for tasks assessed/performed      Past Medical History  Diagnosis Date  . Hypertension   . Hypercholesterolemia   . Hypothyroidism     Past Surgical History  Procedure Laterality Date  . Breast biopsy      left, benign    There were no vitals filed for this visit.  Visit Diagnosis:  Muscle weakness of lower extremity  Right hip pain      Subjective Assessment - 02/26/15 0919    Subjective Patient reports she has intermittent pain with walking and going up/down steps now before it was constant. Pain today: none on arrival in right hip     Gait; WNL's for weight shifting/heel/toe pattern, mild limp on left noted Strength: decreased right hip ER and abduction and extension strength 50% as compared to left LE       [facility] Adult PT Treatment/Exercise - 02/26/15 1631    Exercises   Exercises Other Exercises   Other Exercises  Supine: hamstring stretching, long axis hip distraction x 3 reps grade 2-3 prior to exercise: assisted with verbal and tactile cues: side lying clam with manual graded resistance x 10 reps each LE, hip abduction (with manual resistance and tactile cues for good alignment of hip/knee) x  10 reps, prone lying hip extension x 10 reps each, leg press with ball between knees for correct  hip/knee alignment 45# 2 x 15 reps, standing hip exercises at [employer] rotary hip machine for hip extension, adduction, abduction x 10 reps each LE with graded resistance as tolerated 70#, hip abduction 40# x 10 reps each LE,  NuStep x 11 min. (independent @ level #3 average 65-70 steps per min.)      Patient response to treatment: required assistance for correct alignment of hip/knee for side lying and hip standing exercises, improved strength noted with ability to tolerate increased resistance with manual resistance today to moderate intensity, good technique on standing hip exercises with minimal verbal cuing, no pain at end of session           PT Education - 02/26/15 0935    Education provided Yes   Education Details Discussed use of treadmill for exerise and advised to try bike untill right hip feels better with weight bearing such as walking   Person(s) Educated Patient   Methods Explanation   Comprehension Verbalized understanding             PT Long Term Goals - 02/14/15 1030    PT LONG TERM GOAL #1   Title Patient will demonstrate improved strength and functional use right LE without pain in right hip for climbing stairs by 03/28/2015   Baseline unable to climb stair without increased right hip pain   Status New  PT LONG TERM GOAL #2   Title Patient will improve self perceived disabiltiy with right LE as demonstrated by LEFS improved to 60/80 0r better by 03/28/2015   Baseline LEFS 53/80   Status New   PT LONG TERM GOAL #3   Title Patient will be independent with home program for self management of symptoms/strength and flexiblity exercises by 03/28/2015 in order to continue to imrpove to full function without right hip pain   Baseline patient has limited knowledge of appropriate exercises for strength, flexibility, motor control    Status New               Plan - 02/26/15 0955    Clinical Impression Statement Patient demonstrates improvement with decreased  frequency of pain in right hip with weight bearing activities and improving strength as demonstrated during exercises today. She continues with weakness in right hip musclulature that is limiting her abilty to perform walking and stair climbing without pain and difficulty and willl therefore benefit from continued physical therapy intervention to achive goals.    Pt will benefit from skilled therapeutic intervention in order to improve on the following deficits Impaired perceived functional ability;Impaired flexibility;Decreased strength   Rehab Potential Good   PT Frequency 2x / week   PT Duration 6 weeks   PT Treatment/Interventions Therapeutic exercise;Electrical Stimulation;Patient/family education;Manual techniques   PT Next Visit Plan balance, strength, ROM instructed exercises to improve walking without pain in right hip        Problem List Patient Active Problem List   Diagnosis Date Noted  . Pain of both thumbs 01/14/2015  . Right hip pain 01/10/2015  . Health care maintenance 09/04/2014  . Abnormal mammogram 09/25/2013  . Abdominal pain 02/22/2013  . Anemia 05/01/2012  . GERD (gastroesophageal reflux disease) 05/01/2012  . Hematuria 05/01/2012  . Hypothyroidism 05/01/2012  . Hypercholesterolemia 05/01/2012  . Hypertension 05/01/2012    Jomarie Longs PT 02/26/2015, 4:35 PM  [place] [facility] [address], [place], [address] Phone: [phone]   Fax:  [phone]

## 2015-03-01 ENCOUNTER — Encounter: Payer: Self-pay | Admitting: Physical Therapy

## 2015-03-01 ENCOUNTER — Ambulatory Visit: Payer: Medicare Other | Admitting: Physical Therapy

## 2015-03-01 DIAGNOSIS — M25551 Pain in right hip: Secondary | ICD-10-CM | POA: Diagnosis not present

## 2015-03-01 DIAGNOSIS — M6281 Muscle weakness (generalized): Secondary | ICD-10-CM

## 2015-03-01 NOTE — Therapy (Signed)
[place] [facility] [address] [place], [address] Phone: [phone]   Fax:  [phone]  Physical Therapy Treatment  Patient Details  Name: Savannah Cox MRN: [id number] Date of Birth: [birth year] Referring Provider:  Einar Pheasant, MD  Encounter Date: 03/01/2015      PT End of Session - 03/01/15 0847    Visit Number 5   Number of Visits 12   Date for PT Re-Evaluation 03/28/15   Authorization Type 5   Authorization Time Period 10   PT Start Time 0832   PT Stop Time 0925   PT Time Calculation (min) 53 min   Activity Tolerance Patient tolerated treatment well   Behavior During Therapy [facility] for tasks assessed/performed      Past Medical History  Diagnosis Date  . Hypertension   . Hypercholesterolemia   . Hypothyroidism     Past Surgical History  Procedure Laterality Date  . Breast biopsy      left, benign    There were no vitals filed for this visit.  Visit Diagnosis:  Muscle weakness of lower extremity  Right hip pain      Subjective Assessment - 03/01/15 0833    Subjective Patient reports she is doing much better and only has mild symptoms in her right hip with weight bearing activities.    Patient Stated Goals would like to go up stairs and walk without pain    Currently in Pain? No/denies      Objective: Palpation: + tenderness and spasms palpable along right hip/piriformis and gluteal muscles Strength: decreased right hip extension 3+/5, abduction 4-/5, ER 4-/5 with pain reported with resistance into ER Gait: ambulating without assistive device with increased cadence as compared to previous session with decreased right hip pain per patient report       [facility] Adult PT Treatment/Exercise - 03/01/15 0905    Exercises   Exercises Other Exercises   Other Exercises  Instructed and performed hip stretches figure 4 and piriformis with assistance x 3 x 10 second holds each followed by  guided exercises: bridging in supine with ball between with verbal cues x 10 reps, hip abduction x  10 reps with resistive band around thighs (blue), prone lying hip extension x 10 reps each with tactile cues to keep pelvis level, leg press with ball between knees for correct hip/knee alignment 45# 2 x 15 reps, standing hip exercises at MATRIX rotary hip machine for hip extension, adduction, abduction x 10 reps each LE with graded resistance as tolerated 70#, hip abduction 40# x 10 reps each LE, step ups with 2# weights held in hands 10 reps each LE forward and lateral step ups, side step along foam balance beam x 2 min. for stability activity, single leg stand with one leg on balance stone or BOSU ball 15 tosses with each LE.  NuStep x 11 min. (independent @ level #3 average 65-70 steps per min.)     Manual techniques: STM performed to right hip piriformis/gluteal muscles with patient in side lying, hip distraction performed to right hip with patient supine lying x 5 reps grade 2-3 long axis    Patient response to treatment: Patient demonstrated improved soft tissue elasticity with decreased tenderness following STM right hip musculature, required assistance for correct alignment of hip/knee for stretches and for hip standing exercises, improved strength noted with ability to tolerate increased resistance with leg press from 25 to 45# and with hip exercises today to  moderate intensity, good technique on standing hip exercises with minimal verbal cuing, no pain at end of session         PT Education - 03/01/15 0847    Education provided Yes   Education Details educated in proper stretching for hip piriformis and gluteal muscles   Person(s) Educated Patient   Methods Explanation;Demonstration;Verbal cues;Tactile cues;Handout   Comprehension Verbalized understanding;Returned demonstration;Tactile cues required;Verbal cues required             PT Long Term Goals - 02/14/15 1030    PT LONG  TERM GOAL #1   Title Patient will demonstrate improved strength and functional use right LE without pain in right hip for climbing stairs by 03/28/2015   Baseline unable to climb stair without increased right hip pain   Status New   PT LONG TERM GOAL #2   Title Patient will improve self perceived disabiltiy with right LE as demonstrated by LEFS improved to 60/80 0r better by 03/28/2015   Baseline LEFS 53/80   Status New   PT LONG TERM GOAL #3   Title Patient will be independent with home program for self management of symptoms/strength and flexiblity exercises by 03/28/2015 in order to continue to imrpove to full function without right hip pain   Baseline patient has limited knowledge of appropriate exercises for strength, flexibility, motor control    Status New               Plan - 03/01/15 0930    Clinical Impression Statement Patient is progressing with all goals. She continues with mild pain in right hip with weakness that limits her with stair climbing and walking. She responded well to manual techniques and guided exercises and should continue to improve with further physical therapy intervention.   Pt will benefit from skilled therapeutic intervention in order to improve on the following deficits Impaired perceived functional ability;Impaired flexibility;Decreased strength   PT Frequency 2x / week   PT Duration 6 weeks   PT Treatment/Interventions Therapeutic exercise;Electrical Stimulation;Patient/family education;Manual techniques   PT Next Visit Plan balance, strength, ROM instructed exercises to improve walking without pain in right hip        Problem List Patient Active Problem List   Diagnosis Date Noted  . Pain of both thumbs 01/14/2015  . Right hip pain 01/10/2015  . Health care maintenance 09/04/2014  . Abnormal mammogram 09/25/2013  . Abdominal pain 02/22/2013  . Anemia 05/01/2012  . GERD (gastroesophageal reflux disease) 05/01/2012  . Hematuria  05/01/2012  . Hypothyroidism 05/01/2012  . Hypercholesterolemia 05/01/2012  . Hypertension 05/01/2012    Jomarie Longs PT 03/01/2015, 3:02 PM  [place] [facility] [address] [place], [address] Phone: [phone]   Fax:  [phone]

## 2015-03-02 ENCOUNTER — Encounter: Payer: Medicare Other | Admitting: Physical Therapy

## 2015-03-05 ENCOUNTER — Ambulatory Visit: Payer: Medicare Other | Admitting: Physical Therapy

## 2015-03-05 ENCOUNTER — Encounter: Payer: Self-pay | Admitting: Physical Therapy

## 2015-03-05 DIAGNOSIS — M25551 Pain in right hip: Secondary | ICD-10-CM | POA: Diagnosis not present

## 2015-03-05 DIAGNOSIS — M6281 Muscle weakness (generalized): Secondary | ICD-10-CM

## 2015-03-05 NOTE — Therapy (Signed)
[place] [facility] [address], [place], [address] Phone: [phone]   Fax:  [phone]  Physical Therapy Treatment  Patient Details  Name: Savannah Cox MRN: [id number] Date of Birth: [birth year] Referring Provider:  Einar Pheasant, MD  Encounter Date: 03/05/2015      PT End of Session - 03/05/15 1131    Visit Number 6   Number of Visits 12   Date for PT Re-Evaluation 03/28/15   Authorization Type 6   Authorization Time Period 10   PT Start Time 0815   PT Stop Time 0901   PT Time Calculation (min) 46 min   Activity Tolerance Patient tolerated treatment well   Behavior During Therapy [facility] for tasks assessed/performed      Past Medical History  Diagnosis Date  . Hypertension   . Hypercholesterolemia   . Hypothyroidism     Past Surgical History  Procedure Laterality Date  . Breast biopsy      left, benign    There were no vitals filed for this visit.  Visit Diagnosis:  Muscle weakness of lower extremity  Right hip pain      Subjective Assessment - 03/05/15 0816    Subjective Patient reports she is doing much better and only has mild symptoms in her right hip with weight bearing activities. Much stronginer in right hip with less tenderness.    Patient Stated Goals would like to go up stairs and walk without pain    Currently in Pain? No/denies           Objective: Palpation: + tenderness and spasms palpable along right hip/piriformis and gluteal muscles, decreased as compared to previous sessioin Strength: decreased right hip extension 4-/5, abduction 4-/5, ER 4-/5, no pain reported with resistive ER today Gait: ambulating without assistive device with increased cadence as compared to previous session with decreased right hip pain per patient report          [facility] Adult PT Treatment/Exercise - 03/05/15 0817    Exercises   Exercises Other Exercises   Other Exercises  with  assistance of therapist performed hip stretches figure 4 and piriformis with assistance x 3 x 10 second holds each followed by guided exercises: bridging in supine with ball between with verbal cues x 10 reps, hip abduction x 10 reps with resistive band around thighs, doubled today (blue), prone lying hip extension x 10 reps each with improved control, strength and good alignment of hip and pelvis, leg press with ball between knees for correct hip/knee alignment 45# 1 x 15 reps, total gym partial squats with ball between knees x 15 reps, standing on balance pad and one foot on large balance stone ball toss x 15 reps each,  standing hip exercises at MATRIX rotary hip machine for hip extension, adduction, abduction x 10 reps each LE with graded resistance as tolerated 85#, hip abduction 55# x 10 reps each LE,  NuStep x 10 min. (independent @ level #3 average 65-70 steps per min.)          Manual techniques: STM performed to right hip piriformis/gluteal muscles with patient in side lying, hip distraction performed to right hip with patient supine lying x 5 reps grade 2-3 long axis    Patient response to treatment: Patient demonstrated improved soft tissue elasticity with decreased tenderness following STM right hip musculature, required assistance for correct alignment of hip/knee for stretches and for hip standing exercises, improved strength noted with ability  to tolerate increased resistance with leg press with more controlled motion at 45# and with hip exercises today with improved control of motion with good technique on standing hip exercises with minimal verbal cuing, no pain at end of session         PT Education - 03/05/15 1130    Education provided Yes   Education Details educated in proper positiong on hip/knee for side llying hip abduciton/clam with good isolation at hip, tactile and verbal cuing required   Person(s) Educated Patient   Methods Explanation;Tactile cues;Verbal cues    Comprehension Verbalized understanding;Returned demonstration;Verbal cues required;Tactile cues required             PT Long Term Goals - 02/14/15 1030    PT LONG TERM GOAL #1   Title Patient will demonstrate improved strength and functional use right LE without pain in right hip for climbing stairs by 03/28/2015   Baseline unable to climb stair without increased right hip pain   Status New   PT LONG TERM GOAL #2   Title Patient will improve self perceived disabiltiy with right LE as demonstrated by LEFS improved to 60/80 0r better by 03/28/2015   Baseline LEFS 53/80   Status New   PT LONG TERM GOAL #3   Title Patient will be independent with home program for self management of symptoms/strength and flexiblity exercises by 03/28/2015 in order to continue to imrpove to full function without right hip pain   Baseline patient has limited knowledge of appropriate exercises for strength, flexibility, motor control    Status New               Plan - 03/05/15 1131    Clinical Impression Statement Paitent is progressing with all goals and is improving with strength as demonstrated with incresaed intensity of exercises for hip strengthening on rotary hip machine. She is able to control her motion with less effort and demonstrates improved understanding of home program and exercises.    Pt will benefit from skilled therapeutic intervention in order to improve on the following deficits Impaired perceived functional ability;Impaired flexibility;Decreased strength   Rehab Potential Good   PT Frequency 2x / week   PT Duration 6 weeks   PT Treatment/Interventions Therapeutic exercise;Electrical Stimulation;Patient/family education;Manual techniques   PT Next Visit Plan balance, strength, ROM instructed exercises to improve walking without pain in right hip        Problem List Patient Active Problem List   Diagnosis Date Noted  . Pain of both thumbs 01/14/2015  . Right hip pain  01/10/2015  . Health care maintenance 09/04/2014  . Abnormal mammogram 09/25/2013  . Abdominal pain 02/22/2013  . Anemia 05/01/2012  . GERD (gastroesophageal reflux disease) 05/01/2012  . Hematuria 05/01/2012  . Hypothyroidism 05/01/2012  . Hypercholesterolemia 05/01/2012  . Hypertension 05/01/2012    Jomarie Longs PT 03/05/2015, 11:34 AM  [place] [facility] [address] [place], [address] Phone: [phone]   Fax:  [phone]

## 2015-03-07 ENCOUNTER — Other Ambulatory Visit: Payer: Self-pay | Admitting: Internal Medicine

## 2015-03-12 ENCOUNTER — Ambulatory Visit: Payer: Medicare Other | Admitting: Physical Therapy

## 2015-03-12 ENCOUNTER — Encounter: Payer: Self-pay | Admitting: Physical Therapy

## 2015-03-12 DIAGNOSIS — M6281 Muscle weakness (generalized): Secondary | ICD-10-CM

## 2015-03-12 DIAGNOSIS — M25551 Pain in right hip: Secondary | ICD-10-CM | POA: Diagnosis not present

## 2015-03-12 NOTE — Therapy (Signed)
[place] [facility] [address], [place], [address] Phone: [phone]   Fax:  [phone]  Physical Therapy Treatment  Patient Details  Name: Savannah Cox MRN: [id number] Date of Birth: 05-05-1946 Referring Provider:  Einar Pheasant, MD  Encounter Date: 03/12/2015      PT End of Session - 03/12/15 0819    Visit Number 7   Number of Visits 12   Date for PT Re-Evaluation 03/28/15   Authorization Type 7   Authorization Time Period 10   PT Start Time 0815   PT Stop Time 0910   PT Time Calculation (min) 55 min   Activity Tolerance Patient tolerated treatment well   Behavior During Therapy [facility] for tasks assessed/performed      Past Medical History  Diagnosis Date  . Hypertension   . Hypercholesterolemia   . Hypothyroidism     Past Surgical History  Procedure Laterality Date  . Breast biopsy      left, benign    There were no vitals filed for this visit.  Visit Diagnosis:  Muscle weakness of lower extremity  Right hip pain      Subjective Assessment - 03/12/15 0816    Subjective Patient reports she is doing much better and only has mild symptoms in her right hip with weight bearing activities. [facility] to use [facility] stationary bike x 25 min. and is still not sure of how to use the hip machine. She would like more instruction.    Patient Stated Goals would like to go up stairs and walk without pain    Currently in Pain? No/denies       Objective: Strength: decreased right hip ER and abduction 4-/5, improving since beginning therapy session       [facility] Adult PT Treatment/Exercise - 03/12/15 0818    Exercises   Exercises Other Exercises   Other Exercises  with assistance of therapist performed hip stretches figure 4 and piriformis with assistance x 3 x 10 second holds each followed by guided exercises: bridging in supine with ball between with verbal cues x 10  reps, single leg bridge with 5# on pelvis x 3 reps each,  prone lying hip extension x 10 reps each with improved control, strength and good alignment of hip and pelvis, leg press with ball between knees for correct hip/knee alignment 45# 2 x 15 reps, total gym partial squats with ball between knees x 15 reps, single leg partial squats x 10 reps each LE, standing on balance pad and one foot on large balance stone ball toss x 15 reps each, standing hip exercises at MATRIX rotary hip machine for hip extension, abduction, adduction x 10 reps each LE with graded resistance as tolerated 70#, hip abduction 55# x 10 reps each LE,  NuStep x 10 min. (independent @ level #3 average 65-70 steps per min.)        Manual techniques:  hip distraction performed to right hip with patient supine lying x 5 reps grade 2-3 long axis   Patient response to treatment:  required assistance for correct alignment of hip/knee for stretches and for hip standing exercises, improved strength noted with ability to tolerate increased resistance with leg press with more controlled motion at 45# and with hip exercises today with improved control of motion with good technique on standing hip exercises with minimal verbal cuing, no pain reported at end of session  PT Education - 03/12/15 0830    Education provided Yes   Education Details instructed in proper alignment of hip and knee to perform exercises with correct muscle engaged for strengthening in side lying and standing   Person(s) Educated Patient   Methods Explanation;Verbal cues;Tactile cues   Comprehension Verbalized understanding;Returned demonstration;Tactile cues required;Verbal cues required             PT Long Term Goals - 02/14/15 1030    PT LONG TERM GOAL #1   Title Patient will demonstrate improved strength and functional use right LE without pain in right hip for climbing stairs by 03/28/2015   Baseline unable to climb stair without  increased right hip pain   Status New   PT LONG TERM GOAL #2   Title Patient will improve self perceived disabiltiy with right LE as demonstrated by LEFS improved to 60/80 0r better by 03/28/2015   Baseline LEFS 53/80   Status New   PT LONG TERM GOAL #3   Title Patient will be independent with home program for self management of symptoms/strength and flexiblity exercises by 03/28/2015 in order to continue to imrpove to full function without right hip pain   Baseline patient has limited knowledge of appropriate exercises for strength, flexibility, motor control    Status New               Plan - 03/12/15 0915    Clinical Impression Statement Patient is progressing with all goasl and demonstrates improving knowledge of exercises and proper alignment of right hip/knee and will benefit from additional physical therapy intervention to further strengthen hip muscles in order to walk and climb stairs without pain   Pt will benefit from skilled therapeutic intervention in order to improve on the following deficits Impaired perceived functional ability;Impaired flexibility;Decreased strength   Rehab Potential Good   PT Frequency 2x / week   PT Duration 6 weeks   PT Treatment/Interventions Therapeutic exercise;Electrical Stimulation;Patient/family education;Manual techniques   PT Next Visit Plan balance, strength, ROM instructed exercises to improve walking without pain in right hip        Problem List Patient Active Problem List   Diagnosis Date Noted  . Pain of both thumbs 01/14/2015  . Right hip pain 01/10/2015  . Health care maintenance 09/04/2014  . Abnormal mammogram 09/25/2013  . Abdominal pain 02/22/2013  . Anemia 05/01/2012  . GERD (gastroesophageal reflux disease) 05/01/2012  . Hematuria 05/01/2012  . Hypothyroidism 05/01/2012  . Hypercholesterolemia 05/01/2012  . Hypertension 05/01/2012    Jomarie Longs PT 03/12/2015, 8:56 PM  [place] [facility] [address] [place], [address] Phone: [phone]   Fax:  [phone]

## 2015-03-15 ENCOUNTER — Encounter: Payer: Self-pay | Admitting: Physical Therapy

## 2015-03-15 ENCOUNTER — Ambulatory Visit: Payer: Medicare Other | Admitting: Physical Therapy

## 2015-03-15 DIAGNOSIS — M6281 Muscle weakness (generalized): Secondary | ICD-10-CM

## 2015-03-15 DIAGNOSIS — M25551 Pain in right hip: Secondary | ICD-10-CM

## 2015-03-15 NOTE — Therapy (Signed)
[place] [facility] [address] [place], [address] Phone: [phone]   Fax:  [phone]  Physical Therapy Treatment  Patient Details  Name: Savannah Cox MRN: [id number] Date of Birth: [birth year] Referring Provider:  Einar Pheasant, MD  Encounter Date: 03/15/2015      PT End of Session - 03/15/15 1200    Visit Number 8   Number of Visits 12   Date for PT Re-Evaluation 03/28/15   Authorization Type 8   Authorization Time Period 10   PT Start Time 1111   PT Stop Time 1200   PT Time Calculation (min) 49 min   Activity Tolerance Patient tolerated treatment well   Behavior During Therapy [facility] for tasks assessed/performed      Past Medical History  Diagnosis Date  . Hypertension   . Hypercholesterolemia   . Hypothyroidism     Past Surgical History  Procedure Laterality Date  . Breast biopsy      left, benign    There were no vitals filed for this visit.  Visit Diagnosis:  Muscle weakness of lower extremity  Right hip pain      Subjective Assessment - 03/15/15 1112    Subjective Paitent reports she is still improving and she is a little sore from maybe over exercising: she exercised 2 days in a row.    Patient Stated Goals would like to go up stairs and walk without pain    Currently in Pain? Other (Comment)  Just a little sore in right hip        Objective: Palpation: right hip posterior/lateral aspect with + increased tenderness and spasms palpable Resistive abduction reproduced pain/soreness in right hip       [facility] Adult PT Treatment/Exercise - 03/15/15 1119    Exercises   Exercises Other Exercises   Other Exercises  side lying clam with manual graded resistance x 10 reps each LE, hip abduction x  10 reps, prone lying hip extension x 10 reps each with green resisitve band around thighs, leg press with ball between knees for correct hip/knee alignment 45# 2 x 15 reps, standing hip  exercises at MATRIX rotary hip machine for hip extension, adduction, abduction x 10 reps each LE with graded resistance as tolerated 70#, hip abduction 40# x 10 reps each LE; all performed with verbal cuing and tactile cues of PT     Manual therapy: STM superficial and Trp techniques performed to lateral and posterior aspect of right hip with patient in side lying left side for reduction of spasms and pain and improving soft tissue elasticity to perform exercises with improved control    Patient response to treatment: decreased spasms to none palpable with decreased tenderness to mild following STM and able to take resistance to hip abduction without reproduction of symptoms, improved ability to perform exercises with improved control of right LE, required verbal cues and assistance to perform exercises with correct posture/alignment of hip and knee        PT Education - 03/15/15 1132    Education provided Yes   Education Details Instructed in using resistive band around thighs for hip extension in prone lying, re enforced exercises for home to be performed every other day to avoid soreness, verbal and tactile cues given for proper alignment and technique for exercises perform during session   Person(s) Educated Patient   Methods Explanation;Demonstration;Verbal cues   Comprehension Verbalized understanding;Returned demonstration;Verbal cues required  PT Long Term Goals - 02/14/15 1030    PT LONG TERM GOAL #1   Title Patient will demonstrate improved strength and functional use right LE without pain in right hip for climbing stairs by 03/28/2015   Baseline unable to climb stair without increased right hip pain   Status New   PT LONG TERM GOAL #2   Title Patient will improve self perceived disabiltiy with right LE as demonstrated by LEFS improved to 60/80 0r better by 03/28/2015   Baseline LEFS 53/80   Status New   PT LONG TERM GOAL #3   Title Patient will be independent  with home program for self management of symptoms/strength and flexiblity exercises by 03/28/2015 in order to continue to imrpove to full function without right hip pain   Baseline patient has limited knowledge of appropriate exercises for strength, flexibility, motor control    Status New               Plan - 03/15/15 1200    Clinical Impression Statement Patient continues to demonstrate improvement with ability to walk and go up/down stairs with less difficulty. She had decreased spasms in latera/posterior right hip following STM and was able to perform exercises with assistance and verbal cuing. She continues with weakness in right hip and will benefit from additional physical therapy intervention to address limitations.   Pt will benefit from skilled therapeutic intervention in order to improve on the following deficits Impaired perceived functional ability;Impaired flexibility;Decreased strength   Rehab Potential Good   PT Frequency 2x / week   PT Duration 6 weeks   PT Treatment/Interventions Therapeutic exercise;Electrical Stimulation;Patient/family education;Manual techniques   PT Next Visit Plan balance, strength, ROM instructed exercises to improve walking without pain in right hip        Problem List Patient Active Problem List   Diagnosis Date Noted  . Pain of both thumbs 01/14/2015  . Right hip pain 01/10/2015  . Health care maintenance 09/04/2014  . Abnormal mammogram 09/25/2013  . Abdominal pain 02/22/2013  . Anemia 05/01/2012  . GERD (gastroesophageal reflux disease) 05/01/2012  . Hematuria 05/01/2012  . Hypothyroidism 05/01/2012  . Hypercholesterolemia 05/01/2012  . Hypertension 05/01/2012    Jomarie Longs PT 03/15/2015, 7:08 PM  [place] [facility] [address] [place], [address] Phone: [phone]   Fax:  [phone]

## 2015-03-21 ENCOUNTER — Ambulatory Visit: Payer: Medicare Other | Attending: Internal Medicine | Admitting: Physical Therapy

## 2015-03-21 ENCOUNTER — Encounter: Payer: Self-pay | Admitting: Physical Therapy

## 2015-03-21 DIAGNOSIS — M25551 Pain in right hip: Secondary | ICD-10-CM | POA: Insufficient documentation

## 2015-03-21 DIAGNOSIS — M6281 Muscle weakness (generalized): Secondary | ICD-10-CM | POA: Insufficient documentation

## 2015-03-21 NOTE — Therapy (Signed)
[facility] [address], [place], [address] Phone: [phone]   Fax:  [phone]  Physical Therapy Treatment  Patient Details  Name: Savannah Cox MRN: [id number] Date of Birth: 02/06/1946 Referring Provider:  Einar Pheasant, MD  Encounter Date: 03/21/2015      PT End of Session - 03/21/15 0855    Visit Number 9   Number of Visits 12   Date for PT Re-Evaluation 03/28/15   Authorization Type 9   Authorization Time Period 10   PT Start Time 0847   PT Stop Time 0930   PT Time Calculation (min) 43 min   Activity Tolerance Patient tolerated treatment well   Behavior During Therapy [facility] for tasks assessed/performed      Past Medical History  Diagnosis Date  . Hypertension   . Hypercholesterolemia   . Hypothyroidism     Past Surgical History  Procedure Laterality Date  . Breast biopsy      left, benign    There were no vitals filed for this visit.  Visit Diagnosis:  Muscle weakness of lower extremity  Right hip pain      Subjective Assessment - 03/21/15 0853    Subjective Paitent reports she is still improving and she is a little sore from maybe over exercising: she is doing more at home and up on feet more now.    Patient Stated Goals would like to go up stairs and walk without pain    Currently in Pain? Other (Comment)  a little sore in right hip   Pain Onset More than a month ago       Objective: Palpation: right hip + spasms gluteus medius and piriformis Strength: decreased right hip ER and abduction as compared to left hips       [facility] Adult PT Treatment/Exercise - 03/21/15 0855    Exercises   Exercises Other Exercises   Other Exercises  side lying clam with manual graded resistance x 10 reps each LE, hip abduction x 10 reps, prone lying hip extension x 10 reps, leg press with ball between knees for correct hip/knee alignment 45# 2 x 15 reps, standing hip exercises at  MATRIX rotary hip machine for hip extension, adduction, abduction x 10 reps each LE with graded resistance as tolerated 70#, hip abduction 40# x 10 reps each LE; all performed with verbal cuing and tactile cues of PT Nustep(independent @ level #3 average 65-70 steps per min.)      STM performed to right hip gluteal muscles and piriformis with patient in side lying left to decrease Trp and improve soft tissue elasticity, long axis distraction right hip, 5 reps  Grade 2-3 for pain control   Patient response to treatment: decreased spasms and tenderness following STM, improved strength and alignment of hip/knee during exercises with repetition and verbal cues.             PT Education - 03/21/15 [phone]    Education provided Yes   Education Details verbal cuing to perform exercises with proper technique   Person(s) Educated Patient   Methods Explanation;Verbal cues   Comprehension Verbalized understanding;Verbal cues required;Returned demonstration             PT Long Term Goals - 02/14/15 1030    PT LONG TERM GOAL #1   Title Patient will demonstrate improved strength and functional use right LE without pain in right hip for climbing stairs by 03/28/2015   Baseline unable  to climb stair without increased right hip pain   Status New   PT LONG TERM GOAL #2   Title Patient will improve self perceived disabiltiy with right LE as demonstrated by LEFS improved to 60/80 0r better by 03/28/2015   Baseline LEFS 53/80   Status New   PT LONG TERM GOAL #3   Title Patient will be independent with home program for self management of symptoms/strength and flexiblity exercises by 03/28/2015 in order to continue to imrpove to full function without right hip pain   Baseline patient has limited knowledge of appropriate exercises for strength, flexibility, motor control    Status New               Plan - 03/21/15 0931    Clinical Impression Statement Patient continues to demonstrate  improvement with ability to walk and perform daily tasks with less difficutly and pain in right hip.    Pt will benefit from skilled therapeutic intervention in order to improve on the following deficits Impaired perceived functional ability;Impaired flexibility;Decreased strength   Rehab Potential Good   PT Frequency 2x / week   PT Duration 6 weeks   PT Treatment/Interventions Therapeutic exercise;Electrical Stimulation;Patient/family education;Manual techniques   PT Next Visit Plan balance, strength, ROM instructed exercises to improve walking without pain in right hip        Problem List Patient Active Problem List   Diagnosis Date Noted  . Pain of both thumbs 01/14/2015  . Right hip pain 01/10/2015  . Health care maintenance 09/04/2014  . Abnormal mammogram 09/25/2013  . Abdominal pain 02/22/2013  . Anemia 05/01/2012  . GERD (gastroesophageal reflux disease) 05/01/2012  . Hematuria 05/01/2012  . Hypothyroidism 05/01/2012  . Hypercholesterolemia 05/01/2012  . Hypertension 05/01/2012    Jomarie Longs PT 03/22/2015, 6:43 AM  [facility] [address] [place], [address] Phone: [phone]   Fax:  [phone]

## 2015-03-28 ENCOUNTER — Ambulatory Visit: Payer: Medicare Other | Admitting: Physical Therapy

## 2015-03-28 DIAGNOSIS — M6281 Muscle weakness (generalized): Secondary | ICD-10-CM

## 2015-03-28 DIAGNOSIS — M25551 Pain in right hip: Secondary | ICD-10-CM

## 2015-03-28 NOTE — Therapy (Signed)
[facility] [address] [place], [address] Phone: [phone]   Fax:  [phone]  Physical Therapy Treatment/Discharge Summary  Patient Details  Name: Savannah Cox MRN: [id number] Date of Birth: September 21, 1945 Referring Provider:  Einar Pheasant, MD  Encounter Date: 03/28/2015   Patient began physical therapy on 02/14/2015 and has been seen through 03/28/2015 She has achieved all goals and is ready for discharge to independent home program/self managment      PT End of Session - 03/28/15 0810    Visit Number 10   Number of Visits 12   Date for PT Re-Evaluation 03/28/15   Authorization Type 10   Authorization Time Period 10   PT Start Time 0804   PT Stop Time 0904   PT Time Calculation (min) 60 min   Activity Tolerance Patient tolerated treatment well   Behavior During Therapy [facility] for tasks assessed/performed      Past Medical History  Diagnosis Date  . Hypertension   . Hypercholesterolemia   . Hypothyroidism     Past Surgical History  Procedure Laterality Date  . Breast biopsy      left, benign    There were no vitals filed for this visit.  Visit Diagnosis:  Muscle weakness of lower extremity  Right hip pain      Subjective Assessment - 03/28/15 0808    Subjective Patient reports she is sore in right hip muscles today most likely due to [place] to [place] over the weekend: 5 hours each way. She is at least 75-80% improved since beginning physical therapy and feels that her right hip does not limit her with her daily tasks and walking, including up/down stairs. She agrees to discharge from physical therapy at this time and will continue with home exercises and going to the [facility].    Patient Stated Goals would like to go up stairs and walk without pain    Currently in Pain? Yes   Pain Score 3    Pain Location Hip   Pain Orientation Right   Pain Descriptors / Indicators Aching   Pain  Type Chronic pain   Pain Onset More than a month ago      Objective: Gait: WNL's without deviation, Palpation: mild tenderness posterior and lateral right hip, mild tissue tension, no TrPs noted Strength: right hip abduction 4+/5, ER 4+/5 as compared to left  LEFS: 72/80 (significant change noted from initial evaluation 53/80) only mild self perceived impairment at this time         [facility] Adult PT Treatment/Exercise - 03/28/15 0809    Exercises   Exercises Other Exercises   Other Exercises  side lying clam with manual graded resistance x 10 reps each LE, hip abduction x  10 reps, instructed in using stability ball for hip/knee flexion, bridging and hamstrign stretches to add to home program with handout given, leg press with ball between knees for correct hip/knee alignment 45# 2 x 15 reps, standing hip exercises at MATRIX rotary hip machine for hip extension, adduction, abduction x 10 reps each LE with graded resistance as tolerated 70#, hip abduction 40# x 15 reps each LE, step ups with 2# weights held in hands 10 reps each LE forward and lateral step ups, diagonal wedding march holding (2) 2# weights NuStep x 11 min. (unbilled @ level #3 average 65-70 steps per min.)     STM performed to right hip musculature with patient in left side lying and  long axis hip distraction x 5 reps each grade 2-3, moist heat x 10 min. Applied to right hip following STM followed by exercises  Patient response to treatment: good demonstration of exercises with minimal cuing for correct position of LE's for leg press and hip machine. Verbalizes good understanding of home program         PT Long Term Goals - 04-04-15 0824    PT LONG TERM GOAL #1   Title Patient will demonstrate improved strength and functional use right LE without pain in right hip for climbing stairs by 04/04/2015   Baseline unable to climb stair without increased right hip pain   Status Achieved   PT LONG TERM GOAL #2   Title  Patient will improve self perceived disabiltiy with right LE as demonstrated by LEFS improved to 60/80 0r better by 04/04/2015   Baseline LEFS 53/80 (current 04/04/2015 = 72/80: WFL's, mild self perceived disablity)   Status Achieved   PT LONG TERM GOAL #3   Title Patient will be independent with home program for self management of symptoms/strength and flexiblity exercises by 2015/04/04 in order to continue to imrpove to full function without right hip pain   Baseline patient has limited knowledge of appropriate exercises for strength, flexibility, motor control    Status Achieved               Plan - April 04, 2015 0816    Clinical Impression Statement Patient has demonstrated significant improvement with decreased pain and improved self perceived impairment based on LEFS score from 53 to 72. All goals have been achieved; plan discharge to independent home program   Pt will benefit from skilled therapeutic intervention in order to improve on the following deficits Impaired perceived functional ability;Impaired flexibility;Decreased strength   Rehab Potential Good   PT Frequency 2x / week   PT Duration 6 weeks   PT Treatment/Interventions Therapeutic exercise;Electrical Stimulation;Patient/family education;Manual techniques   PT Next Visit Plan Discharge to home program          G-Codes - 2015/04/04 9798    Mobility: Walking and Moving Around Goal Status [phone]) 0 percent impaired, limited or restricted   Mobility: Walking and Moving Around Discharge Status [phone]) 0 percent impaired, limited or restricted      Problem List Patient Active Problem List   Diagnosis Date Noted  . Pain of both thumbs 01/14/2015  . Right hip pain 01/10/2015  . Health care maintenance 09/04/2014  . Abnormal mammogram 09/25/2013  . Abdominal pain 02/22/2013  . Anemia 05/01/2012  . GERD (gastroesophageal reflux disease) 05/01/2012  . Hematuria 05/01/2012  . Hypothyroidism 05/01/2012  .  Hypercholesterolemia 05/01/2012  . Hypertension 05/01/2012    Jomarie Longs PT 04/04/2015, 9:54 PM  [place] [facility] [address], [place], [address] Phone: [phone]   Fax:  [phone]

## 2015-03-30 ENCOUNTER — Ambulatory Visit: Payer: Medicare Other | Admitting: Physical Therapy

## 2015-04-04 ENCOUNTER — Encounter: Payer: Medicare Other | Admitting: Physical Therapy

## 2015-04-06 ENCOUNTER — Encounter: Payer: Medicare Other | Admitting: Physical Therapy

## 2015-04-11 ENCOUNTER — Encounter: Payer: Medicare Other | Admitting: Physical Therapy

## 2015-04-13 ENCOUNTER — Encounter: Payer: Medicare Other | Admitting: Physical Therapy

## 2015-05-08 ENCOUNTER — Other Ambulatory Visit (INDEPENDENT_AMBULATORY_CARE_PROVIDER_SITE_OTHER): Payer: Medicare Other

## 2015-05-08 DIAGNOSIS — E039 Hypothyroidism, unspecified: Secondary | ICD-10-CM

## 2015-05-08 DIAGNOSIS — E7800 Pure hypercholesterolemia, unspecified: Secondary | ICD-10-CM

## 2015-05-08 DIAGNOSIS — I10 Essential (primary) hypertension: Secondary | ICD-10-CM | POA: Diagnosis not present

## 2015-05-08 LAB — HEPATIC FUNCTION PANEL
ALT: 25 U/L (ref 0–35)
AST: 36 U/L (ref 0–37)
Albumin: 3.9 g/dL (ref 3.5–5.2)
Alkaline Phosphatase: 73 U/L (ref 39–117)
BILIRUBIN DIRECT: 0 mg/dL (ref 0.0–0.3)
BILIRUBIN TOTAL: 0.4 mg/dL (ref 0.2–1.2)
Total Protein: 7 g/dL (ref 6.0–8.3)

## 2015-05-08 LAB — LIPID PANEL
CHOL/HDL RATIO: 3
Cholesterol: 202 mg/dL — ABNORMAL HIGH (ref 0–200)
HDL: 60.3 mg/dL (ref >39.00)
LDL CALC: 125 mg/dL — AB (ref 0–99)
NonHDL: 142.12
TRIGLYCERIDES: 85 mg/dL (ref 0.0–149.0)
VLDL: 17 mg/dL (ref 0.0–40.0)

## 2015-05-08 LAB — BASIC METABOLIC PANEL
BUN: 16 mg/dL (ref 6–23)
CALCIUM: 9.4 mg/dL (ref 8.4–10.5)
CO2: 27 mEq/L (ref 19–32)
Chloride: 104 mEq/L (ref 96–112)
Creatinine, Ser: 0.71 mg/dL (ref 0.40–1.20)
GFR: 87.16 mL/min (ref >60.00)
GLUCOSE: 95 mg/dL (ref 70–99)
POTASSIUM: 3.8 meq/L (ref 3.5–5.1)
Sodium: 139 mEq/L (ref 135–145)

## 2015-05-08 LAB — TSH: TSH: 3.99 u[IU]/mL (ref 0.35–4.50)

## 2015-05-09 ENCOUNTER — Encounter: Payer: Self-pay | Admitting: *Deleted

## 2015-05-09 ENCOUNTER — Encounter: Payer: Self-pay | Admitting: Internal Medicine

## 2015-05-14 ENCOUNTER — Ambulatory Visit (INDEPENDENT_AMBULATORY_CARE_PROVIDER_SITE_OTHER): Payer: Medicare Other | Admitting: Internal Medicine

## 2015-05-14 ENCOUNTER — Encounter: Payer: Self-pay | Admitting: Internal Medicine

## 2015-05-14 VITALS — BP 90/60 | HR 72 | Temp 98.3°F | Resp 18 | Ht 63.0 in | Wt 138.0 lb

## 2015-05-14 DIAGNOSIS — D649 Anemia, unspecified: Secondary | ICD-10-CM

## 2015-05-14 DIAGNOSIS — I10 Essential (primary) hypertension: Secondary | ICD-10-CM

## 2015-05-14 DIAGNOSIS — E039 Hypothyroidism, unspecified: Secondary | ICD-10-CM | POA: Diagnosis not present

## 2015-05-14 DIAGNOSIS — M25551 Pain in right hip: Secondary | ICD-10-CM

## 2015-05-14 DIAGNOSIS — Z23 Encounter for immunization: Secondary | ICD-10-CM | POA: Diagnosis not present

## 2015-05-14 DIAGNOSIS — K219 Gastro-esophageal reflux disease without esophagitis: Secondary | ICD-10-CM | POA: Diagnosis not present

## 2015-05-14 DIAGNOSIS — R928 Other abnormal and inconclusive findings on diagnostic imaging of breast: Secondary | ICD-10-CM | POA: Diagnosis not present

## 2015-05-14 DIAGNOSIS — E7800 Pure hypercholesterolemia, unspecified: Secondary | ICD-10-CM

## 2015-05-14 MED ORDER — LEVOTHYROXINE SODIUM 50 MCG PO TABS: 50.0000 ug | ORAL_TABLET | Freq: Every day | ORAL | Status: DC

## 2015-05-14 MED ORDER — ROSUVASTATIN CALCIUM 5 MG PO TABS: 5.0000 mg | ORAL_TABLET | ORAL | Status: DC

## 2015-05-14 MED ORDER — OMEPRAZOLE 20 MG PO CPDR: 20.0000 mg | DELAYED_RELEASE_CAPSULE | Freq: Every day | ORAL | Status: DC

## 2015-05-14 NOTE — Assessment & Plan Note (Signed)
On thyroid replacement.  Follow tsh.  Recent tsh wnl.  

## 2015-05-14 NOTE — Assessment & Plan Note (Signed)
Blood pressure under good control.  Continue same medication regimen.  Follow pressures.  Follow metabolic panel.   

## 2015-05-14 NOTE — Assessment & Plan Note (Signed)
Xray as outlined.  Not an issue today.

## 2015-05-14 NOTE — Progress Notes (Signed)
Pre-visit discussion using our clinic review tool. No additional management support is needed unless otherwise documented below in the visit note.  

## 2015-05-14 NOTE — Assessment & Plan Note (Addendum)
EGD 10/28/11 - hiatal hernia.  Symptoms as outlined.  Given increased need for TUMS, will restart omeprazole daily.  F/u in two months.  Let me know if symptoms change or do not resolve.

## 2015-05-14 NOTE — Assessment & Plan Note (Signed)
Cholesterol much improved.  LDL 125.  Continue three days per week on the crestor.  Follow lipid panel and liver function tests.

## 2015-05-14 NOTE — Assessment & Plan Note (Signed)
Last mammogram 06/21/14 recommended f/u in one year - Birads III.  Diagnostic mammogram ordered.

## 2015-05-14 NOTE — Progress Notes (Signed)
Patient ID: Jamayia Mcglocklin, female   DOB: July 05, 1945, 69 y.o.   MRN: [id number]   Subjective:    Patient ID: Keymya Quinlan, female    DOB: September 21, 1945, 69 y.o.   MRN: [id number]  HPI  Patient with past history of hypercholesterolemia, hypertension and hypothyroidism.  She comes in today to follow up on these issues.  She reports noticing some acid reflux.  Is not taking omeprazole.  Taking Tums when flares.  Averages three times per week.  Tries to stay active.  No cardiac symptoms with increased activity or exertion.  No sob.  No abdominal pain or cramping.  No vomiting.  Appetite is good.  Bowels stable.  Tolerating the three days per week crestor.     Past Medical History  Diagnosis Date  . Hypertension   . Hypercholesterolemia   . Hypothyroidism    Past Surgical History  Procedure Laterality Date  . Breast biopsy      left, benign   Family History  Problem Relation Age of Onset  . Heart disease Father     died age 86 - myocardial infarction  . Hypercholesterolemia Mother   . Arthritis Mother   . Breast cancer      aunt  . Kidney cancer      aunt  . Colon cancer Neg Hx    Social History   Social History  . Marital Status: Married    Spouse Name: N/A  . Number of Children: 3  . Years of Education: 16   Occupational History  . retired Pharmacist, hospital   .     Social History Main Topics  . Smoking status: Former Research scientist (life sciences)  . Smokeless tobacco: Never Used  . Alcohol Use: 0.0 oz/week    0 Standard drinks or equivalent per week     Comment: Rare drink  . Drug Use: No  . Sexual Activity: Not Asked   Other Topics Concern  . None   Social History Narrative    Outpatient Encounter Prescriptions as of 05/14/2015  Medication Sig  . glucosamine-chondroitin 500-400 MG tablet Take 1 tablet by mouth daily.  Marland Kitchen levothyroxine (SYNTHROID, LEVOTHROID) 50 MCG tablet Take 1 tablet (50 mcg total) by mouth daily.  Marland Kitchen lisinopril-hydrochlorothiazide (PRINZIDE,ZESTORETIC) 10-12.5 MG per  tablet TAKE 1 TABLET BY MOUTH DAILY.  Marland Kitchen loratadine (CLARITIN) 10 MG tablet Take 10 mg by mouth daily.  . Multiple Vitamins-Minerals (CENTRUM SILVER ULTRA WOMENS PO) Take by mouth.  . rosuvastatin (CRESTOR) 5 MG tablet Take 1 tablet (5 mg total) by mouth every 3 (three) days.  Marland Kitchen sertraline (ZOLOFT) 50 MG tablet TAKE 1 TABLET BY MOUTH ONCE A DAY  . [DISCONTINUED] levothyroxine (SYNTHROID, LEVOTHROID) 50 MCG tablet TAKE 1 TABLET BY MOUTH EVERY DAY  . [DISCONTINUED] rosuvastatin (CRESTOR) 5 MG tablet Take 5 mg by mouth every 3 (three) days.  . [DISCONTINUED] rosuvastatin (CRESTOR) 5 MG tablet TAKE 1 TABLET BY MOUTH ONCE A DAY  . omeprazole (PRILOSEC) 20 MG capsule Take 1 capsule (20 mg total) by mouth daily.   No facility-administered encounter medications on file as of 05/14/2015.    Review of Systems  Constitutional: Negative for appetite change and unexpected weight change.  HENT: Negative for sinus pressure.        Minimal allergies.  No fever.   Respiratory: Negative for cough, chest tightness and shortness of breath.   Cardiovascular: Negative for chest pain, palpitations and leg swelling.  Gastrointestinal: Negative for nausea, vomiting and abdominal pain.  Acid reflux as outlined.  Acid - burning.  Bowels stable.   Genitourinary: Negative for dysuria and difficulty urinating.  Musculoskeletal: Negative for back pain and joint swelling.  Skin: Negative for color change and rash.  Neurological: Negative for dizziness, light-headedness and headaches.  Psychiatric/Behavioral: Negative for dysphoric mood and agitation.       Objective:     Blood pressure rechecked by me:  114/70  Physical Exam  Constitutional: She appears well-developed and well-nourished. No distress.  HENT:  Nose: Nose normal.  Mouth/Throat: Oropharynx is clear and moist.  Eyes: Conjunctivae are normal. Right eye exhibits no discharge. Left eye exhibits no discharge.  Neck: Neck supple. No thyromegaly  present.  Cardiovascular: Normal rate and regular rhythm.   Pulmonary/Chest: Breath sounds normal. No respiratory distress. She has no wheezes.  Abdominal: Soft. Bowel sounds are normal. There is no tenderness.  Musculoskeletal: She exhibits no edema or tenderness.  Lymphadenopathy:    She has no cervical adenopathy.  Skin: No rash noted. No erythema.  Psychiatric: She has a normal mood and affect. Her behavior is normal.    BP 90/60 mmHg  Pulse 72  Temp(Src) 98.3 F (36.8 C) (Oral)  Resp 18  Ht 5\' 3"  (1.6 m)  Wt 138 lb (62.596 kg)  BMI 24.45 kg/m2  SpO2 92% Wt Readings from Last 3 Encounters:  05/14/15 138 lb (62.596 kg)  01/10/15 134 lb 8 oz (61.009 kg)  09/25/14 130 lb (58.968 kg)     Lab Results  Component Value Date   WBC 4.2 01/08/2015   HGB 13.8 01/08/2015   HCT 41.1 01/08/2015   PLT 250.0 01/08/2015   GLUCOSE 95 05/08/2015   CHOL 202* 05/08/2015   TRIG 85.0 05/08/2015   HDL 60.30 05/08/2015   LDLDIRECT 165.9 05/11/2013   LDLCALC 125* 05/08/2015   ALT 25 05/08/2015   AST 36 05/08/2015   NA 139 05/08/2015   K 3.8 05/08/2015   CL 104 05/08/2015   CREATININE 0.71 05/08/2015   BUN 16 05/08/2015   CO2 27 05/08/2015   TSH 3.99 05/08/2015    Dg Hip Unilat With Pelvis 2-3 Views Right  01/10/2015  CLINICAL DATA:  Hip pain.  Duration unspecified. EXAM: DG  HIP (WITH OR WITHOUT PELVIS) 2-3V RIGHT COMPARISON:  None. FINDINGS: There is no evidence of hip fracture or dislocation. There is no evidence of arthropathy or other focal bone abnormality. IMPRESSION: Negative. Electronically Signed   By: Staci Righter M.D.   On: 01/10/2015 12:39       Assessment & Plan:   Problem List Items Addressed This Visit    Abnormal mammogram    Last mammogram 06/21/14 recommended f/u in one year - Birads III.  Diagnostic mammogram ordered.        Relevant Orders   MM Digital Diagnostic Bilat   US BREAST LTD UNI RIGHT INC AXILLA   US BREAST LTD UNI LEFT INC AXILLA   Anemia     Colonoscopy 03/15/09 revealed diverticulosis - otherwise normal.  Found to be iron deficient.  Was started on iron.  Not taking.  Last hgb wnl.  Follow cbc.  EGD as outlined.       GERD (gastroesophageal reflux disease)    EGD 10/28/11 - hiatal hernia.  Symptoms as outlined.  Given increased need for TUMS, will restart omeprazole daily.  F/u in two months.  Let me know if symptoms change or do not resolve.        Relevant Medications  omeprazole (PRILOSEC) 20 MG capsule   Hypercholesterolemia    Cholesterol much improved.  LDL 125.  Continue three days per week on the crestor.  Follow lipid panel and liver function tests.        Relevant Medications   rosuvastatin (CRESTOR) 5 MG tablet   Hypertension    Blood pressure under good control.  Continue same medication regimen.  Follow pressures.  Follow metabolic panel.        Relevant Medications   rosuvastatin (CRESTOR) 5 MG tablet   Hypothyroidism    On thyroid replacement.  Follow tsh.  Recent tsh wnl.       Relevant Medications   levothyroxine (SYNTHROID, LEVOTHROID) 50 MCG tablet   Right hip pain    Xray as outlined.  Not an issue today.        Other Visit Diagnoses    Encounter for immunization    -  Primary        Einar Pheasant, MD

## 2015-05-14 NOTE — Assessment & Plan Note (Signed)
Colonoscopy 03/15/09 revealed diverticulosis - otherwise normal.  Found to be iron deficient.  Was started on iron.  Not taking.  Last hgb wnl.  Follow cbc.  EGD as outlined.

## 2015-05-14 NOTE — Patient Instructions (Signed)

## 2015-06-05 ENCOUNTER — Other Ambulatory Visit: Payer: Self-pay | Admitting: Internal Medicine

## 2015-06-24 ENCOUNTER — Other Ambulatory Visit: Payer: Self-pay | Admitting: Internal Medicine

## 2015-06-25 ENCOUNTER — Ambulatory Visit
Admission: RE | Admit: 2015-06-25 | Discharge: 2015-06-25 | Disposition: A | Discharge status: Home / Self Care | Payer: Medicare Other | Source: Ambulatory Visit | Attending: Internal Medicine | Admitting: Internal Medicine

## 2015-06-25 ENCOUNTER — Other Ambulatory Visit: Payer: Self-pay | Admitting: Internal Medicine

## 2015-06-25 DIAGNOSIS — R928 Other abnormal and inconclusive findings on diagnostic imaging of breast: Secondary | ICD-10-CM | POA: Diagnosis not present

## 2015-06-25 DIAGNOSIS — R921 Mammographic calcification found on diagnostic imaging of breast: Secondary | ICD-10-CM | POA: Insufficient documentation

## 2015-06-28 ENCOUNTER — Other Ambulatory Visit: Payer: Self-pay

## 2015-06-28 MED ORDER — SERTRALINE HCL 50 MG PO TABS: 50.0000 mg | ORAL_TABLET | Freq: Every day | ORAL | Status: DC

## 2015-07-17 ENCOUNTER — Ambulatory Visit (INDEPENDENT_AMBULATORY_CARE_PROVIDER_SITE_OTHER): Payer: Medicare Other | Admitting: Internal Medicine

## 2015-07-17 ENCOUNTER — Encounter: Payer: Self-pay | Admitting: Internal Medicine

## 2015-07-17 VITALS — BP 110/70 | HR 84 | Temp 97.8°F | Resp 18 | Ht 63.0 in | Wt 140.5 lb

## 2015-07-17 DIAGNOSIS — I10 Essential (primary) hypertension: Secondary | ICD-10-CM | POA: Diagnosis not present

## 2015-07-17 DIAGNOSIS — R928 Other abnormal and inconclusive findings on diagnostic imaging of breast: Secondary | ICD-10-CM

## 2015-07-17 DIAGNOSIS — K219 Gastro-esophageal reflux disease without esophagitis: Secondary | ICD-10-CM | POA: Diagnosis not present

## 2015-07-17 DIAGNOSIS — D649 Anemia, unspecified: Secondary | ICD-10-CM

## 2015-07-17 DIAGNOSIS — E7800 Pure hypercholesterolemia, unspecified: Secondary | ICD-10-CM | POA: Diagnosis not present

## 2015-07-17 DIAGNOSIS — E039 Hypothyroidism, unspecified: Secondary | ICD-10-CM | POA: Diagnosis not present

## 2015-07-17 NOTE — Progress Notes (Signed)
Pre-visit discussion using our clinic review tool. No additional management support is needed unless otherwise documented below in the visit note.  

## 2015-07-17 NOTE — Progress Notes (Signed)
Patient ID: Savannah Cox, female   DOB: 1945-10-23, 70 y.o.   MRN: [id number]   Subjective:    Patient ID: Savannah Cox, female    DOB: Feb 06, 1946, 70 y.o.   MRN: [id number]  HPI  Patient with past history of hypercholesterolemia, hypertension and hypothyroidism.  She comes in today to follow up on these issues.  She is not taking omeprazole regularly now.  Did take for approximately 10-14 days after last visit.  Symptoms resolved while on the medication.  She stopped taking regularly.  Has symptoms now 1-2x/week.  Discussed taking omeprazole regularly.  No abdominal pain or cramping.  Bowels stable.  Breathing stable.  Tries to stay active.     Past Medical History  Diagnosis Date  . Hypertension   . Hypercholesterolemia   . Hypothyroidism    Past Surgical History  Procedure Laterality Date  . Breast biopsy      left, benign   Family History  Problem Relation Age of Onset  . Heart disease Father     died age 33 - myocardial infarction  . Hypercholesterolemia Mother   . Arthritis Mother   . Kidney cancer      aunt  . Colon cancer Neg Hx   . Breast cancer Maternal Aunt    Social History   Social History  . Marital Status: Married    Spouse Name: N/A  . Number of Children: 3  . Years of Education: 16   Occupational History  . retired Pharmacist, hospital   .     Social History Main Topics  . Smoking status: Former Research scientist (life sciences)  . Smokeless tobacco: Never Used  . Alcohol Use: 0.0 oz/week    0 Standard drinks or equivalent per week     Comment: Rare drink  . Drug Use: No  . Sexual Activity: Not Asked   Other Topics Concern  . None   Social History Narrative    Outpatient Encounter Prescriptions as of 07/17/2015  Medication Sig  . glucosamine-chondroitin 500-400 MG tablet Take 1 tablet by mouth daily.  Marland Kitchen levothyroxine (SYNTHROID, LEVOTHROID) 50 MCG tablet Take 1 tablet (50 mcg total) by mouth daily.  Marland Kitchen lisinopril-hydrochlorothiazide (PRINZIDE,ZESTORETIC) 10-12.5 MG per  tablet TAKE 1 TABLET BY MOUTH DAILY.  Marland Kitchen loratadine (CLARITIN) 10 MG tablet Take 10 mg by mouth daily.  . Multiple Vitamins-Minerals (CENTRUM SILVER ULTRA WOMENS PO) Take by mouth.  Marland Kitchen omeprazole (PRILOSEC) 20 MG capsule Take 1 capsule (20 mg total) by mouth daily.  . rosuvastatin (CRESTOR) 5 MG tablet Take 1 tablet (5 mg total) by mouth every 3 (three) days.  Marland Kitchen sertraline (ZOLOFT) 50 MG tablet Take 1 tablet (50 mg total) by mouth daily.  . [DISCONTINUED] lisinopril-hydrochlorothiazide (PRINZIDE,ZESTORETIC) 10-12.5 MG tablet TAKE 1 TABLET BY MOUTH DAILY.   No facility-administered encounter medications on file as of 07/17/2015.    Review of Systems  Constitutional: Negative for appetite change and unexpected weight change.  HENT: Negative for congestion and sinus pressure.   Respiratory: Negative for cough, chest tightness and shortness of breath.   Cardiovascular: Negative for chest pain, palpitations and leg swelling.  Gastrointestinal: Negative for nausea, vomiting, abdominal pain and diarrhea.       Acid reflux as outlined.    Genitourinary: Negative for dysuria and difficulty urinating.  Musculoskeletal: Negative for back pain and joint swelling.  Skin: Negative for color change and rash.  Neurological: Negative for dizziness, light-headedness and headaches.  Psychiatric/Behavioral: Negative for dysphoric mood and agitation.  Objective:    Physical Exam  Constitutional: She appears well-developed and well-nourished. No distress.  HENT:  Nose: Nose normal.  Mouth/Throat: Oropharynx is clear and moist.  Eyes: Conjunctivae are normal. Right eye exhibits no discharge. Left eye exhibits no discharge.  Neck: Neck supple. No thyromegaly present.  Cardiovascular: Normal rate and regular rhythm.   Pulmonary/Chest: Breath sounds normal. No respiratory distress. She has no wheezes.  Abdominal: Soft. Bowel sounds are normal. There is no tenderness.  Musculoskeletal: She exhibits no  edema or tenderness.  Lymphadenopathy:    She has no cervical adenopathy.  Skin: No rash noted. No erythema.  Psychiatric: She has a normal mood and affect. Her behavior is normal.    BP 110/70 mmHg  Pulse 84  Temp(Src) 97.8 F (36.6 C) (Oral)  Resp 18  Ht 5\' 3"  (1.6 m)  Wt 140 lb 8 oz (63.73 kg)  BMI 24.89 kg/m2  SpO2 94% Wt Readings from Last 3 Encounters:  07/17/15 140 lb 8 oz (63.73 kg)  05/14/15 138 lb (62.596 kg)  01/10/15 134 lb 8 oz (61.009 kg)     Lab Results  Component Value Date   WBC 4.2 01/08/2015   HGB 13.8 01/08/2015   HCT 41.1 01/08/2015   PLT 250.0 01/08/2015   GLUCOSE 95 05/08/2015   CHOL 202* 05/08/2015   TRIG 85.0 05/08/2015   HDL 60.30 05/08/2015   LDLDIRECT 165.9 05/11/2013   LDLCALC 125* 05/08/2015   ALT 25 05/08/2015   AST 36 05/08/2015   NA 139 05/08/2015   K 3.8 05/08/2015   CL 104 05/08/2015   CREATININE 0.71 05/08/2015   BUN 16 05/08/2015   CO2 27 05/08/2015   TSH 3.99 05/08/2015    Mm Diag Breast Tomo Bilateral  06/25/2015  CLINICAL DATA:  Six-month follow-up left breast calcifications EXAM: DIGITAL DIAGNOSTIC BILATERAL MAMMOGRAM WITH 3D TOMOSYNTHESIS AND CAD COMPARISON:  Previous exam(s). ACR Breast Density Category b: There are scattered areas of fibroglandular density. FINDINGS: Cc and MLO views of bilateral breasts, spot magnification left CC and lateral views are submitted. Previously noted calcifications in the retroareolar left breast are unchanged. No suspicious abnormalities identified bilaterally. Mammographic images were processed with CAD. IMPRESSION: Benign findings. RECOMMENDATION: Routine screening mammogram in 1 year. I have discussed the findings and recommendations with the patient. Results were also provided in writing at the conclusion of the visit. If applicable, a reminder letter will be sent to the patient regarding the next appointment. BI-RADS CATEGORY  2: Benign. Electronically Signed   By: Abelardo Diesel M.D.   On:  06/25/2015 13:36       Assessment & Plan:   Problem List Items Addressed This Visit    Abnormal mammogram    mammogram 06/25/15 - Birads II.  Recommended f/u mammogram in one year.        Anemia    Colonoscopy 03/15/09 revealed diverticulosis - otherwise normal.  Found to be iron deficient.  Follow cbc.        GERD (gastroesophageal reflux disease)    Had EGD 10/28/11 - hiatal hernia.  Symptoms as outlined.  Will restart omeprazole daily.  Follow.  Notify me if persistent symptoms despite medications.        Hypercholesterolemia    On crestor.  Low cholesterol diet and exercise.  Follow lipid panel and liver function tests.        Relevant Orders   Lipid panel   Hepatic function panel   Hypertension - Primary    Blood  pressure under good control.  Continue same medication regimen.  Follow pressures.  Follow metabolic panel.        Relevant Orders   Basic metabolic panel   Hypothyroidism    On thyroid replacement.  Follow tsh.            Einar Pheasant, MD

## 2015-07-22 ENCOUNTER — Encounter: Payer: Self-pay | Admitting: Internal Medicine

## 2015-07-22 NOTE — Assessment & Plan Note (Signed)
Colonoscopy 03/15/09 revealed diverticulosis - otherwise normal.  Found to be iron deficient.  Follow cbc.

## 2015-07-22 NOTE — Assessment & Plan Note (Signed)
Blood pressure under good control.  Continue same medication regimen.  Follow pressures.  Follow metabolic panel.   

## 2015-07-22 NOTE — Assessment & Plan Note (Signed)
On thyroid replacement.  Follow tsh.  

## 2015-07-22 NOTE — Assessment & Plan Note (Signed)
On crestor.  Low cholesterol diet and exercise.  Follow lipid panel and liver function tests.   

## 2015-07-22 NOTE — Assessment & Plan Note (Signed)
Had EGD 10/28/11 - hiatal hernia.  Symptoms as outlined.  Will restart omeprazole daily.  Follow.  Notify me if persistent symptoms despite medications.

## 2015-07-22 NOTE — Assessment & Plan Note (Signed)
mammogram 06/25/15 - Birads II.  Recommended f/u mammogram in one year.

## 2015-09-24 ENCOUNTER — Other Ambulatory Visit: Payer: Self-pay | Admitting: Internal Medicine

## 2015-10-15 ENCOUNTER — Other Ambulatory Visit (INDEPENDENT_AMBULATORY_CARE_PROVIDER_SITE_OTHER): Payer: Medicare Other

## 2015-10-15 DIAGNOSIS — I10 Essential (primary) hypertension: Secondary | ICD-10-CM

## 2015-10-15 DIAGNOSIS — E7800 Pure hypercholesterolemia, unspecified: Secondary | ICD-10-CM | POA: Diagnosis not present

## 2015-10-15 LAB — BASIC METABOLIC PANEL
BUN: 16 mg/dL (ref 6–23)
CALCIUM: 9.3 mg/dL (ref 8.4–10.5)
CHLORIDE: 106 meq/L (ref 96–112)
CO2: 28 mEq/L (ref 19–32)
CREATININE: 0.67 mg/dL (ref 0.40–1.20)
GFR: 93.07 mL/min (ref >60.00)
Glucose, Bld: 96 mg/dL (ref 70–99)
POTASSIUM: 3.9 meq/L (ref 3.5–5.1)
SODIUM: 140 meq/L (ref 135–145)

## 2015-10-15 LAB — HEPATIC FUNCTION PANEL
ALBUMIN: 4 g/dL (ref 3.5–5.2)
ALT: 24 U/L (ref 0–35)
AST: 32 U/L (ref 0–37)
Alkaline Phosphatase: 53 U/L (ref 39–117)
BILIRUBIN TOTAL: 0.5 mg/dL (ref 0.2–1.2)
Bilirubin, Direct: 0.1 mg/dL (ref 0.0–0.3)
Total Protein: 6.7 g/dL (ref 6.0–8.3)

## 2015-10-15 LAB — LIPID PANEL
CHOL/HDL RATIO: 3
Cholesterol: 215 mg/dL — ABNORMAL HIGH (ref 0–200)
HDL: 70.7 mg/dL (ref >39.00)
LDL Cholesterol: 124 mg/dL — ABNORMAL HIGH (ref 0–99)
NONHDL: 144.15
Triglycerides: 100 mg/dL (ref 0.0–149.0)
VLDL: 20 mg/dL (ref 0.0–40.0)

## 2015-10-16 ENCOUNTER — Encounter: Payer: Self-pay | Admitting: Internal Medicine

## 2015-10-17 ENCOUNTER — Ambulatory Visit: Payer: Medicare Other | Admitting: Internal Medicine

## 2015-10-19 ENCOUNTER — Ambulatory Visit (INDEPENDENT_AMBULATORY_CARE_PROVIDER_SITE_OTHER): Payer: Medicare Other | Admitting: Internal Medicine

## 2015-10-19 ENCOUNTER — Encounter: Payer: Self-pay | Admitting: Internal Medicine

## 2015-10-19 VITALS — BP 100/60 | HR 75 | Temp 98.4°F | Resp 18 | Ht 63.0 in | Wt 141.5 lb

## 2015-10-19 DIAGNOSIS — R198 Other specified symptoms and signs involving the digestive system and abdomen: Secondary | ICD-10-CM

## 2015-10-19 DIAGNOSIS — R928 Other abnormal and inconclusive findings on diagnostic imaging of breast: Secondary | ICD-10-CM

## 2015-10-19 DIAGNOSIS — E039 Hypothyroidism, unspecified: Secondary | ICD-10-CM | POA: Diagnosis not present

## 2015-10-19 DIAGNOSIS — I10 Essential (primary) hypertension: Secondary | ICD-10-CM

## 2015-10-19 DIAGNOSIS — K219 Gastro-esophageal reflux disease without esophagitis: Secondary | ICD-10-CM

## 2015-10-19 DIAGNOSIS — E7800 Pure hypercholesterolemia, unspecified: Secondary | ICD-10-CM

## 2015-10-19 DIAGNOSIS — R194 Change in bowel habit: Secondary | ICD-10-CM

## 2015-10-19 MED ORDER — LISINOPRIL-HYDROCHLOROTHIAZIDE 10-12.5 MG PO TABS: 1.0000 | ORAL_TABLET | Freq: Every day | ORAL | Status: DC

## 2015-10-19 NOTE — Assessment & Plan Note (Signed)
Mammogram 06/25/15 - birads II.  Recommended f/u mammogram in one year.

## 2015-10-19 NOTE — Progress Notes (Signed)
Patient ID: Savannah Cox, female   DOB: 08-20-1945, 70 y.o.   MRN: [id number]   Subjective:    Patient ID: Savannah Cox, female    DOB: [birth year], 70 y.o.   MRN: [id number]  HPI  Patient here for a scheduled follow up.  She states she is doing well. Stays active.  Reports no cardiac symptoms with increased activity or exertion.  No sob.  No acid reflux.  No abdominal pain or cramping.  She does report she has noticed some stool changes.  Is concerned because her stool is "thinner".  Some change in bowel function.  No blood.  Request referral to GI for further evaluation.  Discussed labs.    Past Medical History  Diagnosis Date  . Hypertension   . Hypercholesterolemia   . Hypothyroidism    Past Surgical History  Procedure Laterality Date  . Breast biopsy      left, benign   Family History  Problem Relation Age of Onset  . Heart disease Father     died age 69 - myocardial infarction  . Hypercholesterolemia Mother   . Arthritis Mother   . Kidney cancer      aunt  . Colon cancer Neg Hx   . Breast cancer Maternal Aunt    Social History   Social History  . Marital Status: Married    Spouse Name: N/A  . Number of Children: 3  . Years of Education: 16   Occupational History  . retired Pharmacist, hospital   .     Social History Main Topics  . Smoking status: Former Research scientist (life sciences)  . Smokeless tobacco: Never Used  . Alcohol Use: 0.0 oz/week    0 Standard drinks or equivalent per week     Comment: Rare drink  . Drug Use: No  . Sexual Activity: Not Asked   Other Topics Concern  . None   Social History Narrative    Outpatient Encounter Prescriptions as of 10/19/2015  Medication Sig  . levothyroxine (SYNTHROID, LEVOTHROID) 50 MCG tablet Take 1 tablet (50 mcg total) by mouth daily.  Marland Kitchen lisinopril-hydrochlorothiazide (PRINZIDE,ZESTORETIC) 10-12.5 MG tablet Take 1 tablet by mouth daily.  Marland Kitchen loratadine (CLARITIN) 10 MG tablet Take 10 mg by mouth daily.  . Multiple Vitamins-Minerals  (CENTRUM SILVER ULTRA WOMENS PO) Take by mouth.  Marland Kitchen omeprazole (PRILOSEC) 20 MG capsule Take 1 capsule (20 mg total) by mouth daily.  . rosuvastatin (CRESTOR) 5 MG tablet TAKE 1 TABLET BY MOUTH ONCE A DAY  . sertraline (ZOLOFT) 50 MG tablet Take 1 tablet (50 mg total) by mouth daily.  . [DISCONTINUED] lisinopril-hydrochlorothiazide (PRINZIDE,ZESTORETIC) 10-12.5 MG per tablet TAKE 1 TABLET BY MOUTH DAILY.  Marland Kitchen glucosamine-chondroitin 500-400 MG tablet Take 1 tablet by mouth daily. Reported on 10/19/2015   No facility-administered encounter medications on file as of 10/19/2015.    Review of Systems  Constitutional: Negative for appetite change and unexpected weight change.  HENT: Negative for congestion and sinus pressure.   Respiratory: Negative for cough, chest tightness and shortness of breath.   Cardiovascular: Negative for chest pain, palpitations and leg swelling.  Gastrointestinal: Negative for nausea, vomiting, abdominal pain and diarrhea.       Stool change as outlined.   Genitourinary: Negative for dysuria and difficulty urinating.  Musculoskeletal: Negative for back pain and joint swelling.  Skin: Negative for color change and rash.  Neurological: Negative for dizziness, light-headedness and headaches.  Psychiatric/Behavioral: Negative for dysphoric mood and agitation.       Objective:  Blood pressure rechecked by me:  120/68  Physical Exam  Constitutional: She appears well-developed and well-nourished. No distress.  HENT:  Nose: Nose normal.  Mouth/Throat: Oropharynx is clear and moist.  Neck: Neck supple. No thyromegaly present.  Cardiovascular: Normal rate and regular rhythm.   Pulmonary/Chest: Breath sounds normal. No respiratory distress. She has no wheezes.  Abdominal: Soft. Bowel sounds are normal. There is no tenderness.  Musculoskeletal: She exhibits no edema or tenderness.  Lymphadenopathy:    She has no cervical adenopathy.  Skin: No rash noted. No erythema.    Psychiatric: She has a normal mood and affect. Her behavior is normal.    BP 100/60 mmHg  Pulse 75  Temp(Src) 98.4 F (36.9 C) (Oral)  Resp 18  Ht 5\' 3"  (1.6 m)  Wt 141 lb 8 oz (64.184 kg)  BMI 25.07 kg/m2  SpO2 95% Wt Readings from Last 3 Encounters:  10/19/15 141 lb 8 oz (64.184 kg)  07/17/15 140 lb 8 oz (63.73 kg)  05/14/15 138 lb (62.596 kg)     Lab Results  Component Value Date   WBC 4.2 01/08/2015   HGB 13.8 01/08/2015   HCT 41.1 01/08/2015   PLT 250.0 01/08/2015   GLUCOSE 96 10/15/2015   CHOL 215* 10/15/2015   TRIG 100.0 10/15/2015   HDL 70.70 10/15/2015   LDLDIRECT 165.9 05/11/2013   LDLCALC 124* 10/15/2015   ALT 24 10/15/2015   AST 32 10/15/2015   NA 140 10/15/2015   K 3.9 10/15/2015   CL 106 10/15/2015   CREATININE 0.67 10/15/2015   BUN 16 10/15/2015   CO2 28 10/15/2015   TSH 3.99 05/08/2015    Mm Diag Breast Tomo Bilateral  06/25/2015  CLINICAL DATA:  Six-month follow-up left breast calcifications EXAM: DIGITAL DIAGNOSTIC BILATERAL MAMMOGRAM WITH 3D TOMOSYNTHESIS AND CAD COMPARISON:  Previous exam(s). ACR Breast Density Category b: There are scattered areas of fibroglandular density. FINDINGS: Cc and MLO views of bilateral breasts, spot magnification left CC and lateral views are submitted. Previously noted calcifications in the retroareolar left breast are unchanged. No suspicious abnormalities identified bilaterally. Mammographic images were processed with CAD. IMPRESSION: Benign findings. RECOMMENDATION: Routine screening mammogram in 1 year. I have discussed the findings and recommendations with the patient. Results were also provided in writing at the conclusion of the visit. If applicable, a reminder letter will be sent to the patient regarding the next appointment. BI-RADS CATEGORY  2: Benign. Electronically Signed   By: Abelardo Diesel M.D.   On: 06/25/2015 13:36       Assessment & Plan:   Problem List Items Addressed This Visit    Abnormal  mammogram - Primary    Mammogram 06/25/15 - birads II.  Recommended f/u mammogram in one year.       Change in bowel movement    With change in bowel movement as outlined, pt concerned.  Request referral back to GI for evaluation and question of need for f/u colonoscopy.        GERD (gastroesophageal reflux disease)    Upper symptoms controlled on omeprazole.  Follow.       Hypercholesterolemia    On crestor and tolerating.  LDL 124.  Will try increasing crestor to 4 days per week.  Follow lipid panel and liver function tests.       Relevant Medications   lisinopril-hydrochlorothiazide (PRINZIDE,ZESTORETIC) 10-12.5 MG tablet   Other Relevant Orders   Lipid panel   Hepatic function panel   Hypertension  Blood pressure under good control.  Continue same medication regimen.  Follow pressures.  Follow metabolic panel.        Relevant Medications   lisinopril-hydrochlorothiazide (PRINZIDE,ZESTORETIC) 10-12.5 MG tablet   Other Relevant Orders   CBC with Differential/Platelet   Basic metabolic panel   Hypothyroidism    On thyroid replacement.  Follow tsh.       Relevant Orders   TSH       Einar Pheasant, MD

## 2015-10-19 NOTE — Progress Notes (Signed)
Pre-visit discussion using our clinic review tool. No additional management support is needed unless otherwise documented below in the visit note.  

## 2015-10-21 ENCOUNTER — Encounter: Payer: Self-pay | Admitting: Internal Medicine

## 2015-10-21 DIAGNOSIS — R198 Other specified symptoms and signs involving the digestive system and abdomen: Secondary | ICD-10-CM | POA: Insufficient documentation

## 2015-10-21 NOTE — Assessment & Plan Note (Signed)
On crestor and tolerating.  LDL 124.  Will try increasing crestor to 4 days per week.  Follow lipid panel and liver function tests.

## 2015-10-21 NOTE — Addendum Note (Signed)
Addended by: Alisa Graff on: 10/21/2015 07:45 AM   Modules accepted: Orders

## 2015-10-21 NOTE — Assessment & Plan Note (Signed)
Upper symptoms controlled on omeprazole.  Follow.   

## 2015-10-21 NOTE — Assessment & Plan Note (Signed)
On thyroid replacement.  Follow tsh.  

## 2015-10-21 NOTE — Assessment & Plan Note (Signed)
With change in bowel movement as outlined, pt concerned.  Request referral back to GI for evaluation and question of need for f/u colonoscopy.

## 2015-10-21 NOTE — Assessment & Plan Note (Signed)
Blood pressure under good control.  Continue same medication regimen.  Follow pressures.  Follow metabolic panel.   

## 2015-11-15 ENCOUNTER — Other Ambulatory Visit: Payer: Self-pay | Admitting: Internal Medicine

## 2015-11-15 NOTE — Telephone Encounter (Signed)
Rx refill sent to pharmacy. 

## 2016-01-11 IMAGING — CR DG HIP (WITH OR WITHOUT PELVIS) 2-3V*R*
3 series · 3 of 3 positions shown · non-contrast
Comparison: None.

CLINICAL DATA: Hip pain.  Duration unspecified.

EXAM:
DG  HIP (WITH OR WITHOUT PELVIS) 2-3V RIGHT

[view not recorded (1 of 3)]
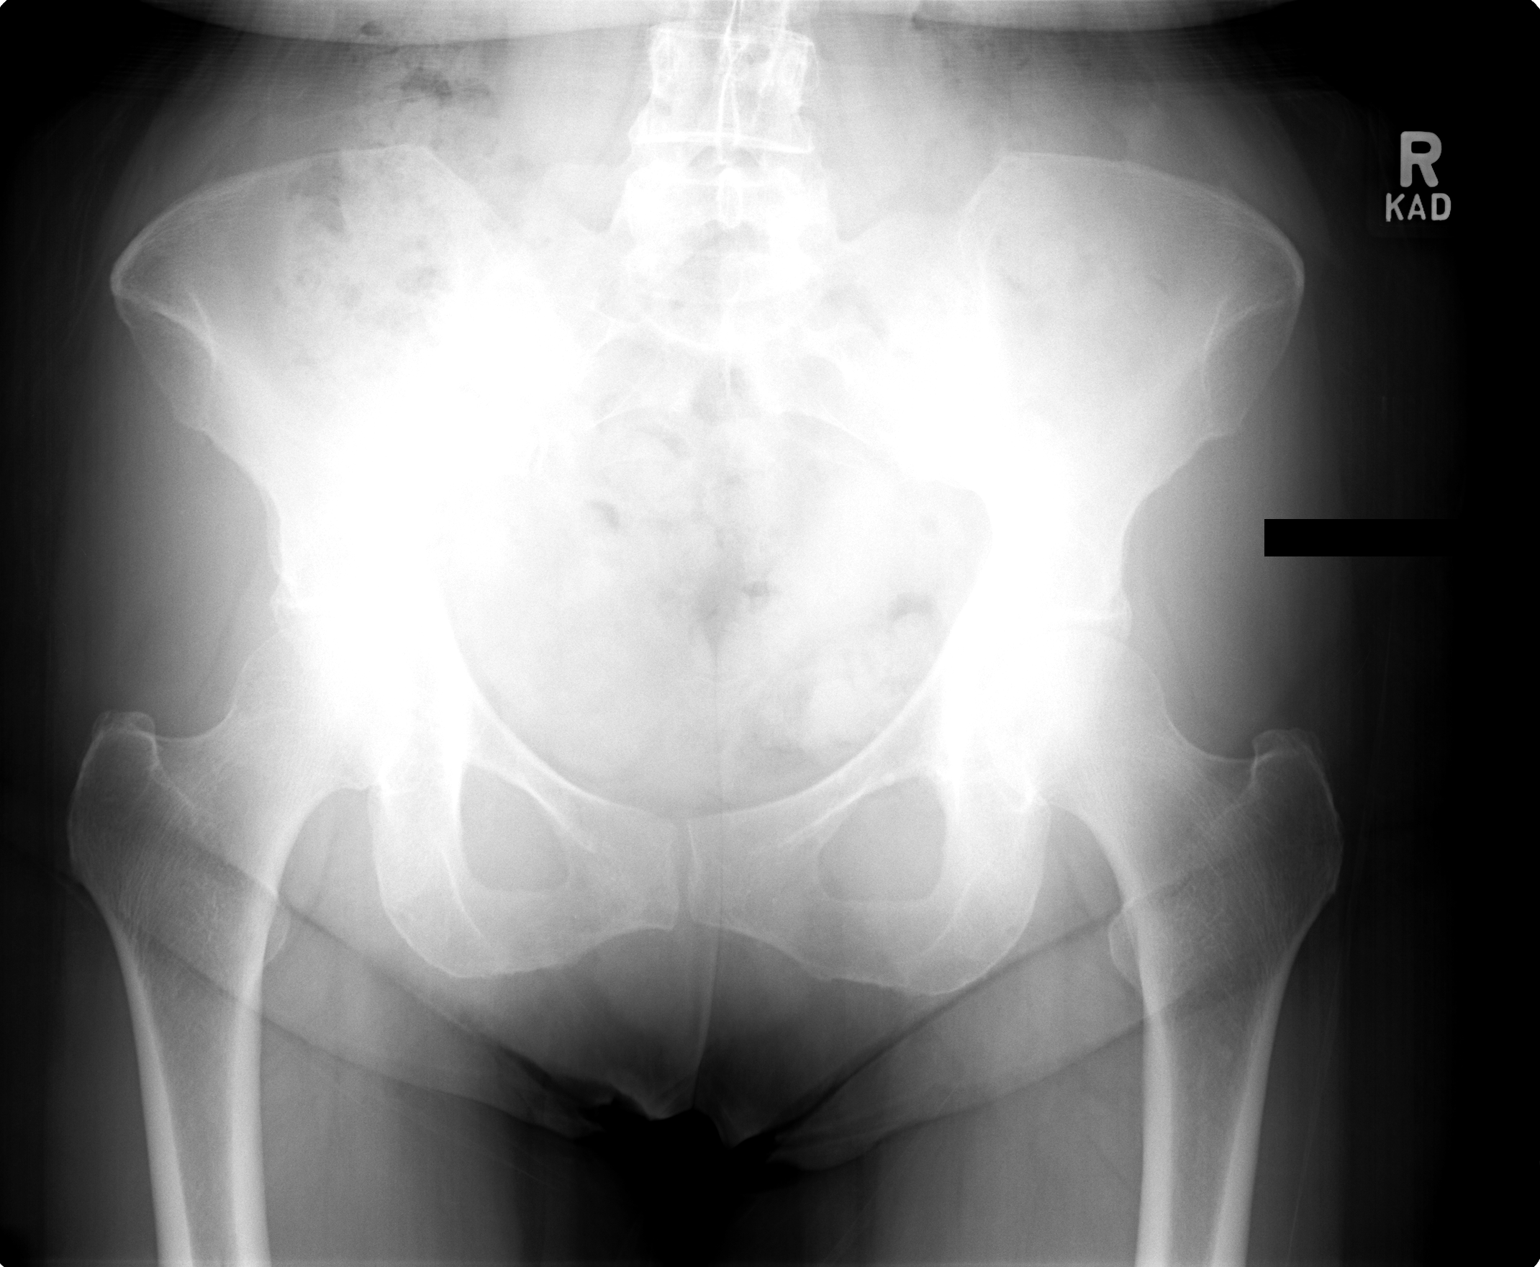

[view not recorded (2 of 3)]
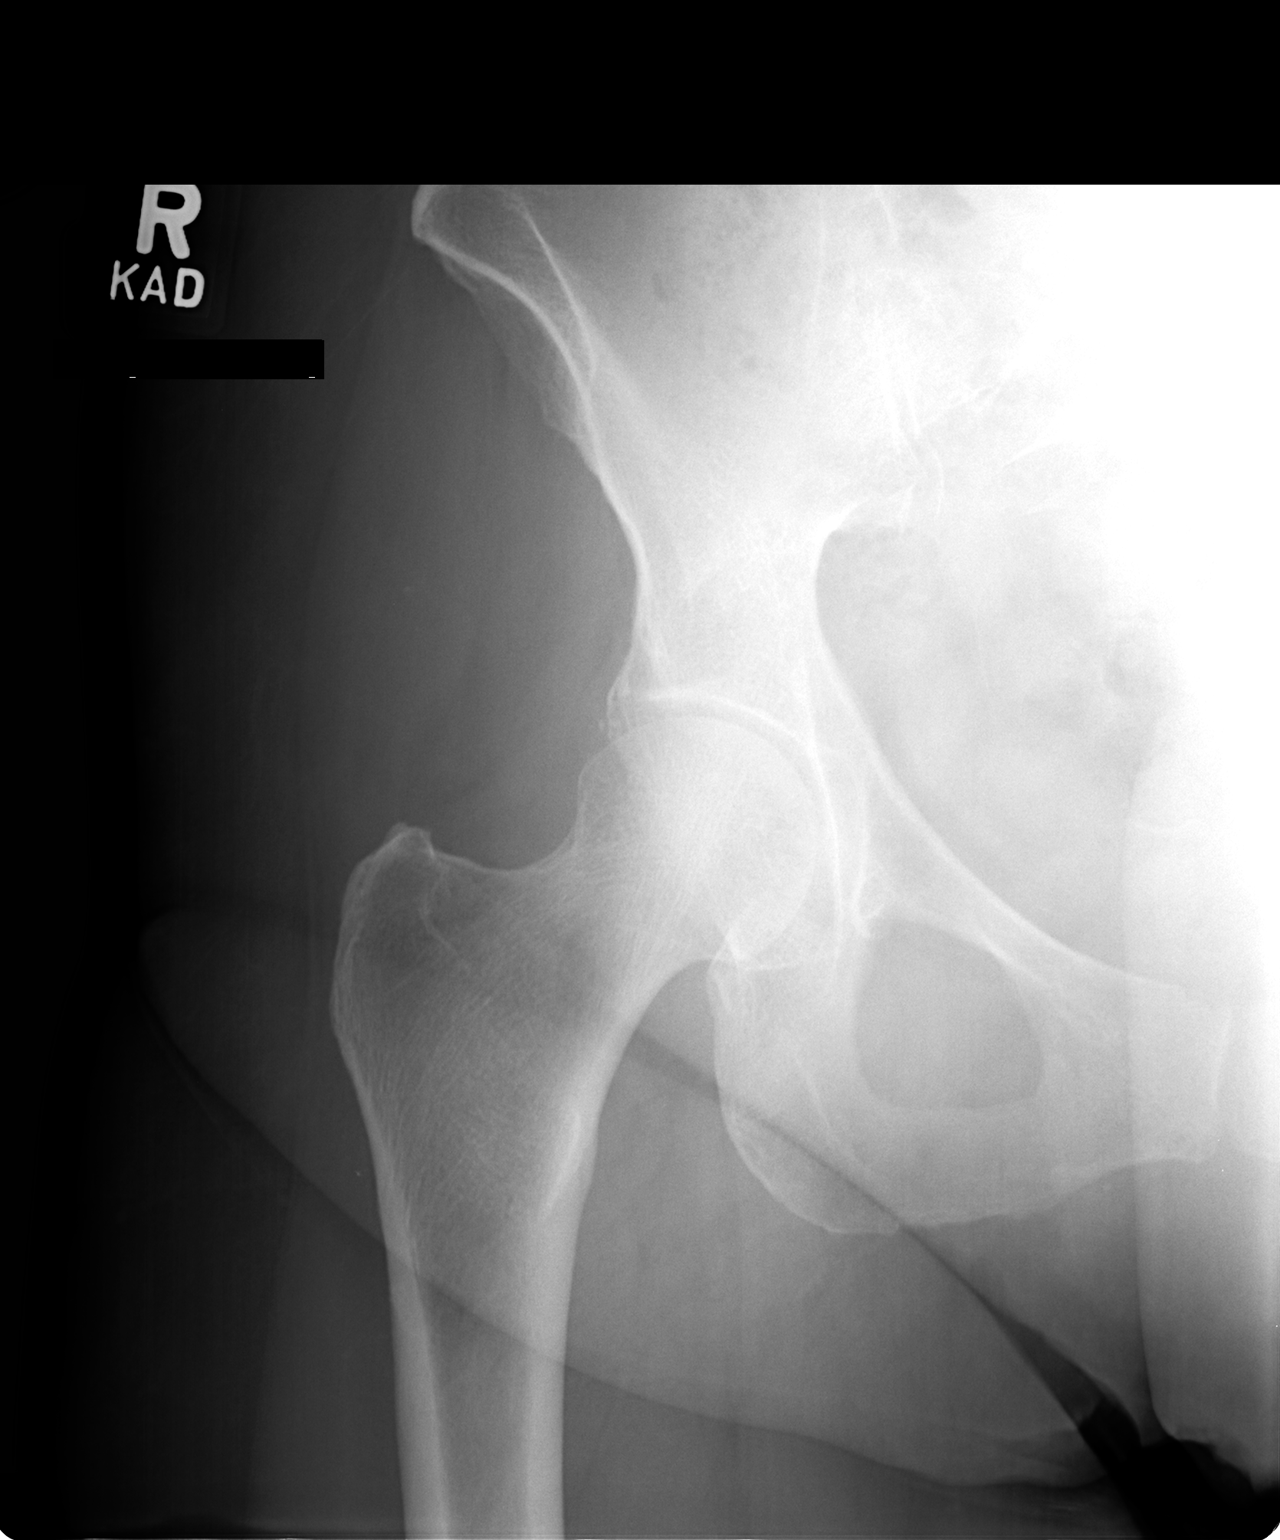

[view not recorded (3 of 3)]
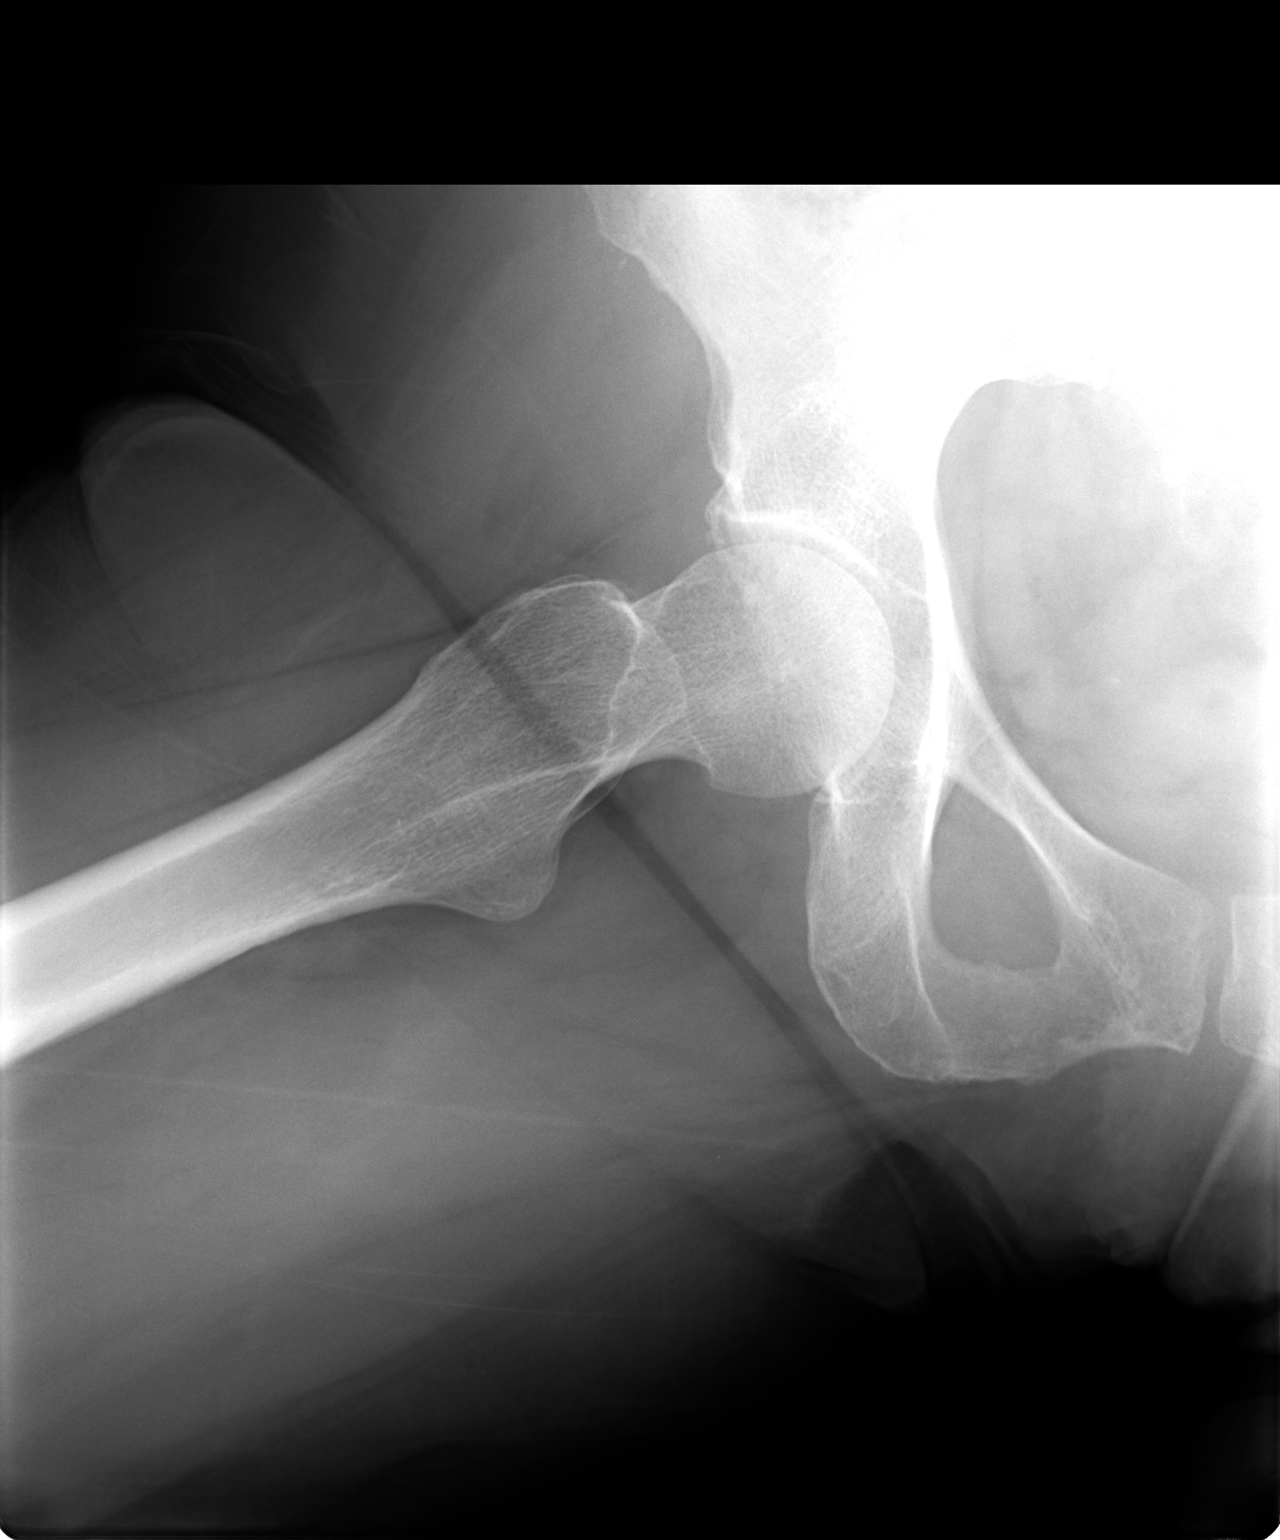

[3 of 3 positions shown; findings below may reference images not displayed]

FINDINGS: There is no evidence of hip fracture or dislocation. There is no
evidence of arthropathy or other focal bone abnormality.
IMPRESSION: Negative.

## 2016-01-24 ENCOUNTER — Encounter: Payer: Self-pay | Admitting: *Deleted

## 2016-01-28 ENCOUNTER — Ambulatory Visit
Admission: RE | Admit: 2016-01-28 | Discharge: 2016-01-28 | Disposition: A | Discharge status: Home / Self Care | Payer: Medicare Other | Source: Ambulatory Visit | Attending: Gastroenterology | Admitting: Gastroenterology

## 2016-01-28 ENCOUNTER — Encounter: Payer: Self-pay | Admitting: Anesthesiology

## 2016-01-28 ENCOUNTER — Ambulatory Visit: Payer: Medicare Other | Admitting: Anesthesiology

## 2016-01-28 ENCOUNTER — Encounter
Admission: RE | Disposition: A | Discharge status: Home / Self Care | Payer: Self-pay | Source: Ambulatory Visit | Attending: Gastroenterology

## 2016-01-28 DIAGNOSIS — E039 Hypothyroidism, unspecified: Secondary | ICD-10-CM | POA: Insufficient documentation

## 2016-01-28 DIAGNOSIS — Z880 Allergy status to penicillin: Secondary | ICD-10-CM | POA: Diagnosis not present

## 2016-01-28 DIAGNOSIS — Z87891 Personal history of nicotine dependence: Secondary | ICD-10-CM | POA: Insufficient documentation

## 2016-01-28 DIAGNOSIS — N189 Chronic kidney disease, unspecified: Secondary | ICD-10-CM | POA: Insufficient documentation

## 2016-01-28 DIAGNOSIS — K621 Rectal polyp: Secondary | ICD-10-CM | POA: Diagnosis not present

## 2016-01-28 DIAGNOSIS — I129 Hypertensive chronic kidney disease with stage 1 through stage 4 chronic kidney disease, or unspecified chronic kidney disease: Secondary | ICD-10-CM | POA: Diagnosis not present

## 2016-01-28 DIAGNOSIS — R194 Change in bowel habit: Secondary | ICD-10-CM | POA: Diagnosis present

## 2016-01-28 DIAGNOSIS — D122 Benign neoplasm of ascending colon: Secondary | ICD-10-CM | POA: Insufficient documentation

## 2016-01-28 DIAGNOSIS — Z79899 Other long term (current) drug therapy: Secondary | ICD-10-CM | POA: Diagnosis not present

## 2016-01-28 DIAGNOSIS — K5730 Diverticulosis of large intestine without perforation or abscess without bleeding: Secondary | ICD-10-CM | POA: Insufficient documentation

## 2016-01-28 DIAGNOSIS — E7800 Pure hypercholesterolemia, unspecified: Secondary | ICD-10-CM | POA: Insufficient documentation

## 2016-01-28 DIAGNOSIS — M810 Age-related osteoporosis without current pathological fracture: Secondary | ICD-10-CM | POA: Insufficient documentation

## 2016-01-28 DIAGNOSIS — K219 Gastro-esophageal reflux disease without esophagitis: Secondary | ICD-10-CM | POA: Diagnosis not present

## 2016-01-28 DIAGNOSIS — Z862 Personal history of diseases of the blood and blood-forming organs and certain disorders involving the immune mechanism: Secondary | ICD-10-CM | POA: Insufficient documentation

## 2016-01-28 HISTORY — DX: Anemia, unspecified: D64.9

## 2016-01-28 HISTORY — PX: COLONOSCOPY WITH PROPOFOL: SHX5780

## 2016-01-28 HISTORY — DX: Zoster without complications: B02.9

## 2016-01-28 HISTORY — DX: Allergy, unspecified, initial encounter: T78.40XA

## 2016-01-28 HISTORY — DX: Age-related osteoporosis without current pathological fracture: M81.0

## 2016-01-28 SURGERY — COLONOSCOPY WITH PROPOFOL
Anesthesia: General

## 2016-01-28 MED ORDER — SODIUM CHLORIDE 0.9 % IV SOLN
INTRAVENOUS | Status: DC
  Administered 2016-01-28: 09:00:00 via INTRAVENOUS

## 2016-01-28 MED ORDER — SODIUM CHLORIDE 0.9 % IV SOLN
INTRAVENOUS | Status: DC
  Administered 2016-01-28: 1000 mL via INTRAVENOUS

## 2016-01-28 MED ORDER — PROPOFOL 500 MG/50ML IV EMUL
INTRAVENOUS | Status: DC | PRN
  Administered 2016-01-28: 140 ug/kg/min via INTRAVENOUS

## 2016-01-28 MED ORDER — MIDAZOLAM HCL 2 MG/2ML IJ SOLN
INTRAMUSCULAR | Status: DC | PRN
  Administered 2016-01-28: 1 mg via INTRAVENOUS

## 2016-01-28 MED ORDER — PROPOFOL 10 MG/ML IV BOLUS
INTRAVENOUS | Status: DC | PRN
  Administered 2016-01-28: 30 mg via INTRAVENOUS

## 2016-01-28 MED ORDER — FENTANYL CITRATE (PF) 100 MCG/2ML IJ SOLN
INTRAMUSCULAR | Status: DC | PRN
  Administered 2016-01-28: 50 ug via INTRAVENOUS

## 2016-01-28 MED ORDER — PHENYLEPHRINE HCL 10 MG/ML IJ SOLN
INTRAMUSCULAR | Status: DC | PRN
  Administered 2016-01-28 (×2): 50 ug via INTRAVENOUS
  Administered 2016-01-28 (×4): 100 ug via INTRAVENOUS

## 2016-01-28 NOTE — Op Note (Signed)
[facility] Patient Name: Savannah Cox Procedure Date: 01/28/2016 9:14 AM MRN: [id number] Account #: [id number] Date of Birth: 04-17-1946 Admit Type: Outpatient Age: 70 Room: [facility] ENDO ROOM 3 Gender: Female Note Status: Finalized Procedure:            Colonoscopy Indications:          Change in bowel habits Providers:            Lollie Sails, MD Referring MD:         Einar Pheasant, MD (Referring MD) Medicines:            Monitored Anesthesia Care Complications:        No immediate complications. Procedure:            Pre-Anesthesia Assessment:                       - ASA Grade Assessment: II - A patient with mild                        systemic disease.                       After obtaining informed consent, the colonoscope was                        passed under direct vision. Throughout the procedure,                        the patient's blood pressure, pulse, and oxygen                        saturations were monitored continuously. The                        Colonoscope was introduced through the anus and                        advanced to the the cecum, identified by appendiceal                        orifice and ileocecal valve. The quality of the bowel                        preparation was good. The patient tolerated the                        procedure well. Findings:      A 1 mm polyp was found in the proximal ascending colon. The polyp was       sessile. The polyp was removed with a cold biopsy forceps. Resection and       retrieval were complete.      Two sessile polyps were found in the rectum. The polyps were less than 1       mm in size. These polyps were removed with a cold biopsy forceps.       Resection and retrieval were complete.      Multiple small and large-mouthed diverticula were found in the sigmoid       colon, descending colon, transverse colon and ascending colon.      A localized area of mildly inflamed mucosa  was found in the distal  rectum, possible related to small anal prolapse. Biopsies were taken       with a cold forceps for histology.      The digital rectal exam findings include decreased sphincter tone. Impression:           - One 1 mm polyp in the proximal ascending colon,                        removed with a cold biopsy forceps. Resected and                        retrieved.                       - Two less than 1 mm polyps in the rectum, removed with                        a cold biopsy forceps. Resected and retrieved.                       - Diverticulosis in the sigmoid colon, in the                        descending colon, in the transverse colon and in the                        ascending colon.                       - Inflamed mucosa in the distal rectum. Biopsied.                       - Decreased sphincter tone found on digital rectal exam. Recommendation:       - Use Citrucel one tablespoon PO daily daily.                       - Return to GI clinic in 4 weeks. Procedure Code(s):    --- Professional ---                       [phone], Colonoscopy, flexible; with biopsy, single or                        multiple Diagnosis Code(s):    --- Professional ---                       D12.2, Benign neoplasm of ascending colon                       K62.1, Rectal polyp                       K62.89, Other specified diseases of anus and rectum                       R19.4, Change in bowel habit                       K57.30, Diverticulosis of large intestine without                        perforation  or abscess without bleeding CPT copyright 2016 American Medical Association. All rights reserved. The codes documented in this report are preliminary and upon coder review may  be revised to meet current compliance requirements. Lollie Sails, MD 01/28/2016 9:55:12 AM This report has been signed electronically. Number of Addenda: 0 Note Initiated On: 01/28/2016 9:14 AM Scope  Withdrawal Time: 0 hours 12 minutes 53 seconds  Total Procedure Duration: 0 hours 25 minutes 31 seconds       [facility]

## 2016-01-28 NOTE — H&P (Signed)
Outpatient short stay form Pre-procedure 01/28/2016 8:58 AM Lollie Sails MD  Primary Physician: Dr. Einar Pheasant  Reason for visit:  Colonoscopy  History of present illness:  Patient is a 70 year old female presenting as above. She has a history of change of bowel habits over the period past 6 months or so with looser stools and more frequent bowel movements as well as some occasional fecal incontinence. She takes no blood thinning agents or aspirin products. She tolerated her prep well.    Current Facility-Administered Medications:  .  0.9 %  sodium chloride infusion, , Intravenous, Continuous, Lollie Sails, MD  Prescriptions Prior to Admission  Medication Sig Dispense Refill Last Dose  . glucosamine-chondroitin 500-400 MG tablet Take 1 tablet by mouth daily. Reported on 10/19/2015   Not Taking  . levothyroxine (SYNTHROID, LEVOTHROID) 50 MCG tablet Take 1 tablet (50 mcg total) by mouth daily. 30 tablet 11 Taking  . lisinopril-hydrochlorothiazide (PRINZIDE,ZESTORETIC) 10-12.5 MG tablet Take 1 tablet by mouth daily. 90 tablet 3 Taking  . loratadine (CLARITIN) 10 MG tablet Take 10 mg by mouth daily.   Taking  . Multiple Vitamins-Minerals (CENTRUM SILVER ULTRA WOMENS PO) Take by mouth.   Taking  . omeprazole (PRILOSEC) 20 MG capsule TAKE 1 CAPSULE (20 MG TOTAL) BY MOUTH DAILY. 30 capsule 2   . rosuvastatin (CRESTOR) 5 MG tablet TAKE 1 TABLET BY MOUTH ONCE A DAY 30 tablet 11 Taking  . sertraline (ZOLOFT) 50 MG tablet Take 1 tablet (50 mg total) by mouth daily. 90 tablet 3 Taking     Allergies  Allergen Reactions  . Augmentin [Amoxicillin-Pot Clavulanate] Rash     Past Medical History:  Diagnosis Date  . Allergic state   . Anemia   . Chronic kidney disease   . Hypercholesterolemia   . Hypertension   . Hypothyroidism   . Osteoporosis, postmenopausal   . Shingles     Review of systems:      Physical Exam    Heart and lungs: Regular rate and rhythm without rub or  gallop, lungs are bilaterally clear.    HEENT: Normocephalic atraumatic eyes are anicteric    Other:     Pertinant exam for procedure: Soft nontender nondistended bowel sounds positive normoactive.    Planned proceedures: Colonoscopy and indicated procedures. I have discussed the risks benefits and complications of procedures to include not limited to bleeding, infection, perforation and the risk of sedation and the patient wishes to proceed.    Lollie Sails, MD Gastroenterology 01/28/2016  8:58 AM

## 2016-01-28 NOTE — Transfer of Care (Signed)
Immediate Anesthesia Transfer of Care Note  Patient: Savannah Cox  Procedure(s) Performed: Procedure(s): COLONOSCOPY WITH PROPOFOL (N/A)  Patient Location: PACU  Anesthesia Type:General  Level of Consciousness: awake  Airway & Oxygen Therapy: Patient Spontanous Breathing and Patient connected to nasal cannula oxygen  Post-op Assessment: Report given to RN and Post -op Vital signs reviewed and stable  Post vital signs: Reviewed and stable  Last Vitals:  Vitals:   01/28/16 0901  BP: 117/71  Pulse: 68  Resp: 17  Temp: 36.8 C    Last Pain:  Vitals:   01/28/16 0901  TempSrc: Tympanic         Complications: No apparent anesthesia complications

## 2016-01-28 NOTE — Anesthesia Postprocedure Evaluation (Signed)
Anesthesia Post Note  Patient: Savannah Cox  Procedure(s) Performed: Procedure(s) (LRB): COLONOSCOPY WITH PROPOFOL (N/A)  Patient location during evaluation: PACU Anesthesia Type: General Level of consciousness: awake and alert and oriented Pain management: pain level controlled Vital Signs Assessment: post-procedure vital signs reviewed and stable Respiratory status: spontaneous breathing Cardiovascular status: blood pressure returned to baseline Anesthetic complications: no    Last Vitals:  Vitals:   01/28/16 1020 01/28/16 1030  BP:    Pulse: 62 61  Resp:    Temp:      Last Pain:  Vitals:   01/28/16 0956  TempSrc: Tympanic                 Americo Vallery

## 2016-01-28 NOTE — Anesthesia Preprocedure Evaluation (Signed)
Anesthesia Evaluation  Patient identified by MRN, date of birth, ID band Patient awake    Reviewed: Allergy & Precautions, NPO status , Patient's Chart, lab work & pertinent test results  Airway Mallampati: III  TM Distance: <3 FB     Dental no notable dental hx. (+) Caps, Chipped   Pulmonary neg pulmonary ROS, former smoker,    Pulmonary exam normal        Cardiovascular hypertension, Pt. on medications Normal cardiovascular exam     Neuro/Psych negative neurological ROS  negative psych ROS   GI/Hepatic Neg liver ROS, GERD  Medicated,  Endo/Other  Hypothyroidism   Renal/GU Renal InsufficiencyRenal disease  negative genitourinary   Musculoskeletal negative musculoskeletal ROS (+)   Abdominal Normal abdominal exam  (+)   Peds negative pediatric ROS (+)  Hematology  (+) anemia ,   Anesthesia Other Findings   Reproductive/Obstetrics                             Anesthesia Physical Anesthesia Plan  ASA: III  Anesthesia Plan: General   Post-op Pain Management:    Induction: Intravenous  Airway Management Planned: Nasal Cannula  Additional Equipment:   Intra-op Plan:   Post-operative Plan:   Informed Consent: I have reviewed the patients History and Physical, chart, labs and discussed the procedure including the risks, benefits and alternatives for the proposed anesthesia with the patient or authorized representative who has indicated his/her understanding and acceptance.   Dental advisory given  Plan Discussed with: CRNA and Surgeon  Anesthesia Plan Comments:         Anesthesia Quick Evaluation

## 2016-01-28 NOTE — Anesthesia Procedure Notes (Signed)
Date/Time: 01/28/2016 9:20 AM Performed by: Allean Found Pre-anesthesia Checklist: Patient identified, Emergency Drugs available, Suction available, Patient being monitored and Timeout performed Patient Re-evaluated:Patient Re-evaluated prior to inductionOxygen Delivery Method: Nasal cannula Intubation Type: IV induction

## 2016-01-29 ENCOUNTER — Encounter: Payer: Self-pay | Admitting: Gastroenterology

## 2016-01-30 LAB — SURGICAL PATHOLOGY

## 2016-01-31 ENCOUNTER — Encounter: Payer: Self-pay | Admitting: Internal Medicine

## 2016-01-31 DIAGNOSIS — Z86010 Personal history of colonic polyps: Secondary | ICD-10-CM | POA: Insufficient documentation

## 2016-02-03 ENCOUNTER — Other Ambulatory Visit: Payer: Self-pay | Admitting: Internal Medicine

## 2016-02-13 ENCOUNTER — Other Ambulatory Visit (INDEPENDENT_AMBULATORY_CARE_PROVIDER_SITE_OTHER): Payer: Medicare Other

## 2016-02-13 DIAGNOSIS — E7800 Pure hypercholesterolemia, unspecified: Secondary | ICD-10-CM | POA: Diagnosis not present

## 2016-02-13 DIAGNOSIS — E039 Hypothyroidism, unspecified: Secondary | ICD-10-CM | POA: Diagnosis not present

## 2016-02-13 DIAGNOSIS — I10 Essential (primary) hypertension: Secondary | ICD-10-CM | POA: Diagnosis not present

## 2016-02-13 LAB — LIPID PANEL
CHOL/HDL RATIO: 3
Cholesterol: 205 mg/dL — ABNORMAL HIGH (ref 0–200)
HDL: 70.6 mg/dL (ref >39.00)
LDL CALC: 118 mg/dL — AB (ref 0–99)
NONHDL: 134.15
Triglycerides: 82 mg/dL (ref 0.0–149.0)
VLDL: 16.4 mg/dL (ref 0.0–40.0)

## 2016-02-13 LAB — BASIC METABOLIC PANEL
BUN: 20 mg/dL (ref 6–23)
CHLORIDE: 106 meq/L (ref 96–112)
CO2: 28 meq/L (ref 19–32)
Calcium: 8.9 mg/dL (ref 8.4–10.5)
Creatinine, Ser: 0.83 mg/dL (ref 0.40–1.20)
GFR: 72.62 mL/min (ref >60.00)
GLUCOSE: 99 mg/dL (ref 70–99)
POTASSIUM: 3.9 meq/L (ref 3.5–5.1)
SODIUM: 140 meq/L (ref 135–145)

## 2016-02-13 LAB — HEPATIC FUNCTION PANEL
ALT: 26 U/L (ref 0–35)
AST: 33 U/L (ref 0–37)
Albumin: 4 g/dL (ref 3.5–5.2)
Alkaline Phosphatase: 59 U/L (ref 39–117)
BILIRUBIN TOTAL: 0.4 mg/dL (ref 0.2–1.2)
Bilirubin, Direct: 0 mg/dL (ref 0.0–0.3)
Total Protein: 6.7 g/dL (ref 6.0–8.3)

## 2016-02-13 LAB — CBC WITH DIFFERENTIAL/PLATELET
BASOS PCT: 1.8 % (ref 0.0–3.0)
Basophils Absolute: 0.1 10*3/uL (ref 0.0–0.1)
EOS PCT: 5.5 % — AB (ref 0.0–5.0)
Eosinophils Absolute: 0.3 10*3/uL (ref 0.0–0.7)
HEMATOCRIT: 39.6 % (ref 36.0–46.0)
HEMOGLOBIN: 13.6 g/dL (ref 12.0–15.0)
Lymphocytes Relative: 30 % (ref 12.0–46.0)
Lymphs Abs: 1.4 10*3/uL (ref 0.7–4.0)
MCHC: 34.4 g/dL (ref 30.0–36.0)
MCV: 93.8 fl (ref 78.0–100.0)
MONO ABS: 0.7 10*3/uL (ref 0.1–1.0)
Monocytes Relative: 16.1 % — ABNORMAL HIGH (ref 3.0–12.0)
Neutro Abs: 2.1 10*3/uL (ref 1.4–7.7)
Neutrophils Relative %: 46.6 % (ref 43.0–77.0)
Platelets: 243 10*3/uL (ref 150.0–400.0)
RBC: 4.22 Mil/uL (ref 3.87–5.11)
RDW: 14.1 % (ref 11.5–15.5)
WBC: 4.6 10*3/uL (ref 4.0–10.5)

## 2016-02-13 LAB — TSH: TSH: 2.18 u[IU]/mL (ref 0.35–4.50)

## 2016-02-14 ENCOUNTER — Encounter: Payer: Self-pay | Admitting: Internal Medicine

## 2016-02-19 ENCOUNTER — Encounter: Payer: Self-pay | Admitting: Internal Medicine

## 2016-02-19 ENCOUNTER — Ambulatory Visit (INDEPENDENT_AMBULATORY_CARE_PROVIDER_SITE_OTHER): Payer: Medicare Other | Admitting: Internal Medicine

## 2016-02-19 VITALS — BP 110/70 | HR 63 | Temp 98.1°F | Resp 18 | Ht 63.0 in

## 2016-02-19 DIAGNOSIS — I10 Essential (primary) hypertension: Secondary | ICD-10-CM

## 2016-02-19 DIAGNOSIS — Z23 Encounter for immunization: Secondary | ICD-10-CM | POA: Diagnosis not present

## 2016-02-19 DIAGNOSIS — D649 Anemia, unspecified: Secondary | ICD-10-CM

## 2016-02-19 DIAGNOSIS — K219 Gastro-esophageal reflux disease without esophagitis: Secondary | ICD-10-CM

## 2016-02-19 DIAGNOSIS — E039 Hypothyroidism, unspecified: Secondary | ICD-10-CM

## 2016-02-19 DIAGNOSIS — Z0000 Encounter for general adult medical examination without abnormal findings: Secondary | ICD-10-CM | POA: Diagnosis not present

## 2016-02-19 DIAGNOSIS — Z1159 Encounter for screening for other viral diseases: Secondary | ICD-10-CM

## 2016-02-19 DIAGNOSIS — Z86010 Personal history of colonic polyps: Secondary | ICD-10-CM

## 2016-02-19 DIAGNOSIS — E7800 Pure hypercholesterolemia, unspecified: Secondary | ICD-10-CM

## 2016-02-19 NOTE — Progress Notes (Signed)
Patient ID: Savannah Cox, female   DOB: 1946-06-09, 70 y.o.   MRN: [id number]   Subjective:    Patient ID: Savannah Cox, female    DOB: 02/19/1946, 70 y.o.   MRN: [id number]  HPI  Patient here for her physical exam.  She states she is doing well.  Stays active.  No cardiac symptoms with increased activity or exertion.  No sob.  No acid reflux.  No abdominal pain or cramping.  Bowels stable.  Just saw GI.  Had colonoscopy 01/28/16.  Recommended f/u in five years.     Past Medical History:  Diagnosis Date  . Allergic state   . Anemia   . Chronic kidney disease   . Hypercholesterolemia   . Hypertension   . Hypothyroidism   . Osteoporosis, postmenopausal   . Shingles    Past Surgical History:  Procedure Laterality Date  . BREAST BIOPSY     left, benign  . COLONOSCOPY WITH PROPOFOL N/A 01/28/2016   Procedure: COLONOSCOPY WITH PROPOFOL;  Surgeon: Lollie Sails, MD;  Location: [facility] ENDOSCOPY;  Service: Endoscopy;  Laterality: N/A;  . EYE SURGERY Left    Family History  Problem Relation Age of Onset  . Heart disease Father     died age 95 - myocardial infarction  . Hypercholesterolemia Mother   . Arthritis Mother   . Kidney cancer      aunt  . Breast cancer Maternal Aunt   . Colon cancer Neg Hx    Social History   Social History  . Marital status: Married    Spouse name: N/A  . Number of children: 3  . Years of education: 31   Occupational History  . retired Pharmacist, hospital   .  Retired   Social History Main Topics  . Smoking status: Former Research scientist (life sciences)  . Smokeless tobacco: Never Used  . Alcohol use 0.0 oz/week     Comment: Rare drink  . Drug use: No  . Sexual activity: Not Asked   Other Topics Concern  . None   Social History Narrative  . None    Outpatient Encounter Prescriptions as of 02/19/2016  Medication Sig  . levothyroxine (SYNTHROID, LEVOTHROID) 50 MCG tablet Take 1 tablet (50 mcg total) by mouth daily.  Marland Kitchen lisinopril-hydrochlorothiazide  (PRINZIDE,ZESTORETIC) 10-12.5 MG tablet Take 1 tablet by mouth daily.  Marland Kitchen loratadine (CLARITIN) 10 MG tablet Take 10 mg by mouth daily.  . Multiple Vitamins-Minerals (CENTRUM SILVER ULTRA WOMENS PO) Take by mouth.  Marland Kitchen omeprazole (PRILOSEC) 20 MG capsule TAKE 1 CAPSULE (20 MG TOTAL) BY MOUTH DAILY.  . rosuvastatin (CRESTOR) 5 MG tablet TAKE 1 TABLET BY MOUTH ONCE A DAY  . sertraline (ZOLOFT) 50 MG tablet Take 1 tablet (50 mg total) by mouth daily.  . [DISCONTINUED] glucosamine-chondroitin 500-400 MG tablet Take 1 tablet by mouth daily. Reported on 10/19/2015   No facility-administered encounter medications on file as of 02/19/2016.     Review of Systems  Constitutional: Negative for appetite change and unexpected weight change.  HENT: Negative for congestion and sinus pressure.   Eyes: Negative for pain and visual disturbance.  Respiratory: Negative for cough, chest tightness and shortness of breath.   Cardiovascular: Negative for chest pain, palpitations and leg swelling.  Gastrointestinal: Negative for abdominal pain, diarrhea, nausea and vomiting.  Genitourinary: Negative for difficulty urinating and dysuria.  Musculoskeletal: Negative for back pain and joint swelling.  Skin: Negative for color change and rash.  Neurological: Negative for dizziness, light-headedness and headaches.  Hematological: Negative for adenopathy. Does not bruise/bleed easily.  Psychiatric/Behavioral: Negative for agitation and dysphoric mood.       Objective:    Physical Exam  Constitutional: She is oriented to person, place, and time. She appears well-developed and well-nourished. No distress.  HENT:  Nose: Nose normal.  Mouth/Throat: Oropharynx is clear and moist.  Eyes: Right eye exhibits no discharge. Left eye exhibits no discharge. No scleral icterus.  Neck: Neck supple. No thyromegaly present.  Cardiovascular: Normal rate and regular rhythm.   Pulmonary/Chest: Breath sounds normal. No accessory muscle  usage. No tachypnea. No respiratory distress. She has no decreased breath sounds. She has no wheezes. She has no rhonchi. Right breast exhibits no inverted nipple, no mass, no nipple discharge and no tenderness (no axillary adenopathy). Left breast exhibits no inverted nipple, no mass, no nipple discharge and no tenderness (no axilarry adenopathy).  Abdominal: Soft. Bowel sounds are normal. There is no tenderness.  Musculoskeletal: She exhibits no edema or tenderness.  Lymphadenopathy:    She has no cervical adenopathy.  Neurological: She is alert and oriented to person, place, and time.  Skin: Skin is warm. No rash noted. No erythema.  Psychiatric: She has a normal mood and affect. Her behavior is normal.    BP 110/70   Pulse 63   Temp 98.1 F (36.7 C) (Oral)   Resp 18   Ht 5\' 3"  (1.6 m)   Wt (P) 144 lb 8 oz (65.5 kg)   SpO2 96%   BMI (P) 25.60 kg/m  Wt Readings from Last 3 Encounters:  02/19/16 (P) 144 lb 8 oz (65.5 kg)  01/28/16 144 lb (65.3 kg)  10/19/15 141 lb 8 oz (64.2 kg)     Lab Results  Component Value Date   WBC 4.6 02/13/2016   HGB 13.6 02/13/2016   HCT 39.6 02/13/2016   PLT 243.0 02/13/2016   GLUCOSE 99 02/13/2016   CHOL 205 (H) 02/13/2016   TRIG 82.0 02/13/2016   HDL 70.60 02/13/2016   LDLDIRECT 165.9 05/11/2013   LDLCALC 118 (H) 02/13/2016   ALT 26 02/13/2016   AST 33 02/13/2016   NA 140 02/13/2016   K 3.9 02/13/2016   CL 106 02/13/2016   CREATININE 0.83 02/13/2016   BUN 20 02/13/2016   CO2 28 02/13/2016   TSH 2.18 02/13/2016       Assessment & Plan:   Problem List Items Addressed This Visit    Anemia    Saw GI.  Colonoscopy as outlined.  Follow cbc.       GERD (gastroesophageal reflux disease)    Controlled on omeprazole.       Health care maintenance    Physical today 02/19/16.  PAP 04/29/12 - negative with negative HPV.  Mammogram 07/05/15 - Birads II.  Colonoscopy 01/28/16 as outlined.  Recommended f/u colonoscopy in five years.         History of colonic polyps    Just had colonoscopy 01/28/16.  See report.  Recommended f/u colonoscopy in five years.        Hypercholesterolemia    On crestor and tolerating.  Low cholesterol diet and exercise.  Follow lipid panel and liver function tests.   Lab Results  Component Value Date   CHOL 205 (H) 02/13/2016   HDL 70.60 02/13/2016   LDLCALC 118 (H) 02/13/2016   LDLDIRECT 165.9 05/11/2013   TRIG 82.0 02/13/2016   CHOLHDL 3 02/13/2016        Relevant Orders   Lipid  panel   Hepatic function panel   Hypertension    Blood pressure under good control.  Continue same medication regimen.  Follow pressures.  Follow metabolic panel.        Relevant Orders   Basic metabolic panel   Hypothyroidism    On thyroid replacement.  Follow tsh.        Other Visit Diagnoses    Need for hepatitis C screening test    -  Primary   Relevant Orders   Hepatitis C antibody (Completed)   Encounter for immunization       Relevant Orders   Flu vaccine HIGH DOSE PF (Completed)       Einar Pheasant, MD

## 2016-02-19 NOTE — Progress Notes (Signed)
Pre visit review using our clinic review tool, if applicable. No additional management support is needed unless otherwise documented below in the visit note. 

## 2016-02-20 ENCOUNTER — Encounter: Payer: Self-pay | Admitting: Internal Medicine

## 2016-02-20 LAB — HEPATITIS C ANTIBODY: HCV AB: NEGATIVE

## 2016-02-24 ENCOUNTER — Encounter: Payer: Self-pay | Admitting: Internal Medicine

## 2016-02-24 NOTE — Assessment & Plan Note (Signed)
Saw GI.  Colonoscopy as outlined.  Follow cbc.

## 2016-02-24 NOTE — Assessment & Plan Note (Signed)
Just had colonoscopy 01/28/16.  See report.  Recommended f/u colonoscopy in five years.

## 2016-02-24 NOTE — Assessment & Plan Note (Signed)
On crestor and tolerating.  Low cholesterol diet and exercise.  Follow lipid panel and liver function tests.   Lab Results  Component Value Date   CHOL 205 (H) 02/13/2016   HDL 70.60 02/13/2016   LDLCALC 118 (H) 02/13/2016   LDLDIRECT 165.9 05/11/2013   TRIG 82.0 02/13/2016   CHOLHDL 3 02/13/2016

## 2016-02-24 NOTE — Assessment & Plan Note (Signed)
On thyroid replacement.  Follow tsh.  

## 2016-02-24 NOTE — Assessment & Plan Note (Signed)
Physical today 02/19/16.  PAP 04/29/12 - negative with negative HPV.  Mammogram 07/05/15 - Birads II.  Colonoscopy 01/28/16 as outlined.  Recommended f/u colonoscopy in five years.

## 2016-02-24 NOTE — Assessment & Plan Note (Signed)
Controlled on omeprazole.   

## 2016-02-24 NOTE — Assessment & Plan Note (Signed)
Blood pressure under good control.  Continue same medication regimen.  Follow pressures.  Follow metabolic panel.   

## 2016-03-17 ENCOUNTER — Ambulatory Visit (INDEPENDENT_AMBULATORY_CARE_PROVIDER_SITE_OTHER): Payer: Medicare Other

## 2016-03-17 VITALS — BP 110/70 | HR 66 | Temp 97.9°F | Resp 12 | Ht 63.0 in | Wt 146.8 lb

## 2016-03-17 DIAGNOSIS — Z0000 Encounter for general adult medical examination without abnormal findings: Secondary | ICD-10-CM

## 2016-03-17 NOTE — Patient Instructions (Addendum)
Savannah Cox , Thank you for taking time to come for your Medicare Wellness Visit. I appreciate your ongoing commitment to your health goals. Please review the following plan we discussed and let me know if I can assist you in the future.   FOLLOW UP WITH DR. Nicki Reaper AS NEEDED.  RETURN A COPY OF ADVANCED DIRECTIVES WHEN COMPLETED.  These are the goals we discussed: Goals    . Increase physical activity          CONTINUE PHYSICAL THERAPY EXERCISES FOR HIP AT HOME. ADD YOGA CLASS 1 DAY A WEEK TO CURRENT SILVER SNEAKER REGIMEN.   DECREASE WEIGHT LOAD WHEN USING RIGHT ARM, DUE TO FROZEN SHOULDER.       This is a list of the screening recommended for you and due dates:  Health Maintenance  Topic Date Due  . Mammogram  06/24/2016  . Tetanus Vaccine  12/08/2020  . Colon Cancer Screening  01/27/2021  . Flu Shot  Completed  . DEXA scan (bone density measurement)  Completed  . Shingles Vaccine  Completed  .  Hepatitis C: One time screening is recommended by Center for Disease Control  (CDC) for  adults born from [birth year] through 1963.   Completed  . Pneumonia vaccines  Completed    Health Maintenance, Female Adopting a healthy lifestyle and getting preventive care can go a long way to promote health and wellness. Talk with your health care provider about what schedule of regular examinations is right for you. This is a good chance for you to check in with your provider about disease prevention and staying healthy. In between checkups, there are plenty of things you can do on your own. Experts have done a lot of research about which lifestyle changes and preventive measures are most likely to keep you healthy. Ask your health care provider for more information. WEIGHT AND DIET  Eat a healthy diet  Be sure to include plenty of vegetables, fruits, low-fat dairy products, and lean protein.  Do not eat a lot of foods high in solid fats, added sugars, or salt.  Get regular exercise. This is one of  the most important things you can do for your health.  Most adults should exercise for at least 150 minutes each week. The exercise should increase your heart rate and make you sweat (moderate-intensity exercise).  Most adults should also do strengthening exercises at least twice a week. This is in addition to the moderate-intensity exercise.  Maintain a healthy weight  Body mass index (BMI) is a measurement that can be used to identify possible weight problems. It estimates body fat based on height and weight. Your health care provider can help determine your BMI and help you achieve or maintain a healthy weight.  For females 15 years of age and older:   A BMI below 18.5 is considered underweight.  A BMI of 18.5 to 24.9 is normal.  A BMI of 25 to 29.9 is considered overweight.  A BMI of 30 and above is considered obese.  Watch levels of cholesterol and blood lipids  You should start having your blood tested for lipids and cholesterol at 68 years of age, then have this test every 5 years.  You may need to have your cholesterol levels checked more often if:  Your lipid or cholesterol levels are high.  You are older than 68 years of age.  You are at high risk for heart disease.  CANCER SCREENING   Lung Cancer  Lung cancer  screening is recommended for adults 33-17 years old who are at high risk for lung cancer because of a history of smoking.  A yearly low-dose CT scan of the lungs is recommended for people who:  Currently smoke.  Have quit within the past 15 years.  Have at least a 30-pack-year history of smoking. A pack year is smoking an average of one pack of cigarettes a day for 1 year.  Yearly screening should continue until it has been 15 years since you quit.  Yearly screening should stop if you develop a health problem that would prevent you from having lung cancer treatment.  Breast Cancer  Practice breast self-awareness. This means understanding how your  breasts normally appear and feel.  It also means doing regular breast self-exams. Let your health care provider know about any changes, no matter how small.  If you are in your 20s or 30s, you should have a clinical breast exam (CBE) by a health care provider every 1-3 years as part of a regular health exam.  If you are 65 or older, have a CBE every year. Also consider having a breast X-ray (mammogram) every year.  If you have a family history of breast cancer, talk to your health care provider about genetic screening.  If you are at high risk for breast cancer, talk to your health care provider about having an MRI and a mammogram every year.  Breast cancer gene (BRCA) assessment is recommended for women who have family members with BRCA-related cancers. BRCA-related cancers include:  Breast.  Ovarian.  Tubal.  Peritoneal cancers.  Results of the assessment will determine the need for genetic counseling and BRCA1 and BRCA2 testing. Cervical Cancer Your health care provider may recommend that you be screened regularly for cancer of the pelvic organs (ovaries, uterus, and vagina). This screening involves a pelvic examination, including checking for microscopic changes to the surface of your cervix (Pap test). You may be encouraged to have this screening done every 3 years, beginning at age 4.  For women ages 42-65, health care providers may recommend pelvic exams and Pap testing every 3 years, or they may recommend the Pap and pelvic exam, combined with testing for human papilloma virus (HPV), every 5 years. Some types of HPV increase your risk of cervical cancer. Testing for HPV may also be done on women of any age with unclear Pap test results.  Other health care providers may not recommend any screening for nonpregnant women who are considered low risk for pelvic cancer and who do not have symptoms. Ask your health care provider if a screening pelvic exam is right for you.  If you  have had past treatment for cervical cancer or a condition that could lead to cancer, you need Pap tests and screening for cancer for at least 20 years after your treatment. If Pap tests have been discontinued, your risk factors (such as having a new sexual partner) need to be reassessed to determine if screening should resume. Some women have medical problems that increase the chance of getting cervical cancer. In these cases, your health care provider may recommend more frequent screening and Pap tests. Colorectal Cancer  This type of cancer can be detected and often prevented.  Routine colorectal cancer screening usually begins at 68 years of age and continues through 68 years of age.  Your health care provider may recommend screening at an earlier age if you have risk factors for colon cancer.  Your health care provider may  also recommend using home test kits to check for hidden blood in the stool.  A small camera at the end of a tube can be used to examine your colon directly (sigmoidoscopy or colonoscopy). This is done to check for the earliest forms of colorectal cancer.  Routine screening usually begins at age 73.  Direct examination of the colon should be repeated every 5-10 years through 68 years of age. However, you may need to be screened more often if early forms of precancerous polyps or small growths are found. Skin Cancer  Check your skin from head to toe regularly.  Tell your health care provider about any new moles or changes in moles, especially if there is a change in a mole's shape or color.  Also tell your health care provider if you have a mole that is larger than the size of a pencil eraser.  Always use sunscreen. Apply sunscreen liberally and repeatedly throughout the day.  Protect yourself by wearing long sleeves, pants, a wide-brimmed hat, and sunglasses whenever you are outside. HEART DISEASE, DIABETES, AND HIGH BLOOD PRESSURE   High blood pressure causes  heart disease and increases the risk of stroke. High blood pressure is more likely to develop in:  People who have blood pressure in the high end of the normal range (130-139/85-89 mm Hg).  People who are overweight or obese.  People who are African American.  If you are 28-18 years of age, have your blood pressure checked every 3-5 years. If you are 54 years of age or older, have your blood pressure checked every year. You should have your blood pressure measured twice--once when you are at a hospital or clinic, and once when you are not at a hospital or clinic. Record the average of the two measurements. To check your blood pressure when you are not at a hospital or clinic, you can use:  An automated blood pressure machine at a pharmacy.  A home blood pressure monitor.  If you are between 69 years and 29 years old, ask your health care provider if you should take aspirin to prevent strokes.  Have regular diabetes screenings. This involves taking a blood sample to check your fasting blood sugar level.  If you are at a normal weight and have a low risk for diabetes, have this test once every three years after 68 years of age.  If you are overweight and have a high risk for diabetes, consider being tested at a younger age or more often. PREVENTING INFECTION  Hepatitis B  If you have a higher risk for hepatitis B, you should be screened for this virus. You are considered at high risk for hepatitis B if:  You were born in a country where hepatitis B is common. Ask your health care provider which countries are considered high risk.  Your parents were born in a high-risk country, and you have not been immunized against hepatitis B (hepatitis B vaccine).  You have HIV or AIDS.  You use needles to inject street drugs.  You live with someone who has hepatitis B.  You have had sex with someone who has hepatitis B.  You get hemodialysis treatment.  You take certain medicines for  conditions, including cancer, organ transplantation, and autoimmune conditions. Hepatitis C  Blood testing is recommended for:  Everyone born from [birth year] through 1963.  Anyone with known risk factors for hepatitis C. Sexually transmitted infections (STIs)  You should be screened for sexually transmitted infections (STIs) including gonorrhea  and chlamydia if:  You are sexually active and are younger than 68 years of age.  You are older than 68 years of age and your health care provider tells you that you are at risk for this type of infection.  Your sexual activity has changed since you were last screened and you are at an increased risk for chlamydia or gonorrhea. Ask your health care provider if you are at risk.  If you do not have HIV, but are at risk, it may be recommended that you take a prescription medicine daily to prevent HIV infection. This is called pre-exposure prophylaxis (PrEP). You are considered at risk if:  You are sexually active and do not regularly use condoms or know the HIV status of your partner(s).  You take drugs by injection.  You are sexually active with a partner who has HIV. Talk with your health care provider about whether you are at high risk of being infected with HIV. If you choose to begin PrEP, you should first be tested for HIV. You should then be tested every 3 months for as long as you are taking PrEP.  PREGNANCY   If you are premenopausal and you may become pregnant, ask your health care provider about preconception counseling.  If you may become pregnant, take 400 to 800 micrograms (mcg) of folic acid every day.  If you want to prevent pregnancy, talk to your health care provider about birth control (contraception). OSTEOPOROSIS AND MENOPAUSE   Osteoporosis is a disease in which the bones lose minerals and strength with aging. This can result in serious bone fractures. Your risk for osteoporosis can be identified using a bone density scan.  If  you are 23 years of age or older, or if you are at risk for osteoporosis and fractures, ask your health care provider if you should be screened.  Ask your health care provider whether you should take a calcium or vitamin D supplement to lower your risk for osteoporosis.  Menopause may have certain physical symptoms and risks.  Hormone replacement therapy may reduce some of these symptoms and risks. Talk to your health care provider about whether hormone replacement therapy is right for you.  HOME CARE INSTRUCTIONS   Schedule regular health, dental, and eye exams.  Stay current with your immunizations.   Do not use any tobacco products including cigarettes, chewing tobacco, or electronic cigarettes.  If you are pregnant, do not drink alcohol.  If you are breastfeeding, limit how much and how often you drink alcohol.  Limit alcohol intake to no more than 1 drink per day for nonpregnant women. One drink equals 12 ounces of beer, 5 ounces of wine, or 1 ounces of hard liquor.  Do not use street drugs.  Do not share needles.  Ask your health care provider for help if you need support or information about quitting drugs.  Tell your health care provider if you often feel depressed.  Tell your health care provider if you have ever been abused or do not feel safe at home.   This information is not intended to replace advice given to you by your health care provider. Make sure you discuss any questions you have with your health care provider.   Document Released: 12/16/2010 Document Revised: 06/23/2014 Document Reviewed: 05/04/2013 Elsevier Interactive Patient Education [employer].

## 2016-03-17 NOTE — Progress Notes (Signed)
Subjective:   Savannah Cox is a 70 y.o. female who presents for Medicare Annual (Subsequent) preventive examination.  Review of Systems:  No ROS.  Medicare Wellness Visit.  Cardiac Risk Factors include: advanced age (>46men, >9 women);hypertension     Objective:     Vitals: BP 110/70 (BP Location: Left Arm, Patient Position: Sitting, Cuff Size: Normal)   Pulse 66   Temp 97.9 F (36.6 C) (Oral)   Resp 12   Ht 5\' 3"  (1.6 m)   Wt 146 lb 12.8 oz (66.6 kg)   SpO2 98%   BMI 26.00 kg/m   Body mass index is 26 kg/m.   Tobacco History  Smoking Status  . Former Smoker  Smokeless Tobacco  . Never Used     Counseling given: Not Answered   Past Medical History:  Diagnosis Date  . Allergic state   . Anemia   . Chronic kidney disease   . Hypercholesterolemia   . Hypertension   . Hypothyroidism   . Osteoporosis, postmenopausal   . Shingles    Past Surgical History:  Procedure Laterality Date  . BREAST BIOPSY     left, benign  . COLONOSCOPY WITH PROPOFOL N/A 01/28/2016   Procedure: COLONOSCOPY WITH PROPOFOL;  Surgeon: Lollie Sails, MD;  Location: [facility] ENDOSCOPY;  Service: Endoscopy;  Laterality: N/A;  . EYE SURGERY Left    Family History  Problem Relation Age of Onset  . Heart disease Father     died age 71 - myocardial infarction  . Hypercholesterolemia Mother   . Arthritis Mother   . Kidney cancer      aunt  . Breast cancer Maternal Aunt   . Colon cancer Neg Hx    History  Sexual Activity  . Sexual activity: Yes    Outpatient Encounter Prescriptions as of 03/17/2016  Medication Sig  . levothyroxine (SYNTHROID, LEVOTHROID) 50 MCG tablet Take 1 tablet (50 mcg total) by mouth daily.  Marland Kitchen lisinopril-hydrochlorothiazide (PRINZIDE,ZESTORETIC) 10-12.5 MG tablet Take 1 tablet by mouth daily.  Marland Kitchen loratadine (CLARITIN) 10 MG tablet Take 10 mg by mouth daily.  . Multiple Vitamins-Minerals (CENTRUM SILVER ULTRA WOMENS PO) Take by mouth.  Marland Kitchen omeprazole  (PRILOSEC) 20 MG capsule TAKE 1 CAPSULE (20 MG TOTAL) BY MOUTH DAILY.  . rosuvastatin (CRESTOR) 5 MG tablet TAKE 1 TABLET BY MOUTH ONCE A DAY  . sertraline (ZOLOFT) 50 MG tablet Take 1 tablet (50 mg total) by mouth daily.   No facility-administered encounter medications on file as of 03/17/2016.     Activities of Daily Living In your present state of health, do you have any difficulty performing the following activities: 03/17/2016  Hearing? N  Vision? N  Difficulty concentrating or making decisions? N  Walking or climbing stairs? N  Dressing or bathing? N  Doing errands, shopping? N  Using the Toilet? N  In the past six months, have you accidently leaked urine? Y  Do you have problems with loss of bowel control? N  Managing your Medications? N  Managing your Finances? N  Housekeeping or managing your Housekeeping? N  Some recent data might be hidden    Patient Care Team: Einar Pheasant, MD as PCP - General (Internal Medicine)    Assessment:    This is a routine wellness examination for Savannah Cox. The goal of the wellness visit is to assist the patient how to close the gaps in care and create a preventative care plan for the patient.   Osteoporosis risk reviewed.  Medications reviewed; taking without issues or barriers.  HTN; controlled with medicaction.  Stable and followed by PCP.  Safety issues reviewed; smoke and carbon detectors in the home. Firearms locked in a safe in the home. Wears seatbelts when driving or riding with others. No violence in the home.  No identified risk were noted; The patient was oriented x 3; appropriate in dress and manner and no objective failures at ADL's or IADL's.   Body mass index; discussed the importance of a healthy diet, water intake and exercise.   Health maintenance gaps; closed.  Patient Concerns: R shoulder pain; Hx of frozen shoulder.  Intermittent pain presents x2 weeks ago. She will monitor and follow up with PCP if more  persistent.  Not currently taking pain medication.  She will start using the heating pad for 20 minutes prior to stretching/exercise and decrease weight load.      Exercise Activities and Dietary recommendations Current Exercise Habits: Structured exercise class, Time (Minutes): 60, Frequency (Times/Week): 2, Weekly Exercise (Minutes/Week): 120, Intensity: Mild  Goals    . Increase physical activity          CONTINUE PHYSICAL THERAPY EXERCISES FOR HIP AT HOME. ADD YOGA CLASS 1 DAY A WEEK TO CURRENT SILVER SNEAKER REGIMEN.   DECREASE WEIGHT LOAD WHEN USING RIGHT ARM, DUE TO FROZEN SHOULDER.      Fall Risk Fall Risk  03/17/2016 02/19/2016 09/04/2014 02/13/2014 04/28/2013  Falls in the past year? No No No No No   Depression Screen PHQ 2/9 Scores 03/17/2016 02/19/2016 09/04/2014 02/13/2014  PHQ - 2 Score 0 0 0 0     Cognitive Testing MMSE - Mini Mental State Exam 03/17/2016  Orientation to time 5  Orientation to Place 5  Registration 3  Attention/ Calculation 5  Recall 3  Language- name 2 objects 2  Language- repeat 1  Language- follow 3 step command 3  Language- read & follow direction 1  Write a sentence 1  Copy design 1  Total score 30    Immunization History  Administered Date(s) Administered  . DTaP 12/09/2010  . Influenza Split 04/29/2012  . Influenza, High Dose Seasonal PF 02/19/2016  . Influenza,inj,Quad PF,36+ Mos 02/21/2013, 02/13/2014, 05/14/2015  . Pneumococcal Conjugate-13 04/28/2013  . Pneumococcal Polysaccharide-23 02/13/2014  . Zoster 10/12/2011   Screening Tests Health Maintenance  Topic Date Due  . MAMMOGRAM  06/24/2016  . TETANUS/TDAP  12/08/2020  . COLONOSCOPY  01/27/2021  . INFLUENZA VACCINE  Completed  . DEXA SCAN  Completed  . ZOSTAVAX  Completed  . Hepatitis C Screening  Completed  . PNA vac Low Risk Adult  Completed      Plan:   End of life planning; Advance aging; Advanced directives discussed. No current HCPOA/Living Will.  Educational  material provided to help her start the conversation with her family.  Copy of short forms requested upon completion.  Time spent discussing this topic is 17 minutes.    Medicare Attestation I have personally reviewed: The patient's medical and social history Their use of alcohol, tobacco or illicit drugs Their current medications and supplements The patient's functional ability including ADLs,fall risks, home safety risks, cognitive, and hearing and visual impairment Diet and physical activities Evidence for depression   The patient's weight, height, BMI, and visual acuity have been recorded in the chart.  I have made referrals and provided education to the patient based on review of the above and I have provided the patient with a written personalized care plan for  preventive services.    During the course of the visit the patient was educated and counseled about the following appropriate screening and preventive services:   Vaccines to include Pneumoccal, Influenza, Hepatitis B, Td, Zostavax, HCV  Electrocardiogram  Cardiovascular Disease  Colorectal cancer screening  Bone density screening  Diabetes screening  Glaucoma screening  Mammography/PAP  Nutrition counseling   Patient Instructions (the written plan) was given to the patient.   Varney Biles, LPN  [id number]   Reviewed.  Agree with plan.  Will call if persistent problems.    Dr Nicki Reaper

## 2016-04-07 ENCOUNTER — Other Ambulatory Visit: Payer: Self-pay | Admitting: Internal Medicine

## 2016-04-14 ENCOUNTER — Encounter: Payer: Self-pay | Admitting: Emergency Medicine

## 2016-04-14 ENCOUNTER — Emergency Department: Payer: Medicare Other

## 2016-04-14 ENCOUNTER — Observation Stay
Admission: EM | Admit: 2016-04-14 | Discharge: 2016-04-17 | Disposition: A | Discharge status: Home / Self Care | Payer: Medicare Other | Attending: Internal Medicine | Admitting: Internal Medicine

## 2016-04-14 DIAGNOSIS — E039 Hypothyroidism, unspecified: Secondary | ICD-10-CM | POA: Insufficient documentation

## 2016-04-14 DIAGNOSIS — N189 Chronic kidney disease, unspecified: Secondary | ICD-10-CM | POA: Insufficient documentation

## 2016-04-14 DIAGNOSIS — R109 Unspecified abdominal pain: Secondary | ICD-10-CM | POA: Diagnosis present

## 2016-04-14 DIAGNOSIS — E7800 Pure hypercholesterolemia, unspecified: Secondary | ICD-10-CM | POA: Insufficient documentation

## 2016-04-14 DIAGNOSIS — E876 Hypokalemia: Secondary | ICD-10-CM | POA: Diagnosis not present

## 2016-04-14 DIAGNOSIS — K625 Hemorrhage of anus and rectum: Secondary | ICD-10-CM | POA: Diagnosis not present

## 2016-04-14 DIAGNOSIS — I959 Hypotension, unspecified: Secondary | ICD-10-CM | POA: Diagnosis not present

## 2016-04-14 DIAGNOSIS — D62 Acute posthemorrhagic anemia: Secondary | ICD-10-CM | POA: Diagnosis not present

## 2016-04-14 DIAGNOSIS — K922 Gastrointestinal hemorrhage, unspecified: Secondary | ICD-10-CM | POA: Diagnosis present

## 2016-04-14 DIAGNOSIS — I129 Hypertensive chronic kidney disease with stage 1 through stage 4 chronic kidney disease, or unspecified chronic kidney disease: Secondary | ICD-10-CM | POA: Insufficient documentation

## 2016-04-14 DIAGNOSIS — Z87891 Personal history of nicotine dependence: Secondary | ICD-10-CM | POA: Insufficient documentation

## 2016-04-14 DIAGNOSIS — K5150 Left sided colitis without complications: Principal | ICD-10-CM | POA: Insufficient documentation

## 2016-04-14 DIAGNOSIS — R197 Diarrhea, unspecified: Secondary | ICD-10-CM

## 2016-04-14 DIAGNOSIS — Z79899 Other long term (current) drug therapy: Secondary | ICD-10-CM | POA: Insufficient documentation

## 2016-04-14 DIAGNOSIS — R519 Headache, unspecified: Secondary | ICD-10-CM

## 2016-04-14 DIAGNOSIS — R51 Headache: Secondary | ICD-10-CM | POA: Diagnosis not present

## 2016-04-14 DIAGNOSIS — K529 Noninfective gastroenteritis and colitis, unspecified: Secondary | ICD-10-CM

## 2016-04-14 LAB — URINALYSIS COMPLETE WITH MICROSCOPIC (ARMC ONLY)
BACTERIA UA: NONE SEEN
Bilirubin Urine: NEGATIVE
Glucose, UA: NEGATIVE mg/dL
Ketones, ur: NEGATIVE mg/dL
LEUKOCYTES UA: NEGATIVE
NITRITE: NEGATIVE
PH: 6 (ref 5.0–8.0)
Protein, ur: NEGATIVE mg/dL
SQUAMOUS EPITHELIAL / LPF: NONE SEEN
Specific Gravity, Urine: 1.027 (ref 1.005–1.030)

## 2016-04-14 LAB — CBC WITH DIFFERENTIAL/PLATELET
BASOS ABS: 0 10*3/uL (ref 0–0.1)
BASOS PCT: 0 %
EOS ABS: 0 10*3/uL (ref 0–0.7)
EOS PCT: 0 %
HCT: 41.9 % (ref 35.0–47.0)
Hemoglobin: 14.3 g/dL (ref 12.0–16.0)
Lymphocytes Relative: 6 %
Lymphs Abs: 0.7 10*3/uL — ABNORMAL LOW (ref 1.0–3.6)
MCH: 32.5 pg (ref 26.0–34.0)
MCHC: 34.1 g/dL (ref 32.0–36.0)
MCV: 95.4 fL (ref 80.0–100.0)
MONO ABS: 0.9 10*3/uL (ref 0.2–0.9)
MONOS PCT: 8 %
Neutro Abs: 10 10*3/uL — ABNORMAL HIGH (ref 1.4–6.5)
Neutrophils Relative %: 86 %
PLATELETS: 194 10*3/uL (ref 150–440)
RBC: 4.4 MIL/uL (ref 3.80–5.20)
RDW: 13.6 % (ref 11.5–14.5)
WBC: 11.6 10*3/uL — ABNORMAL HIGH (ref 3.6–11.0)

## 2016-04-14 LAB — COMPREHENSIVE METABOLIC PANEL
ALBUMIN: 4.2 g/dL (ref 3.5–5.0)
ALT: 30 U/L (ref 14–54)
AST: 48 U/L — AB (ref 15–41)
Alkaline Phosphatase: 69 U/L (ref 38–126)
Anion gap: 12 (ref 5–15)
BUN: 18 mg/dL (ref 6–20)
CHLORIDE: 101 mmol/L (ref 101–111)
CO2: 23 mmol/L (ref 22–32)
CREATININE: 0.71 mg/dL (ref 0.44–1.00)
Calcium: 9.5 mg/dL (ref 8.9–10.3)
GFR calc Af Amer: >60 mL/min (ref >60)
GLUCOSE: 149 mg/dL — AB (ref 65–99)
POTASSIUM: 3.4 mmol/L — AB (ref 3.5–5.1)
Sodium: 136 mmol/L (ref 135–145)
TOTAL PROTEIN: 7.2 g/dL (ref 6.5–8.1)

## 2016-04-14 LAB — TYPE AND SCREEN
ABO/RH(D): A POS
ANTIBODY SCREEN: NEGATIVE

## 2016-04-14 LAB — LIPASE, BLOOD: LIPASE: 34 U/L (ref 11–51)

## 2016-04-14 LAB — PROTIME-INR
INR: 0.96
PROTHROMBIN TIME: 12.8 s (ref 11.4–15.2)

## 2016-04-14 LAB — HEMOGLOBIN
HEMOGLOBIN: 13.3 g/dL (ref 12.0–16.0)
Hemoglobin: 13 g/dL (ref 12.0–16.0)

## 2016-04-14 LAB — TROPONIN I: Troponin I: <0.03 ng/mL (ref <0.03)

## 2016-04-14 MED ORDER — LORATADINE 10 MG PO TABS
10.0000 mg | ORAL_TABLET | Freq: Every day | ORAL | Status: DC
  Administered 2016-04-14 – 2016-04-17 (×4): 10 mg via ORAL
  Filled 2016-04-14 (×4): qty 1

## 2016-04-14 MED ORDER — MORPHINE SULFATE (PF) 2 MG/ML IV SOLN
4.0000 mg | Freq: Once | INTRAVENOUS | Status: AC
  Administered 2016-04-14: 4 mg via INTRAVENOUS
  Filled 2016-04-14: qty 2

## 2016-04-14 MED ORDER — ACETAMINOPHEN 325 MG PO TABS
ORAL_TABLET | ORAL | Status: AC
  Administered 2016-04-14: 650 mg via ORAL
  Filled 2016-04-14: qty 2

## 2016-04-14 MED ORDER — ROSUVASTATIN CALCIUM 10 MG PO TABS
5.0000 mg | ORAL_TABLET | Freq: Every day | ORAL | Status: DC
  Administered 2016-04-15 – 2016-04-17 (×3): 5 mg via ORAL
  Filled 2016-04-14 (×3): qty 1

## 2016-04-14 MED ORDER — ONDANSETRON HCL 4 MG/2ML IJ SOLN
4.0000 mg | Freq: Once | INTRAMUSCULAR | Status: AC
  Administered 2016-04-14: 4 mg via INTRAVENOUS
  Filled 2016-04-14: qty 2

## 2016-04-14 MED ORDER — IOPAMIDOL (ISOVUE-300) INJECTION 61%: 30.0000 mL | Freq: Once | INTRAVENOUS | Status: DC

## 2016-04-14 MED ORDER — METRONIDAZOLE 500 MG PO TABS
500.0000 mg | ORAL_TABLET | Freq: Three times a day (TID) | ORAL | Status: DC
  Administered 2016-04-14 – 2016-04-17 (×9): 500 mg via ORAL
  Filled 2016-04-14 (×9): qty 1

## 2016-04-14 MED ORDER — ONDANSETRON HCL 4 MG/2ML IJ SOLN
4.0000 mg | Freq: Four times a day (QID) | INTRAMUSCULAR | Status: DC | PRN
  Administered 2016-04-15 (×2): 4 mg via INTRAVENOUS
  Filled 2016-04-14 (×2): qty 2

## 2016-04-14 MED ORDER — CIPROFLOXACIN HCL 500 MG PO TABS
500.0000 mg | ORAL_TABLET | Freq: Two times a day (BID) | ORAL | Status: DC
  Administered 2016-04-14 – 2016-04-17 (×6): 500 mg via ORAL
  Filled 2016-04-14 (×7): qty 1

## 2016-04-14 MED ORDER — POTASSIUM CHLORIDE CRYS ER 20 MEQ PO TBCR
40.0000 meq | EXTENDED_RELEASE_TABLET | Freq: Once | ORAL | Status: DC
  Filled 2016-04-14: qty 2

## 2016-04-14 MED ORDER — PANTOPRAZOLE SODIUM 40 MG PO TBEC
40.0000 mg | DELAYED_RELEASE_TABLET | Freq: Every day | ORAL | Status: DC
  Administered 2016-04-14 – 2016-04-15 (×2): 40 mg via ORAL
  Filled 2016-04-14 (×2): qty 1

## 2016-04-14 MED ORDER — LEVOTHYROXINE SODIUM 50 MCG PO TABS
50.0000 ug | ORAL_TABLET | Freq: Every day | ORAL | Status: DC
  Administered 2016-04-15 – 2016-04-17 (×3): 50 ug via ORAL
  Filled 2016-04-14 (×3): qty 1

## 2016-04-14 MED ORDER — IOPAMIDOL (ISOVUE-300) INJECTION 61%
100.0000 mL | Freq: Once | INTRAVENOUS | Status: AC | PRN
  Administered 2016-04-14: 100 mL via INTRAVENOUS

## 2016-04-14 MED ORDER — ONDANSETRON HCL 4 MG PO TABS: 4.0000 mg | ORAL_TABLET | Freq: Four times a day (QID) | ORAL | Status: DC | PRN

## 2016-04-14 MED ORDER — METRONIDAZOLE 500 MG PO TABS
500.0000 mg | ORAL_TABLET | Freq: Once | ORAL | Status: AC
  Administered 2016-04-14: 500 mg via ORAL
  Filled 2016-04-14: qty 1

## 2016-04-14 MED ORDER — SODIUM CHLORIDE 0.9 % IV SOLN
Freq: Once | INTRAVENOUS | Status: AC
  Administered 2016-04-14: 13:00:00 via INTRAVENOUS

## 2016-04-14 MED ORDER — ACETAMINOPHEN 325 MG PO TABS
650.0000 mg | ORAL_TABLET | Freq: Four times a day (QID) | ORAL | Status: DC | PRN
  Administered 2016-04-14 – 2016-04-15 (×3): 650 mg via ORAL
  Filled 2016-04-14 (×2): qty 2

## 2016-04-14 MED ORDER — CIPROFLOXACIN HCL 500 MG PO TABS
500.0000 mg | ORAL_TABLET | Freq: Once | ORAL | Status: AC
  Administered 2016-04-14: 500 mg via ORAL
  Filled 2016-04-14: qty 1

## 2016-04-14 MED ORDER — SODIUM CHLORIDE 0.9 % IV SOLN
INTRAVENOUS | Status: DC
  Administered 2016-04-14 – 2016-04-15 (×3): via INTRAVENOUS

## 2016-04-14 MED ORDER — ALBUTEROL SULFATE (2.5 MG/3ML) 0.083% IN NEBU
2.5000 mg | INHALATION_SOLUTION | RESPIRATORY_TRACT | Status: DC | PRN
Start: 2016-04-14 — End: 2016-04-17

## 2016-04-14 MED ORDER — SERTRALINE HCL 50 MG PO TABS
50.0000 mg | ORAL_TABLET | Freq: Every day | ORAL | Status: DC
  Administered 2016-04-14 – 2016-04-17 (×4): 50 mg via ORAL
  Filled 2016-04-14 (×4): qty 1

## 2016-04-14 MED ORDER — OXYCODONE-ACETAMINOPHEN 5-325 MG PO TABS
1.0000 | ORAL_TABLET | Freq: Four times a day (QID) | ORAL | Status: DC | PRN
  Administered 2016-04-14: 1 via ORAL
  Administered 2016-04-15: 2 via ORAL
  Filled 2016-04-14: qty 2
  Filled 2016-04-14: qty 1

## 2016-04-14 MED ORDER — ACETAMINOPHEN 650 MG RE SUPP: 650.0000 mg | Freq: Four times a day (QID) | RECTAL | Status: DC | PRN

## 2016-04-14 NOTE — ED Notes (Signed)
Lab advised that blue and lavender tubes clotted and need to be redrawn

## 2016-04-14 NOTE — Progress Notes (Signed)
Spoke to Dr. Bridgett Larsson as pt was requesting something for pain. Per MD place order for Percocet 5/325 q 6 hours prn

## 2016-04-14 NOTE — Progress Notes (Signed)
Notified Dr. Bridgett Larsson that patient had a small bowel movement that was straight blood. No new orders received will continue to monitor hemoglobins

## 2016-04-14 NOTE — ED Notes (Signed)
Patient transported to CT 

## 2016-04-14 NOTE — Plan of Care (Signed)
Problem: Bowel/Gastric: Goal: Will show no signs and symptoms of gastrointestinal bleeding Outcome: Not Progressing Pt is having bleeding

## 2016-04-14 NOTE — Care Management Obs Status (Signed)
[place] NOTIFICATION   Patient Details  Name: Savannah Cox MRN: [id number] Date of Birth: 1945-11-18   Medicare Observation Status Notification Given:  Yes    Beau Fanny, RN 04/14/2016, 12:49 PM

## 2016-04-14 NOTE — ED Notes (Signed)
Admitting Dr at pt's bedside.

## 2016-04-14 NOTE — ED Triage Notes (Signed)
C/O abdominal pain and bloating since 0100, had an episode of diarrhea and rectal bleeding overnight.  Reports 5-6 episodes since 0500.

## 2016-04-14 NOTE — ED Provider Notes (Signed)
[facility] Emergency Department Provider Note        Time seen: ----------------------------------------- 9:46 AM on 04/14/2016 -----------------------------------------    I have reviewed the triage vital signs and the nursing notes.   HISTORY  Chief Complaint Abdominal Pain and Rectal Bleeding    HPI Savannah Cox is a 70 y.o. female who presents to ER for abdominal pain and bloating since 1 AM. Patient had episode of diarrhea and rectal bleeding overnight. She reports 5-6 episodes since 5:00 this morning. She's never had this before, nothing makes it better or worse. She describes pain in the left lower quadrant. She denies fevers, chills or other complaints.   Past Medical History:  Diagnosis Date  . Allergic state   . Anemia   . Chronic kidney disease   . Hypercholesterolemia   . Hypertension   . Hypothyroidism   . Osteoporosis, postmenopausal   . Shingles     Patient Active Problem List   Diagnosis Date Noted  . History of colonic polyps 01/31/2016  . Change in bowel movement 10/21/2015  . Pain of both thumbs 01/14/2015  . Right hip pain 01/10/2015  . Health care maintenance 09/04/2014  . Abnormal mammogram 09/25/2013  . Abdominal pain 02/22/2013  . Anemia 05/01/2012  . GERD (gastroesophageal reflux disease) 05/01/2012  . Hematuria 05/01/2012  . Hypothyroidism 05/01/2012  . Hypercholesterolemia 05/01/2012  . Hypertension 05/01/2012    Past Surgical History:  Procedure Laterality Date  . BREAST BIOPSY     left, benign  . COLONOSCOPY WITH PROPOFOL N/A 01/28/2016   Procedure: COLONOSCOPY WITH PROPOFOL;  Surgeon: Lollie Sails, MD;  Location: [facility] ENDOSCOPY;  Service: Endoscopy;  Laterality: N/A;  . EYE SURGERY Left     Allergies Augmentin [amoxicillin-pot clavulanate]  Social History Social History  Substance Use Topics  . Smoking status: Former Research scientist (life sciences)  . Smokeless tobacco: Never Used  . Alcohol use 0.0 oz/week     Comment: Rare drink    Review of Systems Constitutional: Negative for fever. Cardiovascular: Negative for chest pain. Respiratory: Negative for shortness of breath. Gastrointestinal:Positive for left lower quadrant pain, diarrhea, rectal bleeding Genitourinary: Negative for dysuria. Musculoskeletal: Negative for back pain. Skin: Negative for rash. Neurological: Negative for headaches, focal weakness or numbness.  10-point ROS otherwise negative.  ____________________________________________   PHYSICAL EXAM:  VITAL SIGNS: ED Triage Vitals  Enc Vitals Group     BP 04/14/16 0859 (!) 119/58     Pulse Rate 04/14/16 0859 64     Resp 04/14/16 0859 16     Temp 04/14/16 0859 98.9 F (37.2 C)     Temp Source 04/14/16 0859 Oral     SpO2 04/14/16 0859 94 %     Weight 04/14/16 0900 145 lb (65.8 kg)     Height 04/14/16 0900 5\' 3"  (1.6 m)     Head Circumference --      Peak Flow --      Pain Score 04/14/16 0900 7     Pain Loc --      Pain Edu? --      Excl. in [place]? --     Constitutional: Alert and oriented. Mild distress Eyes: Conjunctivae are normal. PERRL. Normal extraocular movements. ENT   Head: Normocephalic and atraumatic.   Nose: No congestion/rhinnorhea.   Mouth/Throat: Mucous membranes are moist.   Neck: No stridor. Cardiovascular: Normal rate, regular rhythm. No murmurs, rubs, or gallops. Respiratory: Normal respiratory effort without tachypnea nor retractions. Breath sounds are clear and  equal bilaterally. No wheezes/rales/rhonchi. Gastrointestinal: Left lower quadrant tenderness, no rebound or guarding. Normal bowel sounds. Rectal: No gross blood is present, no hemorrhoids Musculoskeletal: Nontender with normal range of motion in all extremities. No lower extremity tenderness nor edema. Neurologic:  Normal speech and language. No gross focal neurologic deficits are appreciated.  Skin:  Skin is warm, dry and intact. No rash noted. Psychiatric: Mood and  affect are normal. Speech and behavior are normal.  ____________________________________________  EKG: Interpreted by me. Sinus rhythm rate 86 bpm, normal. We'll, normal QRS, normal QT, normal axis.  ____________________________________________  ED COURSE:  Pertinent labs & imaging results that were available during my care of the patient were reviewed by me and considered in my medical decision making (see chart for details). Clinical Course  Patient resents to ER with rectal bleeding of uncertain etiology. We will assess with labs and imaging.  Procedures ____________________________________________   LABS (pertinent positives/negatives)  Labs Reviewed  COMPREHENSIVE METABOLIC PANEL - Abnormal; Notable for the following:       Result Value   Potassium 3.4 (*)    Glucose, Bld 149 (*)    AST 48 (*)    Total Bilirubin <0.1 (*)    All other components within normal limits  CBC WITH DIFFERENTIAL/PLATELET - Abnormal; Notable for the following:    WBC 11.6 (*)    Neutro Abs 10.0 (*)    Lymphs Abs 0.7 (*)    All other components within normal limits  LIPASE, BLOOD  TROPONIN I  PROTIME-INR  CBC WITH DIFFERENTIAL/PLATELET  URINALYSIS COMPLETEWITH MICROSCOPIC ([facility] ONLY)  TYPE AND SCREEN    RADIOLOGY Images were viewed by me  CT of abdomen and pelvis with contrast IMPRESSION: Colitis involving the descending and sigmoid colon without evidence of bowel perforation or abscess.  ____________________________________________  FINAL ASSESSMENT AND PLAN  Abdominal pain, rectal bleeding, Colitis  Plan: Patient with labs and imaging as dictated above. Patient be started on Cipro and Flagyl, will discuss with the hospitalist for observation due to the amount of bleeding that she had earlier. No further bleeding at this time. She's also received a saline infusion.  Earleen Newport, MD   Note: This dictation was prepared with Dragon dictation. Any transcriptional errors  that result from this process are unintentional    Earleen Newport, MD 04/14/16 1223

## 2016-04-14 NOTE — H&P (Addendum)
[facility] [place] NAME: Savannah Cox    MR#:  [id number]  DATE OF BIRTH:  [birth year]  DATE OF ADMISSION:  04/14/2016  PRIMARY CARE PHYSICIAN: Einar Pheasant, MD   REQUESTING/REFERRING PHYSICIAN: Earleen Newport, MD  CHIEF COMPLAINT:   Chief Complaint  Patient presents with  . Abdominal Pain  . Rectal Bleeding   Lower abdominal pain, diarrhea and bloody stool since last night HISTORY OF PRESENT ILLNESS:  Yasameen Faur  is a 70 y.o. female with a known history of Hypertension, CKD, anemia and hyperlipidemia. The patient complains of lower abdominal pain, which is in the lower left side, cramp and diarrhea since last night. She also reported that she had a one episode of bloody stool last night and 5-6 episodes since 5:00 this morning. CAT scan of abdomen show colitis. The patient is treated with Cipro and Flagyl in the ED. The patient had colonoscopy with polyp removed 1 months ago.  PAST MEDICAL HISTORY:   Past Medical History:  Diagnosis Date  . Allergic state   . Anemia   . Chronic kidney disease   . Hypercholesterolemia   . Hypertension   . Hypothyroidism   . Osteoporosis, postmenopausal   . Shingles     PAST SURGICAL HISTORY:   Past Surgical History:  Procedure Laterality Date  . BREAST BIOPSY     left, benign  . COLONOSCOPY WITH PROPOFOL N/A 01/28/2016   Procedure: COLONOSCOPY WITH PROPOFOL;  Surgeon: Lollie Sails, MD;  Location: [facility] ENDOSCOPY;  Service: Endoscopy;  Laterality: N/A;  . EYE SURGERY Left     SOCIAL HISTORY:   Social History  Substance Use Topics  . Smoking status: Former Research scientist (life sciences)  . Smokeless tobacco: Never Used  . Alcohol use 0.0 oz/week     Comment: Rare drink    FAMILY HISTORY:   Family History  Problem Relation Age of Onset  . Heart disease Father     died age 35 - myocardial infarction  . Hypercholesterolemia Mother   . Arthritis Mother   . Kidney cancer      aunt  .  Breast cancer Maternal Aunt   . Colon cancer Neg Hx     DRUG ALLERGIES:   Allergies  Allergen Reactions  . Augmentin [Amoxicillin-Pot Clavulanate] Rash    REVIEW OF SYSTEMS:   Review of Systems  Constitutional: Positive for malaise/fatigue. Negative for chills and fever.  HENT: Negative for congestion.   Eyes: Negative for blurred vision and double vision.  Respiratory: Negative for cough and shortness of breath.   Cardiovascular: Negative for chest pain, palpitations and leg swelling.  Gastrointestinal: Positive for abdominal pain, blood in stool and diarrhea. Negative for nausea and vomiting.  Genitourinary: Negative for dysuria and hematuria.  Musculoskeletal: Negative for joint pain.  Neurological: Negative for dizziness, focal weakness, loss of consciousness and headaches.  Psychiatric/Behavioral: Negative for depression. The patient is not nervous/anxious.     MEDICATIONS AT HOME:   Prior to Admission medications   Medication Sig Start Date End Date Taking? Authorizing Provider  levothyroxine (SYNTHROID, LEVOTHROID) 50 MCG tablet TAKE 1 TABLET (50 MCG TOTAL) BY MOUTH DAILY. 04/07/16  Yes Einar Pheasant, MD  lisinopril-hydrochlorothiazide (PRINZIDE,ZESTORETIC) 10-12.5 MG tablet Take 1 tablet by mouth daily. 10/19/15  Yes Einar Pheasant, MD  loratadine (CLARITIN) 10 MG tablet Take 10 mg by mouth daily.   Yes Historical Provider, MD  Multiple Vitamins-Minerals (CENTRUM SILVER ULTRA WOMENS PO) Take by mouth.  Yes Historical Provider, MD  omeprazole (PRILOSEC) 20 MG capsule TAKE 1 CAPSULE (20 MG TOTAL) BY MOUTH DAILY. 02/04/16  Yes Einar Pheasant, MD  rosuvastatin (CRESTOR) 5 MG tablet TAKE 1 TABLET BY MOUTH ONCE A DAY 09/24/15  Yes Einar Pheasant, MD  sertraline (ZOLOFT) 50 MG tablet Take 1 tablet (50 mg total) by mouth daily. 06/28/15  Yes Einar Pheasant, MD      VITAL SIGNS:  Blood pressure 110/76, pulse 86, temperature 98.9 F (37.2 C), temperature source Oral, resp. rate  15, height 5\' 3"  (1.6 m), weight 145 lb (65.8 kg), SpO2 92 %.  PHYSICAL EXAMINATION:  Physical Exam  Constitutional: She is oriented to person, place, and time and well-developed, well-nourished, and in no distress.  HENT:  Mouth/Throat: Oropharynx is clear and moist.  Eyes: Conjunctivae and EOM are normal. Pupils are equal, round, and reactive to light.  Neck: Normal range of motion. Neck supple. No JVD present. No tracheal deviation present.  Cardiovascular: Normal rate, regular rhythm and normal heart sounds.  Exam reveals no gallop.   No murmur heard. Pulmonary/Chest: Effort normal and breath sounds normal. No respiratory distress. She has no wheezes. She has no rales.  Abdominal: Soft. Bowel sounds are normal. She exhibits no distension. There is tenderness.  Left lower quadrant tenderness  Musculoskeletal: Normal range of motion. She exhibits no edema or tenderness.  Neurological: She is alert and oriented to person, place, and time. No cranial nerve deficit.  Skin: No rash noted. No erythema.  Psychiatric: Affect and judgment normal.    LABORATORY PANEL:   CBC  Recent Labs Lab 04/14/16 1052  WBC 11.6*  HGB 14.3  HCT 41.9  PLT 194   ------------------------------------------------------------------------------------------------------------------  Chemistries   Recent Labs Lab 04/14/16 1008  NA 136  K 3.4*  CL 101  CO2 23  GLUCOSE 149*  BUN 18  CREATININE 0.71  CALCIUM 9.5  AST 48*  ALT 30  ALKPHOS 69  BILITOT <0.1*   ------------------------------------------------------------------------------------------------------------------  Cardiac Enzymes  Recent Labs Lab 04/14/16 1008  TROPONINI <0.03   ------------------------------------------------------------------------------------------------------------------  RADIOLOGY:  Ct Abdomen Pelvis W Contrast  Result Date: 04/14/2016 CLINICAL DATA:  Left lower quadrant abdominal pain radiating into back.  EXAM: CT ABDOMEN AND PELVIS WITH CONTRAST TECHNIQUE: Multidetector CT imaging of the abdomen and pelvis was performed using the standard protocol following bolus administration of intravenous contrast. CONTRAST:  179mL ISOVUE-300 IOPAMIDOL (ISOVUE-300) INJECTION 61% COMPARISON:  04/19/2014 FINDINGS: Lower chest: Parenchymal scarring noted at both lung bases. Hepatobiliary: Mild and diffuse hepatic steatosis noted. No overt cirrhotic changes. No focal lesions or biliary obstruction. The gallbladder is unremarkable. Pancreas: Unremarkable. No pancreatic ductal dilatation or surrounding inflammatory changes. Spleen: Normal in size without focal abnormality. Adrenals/Urinary Tract: Stable small benign renal cysts. No hydronephrosis, renal calculi or masses. Adrenal glands are normal. The bladder is decompressed. Stomach/Bowel: Bowel shows evidence of circumferential thickening involving the descending and sigmoid colon. Other bowel loops are within normal limits. There is trace inflammatory change in the fat adjacent to the sigmoid colon without evidence of bowel perforation or focal abscess. No free air identified. Vascular/Lymphatic: No enlarged lymph nodes are seen. The abdominal aorta shows calcified plaque distally without evidence of aneurysm. Reproductive: Uterus and adnexal regions are unremarkable by CT. Other: No abdominal wall hernia or abnormality. No abdominopelvic ascites. Musculoskeletal: No acute or significant osseous findings. IMPRESSION: Colitis involving the descending and sigmoid colon without evidence of bowel perforation or abscess. Electronically Signed   By: Aletta Edouard  M.D.   On: 04/14/2016 12:12      IMPRESSION AND PLAN:   Acute colitis with GI bleeding. The patient will be placed for observation. Clear liquid diet.IV fluid support. Continue Cipro and Flagyl by mouth, follow-up hemoglobin every 8 hours. GI consult.   Hypokalemia, give potassium supplement and follow-up  BMP. Hypertension. Hold hypertension medication due to low side blood pressure.  All the records are reviewed and case discussed with ED provider. Management plans discussed with the patient, Her husband and they are in agreement.  CODE STATUS: Full code  TOTAL TIME TAKING CARE OF THIS PATIENT: 48 minutes.    Demetrios Loll M.D on 04/14/2016 at 12:48 PM  Between 7am to 6pm - Pager - [phone]  After 6pm go to [email or web address] - Proofreader  [facility]  Hospitalists  Office  [phone]  CC: Primary care physician; Einar Pheasant, MD   Note: This dictation was prepared with Dragon dictation along with smaller phrase technology. Any transcriptional errors that result from this process are unintentional.

## 2016-04-14 NOTE — ED Notes (Signed)
Pt reports LLQ pain since 0100, radiating to back. Pt reports red in toilet as well as very dark diarrhea. Pt states 5-6 episodes, accompanied by constant abdominal pain. NAD noted.

## 2016-04-15 LAB — CBC
HCT: 38 % (ref 35.0–47.0)
Hemoglobin: 13.2 g/dL (ref 12.0–16.0)
MCH: 33.2 pg (ref 26.0–34.0)
MCHC: 34.7 g/dL (ref 32.0–36.0)
MCV: 95.9 fL (ref 80.0–100.0)
PLATELETS: 185 10*3/uL (ref 150–440)
RBC: 3.96 MIL/uL (ref 3.80–5.20)
RDW: 13.9 % (ref 11.5–14.5)
WBC: 11.4 10*3/uL — AB (ref 3.6–11.0)

## 2016-04-15 LAB — BASIC METABOLIC PANEL
Anion gap: 5 (ref 5–15)
BUN: 9 mg/dL (ref 6–20)
CALCIUM: 8.4 mg/dL — AB (ref 8.9–10.3)
CO2: 26 mmol/L (ref 22–32)
CREATININE: 0.77 mg/dL (ref 0.44–1.00)
Chloride: 109 mmol/L (ref 101–111)
GFR calc non Af Amer: >60 mL/min (ref >60)
Glucose, Bld: 128 mg/dL — ABNORMAL HIGH (ref 65–99)
Potassium: 3.4 mmol/L — ABNORMAL LOW (ref 3.5–5.1)
SODIUM: 140 mmol/L (ref 135–145)

## 2016-04-15 MED ORDER — PANTOPRAZOLE SODIUM 40 MG PO TBEC
40.0000 mg | DELAYED_RELEASE_TABLET | Freq: Two times a day (BID) | ORAL | Status: DC
  Administered 2016-04-15 – 2016-04-17 (×4): 40 mg via ORAL
  Filled 2016-04-15 (×4): qty 1

## 2016-04-15 MED ORDER — SODIUM CHLORIDE 0.9 % IV BOLUS (SEPSIS)
1000.0000 mL | INTRAVENOUS | Status: DC | PRN
  Administered 2016-04-15: 1000 mL via INTRAVENOUS
  Filled 2016-04-15: qty 1000

## 2016-04-15 MED ORDER — KCL IN DEXTROSE-NACL 20-5-0.45 MEQ/L-%-% IV SOLN
INTRAVENOUS | Status: DC
  Administered 2016-04-15: 1000 mL via INTRAVENOUS
  Administered 2016-04-16: 02:00:00 via INTRAVENOUS
  Filled 2016-04-15 (×6): qty 1000

## 2016-04-15 NOTE — Consult Note (Signed)
Consultation  Referring Provider:     No ref. provider found Primary Care Physician:  Einar Pheasant, MD Primary Gastroenterologist:  Dr.Sharlett Lienemann        Reason for Consultation:     Blood in her stool with hematochezia with colitis on CT   Date of Admission:  04/14/2016 Date of Consultation:  04/15/2016         HPI:   Savannah Cox is a 70 y.o. female with a recent h/o blood in her stool with left sided colitis noted on CT.She underwent a colonoscopy one month ago that revealed a polyp and diverticulosis.There is no bleeding at this time and she feels comfortable.  Past Medical History:  Diagnosis Date  . Allergic state   . Anemia   . Chronic kidney disease   . Hypercholesterolemia   . Hypertension   . Hypothyroidism   . Osteoporosis, postmenopausal   . Shingles     Past Surgical History:  Procedure Laterality Date  . BREAST BIOPSY     left, benign  . COLONOSCOPY WITH PROPOFOL N/A 01/28/2016   Procedure: COLONOSCOPY WITH PROPOFOL;  Surgeon: Lollie Sails, MD;  Location: [facility] ENDOSCOPY;  Service: Endoscopy;  Laterality: N/A;  . EYE SURGERY Left     Prior to Admission medications   Medication Sig Start Date End Date Taking? Authorizing Provider  levothyroxine (SYNTHROID, LEVOTHROID) 50 MCG tablet TAKE 1 TABLET (50 MCG TOTAL) BY MOUTH DAILY. 04/07/16  Yes Einar Pheasant, MD  lisinopril-hydrochlorothiazide (PRINZIDE,ZESTORETIC) 10-12.5 MG tablet Take 1 tablet by mouth daily. 10/19/15  Yes Einar Pheasant, MD  loratadine (CLARITIN) 10 MG tablet Take 10 mg by mouth daily.   Yes Historical Provider, MD  Multiple Vitamins-Minerals (CENTRUM SILVER ULTRA WOMENS PO) Take by mouth.   Yes Historical Provider, MD  omeprazole (PRILOSEC) 20 MG capsule TAKE 1 CAPSULE (20 MG TOTAL) BY MOUTH DAILY. 02/04/16  Yes Einar Pheasant, MD  rosuvastatin (CRESTOR) 5 MG tablet TAKE 1 TABLET BY MOUTH ONCE A DAY 09/24/15  Yes Einar Pheasant, MD  sertraline (ZOLOFT) 50 MG tablet Take 1 tablet (50 mg total)  by mouth daily. 06/28/15  Yes Einar Pheasant, MD    Family History  Problem Relation Age of Onset  . Heart disease Father     died age 105 - myocardial infarction  . Hypercholesterolemia Mother   . Arthritis Mother   . Kidney cancer      aunt  . Breast cancer Maternal Aunt   . Colon cancer Neg Hx      Social History  Substance Use Topics  . Smoking status: Former Research scientist (life sciences)  . Smokeless tobacco: Never Used  . Alcohol use 0.0 oz/week     Comment: Rare drink    Allergies as of 04/14/2016 - Review Complete 04/14/2016  Allergen Reaction Noted  . Augmentin [amoxicillin-pot clavulanate] Rash 04/29/2012    Review of Systems:    All systems reviewed and negative except where noted in HPI.   Physical Exam:  Vital signs in last 24 hours: Temp:  [98.2 F (36.8 C)-98.9 F (37.2 C)] 98.9 F (37.2 C) (10/31 1422) Pulse Rate:  [76-90] 90 (10/31 1422) Resp:  [15-21] 15 (10/31 1422) BP: (93-107)/(43-48) 102/43 (10/31 1422) SpO2:  [91 %-94 %] 91 % (10/31 1422) Last BM Date: 04/14/16 General:   Pleasant, cooperative in NAD Head:  Normocephalic and atraumatic. Eyes:   No icterus.   Conjunctiva pink. PERRLA. Ears:  Normal auditory acuity. Neck:  Supple; no masses or thyroidomegaly Lungs: Respirations even  and unlabored. Lungs clear to auscultation bilaterally.   No wheezes, crackles, or rhonchi.  Heart:  Regular rate and rhythm;  Without murmur, clicks, rubs or gallops Abdomen:  Soft, nondistended, nontender. Normal bowel sounds. No appreciable masses or hepatomegaly.  No rebound or guarding.  Rectal:  Not performed. Msk:  Symmetrical without gross deformities.  Strength - normal  Extremities:  Without edema, cyanosis or clubbing. Neurologic:  Alert and oriented x3;  grossly normal neurologically. Skin:  Intact without significant lesions or rashes. Cervical Nodes:  No significant cervical adenopathy. Psych:  Alert and cooperative. Normal affect.  LAB RESULTS:  Recent Labs   04/14/16 1052 04/14/16 1407 04/14/16 2153 04/15/16 0607  WBC 11.6*  --   --  11.4*  HGB 14.3 13.3 13.0 13.2  HCT 41.9  --   --  38.0  PLT 194  --   --  185   BMET  Recent Labs  04/14/16 1008 04/15/16 0607  NA 136 140  K 3.4* 3.4*  CL 101 109  CO2 23 26  GLUCOSE 149* 128*  BUN 18 9  CREATININE 0.71 0.77  CALCIUM 9.5 8.4*   LFT  Recent Labs  04/14/16 1008  PROT 7.2  ALBUMIN 4.2  AST 48*  ALT 30  ALKPHOS 69  BILITOT <0.1*   PT/INR  Recent Labs  04/14/16 1052  LABPROT 12.8  INR 0.96    STUDIES: Ct Abdomen Pelvis W Contrast  Result Date: 04/14/2016 CLINICAL DATA:  Left lower quadrant abdominal pain radiating into back. EXAM: CT ABDOMEN AND PELVIS WITH CONTRAST TECHNIQUE: Multidetector CT imaging of the abdomen and pelvis was performed using the standard protocol following bolus administration of intravenous contrast. CONTRAST:  138mL ISOVUE-300 IOPAMIDOL (ISOVUE-300) INJECTION 61% COMPARISON:  04/19/2014 FINDINGS: Lower chest: Parenchymal scarring noted at both lung bases. Hepatobiliary: Mild and diffuse hepatic steatosis noted. No overt cirrhotic changes. No focal lesions or biliary obstruction. The gallbladder is unremarkable. Pancreas: Unremarkable. No pancreatic ductal dilatation or surrounding inflammatory changes. Spleen: Normal in size without focal abnormality. Adrenals/Urinary Tract: Stable small benign renal cysts. No hydronephrosis, renal calculi or masses. Adrenal glands are normal. The bladder is decompressed. Stomach/Bowel: Bowel shows evidence of circumferential thickening involving the descending and sigmoid colon. Other bowel loops are within normal limits. There is trace inflammatory change in the fat adjacent to the sigmoid colon without evidence of bowel perforation or focal abscess. No free air identified. Vascular/Lymphatic: No enlarged lymph nodes are seen. The abdominal aorta shows calcified plaque distally without evidence of aneurysm.  Reproductive: Uterus and adnexal regions are unremarkable by CT. Other: No abdominal wall hernia or abnormality. No abdominopelvic ascites. Musculoskeletal: No acute or significant osseous findings. IMPRESSION: Colitis involving the descending and sigmoid colon without evidence of bowel perforation or abscess. Electronically Signed   By: Aletta Edouard M.D.   On: 04/14/2016 12:12      Impression / Plan:   Savannah Cox is a 70 y.o. y/o female with a h/o blood in her stool and left sided colitis. She is w/o bleeding at this time. Plan- Current supportive therapy with slow advancement of diet.Colonoscopy at a later date.   Thank you for involving me in the care of this patient.      LOS: 0 days   Drinda Butts, MD  04/15/2016, 5:30 PM   Note: This dictation was prepared with Dragon dictation along with smaller phrase technology. Any transcriptional errors that result from this process are unintentional.

## 2016-04-15 NOTE — Progress Notes (Signed)
Pt was alert and resting comfortably. Husband was in room.  Pt anticipating discharge Wed. CH offered prayer.   04/15/16 1050  Clinical Encounter Type  Visited With Patient and family together  Visit Type Initial  Referral From Nurse  Spiritual Encounters  Spiritual Needs Prayer;Emotional  Stress Factors  Patient Stress Factors None identified  Family Stress Factors None identified

## 2016-04-15 NOTE — Progress Notes (Signed)
[facility] at [place] NAME: Savannah Cox    MR#:  [id number]  DATE OF BIRTH:  [birth year]  SUBJECTIVE:  CHIEF COMPLAINT:   Chief Complaint  Patient presents with  . Abdominal Pain  . Rectal Bleeding  The patient is a 70 year old Caucasian female with past medical history significant for history of CK D, hyperlipidemia, hypertension, who presents to the hospital with complaints of lower abdominal pain mostly in the left side, cramping, diarrhea, bloody stool. On arrival to the hospital patient had CT scan of her abdomen and pelvis revealing descending and sigmoid colon colitis. Her blood pressure was reported normal. On arrival to the hospital, however, was noted to be low intermittently. The patient complains of headache  Review of Systems  Constitutional: Negative for chills, fever and weight loss.  HENT: Negative for congestion.   Eyes: Negative for blurred vision and double vision.  Respiratory: Negative for cough, sputum production, shortness of breath and wheezing.   Cardiovascular: Negative for chest pain, palpitations, orthopnea, leg swelling and PND.  Gastrointestinal: Positive for abdominal pain, blood in stool and diarrhea. Negative for constipation, nausea and vomiting.  Genitourinary: Negative for dysuria, frequency, hematuria and urgency.  Musculoskeletal: Negative for falls.  Neurological: Positive for headaches. Negative for dizziness, tremors and focal weakness.  Endo/Heme/Allergies: Does not bruise/bleed easily.  Psychiatric/Behavioral: Negative for depression. The patient does not have insomnia.     VITAL SIGNS: Blood pressure (!) 93/46, pulse 76, temperature 98.2 F (36.8 C), temperature source Oral, resp. rate (!) 21, height 5\' 3"  (1.6 m), weight 65.8 kg (145 lb), SpO2 91 %.  PHYSICAL EXAMINATION:   GENERAL:  70 y.o.-year-old patient lying in the bed with no acute distress.  EYES: Pupils equal, round, reactive to  light and accommodation. No scleral icterus. Extraocular muscles intact.  HEENT: Head atraumatic, normocephalic. Oropharynx and nasopharynx clear.  NECK:  Supple, no jugular venous distention. No thyroid enlargement, no tenderness.  LUNGS: Normal breath sounds bilaterally, no wheezing, rales,rhonchi or crepitation. No use of accessory muscles of respiration.  CARDIOVASCULAR: S1, S2 normal. No murmurs, rubs, or gallops.  ABDOMEN: Soft, discomfort in left side of abdomen, but no rebound or guarding was noted, nondistended. Bowel sounds present. No organomegaly or mass.  EXTREMITIES: No pedal edema, cyanosis, or clubbing.  NEUROLOGIC: Cranial nerves II through XII are intact. Muscle strength 5/5 in all extremities. Sensation intact. Gait not checked.  PSYCHIATRIC: The patient is alert and oriented x 3.  SKIN: No obvious rash, lesion, or ulcer.   ORDERS/RESULTS REVIEWED:   CBC  Recent Labs Lab 04/14/16 1052 04/14/16 1407 04/14/16 2153 04/15/16 0607  WBC 11.6*  --   --  11.4*  HGB 14.3 13.3 13.0 13.2  HCT 41.9  --   --  38.0  PLT 194  --   --  185  MCV 95.4  --   --  95.9  MCH 32.5  --   --  33.2  MCHC 34.1  --   --  34.7  RDW 13.6  --   --  13.9  LYMPHSABS 0.7*  --   --   --   MONOABS 0.9  --   --   --   EOSABS 0.0  --   --   --   BASOSABS 0.0  --   --   --    ------------------------------------------------------------------------------------------------------------------  Chemistries   Recent Labs Lab 04/14/16 1008 04/15/16 0607  NA 136 140  K 3.4*  3.4*  CL 101 109  CO2 23 26  GLUCOSE 149* 128*  BUN 18 9  CREATININE 0.71 0.77  CALCIUM 9.5 8.4*  AST 48*  --   ALT 30  --   ALKPHOS 69  --   BILITOT <0.1*  --    ------------------------------------------------------------------------------------------------------------------ estimated creatinine clearance is 61.4 mL/min (by C-G formula based on SCr of 0.77  mg/dL). ------------------------------------------------------------------------------------------------------------------ No results for input(s): TSH, T4TOTAL, T3FREE, THYROIDAB in the last 72 hours.  Invalid input(s): FREET3  Cardiac Enzymes  Recent Labs Lab 04/14/16 1008  TROPONINI <0.03   ------------------------------------------------------------------------------------------------------------------ Invalid input(s): POCBNP ---------------------------------------------------------------------------------------------------------------  RADIOLOGY: Ct Abdomen Pelvis W Contrast  Result Date: 04/14/2016 CLINICAL DATA:  Left lower quadrant abdominal pain radiating into back. EXAM: CT ABDOMEN AND PELVIS WITH CONTRAST TECHNIQUE: Multidetector CT imaging of the abdomen and pelvis was performed using the standard protocol following bolus administration of intravenous contrast. CONTRAST:  178mL ISOVUE-300 IOPAMIDOL (ISOVUE-300) INJECTION 61% COMPARISON:  04/19/2014 FINDINGS: Lower chest: Parenchymal scarring noted at both lung bases. Hepatobiliary: Mild and diffuse hepatic steatosis noted. No overt cirrhotic changes. No focal lesions or biliary obstruction. The gallbladder is unremarkable. Pancreas: Unremarkable. No pancreatic ductal dilatation or surrounding inflammatory changes. Spleen: Normal in size without focal abnormality. Adrenals/Urinary Tract: Stable small benign renal cysts. No hydronephrosis, renal calculi or masses. Adrenal glands are normal. The bladder is decompressed. Stomach/Bowel: Bowel shows evidence of circumferential thickening involving the descending and sigmoid colon. Other bowel loops are within normal limits. There is trace inflammatory change in the fat adjacent to the sigmoid colon without evidence of bowel perforation or focal abscess. No free air identified. Vascular/Lymphatic: No enlarged lymph nodes are seen. The abdominal aorta shows calcified plaque distally without  evidence of aneurysm. Reproductive: Uterus and adnexal regions are unremarkable by CT. Other: No abdominal wall hernia or abnormality. No abdominopelvic ascites. Musculoskeletal: No acute or significant osseous findings. IMPRESSION: Colitis involving the descending and sigmoid colon without evidence of bowel perforation or abscess. Electronically Signed   By: Aletta Edouard M.D.   On: 04/14/2016 12:12    EKG:  Orders placed or performed in visit on 12/31/12  . EKG 12-Lead    ASSESSMENT AND PLAN:  Active Problems:   GIB (gastrointestinal bleeding)  #1. Acute colitis, questionable ischemic, continue supportive therapy with IV fluids, antibiotics, following hemoglobin level closely #2. Acute posthemorrhagic anemia, transfuse patient as needed., Hemoglobin level remains stable at present #3. Hypokalemia, supplement intravenously #4. Diarrhea, questionable infectious, continue antibiotic therapy, get stool cultures, if diarrhea recurs #5. Headache, continue Tylenol, change IV fluids to D5 water solution #6. Hypotension, IV fluid boluses needed, ? due to diarrheal stool at home   Management plans discussed with the patient, family and they are in agreement.   DRUG ALLERGIES:  Allergies  Allergen Reactions  . Augmentin [Amoxicillin-Pot Clavulanate] Rash    CODE STATUS:     Code Status Orders        Start     Ordered   04/14/16 1401  Full code  Continuous     04/14/16 1400    Code Status History    Date Active Date Inactive Code Status Order ID Comments User Context   This patient has a current code status but no historical code status.    Advance Directive Documentation   Flowsheet Row Most Recent Value  Type of Advance Directive  Healthcare Power of Attorney, Living will  Pre-existing out of facility DNR order (yellow form or pink MOST form)  No  data  "MOST" Form in Place?  No data      TOTAL TIME TAKING CARE OF THIS PATIENT: 40 minutes.    Theodoro Grist M.D on  04/15/2016 at 12:20 PM  Between 7am to 6pm - Pager - [phone]  After 6pm go to [email or web address] - password [email or web address]  Office  [phone]  CC: Primary care physician; Einar Pheasant, MD

## 2016-04-16 LAB — C DIFFICILE QUICK SCREEN W PCR REFLEX
C DIFFICILE (CDIFF) TOXIN: NEGATIVE
C DIFFICLE (CDIFF) ANTIGEN: NEGATIVE
C Diff interpretation: NOT DETECTED

## 2016-04-16 LAB — GASTROINTESTINAL PANEL BY PCR, STOOL (REPLACES STOOL CULTURE)
ASTROVIRUS: NOT DETECTED
Adenovirus F40/41: NOT DETECTED
Campylobacter species: NOT DETECTED
Cryptosporidium: NOT DETECTED
Cyclospora cayetanensis: NOT DETECTED
ENTEROAGGREGATIVE E COLI (EAEC): NOT DETECTED
ENTEROPATHOGENIC E COLI (EPEC): NOT DETECTED
ENTEROTOXIGENIC E COLI (ETEC): NOT DETECTED
Entamoeba histolytica: NOT DETECTED
GIARDIA LAMBLIA: NOT DETECTED
NOROVIRUS GI/GII: NOT DETECTED
Plesimonas shigelloides: NOT DETECTED
ROTAVIRUS A: NOT DETECTED
SALMONELLA SPECIES: NOT DETECTED
SHIGA LIKE TOXIN PRODUCING E COLI (STEC): NOT DETECTED
SHIGELLA/ENTEROINVASIVE E COLI (EIEC): NOT DETECTED
Sapovirus (I, II, IV, and V): NOT DETECTED
Vibrio cholerae: NOT DETECTED
Vibrio species: NOT DETECTED
Yersinia enterocolitica: NOT DETECTED

## 2016-04-16 LAB — HEMOGLOBIN: HEMOGLOBIN: 11.9 g/dL — AB (ref 12.0–16.0)

## 2016-04-16 NOTE — Progress Notes (Signed)
[facility] at [place] NAME: Savannah Cox    MR#:  [id number]  DATE OF BIRTH:  07-04-1945  SUBJECTIVE:  CHIEF COMPLAINT:   Chief Complaint  Patient presents with  . Abdominal Pain  . Rectal Bleeding  The patient is a 70 year old Caucasian female with past medical history significant for history of CK D, hyperlipidemia, hypertension, who presents to the hospital with complaints of lower abdominal pain mostly in the left side, cramping, diarrhea, bloody stool. On arrival to the hospital patient had CT scan of her abdomen and pelvis revealing descending and sigmoid colon colitis. No more bleeding, patient wants to eat more, pain is improving. Some diarrheal stool earlier today, little amount   Review of Systems  Constitutional: Negative for chills, fever and weight loss.  HENT: Negative for congestion.   Eyes: Negative for blurred vision and double vision.  Respiratory: Negative for cough, sputum production, shortness of breath and wheezing.   Cardiovascular: Negative for chest pain, palpitations, orthopnea, leg swelling and PND.  Gastrointestinal: Positive for abdominal pain, blood in stool and diarrhea. Negative for constipation, nausea and vomiting.  Genitourinary: Negative for dysuria, frequency, hematuria and urgency.  Musculoskeletal: Negative for falls.  Neurological: Positive for headaches. Negative for dizziness, tremors and focal weakness.  Endo/Heme/Allergies: Does not bruise/bleed easily.  Psychiatric/Behavioral: Negative for depression. The patient does not have insomnia.     VITAL SIGNS: Blood pressure (!) 111/55, pulse 76, temperature 98.6 F (37 C), temperature source Oral, resp. rate 19, height 5\' 3"  (1.6 m), weight 65.8 kg (145 lb), SpO2 95 %.  PHYSICAL EXAMINATION:   GENERAL:  70 y.o.-year-old patient lying in the bed with no acute distress.  EYES: Pupils equal, round, reactive to light and accommodation. No scleral  icterus. Extraocular muscles intact.  HEENT: Head atraumatic, normocephalic. Oropharynx and nasopharynx clear.  NECK:  Supple, no jugular venous distention. No thyroid enlargement, no tenderness.  LUNGS: Normal breath sounds bilaterally, no wheezing, rales,rhonchi or crepitation. No use of accessory muscles of respiration.  CARDIOVASCULAR: S1, S2 normal. No murmurs, rubs, or gallops.  ABDOMEN: Soft, mild discomfort in left side of abdomen, but no rebound or guarding was noted, nondistended. Bowel sounds present. No organomegaly or mass.  EXTREMITIES: No pedal edema, cyanosis, or clubbing.  NEUROLOGIC: Cranial nerves II through XII are intact. Muscle strength 5/5 in all extremities. Sensation intact. Gait not checked.  PSYCHIATRIC: The patient is alert and oriented x 3.  SKIN: No obvious rash, lesion, or ulcer.   ORDERS/RESULTS REVIEWED:   CBC  Recent Labs Lab 04/14/16 1052 04/14/16 1407 04/14/16 2153 04/15/16 0607 04/16/16 0439  WBC 11.6*  --   --  11.4*  --   HGB 14.3 13.3 13.0 13.2 11.9*  HCT 41.9  --   --  38.0  --   PLT 194  --   --  185  --   MCV 95.4  --   --  95.9  --   MCH 32.5  --   --  33.2  --   MCHC 34.1  --   --  34.7  --   RDW 13.6  --   --  13.9  --   LYMPHSABS 0.7*  --   --   --   --   MONOABS 0.9  --   --   --   --   EOSABS 0.0  --   --   --   --   BASOSABS 0.0  --   --   --   --    ------------------------------------------------------------------------------------------------------------------  Chemistries   Recent Labs Lab 04/14/16 1008 04/15/16 0607  NA 136 140  K 3.4* 3.4*  CL 101 109  CO2 23 26  GLUCOSE 149* 128*  BUN 18 9  CREATININE 0.71 0.77  CALCIUM 9.5 8.4*  AST 48*  --   ALT 30  --   ALKPHOS 69  --   BILITOT <0.1*  --    ------------------------------------------------------------------------------------------------------------------ estimated creatinine clearance is 61.4 mL/min (by C-G formula based on SCr of 0.77  mg/dL). ------------------------------------------------------------------------------------------------------------------ No results for input(s): TSH, T4TOTAL, T3FREE, THYROIDAB in the last 72 hours.  Invalid input(s): FREET3  Cardiac Enzymes  Recent Labs Lab 04/14/16 1008  TROPONINI <0.03   ------------------------------------------------------------------------------------------------------------------ Invalid input(s): POCBNP ---------------------------------------------------------------------------------------------------------------  RADIOLOGY: No results found.  EKG:  Orders placed or performed in visit on 12/31/12  . EKG 12-Lead    ASSESSMENT AND PLAN:  Active Problems:   GIB (gastrointestinal bleeding)  #1. Acute colitis, questionable ischemic Versus infectious, continue supportive therapy with IV fluids, antibiotics, clinically improved, advance diet to full liquids, follow closely, possible discharge tomorrow #2. Acute posthemorrhagic anemia, transfuse patient as needed., Hemoglobin level remains stable at present #3. Hypokalemia, supplementing intravenously, recheck in the morning #4. Diarrhea, questionable infectious, continue antibiotic therapy, get stool cultures, including C. difficile #5. Headache, continue Tylenol,  D5 water solution in IV fluids  #6. Hypotension,resolved with IV fluid administration  Management plans discussed with the patient, family and they are in agreement.   DRUG ALLERGIES:  Allergies  Allergen Reactions  . Augmentin [Amoxicillin-Pot Clavulanate] Rash    CODE STATUS:     Code Status Orders        Start     Ordered   04/14/16 1401  Full code  Continuous     04/14/16 1400    Code Status History    Date Active Date Inactive Code Status Order ID Comments User Context   This patient has a current code status but no historical code status.    Advance Directive Documentation   Flowsheet Row Most Recent Value  Type of  Advance Directive  Healthcare Power of Attorney, Living will  Pre-existing out of facility DNR order (yellow form or pink MOST form)  No data  "MOST" Form in Place?  No data      TOTAL TIME TAKING CARE OF THIS PATIENT: 30 minutes.  Discussed with patient's husband, all questions were answered, they voiced understanding  Rayleigh Gillyard M.D on 04/16/2016 at 12:52 PM  Between 7am to 6pm - Pager - [phone]  After 6pm go to [email or web address] - password [email or web address] [facility]  Office  [phone]  CC: Primary care physician; Einar Pheasant, MD

## 2016-04-17 ENCOUNTER — Telehealth: Payer: Self-pay | Admitting: Internal Medicine

## 2016-04-17 DIAGNOSIS — R197 Diarrhea, unspecified: Secondary | ICD-10-CM

## 2016-04-17 DIAGNOSIS — E876 Hypokalemia: Secondary | ICD-10-CM

## 2016-04-17 DIAGNOSIS — I959 Hypotension, unspecified: Secondary | ICD-10-CM

## 2016-04-17 DIAGNOSIS — D62 Acute posthemorrhagic anemia: Secondary | ICD-10-CM

## 2016-04-17 DIAGNOSIS — K529 Noninfective gastroenteritis and colitis, unspecified: Secondary | ICD-10-CM

## 2016-04-17 DIAGNOSIS — R519 Headache, unspecified: Secondary | ICD-10-CM

## 2016-04-17 DIAGNOSIS — R51 Headache: Secondary | ICD-10-CM

## 2016-04-17 MED ORDER — METRONIDAZOLE 500 MG PO TABS: 500.0000 mg | ORAL_TABLET | Freq: Three times a day (TID) | ORAL | 0 refills | Status: DC

## 2016-04-17 MED ORDER — CIPROFLOXACIN HCL 500 MG PO TABS: 500.0000 mg | ORAL_TABLET | Freq: Two times a day (BID) | ORAL | 0 refills | Status: DC

## 2016-04-17 NOTE — Progress Notes (Signed)
Patient discharged to home as ordered. IV discontinued to right arm, IV site clean dry and intact. Patient denies patient denies pain at this time no acute distress noted. Patient is alert and oriented ambulates without assistance.

## 2016-04-17 NOTE — Plan of Care (Signed)
Problem: Bowel/Gastric: Goal: Will show no signs and symptoms of gastrointestinal bleeding Outcome: Progressing No acute bleeding at this time.

## 2016-04-17 NOTE — Telephone Encounter (Signed)
Please schedule patient.

## 2016-04-17 NOTE — Discharge Summary (Signed)
[facility] NAME: Savannah Cox    MR#:  [id number]  DATE OF BIRTH:  06-07-1946  DATE OF ADMISSION:  04/14/2016 ADMITTING PHYSICIAN: Demetrios Loll, MD  DATE OF DISCHARGE: 04/17/2016 12:58 PM  PRIMARY CARE PHYSICIAN: Einar Pheasant, MD     ADMISSION DIAGNOSIS:  Colitis [K52.9] Rectal bleeding [K62.5]  DISCHARGE DIAGNOSIS:  Principal Problem:   GIB (gastrointestinal bleeding) Active Problems:   Acute colitis   Hypotension   Acute posthemorrhagic anemia   Diarrhea   Hypokalemia   Headache   SECONDARY DIAGNOSIS:   Past Medical History:  Diagnosis Date  . Allergic state   . Anemia   . Chronic kidney disease   . Hypercholesterolemia   . Hypertension   . Hypothyroidism   . Osteoporosis, postmenopausal   . Shingles     .pro HOSPITAL COURSE:  The patient is a 70 year old Caucasian female with past medical history significant for history of CK D, hyperlipidemia, hypertension, who presented to the hospital with complaints of lower abdominal pain mostly in the left side, cramping, diarrhea, bloody stool. On arrival to the hospital patient had CT scan of her abdomen and pelvis revealing descending and sigmoid colon colitis. Her blood pressure was  low intermittently. Patient was initiated on ciprofloxacin and Flagyl intravenously, IV fluids, kept nothing by mouth, gastroenterologist consultation was obtained. Dr. Truman Hayward, gastroenterologist, saw patient in consultation and recommended to continue antibiotic therapy and follow up with primary gastroenterologist as outpatient. Patient clinically improved. Her hemoglobin level was followed periodically and did not show significant drop, patient was not transfused. Her diet was advanced to soft prior to discharge and she did well with it. Of note, stool was checked for C. difficile, enteropathogens and it was negative. The patient was felt to be stable to be discharged home. Discussion  by problem: #1. Acute colitis, suspected ischemic . The patient was advised to continue antibiotic therapy with ciprofloxacin and Flagyl, soft diet and follow up with gastroenterologist as outpatient. Her stool cultures were checked and it was negative for C. difficile or other enteropathogens.  #2. Acute posthemorrhagic anemia, the patient was not transfused. Hemoglobin level remained stable , it is recommended to follow hemoglobin level as outpatient and initiate the patient on iron supplementation as outpatient #3. Hypokalemia, supplemented intravenously, recheck as outpatient #4. Diarrhea, unlikely infectious, stool cultures were negative for any infection including C. difficile, it resolved with conservative therapy  #5. Headache, resolved with Tylenol #6. Hypotension,resolved with IV fluid administration  DISCHARGE CONDITIONS:   Stable  CONSULTS OBTAINED:  Treatment Team:  Drinda Butts, MD  DRUG ALLERGIES:   Allergies  Allergen Reactions  . Augmentin [Amoxicillin-Pot Clavulanate] Rash    DISCHARGE MEDICATIONS:   Discharge Medication List as of 04/17/2016 12:05 PM    START taking these medications   Details  ciprofloxacin (CIPRO) 500 MG tablet Take 1 tablet (500 mg total) by mouth 2 (two) times daily., Starting Thu 04/17/2016, Normal    metroNIDAZOLE (FLAGYL) 500 MG tablet Take 1 tablet (500 mg total) by mouth every 8 (eight) hours., Starting Thu 04/17/2016, Normal      CONTINUE these medications which have NOT CHANGED   Details  levothyroxine (SYNTHROID, LEVOTHROID) 50 MCG tablet TAKE 1 TABLET (50 MCG TOTAL) BY MOUTH DAILY., Starting Mon 04/07/2016, Normal    loratadine (CLARITIN) 10 MG tablet Take 10 mg by mouth daily., Historical Med    Multiple Vitamins-Minerals (CENTRUM SILVER ULTRA WOMENS PO) Take by mouth., Historical  Med    omeprazole (PRILOSEC) 20 MG capsule TAKE 1 CAPSULE (20 MG TOTAL) BY MOUTH DAILY., Normal    rosuvastatin (CRESTOR) 5 MG tablet TAKE 1 TABLET  BY MOUTH ONCE A DAY, Normal    sertraline (ZOLOFT) 50 MG tablet Take 1 tablet (50 mg total) by mouth daily., Starting Thu 06/28/2015, Normal      STOP taking these medications     lisinopril-hydrochlorothiazide (PRINZIDE,ZESTORETIC) 10-12.5 MG tablet          DISCHARGE INSTRUCTIONS:    The patient is to follow-up with primary care physician and primary gastroenterologist as outpatient  If you experience worsening of your admission symptoms, develop shortness of breath, life threatening emergency, suicidal or homicidal thoughts you must seek medical attention immediately by calling 911 or calling your MD immediately  if symptoms less severe.  You Must read complete instructions/literature along with all the possible adverse reactions/side effects for all the Medicines you take and that have been prescribed to you. Take any new Medicines after you have completely understood and accept all the possible adverse reactions/side effects.   Please note  You were cared for by a hospitalist during your hospital stay. If you have any questions about your discharge medications or the care you received while you were in the hospital after you are discharged, you can call the unit and asked to speak with the hospitalist on call if the hospitalist that took care of you is not available. Once you are discharged, your primary care physician will handle any further medical issues. Please note that NO REFILLS for any discharge medications will be authorized once you are discharged, as it is imperative that you return to your primary care physician (or establish a relationship with a primary care physician if you do not have one) for your aftercare needs so that they can reassess your need for medications and monitor your lab values.    Today   CHIEF COMPLAINT:   Chief Complaint  Patient presents with  . Abdominal Pain  . Rectal Bleeding    HISTORY OF PRESENT ILLNESS:  Savannah Cox  is a 70 y.o.  female with a known history of CK D, hyperlipidemia, hypertension, who presented to the hospital with complaints of lower abdominal pain mostly in the left side, cramping, diarrhea, bloody stool. On arrival to the hospital patient had CT scan of her abdomen and pelvis revealing descending and sigmoid colon colitis. Her blood pressure was  low intermittently. Patient was initiated on ciprofloxacin and Flagyl intravenously, IV fluids, kept nothing by mouth, gastroenterologist consultation was obtained. Dr. Truman Hayward, gastroenterologist, saw patient in consultation and recommended to continue antibiotic therapy and follow up with primary gastroenterologist as outpatient. Patient clinically improved. Her hemoglobin level was followed periodically and did not show significant drop, patient was not transfused. Her diet was advanced to soft prior to discharge and she did well with it. Of note, stool was checked for C. difficile, enteropathogens and it was negative. The patient was felt to be stable to be discharged home. Discussion by problem: #1. Acute colitis, suspected ischemic . The patient was advised to continue antibiotic therapy with ciprofloxacin and Flagyl, soft diet and follow up with gastroenterologist as outpatient. Her stool cultures were checked and it was negative for C. difficile or other enteropathogens.  #2. Acute posthemorrhagic anemia, the patient was not transfused. Hemoglobin level remained stable , it is recommended to follow hemoglobin level as outpatient and initiate the patient on iron supplementation as outpatient #3.  Hypokalemia, supplemented intravenously, recheck as outpatient #4. Diarrhea, unlikely infectious, stool cultures were negative for any infection including C. difficile, it resolved with conservative therapy  #5. Headache, resolved with Tylenol #6. Hypotension,resolved with IV fluid administration    VITAL SIGNS:  Blood pressure (!) 101/52, pulse 68, temperature 98.6 F (37  C), temperature source Oral, resp. rate 18, height 5\' 3"  (1.6 m), weight 65.8 kg (145 lb), SpO2 95 %.  I/O:   Intake/Output Summary (Last 24 hours) at 04/17/16 1310 Last data filed at 04/17/16 0900  Gross per 24 hour  Intake          6869.58 ml  Output             1400 ml  Net          5469.58 ml    PHYSICAL EXAMINATION:  GENERAL:  70 y.o.-year-old patient lying in the bed with no acute distress.  EYES: Pupils equal, round, reactive to light and accommodation. No scleral icterus. Extraocular muscles intact.  HEENT: Head atraumatic, normocephalic. Oropharynx and nasopharynx clear.  NECK:  Supple, no jugular venous distention. No thyroid enlargement, no tenderness.  LUNGS: Normal breath sounds bilaterally, no wheezing, rales,rhonchi or crepitation. No use of accessory muscles of respiration.  CARDIOVASCULAR: S1, S2 normal. No murmurs, rubs, or gallops.  ABDOMEN: Soft, non-tender, non-distended. Bowel sounds present. No organomegaly or mass.  EXTREMITIES: No pedal edema, cyanosis, or clubbing.  NEUROLOGIC: Cranial nerves II through XII are intact. Muscle strength 5/5 in all extremities. Sensation intact. Gait not checked.  PSYCHIATRIC: The patient is alert and oriented x 3.  SKIN: No obvious rash, lesion, or ulcer.   DATA REVIEW:   CBC  Recent Labs Lab 04/15/16 0607 04/16/16 0439  WBC 11.4*  --   HGB 13.2 11.9*  HCT 38.0  --   PLT 185  --     Chemistries   Recent Labs Lab 04/14/16 1008 04/15/16 0607  NA 136 140  K 3.4* 3.4*  CL 101 109  CO2 23 26  GLUCOSE 149* 128*  BUN 18 9  CREATININE 0.71 0.77  CALCIUM 9.5 8.4*  AST 48*  --   ALT 30  --   ALKPHOS 69  --   BILITOT <0.1*  --     Cardiac Enzymes  Recent Labs Lab 04/14/16 1008  TROPONINI <0.03    Microbiology Results  Results for orders placed or performed during the hospital encounter of 04/14/16  Gastrointestinal Panel by PCR , Stool     Status: None   Collection Time: 04/16/16 12:47 PM  Result  Value Ref Range Status   Campylobacter species NOT DETECTED NOT DETECTED Final   Plesimonas shigelloides NOT DETECTED NOT DETECTED Final   Salmonella species NOT DETECTED NOT DETECTED Final   Yersinia enterocolitica NOT DETECTED NOT DETECTED Final   Vibrio species NOT DETECTED NOT DETECTED Final   Vibrio cholerae NOT DETECTED NOT DETECTED Final   Enteroaggregative E coli (EAEC) NOT DETECTED NOT DETECTED Final   Enteropathogenic E coli (EPEC) NOT DETECTED NOT DETECTED Final   Enterotoxigenic E coli (ETEC) NOT DETECTED NOT DETECTED Final   Shiga like toxin producing E coli (STEC) NOT DETECTED NOT DETECTED Final   Shigella/Enteroinvasive E coli (EIEC) NOT DETECTED NOT DETECTED Final   Cryptosporidium NOT DETECTED NOT DETECTED Final   Cyclospora cayetanensis NOT DETECTED NOT DETECTED Final   Entamoeba histolytica NOT DETECTED NOT DETECTED Final   Giardia lamblia NOT DETECTED NOT DETECTED Final   Adenovirus F40/41 NOT DETECTED  NOT DETECTED Final   Astrovirus NOT DETECTED NOT DETECTED Final   Norovirus GI/GII NOT DETECTED NOT DETECTED Final   Rotavirus A NOT DETECTED NOT DETECTED Final   Sapovirus (I, II, IV, and V) NOT DETECTED NOT DETECTED Final  C difficile quick scan w PCR reflex     Status: None   Collection Time: 04/16/16 12:47 PM  Result Value Ref Range Status   C Diff antigen NEGATIVE NEGATIVE Final   C Diff toxin NEGATIVE NEGATIVE Final   C Diff interpretation No C. difficile detected.  Final    RADIOLOGY:  No results found.  EKG:   Orders placed or performed in visit on 12/31/12  . EKG 12-Lead      Management plans discussed with the patient, family and they are in agreement.  CODE STATUS:     Code Status Orders        Start     Ordered   04/14/16 1401  Full code  Continuous     04/14/16 1400    Code Status History    Date Active Date Inactive Code Status Order ID Comments User Context   This patient has a current code status but no historical code status.     Advance Directive Documentation   Flowsheet Row Most Recent Value  Type of Advance Directive  Healthcare Power of Attorney, Living will  Pre-existing out of facility DNR order (yellow form or pink MOST form)  No data  "MOST" Form in Place?  No data      TOTAL TIME TAKING CARE OF THIS PATIENT: 40 minutes.    Theodoro Grist M.D on 04/17/2016 at 1:11 PM  Between 7am to 6pm - Pager - [phone]  After 6pm go to [email or web address] - password [email or web address]  Office  [phone]  CC: Primary care physician; Einar Pheasant, MD

## 2016-04-17 NOTE — Telephone Encounter (Signed)
[facility] called and stated that pt needs a one week. She was admitted for GI bleed. Please advise where to schedule. Thank you!  Call [phone]

## 2016-04-18 NOTE — Telephone Encounter (Signed)
Attempted to reach patient to complete TCM, no answer, left a VM

## 2016-04-18 NOTE — Telephone Encounter (Signed)
Spoken to patient, she is feeling very well.  Patient is not having any pain but is still "Tender" Left Lower Abdominal region.Patient is to FU with GI [id number].  She was taken off Lisinopril due to her BP being too low. Patient was placed on Metronidazole 500mg  1 tab every 8 hours and Ciprofloxacin HCL 500mg  twice daily.  Patient is currently on Full Soft food diet and had liquid diet in hospital.  Patient has yet to have a normal bowel as of yet, due to the dieting.

## 2016-04-18 NOTE — Telephone Encounter (Signed)
Appt time for 11/7 was taken. Please advise as to where to schedule. Thank you!

## 2016-04-18 NOTE — Telephone Encounter (Signed)
Attempted to reach the patient, left a VM. thanks 

## 2016-04-18 NOTE — Telephone Encounter (Signed)
Pt called returning your call. Thank you!  Call pt @ [phone]

## 2016-04-18 NOTE — Telephone Encounter (Signed)
Please call patient at [phone]

## 2016-04-18 NOTE — Telephone Encounter (Signed)
I assume you have this pt on your list to call for hospital f/u call.  If not, she needs to be called.  Also, can schedule her on Tuesday.  Open spot Tuesday pm at 3:00 (04/22/16).

## 2016-04-21 NOTE — Telephone Encounter (Signed)
Scheduled thanks.

## 2016-04-21 NOTE — Telephone Encounter (Signed)
She said no, she was willing to come today if you had anything?

## 2016-04-21 NOTE — Telephone Encounter (Signed)
This patient needs a TCM follow up, original you had requested the 7th, that slot is full please advise another time. thanks

## 2016-04-21 NOTE — Telephone Encounter (Signed)
I can see her at 12:30 on 04/28/16.

## 2016-04-21 NOTE — Telephone Encounter (Signed)
Spoke with patient states that time works for her

## 2016-04-21 NOTE — Telephone Encounter (Signed)
You can put her in at 12:00 on 04/22/16.  Can this still be a TCM f/u visit?

## 2016-04-21 NOTE — Telephone Encounter (Signed)
Please advise,

## 2016-04-21 NOTE — Telephone Encounter (Signed)
I don't have anything on Thursday or Friday.  Can she get someone to cover just to come in for appt?

## 2016-04-21 NOTE — Telephone Encounter (Signed)
Spoke with the patient, she has to work the [employer] all day.  She is available on Thursday or Friday? And yes it will be a TCM. thanks

## 2016-04-28 ENCOUNTER — Ambulatory Visit: Payer: Medicare Other | Admitting: Internal Medicine

## 2016-04-29 ENCOUNTER — Encounter: Payer: Self-pay | Admitting: Internal Medicine

## 2016-04-29 ENCOUNTER — Ambulatory Visit (INDEPENDENT_AMBULATORY_CARE_PROVIDER_SITE_OTHER): Payer: Medicare Other | Admitting: Internal Medicine

## 2016-04-29 VITALS — BP 118/72 | HR 78 | Temp 97.4°F | Resp 18 | Wt 144.5 lb

## 2016-04-29 DIAGNOSIS — E876 Hypokalemia: Secondary | ICD-10-CM

## 2016-04-29 DIAGNOSIS — E039 Hypothyroidism, unspecified: Secondary | ICD-10-CM | POA: Diagnosis not present

## 2016-04-29 DIAGNOSIS — K529 Noninfective gastroenteritis and colitis, unspecified: Secondary | ICD-10-CM

## 2016-04-29 DIAGNOSIS — E7800 Pure hypercholesterolemia, unspecified: Secondary | ICD-10-CM

## 2016-04-29 DIAGNOSIS — D649 Anemia, unspecified: Secondary | ICD-10-CM | POA: Diagnosis not present

## 2016-04-29 DIAGNOSIS — I10 Essential (primary) hypertension: Secondary | ICD-10-CM

## 2016-04-29 LAB — CBC WITH DIFFERENTIAL/PLATELET
Basophils Absolute: 0.1 10*3/uL (ref 0.0–0.1)
Basophils Relative: 1.4 % (ref 0.0–3.0)
Eosinophils Absolute: 0.2 10*3/uL (ref 0.0–0.7)
Eosinophils Relative: 3.3 % (ref 0.0–5.0)
HCT: 40 % (ref 36.0–46.0)
Hemoglobin: 13.5 g/dL (ref 12.0–15.0)
Lymphocytes Relative: 19.4 % (ref 12.0–46.0)
Lymphs Abs: 1.3 10*3/uL (ref 0.7–4.0)
MCHC: 33.8 g/dL (ref 30.0–36.0)
MCV: 95.4 fl (ref 78.0–100.0)
Monocytes Absolute: 0.7 10*3/uL (ref 0.1–1.0)
Monocytes Relative: 11.2 % (ref 3.0–12.0)
Neutro Abs: 4.2 10*3/uL (ref 1.4–7.7)
Neutrophils Relative %: 64.7 % (ref 43.0–77.0)
Platelets: 370 10*3/uL (ref 150.0–400.0)
RBC: 4.19 Mil/uL (ref 3.87–5.11)
RDW: 14.7 % (ref 11.5–15.5)
WBC: 6.5 10*3/uL (ref 4.0–10.5)

## 2016-04-29 LAB — BASIC METABOLIC PANEL
BUN: 12 mg/dL (ref 6–23)
CHLORIDE: 106 meq/L (ref 96–112)
CO2: 26 meq/L (ref 19–32)
CREATININE: 0.75 mg/dL (ref 0.40–1.20)
Calcium: 9.3 mg/dL (ref 8.4–10.5)
GFR: 81.58 mL/min (ref >60.00)
Glucose, Bld: 83 mg/dL (ref 70–99)
POTASSIUM: 3.5 meq/L (ref 3.5–5.1)
Sodium: 140 mEq/L (ref 135–145)

## 2016-04-29 LAB — FERRITIN: FERRITIN: 45 ng/mL (ref 10.0–291.0)

## 2016-04-29 NOTE — Progress Notes (Signed)
Patient ID: Savannah Cox, female   DOB: 1946-05-27, 70 y.o.   MRN: [id number]   Subjective:    Patient ID: Savannah Cox, female    DOB: 10/22/1945, 70 y.o.   MRN: [id number]  HPI  Patient here for hospital follow up.  She was admitted 04/14/16 and diagnosed with acute colitis - suspected ischemic.  She was placed on cipro and flagyl.  Recommended to f/u with GI as outpatient.  She is finishing her abx today.   Her bowels are doing better.  More formed.  Abdominal pain improved.  Overall feeling better.  Eating.  Breathing stable.     Past Medical History:  Diagnosis Date  . Allergic state   . Anemia   . Chronic kidney disease   . Hypercholesterolemia   . Hypertension   . Hypothyroidism   . Osteoporosis, postmenopausal   . Shingles    Past Surgical History:  Procedure Laterality Date  . BREAST BIOPSY     left, benign  . COLONOSCOPY WITH PROPOFOL N/A 01/28/2016   Procedure: COLONOSCOPY WITH PROPOFOL;  Surgeon: Lollie Sails, MD;  Location: [facility] ENDOSCOPY;  Service: Endoscopy;  Laterality: N/A;  . EYE SURGERY Left    Family History  Problem Relation Age of Onset  . Heart disease Father     died age 61 - myocardial infarction  . Hypercholesterolemia Mother   . Arthritis Mother   . Kidney cancer      aunt  . Breast cancer Maternal Aunt   . Colon cancer Neg Hx    Social History   Social History  . Marital status: Married    Spouse name: N/A  . Number of children: 3  . Years of education: 81   Occupational History  . retired Pharmacist, hospital   .  Retired   Social History Main Topics  . Smoking status: Former Research scientist (life sciences)  . Smokeless tobacco: Never Used  . Alcohol use 0.0 oz/week     Comment: Rare drink  . Drug use: No  . Sexual activity: Yes   Other Topics Concern  . None   Social History Narrative  . None    Outpatient Encounter Prescriptions as of 04/29/2016  Medication Sig  . levothyroxine (SYNTHROID, LEVOTHROID) 50 MCG tablet TAKE 1 TABLET (50 MCG TOTAL)  BY MOUTH DAILY.  Marland Kitchen loratadine (CLARITIN) 10 MG tablet Take 10 mg by mouth daily.  . metroNIDAZOLE (FLAGYL) 500 MG tablet Take 1 tablet (500 mg total) by mouth every 8 (eight) hours.  . sertraline (ZOLOFT) 50 MG tablet Take 1 tablet (50 mg total) by mouth daily.  . ciprofloxacin (CIPRO) 500 MG tablet Take 1 tablet (500 mg total) by mouth 2 (two) times daily. (Patient not taking: Reported on 04/29/2016)  . Multiple Vitamins-Minerals (CENTRUM SILVER ULTRA WOMENS PO) Take by mouth.  Marland Kitchen omeprazole (PRILOSEC) 20 MG capsule TAKE 1 CAPSULE (20 MG TOTAL) BY MOUTH DAILY. (Patient not taking: Reported on 04/29/2016)  . rosuvastatin (CRESTOR) 5 MG tablet TAKE 1 TABLET BY MOUTH ONCE A DAY (Patient not taking: Reported on 04/29/2016)   No facility-administered encounter medications on file as of 04/29/2016.     Review of Systems  Constitutional: Negative for appetite change and unexpected weight change.  HENT: Negative for congestion and sinus pressure.   Respiratory: Negative for chest tightness and shortness of breath.   Cardiovascular: Negative for chest pain and leg swelling.  Gastrointestinal: Negative for nausea and vomiting.       Bowels more formed.  Abdominal  pain improved.    Genitourinary: Negative for difficulty urinating and dysuria.  Musculoskeletal: Negative for back pain and joint swelling.  Skin: Negative for color change and rash.  Neurological: Negative for dizziness, light-headedness and headaches.  Psychiatric/Behavioral: Negative for agitation and dysphoric mood.       Objective:    Physical Exam  Constitutional: She appears well-developed and well-nourished. No distress.  HENT:  Nose: Nose normal.  Mouth/Throat: Oropharynx is clear and moist.  Neck: Neck supple.  Cardiovascular: Normal rate and regular rhythm.   Pulmonary/Chest: Breath sounds normal. No respiratory distress. She has no wheezes.  Abdominal: Soft. Bowel sounds are normal. There is no tenderness.    Musculoskeletal: She exhibits no edema or tenderness.  Lymphadenopathy:    She has no cervical adenopathy.  Skin: No rash noted. No erythema.  Psychiatric: She has a normal mood and affect. Her behavior is normal.    BP 118/72 (BP Location: Left Arm, Patient Position: Sitting, Cuff Size: Normal)   Pulse 78   Temp 97.4 F (36.3 C) (Oral)   Resp 18   Wt 144 lb 8 oz (65.5 kg)   SpO2 96%   BMI 25.60 kg/m  Wt Readings from Last 3 Encounters:  04/29/16 144 lb 8 oz (65.5 kg)  04/14/16 145 lb (65.8 kg)  03/17/16 146 lb 12.8 oz (66.6 kg)     Lab Results  Component Value Date   WBC 6.5 04/29/2016   HGB 13.5 04/29/2016   HCT 40.0 04/29/2016   PLT 370.0 04/29/2016   GLUCOSE 83 04/29/2016   CHOL 205 (H) 02/13/2016   TRIG 82.0 02/13/2016   HDL 70.60 02/13/2016   LDLDIRECT 165.9 05/11/2013   LDLCALC 118 (H) 02/13/2016   ALT 30 04/14/2016   AST 48 (H) 04/14/2016   NA 140 04/29/2016   K 3.5 04/29/2016   CL 106 04/29/2016   CREATININE 0.75 04/29/2016   BUN 12 04/29/2016   CO2 26 04/29/2016   TSH 2.18 02/13/2016   INR 0.96 04/14/2016    Ct Abdomen Pelvis W Contrast  Result Date: 04/14/2016 CLINICAL DATA:  Left lower quadrant abdominal pain radiating into back. EXAM: CT ABDOMEN AND PELVIS WITH CONTRAST TECHNIQUE: Multidetector CT imaging of the abdomen and pelvis was performed using the standard protocol following bolus administration of intravenous contrast. CONTRAST:  1109mL ISOVUE-300 IOPAMIDOL (ISOVUE-300) INJECTION 61% COMPARISON:  04/19/2014 FINDINGS: Lower chest: Parenchymal scarring noted at both lung bases. Hepatobiliary: Mild and diffuse hepatic steatosis noted. No overt cirrhotic changes. No focal lesions or biliary obstruction. The gallbladder is unremarkable. Pancreas: Unremarkable. No pancreatic ductal dilatation or surrounding inflammatory changes. Spleen: Normal in size without focal abnormality. Adrenals/Urinary Tract: Stable small benign renal cysts. No  hydronephrosis, renal calculi or masses. Adrenal glands are normal. The bladder is decompressed. Stomach/Bowel: Bowel shows evidence of circumferential thickening involving the descending and sigmoid colon. Other bowel loops are within normal limits. There is trace inflammatory change in the fat adjacent to the sigmoid colon without evidence of bowel perforation or focal abscess. No free air identified. Vascular/Lymphatic: No enlarged lymph nodes are seen. The abdominal aorta shows calcified plaque distally without evidence of aneurysm. Reproductive: Uterus and adnexal regions are unremarkable by CT. Other: No abdominal wall hernia or abnormality. No abdominopelvic ascites. Musculoskeletal: No acute or significant osseous findings. IMPRESSION: Colitis involving the descending and sigmoid colon without evidence of bowel perforation or abscess. Electronically Signed   By: Aletta Edouard M.D.   On: 04/14/2016 12:12  Assessment & Plan:   Problem List Items Addressed This Visit    Acute colitis    Recently admitted and found to have colitis.  Hospital notes reviewed.  Finishing abx today.  Stool more formed.  Abdominal pain improved.  Planning f/u with GI as outlined.        Anemia - Primary    Decreased hgb on recent admission.  Recheck cbc today.        Relevant Orders   CBC with Differential/Platelet (Completed)   Ferritin (Completed)   Hypercholesterolemia    Off crestor now.  Follow lipid panel.  If elevation, will need to restart.        Hypertension    Blood pressure under good control.   Follow pressures.  Follow metabolic panel.        Hypokalemia    Potassium low in the hospital.  Recheck today.        Relevant Orders   Basic metabolic panel (Completed)   Hypothyroidism    On thyroid replacement.  Follow tsh.            Einar Pheasant, MD

## 2016-04-29 NOTE — Progress Notes (Signed)
Pre visit review using our clinic review tool, if applicable. No additional management support is needed unless otherwise documented below in the visit note. 

## 2016-04-30 ENCOUNTER — Encounter: Payer: Self-pay | Admitting: Internal Medicine

## 2016-05-05 ENCOUNTER — Encounter: Payer: Self-pay | Admitting: Internal Medicine

## 2016-05-05 NOTE — Assessment & Plan Note (Signed)
On thyroid replacement.  Follow tsh.  

## 2016-05-05 NOTE — Assessment & Plan Note (Signed)
Off crestor now.  Follow lipid panel.  If elevation, will need to restart.

## 2016-05-05 NOTE — Assessment & Plan Note (Signed)
Decreased hgb on recent admission.  Recheck cbc today.

## 2016-05-05 NOTE — Assessment & Plan Note (Signed)
Recently admitted and found to have colitis.  Hospital notes reviewed.  Finishing abx today.  Stool more formed.  Abdominal pain improved.  Planning f/u with GI as outlined.

## 2016-05-05 NOTE — Assessment & Plan Note (Addendum)
Blood pressure under good control.  Follow pressures.  Follow metabolic panel.   

## 2016-05-05 NOTE — Assessment & Plan Note (Signed)
Potassium low in the hospital.  Recheck today.

## 2016-06-19 ENCOUNTER — Other Ambulatory Visit: Payer: Self-pay | Admitting: Internal Medicine

## 2016-08-15 ENCOUNTER — Other Ambulatory Visit (INDEPENDENT_AMBULATORY_CARE_PROVIDER_SITE_OTHER): Payer: Medicare Other

## 2016-08-15 DIAGNOSIS — E7800 Pure hypercholesterolemia, unspecified: Secondary | ICD-10-CM

## 2016-08-15 DIAGNOSIS — I10 Essential (primary) hypertension: Secondary | ICD-10-CM | POA: Diagnosis not present

## 2016-08-15 LAB — BASIC METABOLIC PANEL
BUN: 18 mg/dL (ref 6–23)
CALCIUM: 9.8 mg/dL (ref 8.4–10.5)
CO2: 31 mEq/L (ref 19–32)
Chloride: 103 mEq/L (ref 96–112)
Creatinine, Ser: 0.76 mg/dL (ref 0.40–1.20)
GFR: 80.27 mL/min (ref >60.00)
GLUCOSE: 96 mg/dL (ref 70–99)
POTASSIUM: 3.8 meq/L (ref 3.5–5.1)
SODIUM: 140 meq/L (ref 135–145)

## 2016-08-15 LAB — LIPID PANEL
CHOLESTEROL: 248 mg/dL — AB (ref 0–200)
HDL: 73.3 mg/dL (ref >39.00)
LDL CALC: 158 mg/dL — AB (ref 0–99)
NonHDL: 174.98
Total CHOL/HDL Ratio: 3
Triglycerides: 85 mg/dL (ref 0.0–149.0)
VLDL: 17 mg/dL (ref 0.0–40.0)

## 2016-08-15 LAB — HEPATIC FUNCTION PANEL
ALBUMIN: 4.2 g/dL (ref 3.5–5.2)
ALT: 25 U/L (ref 0–35)
AST: 34 U/L (ref 0–37)
Alkaline Phosphatase: 59 U/L (ref 39–117)
BILIRUBIN TOTAL: 0.4 mg/dL (ref 0.2–1.2)
Bilirubin, Direct: 0.1 mg/dL (ref 0.0–0.3)
TOTAL PROTEIN: 6.8 g/dL (ref 6.0–8.3)

## 2016-08-16 ENCOUNTER — Encounter: Payer: Self-pay | Admitting: Internal Medicine

## 2016-08-19 ENCOUNTER — Encounter: Payer: Self-pay | Admitting: Internal Medicine

## 2016-08-19 ENCOUNTER — Ambulatory Visit (INDEPENDENT_AMBULATORY_CARE_PROVIDER_SITE_OTHER): Payer: Medicare Other | Admitting: Internal Medicine

## 2016-08-19 VITALS — BP 104/58 | HR 74 | Temp 99.0°F | Resp 16 | Ht 63.0 in | Wt 140.0 lb

## 2016-08-19 DIAGNOSIS — K219 Gastro-esophageal reflux disease without esophagitis: Secondary | ICD-10-CM

## 2016-08-19 DIAGNOSIS — K529 Noninfective gastroenteritis and colitis, unspecified: Secondary | ICD-10-CM | POA: Diagnosis not present

## 2016-08-19 DIAGNOSIS — Z1231 Encounter for screening mammogram for malignant neoplasm of breast: Secondary | ICD-10-CM

## 2016-08-19 DIAGNOSIS — E7800 Pure hypercholesterolemia, unspecified: Secondary | ICD-10-CM | POA: Diagnosis not present

## 2016-08-19 DIAGNOSIS — I10 Essential (primary) hypertension: Secondary | ICD-10-CM | POA: Diagnosis not present

## 2016-08-19 DIAGNOSIS — E039 Hypothyroidism, unspecified: Secondary | ICD-10-CM

## 2016-08-19 DIAGNOSIS — R109 Unspecified abdominal pain: Secondary | ICD-10-CM | POA: Diagnosis not present

## 2016-08-19 DIAGNOSIS — Z1239 Encounter for other screening for malignant neoplasm of breast: Secondary | ICD-10-CM

## 2016-08-19 NOTE — Progress Notes (Signed)
Patient ID: Savannah Cox, female   DOB: June 29, 1945, 71 y.o.   MRN: [id number]   Subjective:    Patient ID: Savannah Cox, female    DOB: 06/29/1945, 71 y.o.   MRN: [id number]  HPI  Patient here for a scheduled follow up. Was admitted 04/14/16 with rectal bleeding.  Found to have colitis.  Treated with abx.  Better.  Saw GI in follow up.  Note reviewed.  She reports bowels are better.  No mucus now.  Still soft.  May go one time q day to q 2 days.  Still with abdominal discomfort.  Mid abdomen.  No nausea or vomiting.  No fever.  No chest pain.  Breathing stable.  If stops prilosec, needs TUMS.  Instructed to continue prilosec.     Past Medical History:  Diagnosis Date  . Allergic state   . Anemia   . Chronic kidney disease   . Hypercholesterolemia   . Hypertension   . Hypothyroidism   . Osteoporosis, postmenopausal   . Shingles    Past Surgical History:  Procedure Laterality Date  . BREAST BIOPSY     left, benign  . COLONOSCOPY WITH PROPOFOL N/A 01/28/2016   Procedure: COLONOSCOPY WITH PROPOFOL;  Surgeon: Lollie Sails, MD;  Location: [facility] ENDOSCOPY;  Service: Endoscopy;  Laterality: N/A;  . EYE SURGERY Left    Family History  Problem Relation Age of Onset  . Heart disease Father     died age 6 - myocardial infarction  . Hypercholesterolemia Mother   . Arthritis Mother   . Kidney cancer      aunt  . Breast cancer Maternal Aunt   . Colon cancer Neg Hx    Social History   Social History  . Marital status: Married    Spouse name: N/A  . Number of children: 3  . Years of education: 39   Occupational History  . retired Pharmacist, hospital   .  Retired   Social History Main Topics  . Smoking status: Former Research scientist (life sciences)  . Smokeless tobacco: Never Used  . Alcohol use 0.0 oz/week     Comment: Rare drink  . Drug use: No  . Sexual activity: Yes   Other Topics Concern  . None   Social History Narrative  . None    Outpatient Encounter Prescriptions as of 08/19/2016    Medication Sig  . levothyroxine (SYNTHROID, LEVOTHROID) 50 MCG tablet TAKE 1 TABLET (50 MCG TOTAL) BY MOUTH DAILY.  Marland Kitchen loratadine (CLARITIN) 10 MG tablet Take 10 mg by mouth daily.  . Multiple Vitamins-Minerals (CENTRUM SILVER ULTRA WOMENS PO) Take by mouth.  Marland Kitchen omeprazole (PRILOSEC) 20 MG capsule TAKE 1 CAPSULE (20 MG TOTAL) BY MOUTH DAILY.  . rosuvastatin (CRESTOR) 5 MG tablet TAKE 1 TABLET BY MOUTH ONCE A DAY  . sertraline (ZOLOFT) 50 MG tablet TAKE 1 TABLET (50 MG TOTAL) BY MOUTH DAILY.  . [DISCONTINUED] ciprofloxacin (CIPRO) 500 MG tablet Take 1 tablet (500 mg total) by mouth 2 (two) times daily.  . [DISCONTINUED] metroNIDAZOLE (FLAGYL) 500 MG tablet Take 1 tablet (500 mg total) by mouth every 8 (eight) hours.   No facility-administered encounter medications on file as of 08/19/2016.     Review of Systems  Constitutional: Negative for appetite change and unexpected weight change.  HENT: Negative for congestion and sinus pressure.   Respiratory: Negative for cough, chest tightness and shortness of breath.   Cardiovascular: Negative for chest pain, palpitations and leg swelling.  Gastrointestinal: Positive for abdominal  pain. Negative for nausea and vomiting.       Acid reflux if stops prilosec.  Still with soft stool.   Genitourinary: Negative for difficulty urinating and dysuria.  Musculoskeletal: Negative for back pain and joint swelling.  Skin: Negative for color change and rash.  Neurological: Negative for dizziness and headaches.  Psychiatric/Behavioral: Negative for agitation and dysphoric mood.       Objective:    Physical Exam  Constitutional: She appears well-developed and well-nourished. No distress.  HENT:  Nose: Nose normal.  Mouth/Throat: Oropharynx is clear and moist.  Neck: Neck supple. No thyromegaly present.  Cardiovascular: Normal rate and regular rhythm.   Pulmonary/Chest: Breath sounds normal. No respiratory distress. She has no wheezes.  Abdominal: Soft.  Bowel sounds are normal.  Minimal tenderness to palpation.    Musculoskeletal: She exhibits no edema or tenderness.  Lymphadenopathy:    She has no cervical adenopathy.  Skin: No rash noted. No erythema.  Psychiatric: She has a normal mood and affect. Her behavior is normal.    BP (!) 104/58 (BP Location: Left Arm, Patient Position: Sitting, Cuff Size: Large)   Pulse 74   Temp 99 F (37.2 C) (Oral)   Resp 16   Ht 5\' 3"  (1.6 m)   Wt 140 lb (63.5 kg)   SpO2 94%   BMI 24.80 kg/m  Wt Readings from Last 3 Encounters:  08/19/16 140 lb (63.5 kg)  04/29/16 144 lb 8 oz (65.5 kg)  04/14/16 145 lb (65.8 kg)     Lab Results  Component Value Date   WBC 6.5 04/29/2016   HGB 13.5 04/29/2016   HCT 40.0 04/29/2016   PLT 370.0 04/29/2016   GLUCOSE 96 08/15/2016   CHOL 248 (H) 08/15/2016   TRIG 85.0 08/15/2016   HDL 73.30 08/15/2016   LDLDIRECT 165.9 05/11/2013   LDLCALC 158 (H) 08/15/2016   ALT 25 08/15/2016   AST 34 08/15/2016   NA 140 08/15/2016   K 3.8 08/15/2016   CL 103 08/15/2016   CREATININE 0.76 08/15/2016   BUN 18 08/15/2016   CO2 31 08/15/2016   TSH 2.18 02/13/2016   INR 0.96 04/14/2016    Ct Abdomen Pelvis W Contrast  Result Date: 04/14/2016 CLINICAL DATA:  Left lower quadrant abdominal pain radiating into back. EXAM: CT ABDOMEN AND PELVIS WITH CONTRAST TECHNIQUE: Multidetector CT imaging of the abdomen and pelvis was performed using the standard protocol following bolus administration of intravenous contrast. CONTRAST:  16mL ISOVUE-300 IOPAMIDOL (ISOVUE-300) INJECTION 61% COMPARISON:  04/19/2014 FINDINGS: Lower chest: Parenchymal scarring noted at both lung bases. Hepatobiliary: Mild and diffuse hepatic steatosis noted. No overt cirrhotic changes. No focal lesions or biliary obstruction. The gallbladder is unremarkable. Pancreas: Unremarkable. No pancreatic ductal dilatation or surrounding inflammatory changes. Spleen: Normal in size without focal abnormality.  Adrenals/Urinary Tract: Stable small benign renal cysts. No hydronephrosis, renal calculi or masses. Adrenal glands are normal. The bladder is decompressed. Stomach/Bowel: Bowel shows evidence of circumferential thickening involving the descending and sigmoid colon. Other bowel loops are within normal limits. There is trace inflammatory change in the fat adjacent to the sigmoid colon without evidence of bowel perforation or focal abscess. No free air identified. Vascular/Lymphatic: No enlarged lymph nodes are seen. The abdominal aorta shows calcified plaque distally without evidence of aneurysm. Reproductive: Uterus and adnexal regions are unremarkable by CT. Other: No abdominal wall hernia or abnormality. No abdominopelvic ascites. Musculoskeletal: No acute or significant osseous findings. IMPRESSION: Colitis involving the descending and sigmoid colon  without evidence of bowel perforation or abscess. Electronically Signed   By: Aletta Edouard M.D.   On: 04/14/2016 12:12       Assessment & Plan:   Problem List Items Addressed This Visit    Abdominal pain    As outlined.  Persistent abdominal pain.  Refer back to GI.        Relevant Orders   Ambulatory referral to Gastroenterology   Acute colitis    Recently admitted and diagnosed with colitis.  Took abx.  No mucus in her stool.  Still with soft stool.  Still has abdominal pain.  Recent CT as outlined.  Refer back to GI for evaluation of persistent abdominal pain.        GERD (gastroesophageal reflux disease)    On prilosec.  Upper symptoms controlled.        Hypercholesterolemia    On crestor.  Will increased to 3 days per week.  Follow lipid panel and liver function tests.   Lab Results  Component Value Date   CHOL 248 (H) 08/15/2016   HDL 73.30 08/15/2016   LDLCALC 158 (H) 08/15/2016   LDLDIRECT 165.9 05/11/2013   TRIG 85.0 08/15/2016   CHOLHDL 3 08/15/2016        Relevant Orders   Lipid panel   Hepatic function panel    Hypertension    She started her blood pressure medication back when her blood pressure was elevated.  States she feels she is doing well now regarding her blood pressure.  Follow.        Relevant Orders   Basic metabolic panel   Hypothyroidism    On thyroid replacement.  Follow tsh.        Relevant Orders   TSH    Other Visit Diagnoses    Breast cancer screening    -  Primary   Relevant Orders   MM DIGITAL SCREENING BILATERAL       Einar Pheasant, MD

## 2016-08-19 NOTE — Progress Notes (Signed)
Pre-visit discussion using our clinic review tool. No additional management support is needed unless otherwise documented below in the visit note.  

## 2016-08-24 ENCOUNTER — Encounter: Payer: Self-pay | Admitting: Internal Medicine

## 2016-08-24 NOTE — Assessment & Plan Note (Signed)
She started her blood pressure medication back when her blood pressure was elevated.  States she feels she is doing well now regarding her blood pressure.  Follow.

## 2016-08-24 NOTE — Assessment & Plan Note (Signed)
Recently admitted and diagnosed with colitis.  Took abx.  No mucus in her stool.  Still with soft stool.  Still has abdominal pain.  Recent CT as outlined.  Refer back to GI for evaluation of persistent abdominal pain.

## 2016-08-24 NOTE — Assessment & Plan Note (Signed)
On crestor.  Will increased to 3 days per week.  Follow lipid panel and liver function tests.   Lab Results  Component Value Date   CHOL 248 (H) 08/15/2016   HDL 73.30 08/15/2016   LDLCALC 158 (H) 08/15/2016   LDLDIRECT 165.9 05/11/2013   TRIG 85.0 08/15/2016   CHOLHDL 3 08/15/2016

## 2016-08-24 NOTE — Assessment & Plan Note (Signed)
On thyroid replacement.  Follow tsh.  

## 2016-08-24 NOTE — Assessment & Plan Note (Signed)
As outlined.  Persistent abdominal pain.  Refer back to GI.

## 2016-08-24 NOTE — Assessment & Plan Note (Signed)
On prilosec.  Upper symptoms controlled.

## 2016-09-15 ENCOUNTER — Ambulatory Visit
Admission: RE | Admit: 2016-09-15 | Discharge: 2016-09-15 | Disposition: A | Discharge status: Home / Self Care | Payer: Medicare Other | Source: Ambulatory Visit | Attending: Internal Medicine | Admitting: Internal Medicine

## 2016-09-15 DIAGNOSIS — Z1231 Encounter for screening mammogram for malignant neoplasm of breast: Secondary | ICD-10-CM | POA: Diagnosis present

## 2016-09-15 DIAGNOSIS — Z1239 Encounter for other screening for malignant neoplasm of breast: Secondary | ICD-10-CM

## 2016-09-22 ENCOUNTER — Ambulatory Visit: Payer: Medicare Other | Admitting: Gastroenterology

## 2016-10-31 ENCOUNTER — Other Ambulatory Visit: Payer: Self-pay | Admitting: Internal Medicine

## 2016-11-05 ENCOUNTER — Other Ambulatory Visit: Payer: Self-pay | Admitting: Internal Medicine

## 2016-11-06 NOTE — Telephone Encounter (Signed)
Please confirm with pt that she has been taking this medication regularly.  If so, ok to refill x 3.

## 2016-11-06 NOTE — Telephone Encounter (Signed)
Per on 3/6 you advised that she restarted BP meds, is this it? No documentation to support this med on that visit.  Please advise for refill, thanks

## 2016-12-09 ENCOUNTER — Other Ambulatory Visit: Payer: Self-pay

## 2016-12-09 MED ORDER — OMEPRAZOLE 20 MG PO CPDR
DELAYED_RELEASE_CAPSULE | ORAL | 1 refills | Status: DC
Start: 2016-12-09 — End: 2017-07-03

## 2016-12-19 ENCOUNTER — Other Ambulatory Visit (INDEPENDENT_AMBULATORY_CARE_PROVIDER_SITE_OTHER): Payer: Medicare Other

## 2016-12-19 DIAGNOSIS — E039 Hypothyroidism, unspecified: Secondary | ICD-10-CM

## 2016-12-19 DIAGNOSIS — E7800 Pure hypercholesterolemia, unspecified: Secondary | ICD-10-CM

## 2016-12-19 DIAGNOSIS — I10 Essential (primary) hypertension: Secondary | ICD-10-CM

## 2016-12-19 LAB — BASIC METABOLIC PANEL
BUN: 17 mg/dL (ref 6–23)
CO2: 31 meq/L (ref 19–32)
CREATININE: 0.85 mg/dL (ref 0.40–1.20)
Calcium: 9.7 mg/dL (ref 8.4–10.5)
Chloride: 104 mEq/L (ref 96–112)
GFR: 70.47 mL/min (ref >60.00)
GLUCOSE: 98 mg/dL (ref 70–99)
Potassium: 4.2 mEq/L (ref 3.5–5.1)
Sodium: 141 mEq/L (ref 135–145)

## 2016-12-19 LAB — LIPID PANEL
Cholesterol: 231 mg/dL — ABNORMAL HIGH (ref 0–200)
HDL: 68.3 mg/dL (ref >39.00)
LDL Cholesterol: 141 mg/dL — ABNORMAL HIGH (ref 0–99)
NONHDL: 162.5
Total CHOL/HDL Ratio: 3
Triglycerides: 108 mg/dL (ref 0.0–149.0)
VLDL: 21.6 mg/dL (ref 0.0–40.0)

## 2016-12-19 LAB — HEPATIC FUNCTION PANEL
ALK PHOS: 56 U/L (ref 39–117)
ALT: 18 U/L (ref 0–35)
AST: 28 U/L (ref 0–37)
Albumin: 4 g/dL (ref 3.5–5.2)
BILIRUBIN TOTAL: 0.4 mg/dL (ref 0.2–1.2)
Bilirubin, Direct: 0.1 mg/dL (ref 0.0–0.3)
Total Protein: 6.8 g/dL (ref 6.0–8.3)

## 2016-12-19 LAB — TSH: TSH: 3.81 u[IU]/mL (ref 0.35–4.50)

## 2016-12-21 ENCOUNTER — Encounter: Payer: Self-pay | Admitting: Internal Medicine

## 2016-12-23 ENCOUNTER — Ambulatory Visit (INDEPENDENT_AMBULATORY_CARE_PROVIDER_SITE_OTHER): Payer: Medicare Other | Admitting: Internal Medicine

## 2016-12-23 ENCOUNTER — Encounter: Payer: Self-pay | Admitting: Internal Medicine

## 2016-12-23 DIAGNOSIS — E7800 Pure hypercholesterolemia, unspecified: Secondary | ICD-10-CM

## 2016-12-23 DIAGNOSIS — E039 Hypothyroidism, unspecified: Secondary | ICD-10-CM | POA: Diagnosis not present

## 2016-12-23 DIAGNOSIS — I10 Essential (primary) hypertension: Secondary | ICD-10-CM

## 2016-12-23 DIAGNOSIS — R109 Unspecified abdominal pain: Secondary | ICD-10-CM

## 2016-12-23 DIAGNOSIS — K219 Gastro-esophageal reflux disease without esophagitis: Secondary | ICD-10-CM

## 2016-12-23 NOTE — Progress Notes (Signed)
Pre-visit discussion using our clinic review tool. No additional management support is needed unless otherwise documented below in the visit note.  

## 2016-12-23 NOTE — Progress Notes (Signed)
Patient ID: Savannah Cox, female   DOB: [birth year], 71 y.o.   MRN: [id number]   Subjective:    Patient ID: Savannah Cox, female    DOB: 02/14/1946, 71 y.o.   MRN: [id number]  HPI  Patient here for a scheduled follow up.  She reports she is doing well.  Feels good.  Stays active.  No chest pain.  No sob.  No acid reflux.  No rectal bleeding.  No diarrhea.  Does report she still with have intermittent dull ache - abdomen.  Admitted 03/2016 and diagnosed with colitis. Treated with abx.  Overall bowels better as outlined, but does still have the intermittent aching as described.  Eating well.  No nausea or vomiting.  She is exercising - silver sneakers.  Discussed labs.  LDL 141.  Discussed cholesterol medication.     Past Medical History:  Diagnosis Date  . Allergic state   . Anemia   . Chronic kidney disease   . Hypercholesterolemia   . Hypertension   . Hypothyroidism   . Osteoporosis, postmenopausal   . Shingles    Past Surgical History:  Procedure Laterality Date  . BREAST BIOPSY     left, benign  . COLONOSCOPY WITH PROPOFOL N/A 01/28/2016   Procedure: COLONOSCOPY WITH PROPOFOL;  Surgeon: Lollie Sails, MD;  Location: [facility] ENDOSCOPY;  Service: Endoscopy;  Laterality: N/A;  . EYE SURGERY Left    Family History  Problem Relation Age of Onset  . Heart disease Father        died age 90 - myocardial infarction  . Hypercholesterolemia Mother   . Arthritis Mother   . Kidney cancer Unknown        aunt  . Breast cancer Maternal Aunt   . Colon cancer Neg Hx    Social History   Social History  . Marital status: Married    Spouse name: N/A  . Number of children: 3  . Years of education: 46   Occupational History  . retired Pharmacist, hospital   .  Retired   Social History Main Topics  . Smoking status: Former Research scientist (life sciences)  . Smokeless tobacco: Never Used  . Alcohol use 0.0 oz/week     Comment: Rare drink  . Drug use: No  . Sexual activity: Yes   Other Topics Concern  . None    Social History Narrative  . None    Outpatient Encounter Prescriptions as of 12/23/2016  Medication Sig  . levothyroxine (SYNTHROID, LEVOTHROID) 50 MCG tablet TAKE 1 TABLET (50 MCG TOTAL) BY MOUTH DAILY.  Marland Kitchen lisinopril-hydrochlorothiazide (PRINZIDE,ZESTORETIC) 10-12.5 MG tablet TAKE 1 TABLET BY MOUTH DAILY.  Marland Kitchen loratadine (CLARITIN) 10 MG tablet Take 10 mg by mouth daily.   . Multiple Vitamins-Minerals (CENTRUM SILVER ULTRA WOMENS PO) Take by mouth.  Marland Kitchen omeprazole (PRILOSEC) 20 MG capsule TAKE 1 CAPSULE (20 MG TOTAL) BY MOUTH DAILY.  . rosuvastatin (CRESTOR) 5 MG tablet TAKE 1 TABLET BY MOUTH ONCE A DAY  . sertraline (ZOLOFT) 50 MG tablet TAKE 1 TABLET (50 MG TOTAL) BY MOUTH DAILY.   No facility-administered encounter medications on file as of 12/23/2016.     Review of Systems  Constitutional: Negative for appetite change and unexpected weight change.  HENT: Negative for congestion and sinus pressure.   Respiratory: Negative for cough, chest tightness and shortness of breath.   Cardiovascular: Negative for chest pain, palpitations and leg swelling.  Gastrointestinal: Negative for diarrhea, nausea and vomiting.       Abdominal aching as  outlined.    Genitourinary: Negative for difficulty urinating and dysuria.  Musculoskeletal: Negative for back pain and joint swelling.  Skin: Negative for color change and rash.  Neurological: Negative for dizziness, light-headedness and headaches.  Psychiatric/Behavioral: Negative for agitation and dysphoric mood.       Objective:    Physical Exam  Constitutional: She appears well-developed and well-nourished.  HENT:  Nose: Nose normal.  Mouth/Throat: Oropharynx is clear and moist.  Neck: Neck supple. No thyromegaly present.  Cardiovascular: Normal rate and regular rhythm.   Pulmonary/Chest: Breath sounds normal. No respiratory distress. She has no wheezes.  Abdominal: Soft. Bowel sounds are normal. There is no tenderness.  Musculoskeletal:  She exhibits no edema or tenderness.  Lymphadenopathy:    She has no cervical adenopathy.  Skin: No rash noted. No erythema.  Psychiatric: She has a normal mood and affect. Her behavior is normal.    BP 110/60 (BP Location: Left Arm, Patient Position: Sitting, Cuff Size: Normal)   Pulse 95   Temp 98.6 F (37 C) (Oral)   Resp 12   Ht 5\' 3"  (1.6 m)   Wt 140 lb 12.8 oz (63.9 kg)   SpO2 95%   BMI 24.94 kg/m  Wt Readings from Last 3 Encounters:  12/23/16 140 lb 12.8 oz (63.9 kg)  08/19/16 140 lb (63.5 kg)  04/29/16 144 lb 8 oz (65.5 kg)     Lab Results  Component Value Date   WBC 6.5 04/29/2016   HGB 13.5 04/29/2016   HCT 40.0 04/29/2016   PLT 370.0 04/29/2016   GLUCOSE 98 12/19/2016   CHOL 231 (H) 12/19/2016   TRIG 108.0 12/19/2016   HDL 68.30 12/19/2016   LDLDIRECT 165.9 05/11/2013   LDLCALC 141 (H) 12/19/2016   ALT 18 12/19/2016   AST 28 12/19/2016   NA 141 12/19/2016   K 4.2 12/19/2016   CL 104 12/19/2016   CREATININE 0.85 12/19/2016   BUN 17 12/19/2016   CO2 31 12/19/2016   TSH 3.81 12/19/2016   INR 0.96 04/14/2016    Mm Screening Breast Tomo Bilateral  Result Date: 09/15/2016 CLINICAL DATA:  Screening. EXAM: 2D DIGITAL SCREENING BILATERAL MAMMOGRAM WITH CAD AND ADJUNCT TOMO COMPARISON:  Previous exam(s). ACR Breast Density Category c: The breast tissue is heterogeneously dense, which may obscure small masses. FINDINGS: There are no findings suspicious for malignancy. Images were processed with CAD. IMPRESSION: No mammographic evidence of malignancy. A result letter of this screening mammogram will be mailed directly to the patient. RECOMMENDATION: Screening mammogram in one year. (Code:SM-B-01Y) BI-RADS CATEGORY  1: Negative. Electronically Signed   By: Ammie Ferrier M.D.   On: 09/15/2016 11:35       Assessment & Plan:   Problem List Items Addressed This Visit    Abdominal pain    Persistent intermittent abdominal aching.  Recently admitted with colitis.   Needs f/u with GI given persistent aching.        Relevant Orders   Ambulatory referral to Gastroenterology   GERD (gastroesophageal reflux disease)    Upper symptoms controlled.  On omeprazole.        Hypercholesterolemia    Low cholesterol diet and exercise.  Discussed lab results.  Will increase crestor to three times per week.  Follow lipid panel and liver function tests.        Relevant Orders   Lipid panel   Hepatic function panel   Hypertension    Blood pressure under good control.  Continue same medication regimen.  Follow pressures.  Follow metabolic panel.        Relevant Orders   CBC with Differential/Platelet   Basic metabolic panel   Hypothyroidism    On thyroid replacement.  Follow tsh.           Einar Pheasant, MD

## 2016-12-24 ENCOUNTER — Encounter: Payer: Self-pay | Admitting: Internal Medicine

## 2016-12-24 NOTE — Assessment & Plan Note (Signed)
Low cholesterol diet and exercise.  Discussed lab results.  Will increase crestor to three times per week.  Follow lipid panel and liver function tests.

## 2016-12-24 NOTE — Assessment & Plan Note (Signed)
Blood pressure under good control.  Continue same medication regimen.  Follow pressures.  Follow metabolic panel.   

## 2016-12-24 NOTE — Assessment & Plan Note (Signed)
On thyroid replacement.  Follow tsh.  

## 2016-12-24 NOTE — Assessment & Plan Note (Signed)
Upper symptoms controlled.  On omeprazole.   

## 2016-12-24 NOTE — Assessment & Plan Note (Signed)
Persistent intermittent abdominal aching.  Recently admitted with colitis.  Needs f/u with GI given persistent aching.

## 2017-01-27 ENCOUNTER — Other Ambulatory Visit: Payer: Self-pay | Admitting: Gastroenterology

## 2017-01-27 DIAGNOSIS — R1084 Generalized abdominal pain: Secondary | ICD-10-CM

## 2017-01-27 DIAGNOSIS — Z8719 Personal history of other diseases of the digestive system: Secondary | ICD-10-CM

## 2017-01-27 DIAGNOSIS — I700 Atherosclerosis of aorta: Secondary | ICD-10-CM

## 2017-01-31 ENCOUNTER — Other Ambulatory Visit: Payer: Self-pay | Admitting: Internal Medicine

## 2017-02-03 ENCOUNTER — Ambulatory Visit
Admission: RE | Admit: 2017-02-03 | Discharge: 2017-02-03 | Disposition: A | Discharge status: Home / Self Care | Payer: Medicare Other | Source: Ambulatory Visit | Attending: Gastroenterology | Admitting: Gastroenterology

## 2017-02-03 DIAGNOSIS — Z8719 Personal history of other diseases of the digestive system: Secondary | ICD-10-CM | POA: Insufficient documentation

## 2017-02-03 DIAGNOSIS — K5730 Diverticulosis of large intestine without perforation or abscess without bleeding: Secondary | ICD-10-CM | POA: Insufficient documentation

## 2017-02-03 DIAGNOSIS — N281 Cyst of kidney, acquired: Secondary | ICD-10-CM | POA: Insufficient documentation

## 2017-02-03 DIAGNOSIS — R1084 Generalized abdominal pain: Secondary | ICD-10-CM | POA: Diagnosis present

## 2017-02-03 DIAGNOSIS — I700 Atherosclerosis of aorta: Secondary | ICD-10-CM | POA: Insufficient documentation

## 2017-02-03 LAB — POCT I-STAT CREATININE: CREATININE: 0.8 mg/dL (ref 0.44–1.00)

## 2017-02-03 MED ORDER — IOPAMIDOL (ISOVUE-370) INJECTION 76%
100.0000 mL | Freq: Once | INTRAVENOUS | Status: AC | PRN
  Administered 2017-02-03: 100 mL via INTRAVENOUS

## 2017-02-05 ENCOUNTER — Other Ambulatory Visit: Payer: Self-pay | Admitting: Internal Medicine

## 2017-03-05 ENCOUNTER — Telehealth: Payer: Self-pay | Admitting: Internal Medicine

## 2017-03-05 NOTE — Telephone Encounter (Signed)
LVM for pt to call Savannah Cox back and reschedule her AWV appt on 03/18/17 due to Denisa out of office.   Savannah Cox call back [phone]

## 2017-03-09 NOTE — Telephone Encounter (Signed)
Rescheduled to 03/19/17. Pt aware

## 2017-03-17 ENCOUNTER — Ambulatory Visit: Payer: Medicare Other

## 2017-03-18 ENCOUNTER — Ambulatory Visit: Payer: Medicare Other

## 2017-03-19 ENCOUNTER — Ambulatory Visit (INDEPENDENT_AMBULATORY_CARE_PROVIDER_SITE_OTHER): Payer: Medicare Other

## 2017-03-19 VITALS — BP 108/64 | HR 77 | Temp 98.4°F | Resp 14 | Ht 63.0 in | Wt 144.8 lb

## 2017-03-19 DIAGNOSIS — Z23 Encounter for immunization: Secondary | ICD-10-CM | POA: Diagnosis not present

## 2017-03-19 DIAGNOSIS — Z0000 Encounter for general adult medical examination without abnormal findings: Secondary | ICD-10-CM

## 2017-03-19 DIAGNOSIS — Z1331 Encounter for screening for depression: Secondary | ICD-10-CM

## 2017-03-19 NOTE — Patient Instructions (Addendum)
  Ms. Pitz , Thank you for taking time to come for your Medicare Wellness Visit. I appreciate your ongoing commitment to your health goals. Please review the following plan we discussed and let me know if I can assist you in the future.   Follow up with Dr. Nicki Reaper as needed.    Have a great day!  These are the goals we discussed: Goals    . Reduce portion size          Low carb foods       This is a list of the screening recommended for you and due dates:  Health Maintenance  Topic Date Due  . Mammogram  09/15/2017  . Tetanus Vaccine  12/08/2020  . Colon Cancer Screening  01/27/2021  . Flu Shot  Completed  . DEXA scan (bone density measurement)  Completed  .  Hepatitis C: One time screening is recommended by Center for Disease Control  (CDC) for  adults born from [birth year] through 1965.   Completed  . Pneumonia vaccines  Completed

## 2017-03-19 NOTE — Progress Notes (Signed)
Subjective:   Savannah Cox is a 71 y.o. female who presents for Medicare Annual (Subsequent) preventive examination.  Review of Systems:  No ROS.  Medicare Wellness Visit. Additional risk factors are reflected in the social history.  Cardiac Risk Factors include: advanced age (>62men, >56 women);hypertension     Objective:     Vitals: BP 108/64 (BP Location: Left Arm, Patient Position: Sitting, Cuff Size: Normal)   Pulse 77   Temp 98.4 F (36.9 C) (Oral)   Resp 14   Ht 5\' 3"  (1.6 m)   Wt 144 lb 12.8 oz (65.7 kg)   SpO2 97%   BMI 25.65 kg/m   Body mass index is 25.65 kg/m.   Tobacco History  Smoking Status  . Former Smoker  Smokeless Tobacco  . Never Used     Counseling given: Not Answered   Past Medical History:  Diagnosis Date  . Allergic state   . Anemia   . Chronic kidney disease   . Hypercholesterolemia   . Hypertension   . Hypothyroidism   . Osteoporosis, postmenopausal   . Shingles    Past Surgical History:  Procedure Laterality Date  . BREAST BIOPSY     left, benign  . COLONOSCOPY WITH PROPOFOL N/A 01/28/2016   Procedure: COLONOSCOPY WITH PROPOFOL;  Surgeon: Lollie Sails, MD;  Location: [facility] ENDOSCOPY;  Service: Endoscopy;  Laterality: N/A;  . EYE SURGERY Left    Family History  Problem Relation Age of Onset  . Heart disease Father        died age 66 - myocardial infarction  . Hypercholesterolemia Mother   . Arthritis Mother   . Kidney cancer Unknown        aunt  . Breast cancer Maternal Aunt   . Colon cancer Neg Hx    History  Sexual Activity  . Sexual activity: Yes    Outpatient Encounter Prescriptions as of 03/19/2017  Medication Sig  . levothyroxine (SYNTHROID, LEVOTHROID) 50 MCG tablet TAKE 1 TABLET (50 MCG TOTAL) BY MOUTH DAILY.  Marland Kitchen lisinopril-hydrochlorothiazide (PRINZIDE,ZESTORETIC) 10-12.5 MG tablet TAKE 1 TABLET BY MOUTH DAILY.  Marland Kitchen loratadine (CLARITIN) 10 MG tablet Take 10 mg by mouth daily.   . Multiple  Vitamins-Minerals (CENTRUM SILVER ULTRA WOMENS PO) Take by mouth.  Marland Kitchen omeprazole (PRILOSEC) 20 MG capsule TAKE 1 CAPSULE (20 MG TOTAL) BY MOUTH DAILY.  . rosuvastatin (CRESTOR) 5 MG tablet TAKE 1 TABLET BY MOUTH ONCE A DAY  . sertraline (ZOLOFT) 50 MG tablet TAKE 1 TABLET (50 MG TOTAL) BY MOUTH DAILY.   No facility-administered encounter medications on file as of 03/19/2017.     Activities of Daily Living In your present state of health, do you have any difficulty performing the following activities: 03/19/2017 04/14/2016  Hearing? N N  Vision? N N  Difficulty concentrating or making decisions? N N  Walking or climbing stairs? N N  Dressing or bathing? N N  Doing errands, shopping? N N  Preparing Food and eating ? N -  Using the Toilet? N -  In the past six months, have you accidently leaked urine? N -  Do you have problems with loss of bowel control? N -  Managing your Medications? N -  Managing your Finances? N -  Housekeeping or managing your Housekeeping? N -  Some recent data might be hidden    Patient Care Team: Einar Pheasant, MD as PCP - General (Internal Medicine)    Assessment:    This is a routine  wellness examination for Ada. The goal of the wellness visit is to assist the patient how to close the gaps in care and create a preventative care plan for the patient.   The roster of all physicians providing medical care to patient is listed in the Snapshot section of the chart.  Osteoporosis risk reviewed.    Safety issues reviewed; Smoke and carbon monoxide detectors in the home. No firearms in the home.  Wears seatbelts when driving or riding with others. Patient does wear sunscreen or protective clothing when in direct sunlight. No violence in the home.  Depression- PHQ 2 &9 complete.  No signs/symptoms or verbal communication regarding little pleasure in doing things, feeling down, depressed or hopeless. No changes in sleeping, energy, eating, concentrating.  No  thoughts of self harm or harm towards others.  Time spent on this topic is 8 minutes.   Patient is alert, normal appearance, oriented to person/place/and time.  Correctly identified the president of the [place], recall of 3/3 words, and performing simple calculations. Displays appropriate judgement and can read correct time from watch face.   No new identified risk were noted.  No failures at ADL's or IADL's.    BMI- discussed the importance of a healthy diet, water intake and the benefits of aerobic exercise. Educational material provided.   24 hour diet recall: Breakfast: scrambled egg with bacon bits  Lunch: beef stew, rice  Dinner: steak, salad, mashed potatoes  Snack: tangerine Daily fluid intake: 3 cups of caffeine, 7-8 cups of water, 1 cup orange juice  Dental- every 6 months.  Eye- Visual acuity not assessed per patient preference since they have regular follow up with the ophthalmologist.  Wears corrective lenses.  Sleep patterns- Sleeps 8 hours at night.  Wakes feeling rested.   High dose influenza vaccine administered L deltoid, tolerated well. Educational material provided.  Health maintenance gaps- closed.  Patient Concerns: None at this time. Follow up with PCP as needed.  Exercise Activities and Dietary recommendations Current Exercise Habits: Structured exercise class, Type of exercise: stretching;strength training/weights;calisthenics (Silver sneaker program), Time (Minutes): 45, Frequency (Times/Week): 2, Weekly Exercise (Minutes/Week): 90, Intensity: Moderate  Goals    . Reduce portion size          Low carb foods      Fall Risk Fall Risk  03/19/2017 03/17/2016 02/19/2016 09/04/2014 02/13/2014  Falls in the past year? No No No No No   Depression Screen PHQ 2/9 Scores 03/19/2017 12/23/2016 03/17/2016 02/19/2016  PHQ - 2 Score 0 0 0 0  PHQ- 9 Score 0 0 - -     Cognitive Function MMSE - Mini Mental State Exam 03/19/2017 03/17/2016  Orientation to time 5 5    Orientation to Place 5 5  Registration 3 3  Attention/ Calculation 5 5  Recall 3 3  Language- name 2 objects 2 2  Language- repeat 1 1  Language- follow 3 step command 3 3  Language- read & follow direction 1 1  Write a sentence 1 1  Copy design 1 1  Total score 30 30        Immunization History  Administered Date(s) Administered  . DTaP 12/09/2010  . Influenza Split 04/29/2012  . Influenza, High Dose Seasonal PF 02/19/2016, 03/19/2017  . Influenza,inj,Quad PF,6+ Mos 02/21/2013, 02/13/2014, 05/14/2015  . Pneumococcal Conjugate-13 04/28/2013  . Pneumococcal Polysaccharide-23 02/13/2014  . Zoster 10/12/2011   Screening Tests Health Maintenance  Topic Date Due  . MAMMOGRAM  09/15/2017  . TETANUS/TDAP  12/08/2020  . COLONOSCOPY  01/27/2021  . INFLUENZA VACCINE  Completed  . DEXA SCAN  Completed  . Hepatitis C Screening  Completed  . PNA vac Low Risk Adult  Completed      Plan:   End of life planning; Advanced aging; Advanced directives discussed.  No HCPOA/Living Will.  Additional information declined at this time.  I have personally reviewed and noted the following in the patient's chart:   . Medical and social history . Use of alcohol, tobacco or illicit drugs  . Current medications and supplements . Functional ability and status . Nutritional status . Physical activity . Advanced directives . List of other physicians . Hospitalizations, surgeries, and ER visits in previous 12 months . Vitals . Screenings to include cognitive, depression, and falls . Referrals and appointments  In addition, I have reviewed and discussed with patient certain preventive protocols, quality metrics, and best practice recommendations. A written personalized care plan for preventive services as well as general preventive health recommendations were provided to patient.     OBrien-Blaney, Cardelia Sassano L, LPN  68/11/[id number]   Reviewed above information.  Agree with assessment and plan.   Dr Nicki Reaper

## 2017-03-23 ENCOUNTER — Encounter: Payer: Self-pay | Admitting: Family

## 2017-03-23 ENCOUNTER — Ambulatory Visit (INDEPENDENT_AMBULATORY_CARE_PROVIDER_SITE_OTHER): Payer: Medicare Other | Admitting: Family

## 2017-03-23 VITALS — BP 132/78 | HR 71 | Temp 98.1°F | Ht 63.0 in | Wt 144.2 lb

## 2017-03-23 DIAGNOSIS — Z0000 Encounter for general adult medical examination without abnormal findings: Secondary | ICD-10-CM

## 2017-03-23 DIAGNOSIS — M85641 Other cyst of bone, right hand: Secondary | ICD-10-CM | POA: Diagnosis not present

## 2017-03-23 NOTE — Progress Notes (Signed)
Pre visit review using our clinic review tool, if applicable. No additional management support is needed unless otherwise documented below in the visit note. 

## 2017-03-23 NOTE — Patient Instructions (Signed)
Referrals placed  Please let us know if you don't hear from [place] regarding referrals  Pleasure meeting you!

## 2017-03-23 NOTE — Progress Notes (Signed)
Subjective:    Patient ID: Savannah Cox, female    DOB: Nov 01, 1945, 71 y.o.   MRN: [id number]  CC: Savannah Cox is a 70 y.o. female who presents today for an acute visit.    HPI: CC: cyst on right pinky x one year,  has enlarged. Nonfluctuant. Slightly tender. No problems with range of motion. 'thought from arthritis.'   Also notes that she had asked PCP for referral to dermatology for annual skin check and never heard from [place].          HISTORY:  Past Medical History:  Diagnosis Date  . Allergic state   . Anemia   . Chronic kidney disease   . Hypercholesterolemia   . Hypertension   . Hypothyroidism   . Osteoporosis, postmenopausal   . Shingles    Past Surgical History:  Procedure Laterality Date  . BREAST BIOPSY     left, benign  . COLONOSCOPY WITH PROPOFOL N/A 01/28/2016   Procedure: COLONOSCOPY WITH PROPOFOL;  Surgeon: Lollie Sails, MD;  Location: [facility] ENDOSCOPY;  Service: Endoscopy;  Laterality: N/A;  . EYE SURGERY Left    Family History  Problem Relation Age of Onset  . Heart disease Father        died age 64 - myocardial infarction  . Hypercholesterolemia Mother   . Arthritis Mother   . Kidney cancer Unknown        aunt  . Breast cancer Maternal Aunt   . Colon cancer Neg Hx     Allergies: Augmentin [amoxicillin-pot clavulanate] Current Outpatient Prescriptions on File Prior to Visit  Medication Sig Dispense Refill  . levothyroxine (SYNTHROID, LEVOTHROID) 50 MCG tablet TAKE 1 TABLET (50 MCG TOTAL) BY MOUTH DAILY. 30 tablet 6  . lisinopril-hydrochlorothiazide (PRINZIDE,ZESTORETIC) 10-12.5 MG tablet TAKE 1 TABLET BY MOUTH DAILY. 90 tablet 1  . loratadine (CLARITIN) 10 MG tablet Take 10 mg by mouth daily.     . Multiple Vitamins-Minerals (CENTRUM SILVER ULTRA WOMENS PO) Take by mouth.    Marland Kitchen omeprazole (PRILOSEC) 20 MG capsule TAKE 1 CAPSULE (20 MG TOTAL) BY MOUTH DAILY. 90 capsule 1  . rosuvastatin (CRESTOR) 5 MG tablet TAKE 1 TABLET BY MOUTH  ONCE A DAY 30 tablet 4  . sertraline (ZOLOFT) 50 MG tablet TAKE 1 TABLET (50 MG TOTAL) BY MOUTH DAILY. 90 tablet 3   No current facility-administered medications on file prior to visit.     Social History  Substance Use Topics  . Smoking status: Former Research scientist (life sciences)  . Smokeless tobacco: Never Used  . Alcohol use 0.0 oz/week     Comment: Rare drink    Review of Systems  Constitutional: Negative for chills and fever.  Respiratory: Negative for cough.   Cardiovascular: Negative for chest pain and palpitations.  Gastrointestinal: Negative for nausea and vomiting.  Musculoskeletal: Positive for arthralgias. Negative for joint swelling and myalgias.      Objective:    BP 132/78   Pulse 71   Temp 98.1 F (36.7 C) (Oral)   Ht 5\' 3"  (1.6 m)   Wt 144 lb 3.2 oz (65.4 kg)   SpO2 93%   BMI 25.54 kg/m    Physical Exam  Constitutional: She appears well-developed and well-nourished.  Eyes: Conjunctivae are normal.  Cardiovascular: Normal rate, regular rhythm, normal heart sounds and normal pulses.   Pulmonary/Chest: Effort normal and breath sounds normal. She has no wheezes. She has no rhonchi. She has no rales.  Musculoskeletal:  Right hand: She exhibits normal range of motion, no tenderness, no bony tenderness, no laceration and no swelling. Normal sensation noted. Normal strength noted.       Hands: approx 2cm cyst noted medial side of 5th MCP. Non fluctuant. No erythema. Mildly tender.   Neurological: She is alert.  Skin: Skin is warm and dry.  Psychiatric: She has a normal mood and affect. Her speech is normal and behavior is normal. Thought content normal.  Vitals reviewed.      Assessment & Plan:   1. Cyst of bone of right hand Suspect cyst. Referral to orthopedic for evaluation/ surgical consult.  - Ambulatory referral to Orthopedic Surgery  2. Health care maintenance Referral to derm as requested.  - Ambulatory referral to Dermatology    I am having Savannah Cox  maintain her loratadine, Multiple Vitamins-Minerals (CENTRUM SILVER ULTRA WOMENS PO), sertraline, lisinopril-hydrochlorothiazide, omeprazole, rosuvastatin, and levothyroxine.   No orders of the defined types were placed in this encounter.   Return precautions given.   Risks, benefits, and alternatives of the medications and treatment plan prescribed today were discussed, and patient expressed understanding.   Education regarding symptom management and diagnosis given to patient on AVS.  Continue to follow with Einar Pheasant, MD for routine health maintenance.   Patrick Jupiter and I agreed with plan.   Mable Paris, FNP

## 2017-04-02 ENCOUNTER — Other Ambulatory Visit: Payer: Self-pay

## 2017-04-02 DIAGNOSIS — E039 Hypothyroidism, unspecified: Secondary | ICD-10-CM

## 2017-04-20 ENCOUNTER — Encounter
Admission: RE | Admit: 2017-04-20 | Discharge: 2017-04-20 | Disposition: A | Discharge status: Home / Self Care | Payer: Medicare Other | Source: Ambulatory Visit | Attending: Orthopedic Surgery | Admitting: Orthopedic Surgery

## 2017-04-20 ENCOUNTER — Telehealth: Payer: Self-pay

## 2017-04-20 ENCOUNTER — Other Ambulatory Visit: Payer: Self-pay

## 2017-04-20 DIAGNOSIS — Z01818 Encounter for other preprocedural examination: Secondary | ICD-10-CM | POA: Insufficient documentation

## 2017-04-20 DIAGNOSIS — D649 Anemia, unspecified: Secondary | ICD-10-CM | POA: Insufficient documentation

## 2017-04-20 DIAGNOSIS — I10 Essential (primary) hypertension: Secondary | ICD-10-CM | POA: Diagnosis not present

## 2017-04-20 DIAGNOSIS — R001 Bradycardia, unspecified: Secondary | ICD-10-CM | POA: Diagnosis not present

## 2017-04-20 HISTORY — DX: Personal history of other diseases of the digestive system: Z87.19

## 2017-04-20 HISTORY — DX: Other specified postprocedural states: Z98.890

## 2017-04-20 HISTORY — DX: Nausea with vomiting, unspecified: R11.2

## 2017-04-20 HISTORY — DX: Gastro-esophageal reflux disease without esophagitis: K21.9

## 2017-04-20 HISTORY — DX: Personal history of urinary calculi: Z87.442

## 2017-04-20 HISTORY — DX: Other complications of anesthesia, initial encounter: T88.59XA

## 2017-04-20 HISTORY — DX: Diverticulosis of intestine, part unspecified, without perforation or abscess without bleeding: K57.90

## 2017-04-20 HISTORY — DX: Adverse effect of unspecified anesthetic, initial encounter: T41.45XA

## 2017-04-20 LAB — CBC
HCT: 40.7 % (ref 35.0–47.0)
Hemoglobin: 13.5 g/dL (ref 12.0–16.0)
MCH: 32.4 pg (ref 26.0–34.0)
MCHC: 33.2 g/dL (ref 32.0–36.0)
MCV: 97.7 fL (ref 80.0–100.0)
PLATELETS: 229 10*3/uL (ref 150–440)
RBC: 4.17 MIL/uL (ref 3.80–5.20)
RDW: 13.7 % (ref 11.5–14.5)
WBC: 4.9 10*3/uL (ref 3.6–11.0)

## 2017-04-20 LAB — POTASSIUM: POTASSIUM: 3.7 mmol/L (ref 3.5–5.1)

## 2017-04-20 NOTE — Telephone Encounter (Signed)
It they are needing surgical clearance, she needs an appt.  Can hold for work in.  Confirm what type of surgery and when

## 2017-04-20 NOTE — Telephone Encounter (Signed)
Received EKG report from surgical clearance. Per note sent copy to surgeon as well. Please advise. Placed in your blue folder.

## 2017-04-20 NOTE — Patient Instructions (Signed)
Your procedure is scheduled on: Thursday Nov. 15, 2018. Report to Same Day Surgery To find out your arrival time please call [phone] between 1PM - 3PM on Wednesday Nov. 14, 2018.  Remember: Instructions that are not followed completely may result in serious medical risk, up to and including death, or upon the discretion of your surgeon and anesthesiologist your surgery may need to be rescheduled.     _x__ 1. Do not eat food after midnight the night before your procedure.                 No gum chewing or hard candies. You may drink clear liquids up to 2 hours                 before you are scheduled to arrive for your surgery- DO not drink clear                 liquids within 2 hours of the start of your surgery.                 Clear Liquids include:  water, apple juice without pulp, clear carbohydrate                 drink such as Clearfast of Gartorade, Black Coffee or Tea (Do not add                 anything to coffee or tea).     _X__ 2.  No Alcohol for 24 hours before or after surgery.   ___ 3.  Do Not Smoke or use e-cigarettes For 24 Hours Prior to Your Surgery.                 Do not use any chewable tobacco products for at least 6 hours prior to                 surgery.  ____  4.  Bring all medications with you on the day of surgery if instructed.   __x__  5.  Notify your doctor if there is any change in your medical condition      (cold, fever, infections).     Do not wear jewelry, make-up, hairpins, clips or nail polish. Do not wear lotions, powders, or perfumes. You may wear deodorant. Do not shave 48 hours prior to surgery. Men may shave face and neck. Do not bring valuables to the hospital.    [facility] is not responsible for any belongings or valuables.  Contacts, dentures or bridgework may not be worn into surgery. Leave your suitcase in the car. After surgery it may be brought to your room. For patients admitted to the hospital,  discharge time is determined by your treatment team.   Patients discharged the day of surgery will not be allowed to drive home.   Please read over the following fact sheets that you were given:   Preparing for surgery.  __x__ Take these medicines the morning of surgery with A SIP OF WATER:    1. levothyroxine (SYNTHROID, LEVOTHROID)   2. omeprazole (PRILOSEC) take at bedtime the night prior to surgery and am of surgery.  3. sertraline (ZOLOFT)     ____ Fleet Enema (as directed)   _x___ Use CHG Soap as directed  ____ Use inhalers on the day of surgery  ____ Stop metformin 2 days prior to surgery    ____ Take 1/2 of usual insulin dose the night before surgery. No insulin the morning  of surgery.   ____ Stop Coumadin/Plavix/aspirin on does not apply.  _x___ Stop Anti-inflammatories on Advil, Aleve, Motrin, Ibuprofen, Naproxen, Naprosyn, Goodies Powders or Aspirin  Products. OK to take Tylenol for pain.   ____ Stop supplements until after surgery.    ____ Bring C-Pap to the hospital.

## 2017-04-20 NOTE — Pre-Procedure Instructions (Signed)
EKG/FORM TO ADDRESS PREOP EKG CALLED AND FAXED TO DR C SCOTT AS INSTRUCTED BY DR [place]. SPOKE WITH STAFF AND FAXED TO [phone]. ALSO FAXED COPY TO DR [facility] OFFICE

## 2017-04-21 ENCOUNTER — Telehealth: Payer: Self-pay | Admitting: Internal Medicine

## 2017-04-21 NOTE — Telephone Encounter (Signed)
Per other phone note patient aware of app and so is [facility]

## 2017-04-21 NOTE — Telephone Encounter (Signed)
Spoke with Myriam Jacobson at [facility]. She is aware that patient has appt on 04/29/17

## 2017-04-21 NOTE — Telephone Encounter (Signed)
Copied from [place] #[id number]. Topic: Quick Communication - See Telephone Encounter >> Apr 21, 2017  8:52 AM Robina Ade, Helene Kelp D wrote: CRM for notification. See Telephone encounter for:  [facility] called asking if patient has a medical clearance appointment schedule. She is having surgery on 04/30/17 for a right finger cyst removal. Please call office when appt is made, thanks. 04/21/17.

## 2017-04-24 ENCOUNTER — Telehealth: Payer: Self-pay | Admitting: *Deleted

## 2017-04-24 NOTE — Telephone Encounter (Signed)
Called back and let her know that we will fill out on 11/14 and fax to them.

## 2017-04-24 NOTE — Telephone Encounter (Signed)
Copied from [place]. Topic: Inquiry >> Apr 24, 2017 11:12 AM Cecelia Byars, NT wrote: Reason for CRM: Langley Gauss from [facility]  called to follow up on paper work that was faxed on 11/5/ to be filled out for surgery  on  11/ 15 by Dr. Nicki Reaper  and would like a return call back  [phone] e

## 2017-04-27 ENCOUNTER — Other Ambulatory Visit (INDEPENDENT_AMBULATORY_CARE_PROVIDER_SITE_OTHER): Payer: Medicare Other

## 2017-04-27 DIAGNOSIS — I10 Essential (primary) hypertension: Secondary | ICD-10-CM

## 2017-04-27 DIAGNOSIS — E039 Hypothyroidism, unspecified: Secondary | ICD-10-CM

## 2017-04-27 DIAGNOSIS — E7800 Pure hypercholesterolemia, unspecified: Secondary | ICD-10-CM

## 2017-04-27 LAB — CBC WITH DIFFERENTIAL/PLATELET
BASOS PCT: 2.5 % (ref 0.0–3.0)
Basophils Absolute: 0.1 10*3/uL (ref 0.0–0.1)
Eosinophils Absolute: 0.4 10*3/uL (ref 0.0–0.7)
Eosinophils Relative: 9.9 % — ABNORMAL HIGH (ref 0.0–5.0)
HEMATOCRIT: 42.2 % (ref 36.0–46.0)
HEMOGLOBIN: 14 g/dL (ref 12.0–15.0)
LYMPHS PCT: 29.4 % (ref 12.0–46.0)
Lymphs Abs: 1.2 10*3/uL (ref 0.7–4.0)
MCHC: 33.3 g/dL (ref 30.0–36.0)
MCV: 99.3 fl (ref 78.0–100.0)
Monocytes Absolute: 0.6 10*3/uL (ref 0.1–1.0)
Monocytes Relative: 15.5 % — ABNORMAL HIGH (ref 3.0–12.0)
Neutro Abs: 1.8 10*3/uL (ref 1.4–7.7)
Neutrophils Relative %: 42.7 % — ABNORMAL LOW (ref 43.0–77.0)
Platelets: 236 10*3/uL (ref 150.0–400.0)
RBC: 4.24 Mil/uL (ref 3.87–5.11)
RDW: 13.6 % (ref 11.5–15.5)
WBC: 4.1 10*3/uL (ref 4.0–10.5)

## 2017-04-27 LAB — BASIC METABOLIC PANEL
BUN: 17 mg/dL (ref 6–23)
CHLORIDE: 103 meq/L (ref 96–112)
CO2: 30 mEq/L (ref 19–32)
Calcium: 9.7 mg/dL (ref 8.4–10.5)
Creatinine, Ser: 0.75 mg/dL (ref 0.40–1.20)
GFR: 81.34 mL/min (ref >60.00)
Glucose, Bld: 99 mg/dL (ref 70–99)
POTASSIUM: 3.7 meq/L (ref 3.5–5.1)
Sodium: 140 mEq/L (ref 135–145)

## 2017-04-27 LAB — LIPID PANEL
CHOL/HDL RATIO: 3
Cholesterol: 238 mg/dL — ABNORMAL HIGH (ref 0–200)
HDL: 72.2 mg/dL (ref >39.00)
LDL CALC: 140 mg/dL — AB (ref 0–99)
NONHDL: 166.15
Triglycerides: 132 mg/dL (ref 0.0–149.0)
VLDL: 26.4 mg/dL (ref 0.0–40.0)

## 2017-04-27 LAB — TSH: TSH: 4.63 u[IU]/mL — AB (ref 0.35–4.50)

## 2017-04-27 LAB — HEPATIC FUNCTION PANEL
ALBUMIN: 4.1 g/dL (ref 3.5–5.2)
ALT: 19 U/L (ref 0–35)
AST: 31 U/L (ref 0–37)
Alkaline Phosphatase: 54 U/L (ref 39–117)
Bilirubin, Direct: 0.1 mg/dL (ref 0.0–0.3)
Total Bilirubin: 0.5 mg/dL (ref 0.2–1.2)
Total Protein: 6.9 g/dL (ref 6.0–8.3)

## 2017-04-29 ENCOUNTER — Other Ambulatory Visit (HOSPITAL_COMMUNITY)
Admission: RE | Admit: 2017-04-29 | Discharge: 2017-04-29 | Disposition: A | Discharge status: Home / Self Care | Payer: Medicare Other | Source: Ambulatory Visit | Attending: Internal Medicine | Admitting: Internal Medicine

## 2017-04-29 ENCOUNTER — Other Ambulatory Visit: Payer: Self-pay

## 2017-04-29 ENCOUNTER — Encounter: Payer: Self-pay | Admitting: Internal Medicine

## 2017-04-29 ENCOUNTER — Ambulatory Visit (INDEPENDENT_AMBULATORY_CARE_PROVIDER_SITE_OTHER): Payer: Medicare Other | Admitting: Internal Medicine

## 2017-04-29 ENCOUNTER — Telehealth: Payer: Self-pay

## 2017-04-29 VITALS — BP 110/60 | HR 70 | Temp 98.6°F | Resp 14 | Ht 63.0 in | Wt 145.6 lb

## 2017-04-29 DIAGNOSIS — Z01818 Encounter for other preprocedural examination: Secondary | ICD-10-CM

## 2017-04-29 DIAGNOSIS — E7800 Pure hypercholesterolemia, unspecified: Secondary | ICD-10-CM

## 2017-04-29 DIAGNOSIS — E039 Hypothyroidism, unspecified: Secondary | ICD-10-CM | POA: Diagnosis not present

## 2017-04-29 DIAGNOSIS — R0602 Shortness of breath: Secondary | ICD-10-CM | POA: Diagnosis not present

## 2017-04-29 DIAGNOSIS — I10 Essential (primary) hypertension: Secondary | ICD-10-CM

## 2017-04-29 DIAGNOSIS — K219 Gastro-esophageal reflux disease without esophagitis: Secondary | ICD-10-CM | POA: Diagnosis not present

## 2017-04-29 DIAGNOSIS — R9431 Abnormal electrocardiogram [ECG] [EKG]: Secondary | ICD-10-CM

## 2017-04-29 DIAGNOSIS — Z0000 Encounter for general adult medical examination without abnormal findings: Secondary | ICD-10-CM | POA: Diagnosis not present

## 2017-04-29 DIAGNOSIS — Z124 Encounter for screening for malignant neoplasm of cervix: Secondary | ICD-10-CM

## 2017-04-29 NOTE — Progress Notes (Signed)
Outgoing call to Dr. Bary Leriche office to inquire about the patient's medical clearance. Patient was seen today by Dr. Nicki Reaper. Staff replied that the clearance form would be filled out and signed in the morning (which is the day of surgery). Form to be faxed to same day surgery ([phone]).

## 2017-04-29 NOTE — Telephone Encounter (Signed)
Copied from [employer] [id number]. Topic: Inquiry >> Apr 29, 2017  4:08 PM Conception Chancy, NT wrote: Reason for CRM: Langley Gauss from [facility] faxed over a paper that needs to be filled out today considering the pt will have a procedure there tomorrow. Langley Gauss can be contacted at [phone]

## 2017-04-29 NOTE — Assessment & Plan Note (Addendum)
Physical today 11//14/18.  PAP 04/29/12 - negative with negative HPV.  Repeat pap today 04/29/17.  Mammogram 09/15/16 - Birads I.  colonoscopy 01/28/16.  Recommended f/u colonoscopy in five years.

## 2017-04-29 NOTE — Telephone Encounter (Signed)
Had Langley Gauss refax form that patient needs signed for surgery tomorrow. Her surgery is at 2:00. Placed on your desk to sign and have [place] fax back to [facility].

## 2017-04-29 NOTE — Progress Notes (Signed)
Patient ID: Savannah Cox, female   DOB: 10/24/1945, 72 y.o.   MRN: [id number]   Subjective:    Patient ID: Savannah Cox, female    DOB: 1946-04-09, 71 y.o.   MRN: [id number]  HPI  Patient here for her physical exam.  Also needs pre op clearance.  She reports she is doing relatively well.  She is planning to have surgery on her finger.  Had pre op EKG.  Reviewed.  Some changes noted.  Needs clearance.  States she is staying active.  Still exercises.  No chest pain.  Has noticed getting a little more winded with exertion when compared to previous - over the past year.  No acute sob.  No chest congestion.  No acid reflux.  No abdominal pain.  Bowels moving.  No urine change.  Overall she feels she is doing relatively well.     Past Medical History:  Diagnosis Date  . Allergic state   . Anemia   . Complication of anesthesia   . Diverticulosis   . GERD (gastroesophageal reflux disease)   . History of hiatal hernia   . History of kidney stones   . Hypercholesterolemia   . Hypertension   . Hypothyroidism   . Osteoporosis, postmenopausal   . PONV (postoperative nausea and vomiting)    nauseated with colonoscopy  . Shingles    Past Surgical History:  Procedure Laterality Date  . BREAST BIOPSY     left, benign  . COLONOSCOPY WITH PROPOFOL N/A 01/28/2016   Procedure: COLONOSCOPY WITH PROPOFOL;  Surgeon: Lollie Sails, MD;  Location: [facility]COPY;  Service: Endoscopy;  Laterality: N/A;  . EYE SURGERY Bilateral 2010   cataracts   Family History  Problem Relation Age of Onset  . Heart disease Father        died age 100 - myocardial infarction  . Hypercholesterolemia Mother   . Arthritis Mother   . Kidney cancer Unknown        aunt  . Breast cancer Maternal Aunt   . Colon cancer Neg Hx    Social History   Socioeconomic History  . Marital status: Married    Spouse name: None  . Number of children: 3  . Years of education: 20  . Highest education level: None  Social  Needs  . Financial resource strain: None  . Food insecurity - worry: None  . Food insecurity - inability: None  . Transportation needs - medical: None  . Transportation needs - non-medical: None  Occupational History  . Occupation: retired Product manager: [employer]  Tobacco Use  . Smoking status: Former Smoker    Last attempt to quit: 1990    Years since quitting: 28.8  . Smokeless tobacco: Never Used  Substance and Sexual Activity  . Alcohol use: Yes    Alcohol/week: 0.0 oz    Comment: Rare drink, one drink per month  . Drug use: No  . Sexual activity: Yes  Other Topics Concern  . None  Social History Narrative  . None    Outpatient Encounter Medications as of 04/29/2017  Medication Sig  . levothyroxine (SYNTHROID, LEVOTHROID) 50 MCG tablet TAKE 1 TABLET (50 MCG TOTAL) BY MOUTH DAILY.  Marland Kitchen lisinopril-hydrochlorothiazide (PRINZIDE,ZESTORETIC) 10-12.5 MG tablet TAKE 1 TABLET BY MOUTH DAILY.  Marland Kitchen loratadine (CLARITIN) 10 MG tablet Take 10 mg by mouth daily.   . Multiple Vitamins-Minerals (CENTRUM SILVER ULTRA WOMENS PO) Take 1 tablet by mouth daily.   Marland Kitchen omeprazole (PRILOSEC)  20 MG capsule TAKE 1 CAPSULE (20 MG TOTAL) BY MOUTH DAILY. (Patient taking differently: Take 20 mg by mouth every evening. )  . rosuvastatin (CRESTOR) 5 MG tablet TAKE 1 TABLET BY MOUTH ONCE A DAY (Patient taking differently: TAKE 1 TABLET BY MOUTH ON MONDAY, WEDNESDAY AND FRIDAYS)  . sertraline (ZOLOFT) 50 MG tablet TAKE 1 TABLET (50 MG TOTAL) BY MOUTH DAILY.   No facility-administered encounter medications on file as of 04/29/2017.     Review of Systems  Constitutional: Negative for appetite change and unexpected weight change.  HENT: Negative for congestion and sinus pressure.   Eyes: Negative for pain and visual disturbance.  Respiratory: Negative for cough and chest tightness.        Notices getting a little more winded with exertion.    Cardiovascular: Negative for chest pain, palpitations and leg  swelling.  Gastrointestinal: Negative for abdominal pain, diarrhea, nausea and vomiting.  Genitourinary: Negative for difficulty urinating and dysuria.  Musculoskeletal: Negative for back pain and joint swelling.  Skin: Negative for color change and rash.  Neurological: Negative for dizziness, light-headedness and headaches.  Hematological: Negative for adenopathy. Does not bruise/bleed easily.  Psychiatric/Behavioral: Negative for agitation and dysphoric mood.       Objective:    Physical Exam  Constitutional: She is oriented to person, place, and time. She appears well-developed and well-nourished. No distress.  HENT:  Nose: Nose normal.  Mouth/Throat: Oropharynx is clear and moist.  Eyes: Right eye exhibits no discharge. Left eye exhibits no discharge. No scleral icterus.  Neck: Neck supple. No thyromegaly present.  Cardiovascular: Normal rate and regular rhythm.  Pulmonary/Chest: Breath sounds normal. No accessory muscle usage. No tachypnea. No respiratory distress. She has no decreased breath sounds. She has no wheezes. She has no rhonchi. Right breast exhibits no inverted nipple, no mass, no nipple discharge and no tenderness (no axillary adenopathy). Left breast exhibits no inverted nipple, no mass, no nipple discharge and no tenderness (no axilarry adenopathy).  Abdominal: Soft. Bowel sounds are normal. There is no tenderness.  Genitourinary:  Genitourinary Comments: Normal external genitalia.  Vaginal vault without lesions.  Cervix identified. Atrophy changes noted.   Pap smear performed.  Could not appreciate any adnexal masses or tenderness.    Musculoskeletal: She exhibits no edema or tenderness.  Lymphadenopathy:    She has no cervical adenopathy.  Neurological: She is alert and oriented to person, place, and time.  Skin: Skin is warm. No rash noted. No erythema.  Psychiatric: She has a normal mood and affect. Her behavior is normal.    BP 110/60 (BP Location: Left Arm,  Patient Position: Sitting, Cuff Size: Normal)   Pulse 70   Temp 98.6 F (37 C) (Oral)   Resp 14   Ht 5\' 3"  (1.6 m)   Wt 145 lb 9.6 oz (66 kg)   SpO2 95%   BMI 25.79 kg/m  Wt Readings from Last 3 Encounters:  04/29/17 145 lb 9.6 oz (66 kg)  04/20/17 145 lb (65.8 kg)  03/23/17 144 lb 3.2 oz (65.4 kg)     Lab Results  Component Value Date   WBC 4.1 04/27/2017   HGB 14.0 04/27/2017   HCT 42.2 04/27/2017   PLT 236.0 04/27/2017   GLUCOSE 99 04/27/2017   CHOL 238 (H) 04/27/2017   TRIG 132.0 04/27/2017   HDL 72.20 04/27/2017   LDLDIRECT 165.9 05/11/2013   LDLCALC 140 (H) 04/27/2017   ALT 19 04/27/2017   AST 31 04/27/2017  NA 140 04/27/2017   K 3.7 04/27/2017   CL 103 04/27/2017   CREATININE 0.75 04/27/2017   BUN 17 04/27/2017   CO2 30 04/27/2017   TSH 4.63 (H) 04/27/2017   INR 0.96 04/14/2016       Assessment & Plan:   Problem List Items Addressed This Visit    Abnormal EKG - Primary    Recent EKG reviewed.  Revealed delayed R wave progression throughout the precordium.  She stays active.  Has noticed getting a little more winded with exertion.  Given EKG changes, given symptoms, I feel she needs cardiology evaluation prior to her surgery.  Pt in agreement.  Referred to cardiology for evaluation today.        Relevant Orders   EKG 12-Lead (Completed)   Ambulatory referral to Cardiology   GERD (gastroesophageal reflux disease)    Controlled on omeprazole.        Health care maintenance    Physical today 11//14/18.  PAP 04/29/12 - negative with negative HPV.  Repeat pap today 04/29/17.  Mammogram 09/15/16 - Birads I.  colonoscopy 01/28/16.  Recommended f/u colonoscopy in five years.        Hypercholesterolemia    Cholesterol is elevated.  LDL 140s.  Stable from previous check.  Low cholesterol diet and exercise.  Will hold on starting medication.  Wants to have the surgery and then f/u with making changes.        Hypertension    Blood pressure under good control.   Continue same medication regimen.  Follow pressures.  Follow metabolic panel.        Hypothyroidism    On thyroid replacement.  TSH slightly elevated.  Discussed with her today.  Will continue synthroid - taking properly.  Follow tsh.       Pre-op evaluation    Planning for finger surgery.  EKG as outlined.  Given changes on EKG and given symptoms, I recommend cardiology evaluation prior to her surgery.  Cardiology to see pt today for cardiac evaluation and risk assessment.  She will also need close intra op and post op monitoring of her heart rate and blood pressure to avoid extremes.         Other Visit Diagnoses    Screening for cervical cancer       Relevant Orders   Cytology - PAP   SOB (shortness of breath)       Relevant Orders   Ambulatory referral to Cardiology   Routine general medical examination at a health care facility           Einar Pheasant, MD

## 2017-04-30 ENCOUNTER — Encounter: Payer: Self-pay | Admitting: Emergency Medicine

## 2017-04-30 ENCOUNTER — Ambulatory Visit: Payer: Medicare Other | Admitting: Anesthesiology

## 2017-04-30 ENCOUNTER — Encounter
Admission: RE | Disposition: A | Discharge status: Home / Self Care | Payer: Self-pay | Source: Ambulatory Visit | Attending: Orthopedic Surgery

## 2017-04-30 ENCOUNTER — Encounter: Payer: Self-pay | Admitting: Internal Medicine

## 2017-04-30 ENCOUNTER — Ambulatory Visit
Admission: RE | Admit: 2017-04-30 | Discharge: 2017-04-30 | Disposition: A | Discharge status: Home / Self Care | Payer: Medicare Other | Source: Ambulatory Visit | Attending: Orthopedic Surgery | Admitting: Orthopedic Surgery

## 2017-04-30 DIAGNOSIS — M25841 Other specified joint disorders, right hand: Secondary | ICD-10-CM | POA: Insufficient documentation

## 2017-04-30 DIAGNOSIS — E039 Hypothyroidism, unspecified: Secondary | ICD-10-CM | POA: Insufficient documentation

## 2017-04-30 DIAGNOSIS — Z01818 Encounter for other preprocedural examination: Secondary | ICD-10-CM | POA: Insufficient documentation

## 2017-04-30 DIAGNOSIS — R9431 Abnormal electrocardiogram [ECG] [EKG]: Secondary | ICD-10-CM | POA: Insufficient documentation

## 2017-04-30 DIAGNOSIS — Z87891 Personal history of nicotine dependence: Secondary | ICD-10-CM | POA: Diagnosis not present

## 2017-04-30 DIAGNOSIS — K219 Gastro-esophageal reflux disease without esophagitis: Secondary | ICD-10-CM | POA: Diagnosis not present

## 2017-04-30 HISTORY — PX: EXCISION METACARPAL MASS: SHX6372

## 2017-04-30 LAB — CYTOLOGY - PAP
DIAGNOSIS: NEGATIVE
HPV (WINDOPATH): NOT DETECTED

## 2017-04-30 SURGERY — EXCISION METACARPAL MASS
Anesthesia: General | Site: Hand | Laterality: Right | Wound class: Clean

## 2017-04-30 MED ORDER — OXYCODONE HCL 5 MG/5ML PO SOLN: 5.0000 mg | Freq: Once | ORAL | Status: DC | PRN

## 2017-04-30 MED ORDER — BUPIVACAINE HCL (PF) 0.5 % IJ SOLN
INTRAMUSCULAR | Status: DC | PRN
  Administered 2017-04-30: 10 mL

## 2017-04-30 MED ORDER — FENTANYL CITRATE (PF) 100 MCG/2ML IJ SOLN: 25.0000 ug | INTRAMUSCULAR | Status: DC | PRN

## 2017-04-30 MED ORDER — DEXAMETHASONE SODIUM PHOSPHATE 10 MG/ML IJ SOLN
INTRAMUSCULAR | Status: DC | PRN
  Administered 2017-04-30: 10 mg via INTRAVENOUS

## 2017-04-30 MED ORDER — ONDANSETRON HCL 4 MG/2ML IJ SOLN
INTRAMUSCULAR | Status: DC | PRN
  Administered 2017-04-30: 4 mg via INTRAVENOUS

## 2017-04-30 MED ORDER — PROPOFOL 10 MG/ML IV BOLUS
INTRAVENOUS | Status: DC | PRN
  Administered 2017-04-30: 120 mg via INTRAVENOUS
  Administered 2017-04-30: 40 mg via INTRAVENOUS

## 2017-04-30 MED ORDER — NEOMYCIN-POLYMYXIN B GU 40-200000 IR SOLN
Status: DC | PRN
  Administered 2017-04-30: 2 mL

## 2017-04-30 MED ORDER — LIDOCAINE HCL (CARDIAC) 20 MG/ML IV SOLN
INTRAVENOUS | Status: DC | PRN
  Administered 2017-04-30: 100 mg via INTRAVENOUS

## 2017-04-30 MED ORDER — LACTATED RINGERS IV SOLN
INTRAVENOUS | Status: DC
  Administered 2017-04-30 (×2): via INTRAVENOUS

## 2017-04-30 MED ORDER — FENTANYL CITRATE (PF) 100 MCG/2ML IJ SOLN
INTRAMUSCULAR | Status: AC
  Filled 2017-04-30: qty 2

## 2017-04-30 MED ORDER — OXYCODONE HCL 5 MG PO TABS: 5.0000 mg | ORAL_TABLET | Freq: Once | ORAL | Status: DC | PRN

## 2017-04-30 MED ORDER — PROPOFOL 10 MG/ML IV BOLUS
INTRAVENOUS | Status: AC
  Filled 2017-04-30: qty 20

## 2017-04-30 MED ORDER — FENTANYL CITRATE (PF) 100 MCG/2ML IJ SOLN
INTRAMUSCULAR | Status: DC | PRN
  Administered 2017-04-30 (×2): 50 ug via INTRAVENOUS

## 2017-04-30 MED ORDER — HYDROCODONE-ACETAMINOPHEN 5-325 MG PO TABS: 1.0000 | ORAL_TABLET | Freq: Four times a day (QID) | ORAL | 0 refills | Status: DC | PRN

## 2017-04-30 MED ORDER — MIDAZOLAM HCL 2 MG/2ML IJ SOLN
INTRAMUSCULAR | Status: AC
  Filled 2017-04-30: qty 2

## 2017-04-30 MED ORDER — MIDAZOLAM HCL 2 MG/2ML IJ SOLN
INTRAMUSCULAR | Status: DC | PRN
  Administered 2017-04-30: 2 mg via INTRAVENOUS

## 2017-04-30 SURGICAL SUPPLY — 21 items
CANISTER SUCT 1200ML W/VALVE (MISCELLANEOUS) ×3 IMPLANT
CUFF TOURN 18 STER (MISCELLANEOUS) IMPLANT
ELECT REM PT RETURN 9FT ADLT (ELECTROSURGICAL) ×3
ELECTRODE REM PT RTRN 9FT ADLT (ELECTROSURGICAL) ×1 IMPLANT
GAUZE PETRO XEROFOAM 1X8 (MISCELLANEOUS) ×3 IMPLANT
GAUZE SPONGE 4X4 12PLY STRL (GAUZE/BANDAGES/DRESSINGS) ×3 IMPLANT
GAUZE STRETCH 2X75IN STRL (MISCELLANEOUS) ×3 IMPLANT
GLOVE SURG SYN 9.0  PF PI (GLOVE) ×2
GLOVE SURG SYN 9.0 PF PI (GLOVE) ×1 IMPLANT
GOWN SRG 2XL LVL 4 RGLN SLV (GOWNS) ×1 IMPLANT
GOWN STRL NON-REIN 2XL LVL4 (GOWNS) ×2
GOWN STRL REUS W/ TWL LRG LVL3 (GOWN DISPOSABLE) ×1 IMPLANT
GOWN STRL REUS W/TWL LRG LVL3 (GOWN DISPOSABLE) ×2
KIT RM TURNOVER STRD PROC AR (KITS) ×3 IMPLANT
NEEDLE HYPO 25X1 1.5 SAFETY (NEEDLE) ×3 IMPLANT
NS IRRIG 500ML POUR BTL (IV SOLUTION) ×3 IMPLANT
PACK EXTREMITY ARMC (MISCELLANEOUS) ×3 IMPLANT
STOCKINETTE STRL 6IN 960660 (GAUZE/BANDAGES/DRESSINGS) ×3 IMPLANT
SUT ETHILON 4-0 (SUTURE) ×2
SUT ETHILON 4-0 FS2 18XMFL BLK (SUTURE) ×1
SUTURE ETHLN 4-0 FS2 18XMF BLK (SUTURE) ×1 IMPLANT

## 2017-04-30 NOTE — H&P (Signed)
Reviewed paper H+P, will be scanned into chart. No changes noted.  

## 2017-04-30 NOTE — Assessment & Plan Note (Signed)
Blood pressure under good control.  Continue same medication regimen.  Follow pressures.  Follow metabolic panel.   

## 2017-04-30 NOTE — Discharge Instructions (Addendum)
Keep arm elevated today and tomorrow is much as possible. Okay to use the hand as tolerated. Keep dressing clean and dry. Pain medicine as directed.   AMBULATORY SURGERY  DISCHARGE INSTRUCTIONS   1) The drugs that you were given will stay in your system until tomorrow so for the next 24 hours you should not:  A) Drive an automobile B) Make any legal decisions C) Drink any alcoholic beverage   2) You may resume regular meals tomorrow.  Today it is better to start with liquids and gradually work up to solid foods.  You may eat anything you prefer, but it is better to start with liquids, then soup and crackers, and gradually work up to solid foods.   3) Please notify your doctor immediately if you have any unusual bleeding, trouble breathing, redness and pain at the surgery site, drainage, fever, or pain not relieved by medication.   4) Additional Instructions:

## 2017-04-30 NOTE — Anesthesia Procedure Notes (Signed)
Procedure Name: LMA Insertion Date/Time: 04/30/2017 3:51 PM Performed by: Nelda Marseille, CRNA Pre-anesthesia Checklist: Patient identified, Patient being monitored, Timeout performed, Emergency Drugs available and Suction available Patient Re-evaluated:Patient Re-evaluated prior to induction Oxygen Delivery Method: Circle system utilized Preoxygenation: Pre-oxygenation with 100% oxygen Induction Type: IV induction Ventilation: Mask ventilation without difficulty LMA: LMA inserted LMA Size: 3.5 Tube type: Oral Number of attempts: 1 Placement Confirmation: positive ETCO2 and breath sounds checked- equal and bilateral Tube secured with: Tape Dental Injury: Teeth and Oropharynx as per pre-operative assessment

## 2017-04-30 NOTE — Anesthesia Preprocedure Evaluation (Signed)
Anesthesia Evaluation  Patient identified by MRN, date of birth, ID band Patient awake    Reviewed: Allergy & Precautions, H&P , NPO status , Patient's Chart, lab work & pertinent test results  History of Anesthesia Complications (+) PONV and history of anesthetic complications  Airway Mallampati: III  TM Distance: <3 FB Neck ROM: limited    Dental  (+) Chipped, Poor Dentition, Caps   Pulmonary neg shortness of breath, former smoker,           Cardiovascular Exercise Tolerance: Good hypertension, (-) angina(-) Past MI and (-) DOE      Neuro/Psych  Headaches, negative psych ROS   GI/Hepatic Neg liver ROS, hiatal hernia, GERD  Medicated and Controlled,  Endo/Other  Hypothyroidism   Renal/GU      Musculoskeletal   Abdominal   Peds  Hematology negative hematology ROS (+)   Anesthesia Other Findings Past Medical History: No date: Allergic state No date: Anemia No date: Complication of anesthesia No date: Diverticulosis No date: GERD (gastroesophageal reflux disease) No date: History of hiatal hernia No date: History of kidney stones No date: Hypercholesterolemia No date: Hypertension No date: Hypothyroidism No date: Osteoporosis, postmenopausal No date: PONV (postoperative nausea and vomiting)     Comment:  nauseated with colonoscopy No date: Shingles  Past Surgical History: No date: BREAST BIOPSY     Comment:  left, benign 01/28/2016: COLONOSCOPY WITH PROPOFOL; N/A     Comment:  Procedure: COLONOSCOPY WITH PROPOFOL;  Surgeon: Lollie Sails, MD;  Location: [facility] ENDOSCOPY;  Service:               Endoscopy;  Laterality: N/A; 2010: EYE SURGERY; Bilateral     Comment:  cataracts  BMI    Body Mass Index:  25.69 kg/m      Reproductive/Obstetrics negative OB ROS                             Anesthesia Physical Anesthesia Plan  ASA: III  Anesthesia Plan: General  and General LMA   Post-op Pain Management:    Induction: Intravenous  PONV Risk Score and Plan: 4 or greater and Ondansetron, Dexamethasone and Midazolam  Airway Management Planned: LMA  Additional Equipment:   Intra-op Plan:   Post-operative Plan: Extubation in OR  Informed Consent: I have reviewed the patients History and Physical, chart, labs and discussed the procedure including the risks, benefits and alternatives for the proposed anesthesia with the patient or authorized representative who has indicated his/her understanding and acceptance.   Dental Advisory Given  Plan Discussed with: Anesthesiologist, CRNA and Surgeon  Anesthesia Plan Comments: (Patient consented for risks of anesthesia including but not limited to:  - adverse reactions to medications - damage to teeth, lips or other oral mucosa - sore throat or hoarseness - Damage to heart, brain, lungs or loss of life  Patient voiced understanding.)        Anesthesia Quick Evaluation

## 2017-04-30 NOTE — Assessment & Plan Note (Signed)
Recent EKG reviewed.  Revealed delayed R wave progression throughout the precordium.  She stays active.  Has noticed getting a little more winded with exertion.  Given EKG changes, given symptoms, I feel she needs cardiology evaluation prior to her surgery.  Pt in agreement.  Referred to cardiology for evaluation today.

## 2017-04-30 NOTE — Telephone Encounter (Signed)
Form faxed with notes.

## 2017-04-30 NOTE — Transfer of Care (Signed)
Immediate Anesthesia Transfer of Care Note  Patient: Savannah Cox  Procedure(s) Performed: EXCISION MUCOID CYST FIFTH FINGER RIGHT HAND (Right Hand)  Patient Location: PACU  Anesthesia Type:General  Level of Consciousness: sedated  Airway & Oxygen Therapy: Patient Spontanous Breathing and Patient connected to face mask oxygen  Post-op Assessment: Report given to RN and Post -op Vital signs reviewed and stable  Post vital signs: Reviewed and stable  Last Vitals:  Vitals:   04/30/17 1356  BP: 137/72  Pulse: 85  Resp: 15  Temp: 36.7 C  SpO2: 95%    Last Pain:  Vitals:   04/30/17 1356  TempSrc: Oral         Complications: No apparent anesthesia complications

## 2017-04-30 NOTE — Assessment & Plan Note (Signed)
Planning for finger surgery.  EKG as outlined.  Given changes on EKG and given symptoms, I recommend cardiology evaluation prior to her surgery.  Cardiology to see pt today for cardiac evaluation and risk assessment.  She will also need close intra op and post op monitoring of her heart rate and blood pressure to avoid extremes.

## 2017-04-30 NOTE — Assessment & Plan Note (Addendum)
On thyroid replacement.  TSH slightly elevated.  Discussed with her today.  Will continue synthroid - taking properly.  Follow tsh.

## 2017-04-30 NOTE — Telephone Encounter (Signed)
Form completed.  Please also fax my office note and cardiology office note from 04/29/17.

## 2017-04-30 NOTE — Assessment & Plan Note (Signed)
Controlled on omeprazole.   

## 2017-04-30 NOTE — Anesthesia Post-op Follow-up Note (Signed)
Anesthesia QCDR form completed.        

## 2017-04-30 NOTE — Assessment & Plan Note (Signed)
Cholesterol is elevated.  LDL 140s.  Stable from previous check.  Low cholesterol diet and exercise.  Will hold on starting medication.  Wants to have the surgery and then f/u with making changes.

## 2017-04-30 NOTE — Op Note (Signed)
04/30/2017  4:05 PM  PATIENT:  Savannah Cox  71 y.o. female  PRE-OPERATIVE DIAGNOSIS:  PAIN IN JOINTS OF RIGHT HAND mucous cyst proximal phalanx right little finger  POST-OPERATIVE DIAGNOSIS:  PAIN IN JOINTS OF RIGHT HAND same  PROCEDURE:  Procedure(s): EXCISION MUCOID CYST FIFTH FINGER RIGHT HAND (Right)  SURGEON: Laurene Footman, MD  ASSISTANTS: None  ANESTHESIA:   general  EBL:  No intake/output data recorded.  BLOOD ADMINISTERED:none  DRAINS: none   LOCAL MEDICATIONS USED:  MARCAINE     SPECIMEN:  No Specimen  DISPOSITION OF SPECIMEN:  N/A  COUNTS:  YES  TOURNIQUET:  * Missing tourniquet times found for documented tourniquets in log: [id number] *7 minutes at 250 mmHg  IMPLANTS: None  DICTATION: .Dragon Dictation patient brought the operating room and after adequate anesthesia was obtained the right arm was prepped and draped in sterile fashion. After patient identification and timeout procedures were completed, tourniquet was raised. A mid axial incision was made on the radial side of the little finger over the middle to proximal phalanx. After this meniscus separating the skin the cyst was directly under the skin separating this out again the cyst was opened exposed and clearly capable the dorsum of the PIP joint the cyst was excised and entering the joint there is a spur on the dorsum of the proximal phalanx distally and middle phalanx proximally a small rongeur and curette was used to debride this following this the wound was irrigated and the wound was closed with simple interrupted 4-0 nylon skin sutures sterile dressing of Xeroform 2 x 2's and conformer dressing applied at the start of the case 10 cc of half percent Sensorcaine been infiltrated in this a digital block to aid in postop analgesia  PLAN OF CARE: Discharge to home after PACU  PATIENT DISPOSITION:  PACU - hemodynamically stable.

## 2017-05-01 ENCOUNTER — Encounter: Payer: Self-pay | Admitting: Orthopedic Surgery

## 2017-05-01 ENCOUNTER — Other Ambulatory Visit: Payer: Self-pay | Admitting: Internal Medicine

## 2017-05-01 ENCOUNTER — Encounter: Payer: Self-pay | Admitting: Internal Medicine

## 2017-05-01 NOTE — Anesthesia Postprocedure Evaluation (Signed)
Anesthesia Post Note  Patient: Jacklyn Branan  Procedure(s) Performed: EXCISION MUCOID CYST FIFTH FINGER RIGHT HAND (Right Hand)  Patient location during evaluation: PACU Anesthesia Type: General Level of consciousness: awake and alert Pain management: pain level controlled Vital Signs Assessment: post-procedure vital signs reviewed and stable Respiratory status: spontaneous breathing, nonlabored ventilation, respiratory function stable and patient connected to nasal cannula oxygen Cardiovascular status: blood pressure returned to baseline and stable Postop Assessment: no apparent nausea or vomiting Anesthetic complications: no     Last Vitals:  Vitals:   04/30/17 1650 04/30/17 1723  BP: 131/65 122/63  Pulse: 68 71  Resp: 18 18  Temp: 36.6 C   SpO2: 96% 96%    Last Pain:  Vitals:   04/30/17 1723  TempSrc:   PainSc: 0-No pain                 Precious Haws Piscitello

## 2017-05-06 ENCOUNTER — Other Ambulatory Visit: Payer: Self-pay

## 2017-05-06 MED ORDER — ROSUVASTATIN CALCIUM 5 MG PO TABS: 5.0000 mg | ORAL_TABLET | Freq: Every day | ORAL | 1 refills | Status: DC

## 2017-06-02 ENCOUNTER — Other Ambulatory Visit: Payer: Self-pay | Admitting: Internal Medicine

## 2017-07-02 ENCOUNTER — Ambulatory Visit: Payer: Medicare Other | Admitting: Internal Medicine

## 2017-07-02 ENCOUNTER — Encounter: Payer: Self-pay | Admitting: Internal Medicine

## 2017-07-02 DIAGNOSIS — I10 Essential (primary) hypertension: Secondary | ICD-10-CM

## 2017-07-02 DIAGNOSIS — E039 Hypothyroidism, unspecified: Secondary | ICD-10-CM

## 2017-07-02 DIAGNOSIS — M79644 Pain in right finger(s): Secondary | ICD-10-CM

## 2017-07-02 DIAGNOSIS — E7800 Pure hypercholesterolemia, unspecified: Secondary | ICD-10-CM

## 2017-07-02 DIAGNOSIS — M25512 Pain in left shoulder: Secondary | ICD-10-CM | POA: Diagnosis not present

## 2017-07-02 DIAGNOSIS — R109 Unspecified abdominal pain: Secondary | ICD-10-CM | POA: Diagnosis not present

## 2017-07-02 DIAGNOSIS — K219 Gastro-esophageal reflux disease without esophagitis: Secondary | ICD-10-CM

## 2017-07-02 NOTE — Progress Notes (Addendum)
Patient ID: Savannah Cox, female   DOB: March 27, 1946, 72 y.o.   MRN: [id number]   Subjective:    Patient ID: Savannah Cox, female    DOB: [birth year], 72 y.o.   MRN: [id number]  HPI  Patient here for a scheduled follow up.  She is s/p excision of mucoid cyst of right fifth finger.  Still with increased pain, swelling and limited flexion.  Desires referral back to ortho.  Also having increased pain left shoulder.  Increased pain with certain movements and positions.  Feels like it catches.  Has been persistent.  Unable to take an increased amount of antiinflammatories.  Stays active.  No chest pain.  No sob.  No acid reflux. Does report persistent intermittent abdominal pain and discomfort.  Has seen GI previously.  Had colonoscopy 2017.  Had CT in august.  Still with issues.  Discussed referral back to GI.  She is in agreement.  Bowels are moving.     Past Medical History:  Diagnosis Date  . Allergic state   . Anemia   . Complication of anesthesia   . Diverticulosis   . GERD (gastroesophageal reflux disease)   . History of hiatal hernia   . History of kidney stones   . Hypercholesterolemia   . Hypertension   . Hypothyroidism   . Osteoporosis, postmenopausal   . PONV (postoperative nausea and vomiting)    nauseated with colonoscopy  . Shingles    Past Surgical History:  Procedure Laterality Date  . BREAST BIOPSY     left, benign  . COLONOSCOPY WITH PROPOFOL N/A 01/28/2016   Procedure: COLONOSCOPY WITH PROPOFOL;  Surgeon: Lollie Sails, MD;  Location: [facility] ENDOSCOPY;  Service: Endoscopy;  Laterality: N/A;  . EXCISION METACARPAL MASS Right 04/30/2017   Procedure: EXCISION MUCOID CYST FIFTH FINGER RIGHT HAND;  Surgeon: Hessie Knows, MD;  Location: [facility] ORS;  Service: Orthopedics;  Laterality: Right;  . EYE SURGERY Bilateral 2010   cataracts   Family History  Problem Relation Age of Onset  . Heart disease Father        died age 66 - myocardial infarction  .  Hypercholesterolemia Mother   . Arthritis Mother   . Kidney cancer Unknown        aunt  . Breast cancer Maternal Aunt   . Colon cancer Neg Hx    Social History   Socioeconomic History  . Marital status: Married    Spouse name: None  . Number of children: 3  . Years of education: 58  . Highest education level: None  Social Needs  . Financial resource strain: None  . Food insecurity - worry: None  . Food insecurity - inability: None  . Transportation needs - medical: None  . Transportation needs - non-medical: None  Occupational History  . Occupation: retired Product manager: RETIRED  Tobacco Use  . Smoking status: Former Smoker    Last attempt to quit: 1990    Years since quitting: 29.0  . Smokeless tobacco: Never Used  Substance and Sexual Activity  . Alcohol use: Yes    Alcohol/week: 0.0 oz    Comment: Rare drink, one drink per month  . Drug use: No  . Sexual activity: Yes  Other Topics Concern  . None  Social History Narrative  . None    Outpatient Encounter Medications as of 07/02/2017  Medication Sig  . levothyroxine (SYNTHROID, LEVOTHROID) 50 MCG tablet TAKE 1 TABLET (50 MCG TOTAL) BY MOUTH DAILY.  Marland Kitchen  lisinopril-hydrochlorothiazide (PRINZIDE,ZESTORETIC) 10-12.5 MG tablet TAKE 1 TABLET BY MOUTH EVERY DAY  . loratadine (CLARITIN) 10 MG tablet Take 10 mg by mouth daily.   . Multiple Vitamins-Minerals (CENTRUM SILVER ULTRA WOMENS PO) Take 1 tablet by mouth daily.   . rosuvastatin (CRESTOR) 5 MG tablet Take 1 tablet (5 mg total) by mouth daily. (Patient taking differently: Take 5 mg by mouth daily. Patient taking Monday, Wednesday, Friday)  . sertraline (ZOLOFT) 50 MG tablet TAKE 1 TABLET (50 MG TOTAL) BY MOUTH DAILY.  Marland Kitchen triamcinolone lotion (KENALOG) 0.1 % APPLY TO SCALP 1-2 TIMES DAILY AS NEEDED  . [DISCONTINUED] omeprazole (PRILOSEC) 20 MG capsule TAKE 1 CAPSULE (20 MG TOTAL) BY MOUTH DAILY. (Patient taking differently: Take 20 mg by mouth every evening. )  .  [DISCONTINUED] HYDROcodone-acetaminophen (NORCO) 5-325 MG tablet Take 1 tablet every 6 (six) hours as needed by mouth.   No facility-administered encounter medications on file as of 07/02/2017.     Review of Systems  Constitutional: Negative for appetite change and unexpected weight change.  HENT: Negative for congestion and sinus pressure.   Respiratory: Negative for cough, chest tightness and shortness of breath.   Cardiovascular: Negative for chest pain, palpitations and leg swelling.  Gastrointestinal: Positive for abdominal pain. Negative for diarrhea, nausea and vomiting.  Genitourinary: Negative for difficulty urinating and dysuria.  Musculoskeletal:       Left shoulder pain and right fifth finger pain and swelling as outlined.   Skin: Negative for color change and rash.  Neurological: Negative for dizziness, light-headedness and headaches.  Psychiatric/Behavioral: Negative for agitation and dysphoric mood.       Objective:     Blood pressure rechecked by me:  134/78  Physical Exam  Constitutional: She appears well-developed and well-nourished. No distress.  HENT:  Nose: Nose normal.  Mouth/Throat: Oropharynx is clear and moist.  Neck: Neck supple. No thyromegaly present.  Cardiovascular: Normal rate and regular rhythm.  Pulmonary/Chest: Breath sounds normal. No respiratory distress. She has no wheezes.  Abdominal: Soft. Bowel sounds are normal. There is no tenderness.  Musculoskeletal: She exhibits no edema or tenderness.  Increased swelling of right fifth finger.  Increased pain to palpation.  Limited flexion.  Increased pain with flexion.  Increased pain with full extension - and with rotation - left anterior shoulder.   Lymphadenopathy:    She has no cervical adenopathy.  Skin: No rash noted. No erythema.  Psychiatric: She has a normal mood and affect. Her behavior is normal.    BP 116/70 (BP Location: Left Arm, Patient Position: Sitting, Cuff Size: Normal)   Pulse  73   Temp 98.9 F (37.2 C) (Oral)   Resp 16   Wt 144 lb (65.3 kg)   SpO2 95%   BMI 25.51 kg/m  Wt Readings from Last 3 Encounters:  07/02/17 144 lb (65.3 kg)  04/30/17 145 lb (65.8 kg)  04/29/17 145 lb 9.6 oz (66 kg)     Lab Results  Component Value Date   WBC 4.1 04/27/2017   HGB 14.0 04/27/2017   HCT 42.2 04/27/2017   PLT 236.0 04/27/2017   GLUCOSE 99 04/27/2017   CHOL 238 (H) 04/27/2017   TRIG 132.0 04/27/2017   HDL 72.20 04/27/2017   LDLDIRECT 165.9 05/11/2013   LDLCALC 140 (H) 04/27/2017   ALT 19 04/27/2017   AST 31 04/27/2017   NA 140 04/27/2017   K 3.7 04/27/2017   CL 103 04/27/2017   CREATININE 0.75 04/27/2017   BUN 17 04/27/2017  CO2 30 04/27/2017   TSH 4.63 (H) 04/27/2017   INR 0.96 04/14/2016       Assessment & Plan:   Problem List Items Addressed This Visit    Abdominal pain    Persistent intermittent abdominal pain and discomfort.  W/up as outlined.  Has been admitted with colitis.  Has had colonoscopy 2017.  CT - 01/2017.  Given persistent issues, refer back to GI.        Relevant Orders   Ambulatory referral to Gastroenterology   Finger pain, right    Persistent right fifth finger pain and swelling s/p removal cyst.  Refer back to ortho given persistent issues.        Relevant Orders   Ambulatory referral to Orthopedic Surgery   GERD (gastroesophageal reflux disease)    Controlled on current regimen.        Hypercholesterolemia    On crestor.  Tolerating.  Low cholesterol diet and exercise.  Follow lipid panel and liver function tests.        Relevant Orders   Hepatic function panel   Lipid panel   Hypertension    Blood pressure under good control.  Continue same medication regimen.  Follow pressures.  Follow metabolic panel.        Relevant Orders   CBC with Differential/Platelet   Basic metabolic panel   Hypothyroidism    On thyroid replacement.  Follow tsh.       Relevant Orders   TSH    Other Visit Diagnoses    Left  shoulder pain, unspecified chronicity       persistent pain.  unable to take antiinflammatories.  refer to ortho.     Relevant Orders   Ambulatory referral to Orthopedic Surgery       Einar Pheasant, MD

## 2017-07-03 ENCOUNTER — Other Ambulatory Visit: Payer: Self-pay | Admitting: Internal Medicine

## 2017-07-05 ENCOUNTER — Encounter: Payer: Self-pay | Admitting: Internal Medicine

## 2017-07-05 DIAGNOSIS — M79645 Pain in left finger(s): Secondary | ICD-10-CM | POA: Insufficient documentation

## 2017-07-05 DIAGNOSIS — M79644 Pain in right finger(s): Secondary | ICD-10-CM | POA: Insufficient documentation

## 2017-07-05 NOTE — Assessment & Plan Note (Signed)
Persistent right fifth finger pain and swelling s/p removal cyst.  Refer back to ortho given persistent issues.

## 2017-07-05 NOTE — Assessment & Plan Note (Signed)
Persistent intermittent abdominal pain and discomfort.  W/up as outlined.  Has been admitted with colitis.  Has had colonoscopy 2017.  CT - 01/2017.  Given persistent issues, refer back to GI.

## 2017-07-05 NOTE — Assessment & Plan Note (Signed)
On thyroid replacement.  Follow tsh.  

## 2017-07-05 NOTE — Addendum Note (Signed)
Addended by: Alisa Graff on: 07/05/2017 10:17 AM   Modules accepted: Orders

## 2017-07-05 NOTE — Assessment & Plan Note (Signed)
Controlled on current regimen.   

## 2017-07-05 NOTE — Assessment & Plan Note (Signed)
On crestor.  Tolerating.  Low cholesterol diet and exercise.  Follow lipid panel and liver function tests.   

## 2017-07-05 NOTE — Assessment & Plan Note (Signed)
Blood pressure under good control.  Continue same medication regimen.  Follow pressures.  Follow metabolic panel.   

## 2017-08-04 ENCOUNTER — Other Ambulatory Visit: Payer: Self-pay

## 2017-08-04 MED ORDER — LEVOTHYROXINE SODIUM 50 MCG PO TABS: 50.0000 ug | ORAL_TABLET | Freq: Every day | ORAL | 6 refills | Status: DC

## 2017-09-30 ENCOUNTER — Other Ambulatory Visit (INDEPENDENT_AMBULATORY_CARE_PROVIDER_SITE_OTHER): Payer: Medicare Other

## 2017-09-30 DIAGNOSIS — E039 Hypothyroidism, unspecified: Secondary | ICD-10-CM

## 2017-09-30 DIAGNOSIS — I10 Essential (primary) hypertension: Secondary | ICD-10-CM | POA: Diagnosis not present

## 2017-09-30 DIAGNOSIS — E7800 Pure hypercholesterolemia, unspecified: Secondary | ICD-10-CM | POA: Diagnosis not present

## 2017-09-30 LAB — BASIC METABOLIC PANEL
BUN: 17 mg/dL (ref 6–23)
CALCIUM: 9.4 mg/dL (ref 8.4–10.5)
CHLORIDE: 104 meq/L (ref 96–112)
CO2: 27 mEq/L (ref 19–32)
CREATININE: 0.78 mg/dL (ref 0.40–1.20)
GFR: 77.64 mL/min (ref >60.00)
Glucose, Bld: 93 mg/dL (ref 70–99)
Potassium: 3.9 mEq/L (ref 3.5–5.1)
Sodium: 139 mEq/L (ref 135–145)

## 2017-09-30 LAB — LIPID PANEL
CHOLESTEROL: 223 mg/dL — AB (ref 0–200)
HDL: 76.2 mg/dL (ref >39.00)
LDL Cholesterol: 131 mg/dL — ABNORMAL HIGH (ref 0–99)
NONHDL: 147.06
Total CHOL/HDL Ratio: 3
Triglycerides: 81 mg/dL (ref 0.0–149.0)
VLDL: 16.2 mg/dL (ref 0.0–40.0)

## 2017-09-30 LAB — HEPATIC FUNCTION PANEL
ALT: 20 U/L (ref 0–35)
AST: 29 U/L (ref 0–37)
Albumin: 4.2 g/dL (ref 3.5–5.2)
Alkaline Phosphatase: 55 U/L (ref 39–117)
BILIRUBIN DIRECT: 0.1 mg/dL (ref 0.0–0.3)
BILIRUBIN TOTAL: 0.5 mg/dL (ref 0.2–1.2)
Total Protein: 6.8 g/dL (ref 6.0–8.3)

## 2017-09-30 LAB — CBC WITH DIFFERENTIAL/PLATELET
BASOS PCT: 1.9 % (ref 0.0–3.0)
Basophils Absolute: 0.1 10*3/uL (ref 0.0–0.1)
EOS ABS: 0.1 10*3/uL (ref 0.0–0.7)
EOS PCT: 3.4 % (ref 0.0–5.0)
HCT: 40.1 % (ref 36.0–46.0)
Hemoglobin: 13.6 g/dL (ref 12.0–15.0)
LYMPHS ABS: 1.3 10*3/uL (ref 0.7–4.0)
Lymphocytes Relative: 34.9 % (ref 12.0–46.0)
MCHC: 33.9 g/dL (ref 30.0–36.0)
MCV: 97.8 fl (ref 78.0–100.0)
MONO ABS: 0.7 10*3/uL (ref 0.1–1.0)
Monocytes Relative: 18.4 % — ABNORMAL HIGH (ref 3.0–12.0)
Neutro Abs: 1.5 10*3/uL (ref 1.4–7.7)
Neutrophils Relative %: 41.4 % — ABNORMAL LOW (ref 43.0–77.0)
Platelets: 236 10*3/uL (ref 150.0–400.0)
RBC: 4.1 Mil/uL (ref 3.87–5.11)
RDW: 13.6 % (ref 11.5–15.5)
WBC: 3.7 10*3/uL — ABNORMAL LOW (ref 4.0–10.5)

## 2017-09-30 LAB — TSH: TSH: 3.38 u[IU]/mL (ref 0.35–4.50)

## 2017-10-01 ENCOUNTER — Encounter: Payer: Self-pay | Admitting: Internal Medicine

## 2017-10-05 ENCOUNTER — Encounter: Payer: Self-pay | Admitting: Internal Medicine

## 2017-10-05 ENCOUNTER — Ambulatory Visit: Payer: Medicare Other | Admitting: Internal Medicine

## 2017-10-05 VITALS — BP 120/72 | HR 70 | Temp 98.6°F | Resp 18 | Wt 141.6 lb

## 2017-10-05 DIAGNOSIS — D649 Anemia, unspecified: Secondary | ICD-10-CM | POA: Diagnosis not present

## 2017-10-05 DIAGNOSIS — K219 Gastro-esophageal reflux disease without esophagitis: Secondary | ICD-10-CM

## 2017-10-05 DIAGNOSIS — I700 Atherosclerosis of aorta: Secondary | ICD-10-CM

## 2017-10-05 DIAGNOSIS — D72819 Decreased white blood cell count, unspecified: Secondary | ICD-10-CM

## 2017-10-05 DIAGNOSIS — E7800 Pure hypercholesterolemia, unspecified: Secondary | ICD-10-CM

## 2017-10-05 DIAGNOSIS — Z1231 Encounter for screening mammogram for malignant neoplasm of breast: Secondary | ICD-10-CM | POA: Diagnosis not present

## 2017-10-05 DIAGNOSIS — R109 Unspecified abdominal pain: Secondary | ICD-10-CM | POA: Diagnosis not present

## 2017-10-05 DIAGNOSIS — E039 Hypothyroidism, unspecified: Secondary | ICD-10-CM

## 2017-10-05 DIAGNOSIS — I10 Essential (primary) hypertension: Secondary | ICD-10-CM | POA: Diagnosis not present

## 2017-10-05 NOTE — Progress Notes (Signed)
Patient ID: Savannah Cox, female   DOB: June 14, 1946, 72 y.o.   MRN: [id number]   Subjective:    Patient ID: Savannah Cox, female    DOB: May 27, 1946, 72 y.o.   MRN: [id number]  HPI  Patient here for a scheduled follow up. She was evaluated by GI 07/2017 for nausea and abdominal pain.  Note reviewed.  They increased her prilosec.  She reports she is doing better.  No increased pain currently.  Tries to stay active.  No chest pain.  No acid reflux.  No sob.  Bowels moving.  She is walking 2 miles/day. Persistent pain right fifth finger.  Request ortho f/u.  Discussed recent labs.     Past Medical History:  Diagnosis Date  . Allergic state   . Anemia   . Complication of anesthesia   . Diverticulosis   . GERD (gastroesophageal reflux disease)   . History of hiatal hernia   . History of kidney stones   . Hypercholesterolemia   . Hypertension   . Hypothyroidism   . Osteoporosis, postmenopausal   . PONV (postoperative nausea and vomiting)    nauseated with colonoscopy  . Shingles    Past Surgical History:  Procedure Laterality Date  . BREAST BIOPSY     left, benign  . COLONOSCOPY WITH PROPOFOL N/A 01/28/2016   Procedure: COLONOSCOPY WITH PROPOFOL;  Surgeon: Lollie Sails, MD;  Location: [facility] ENDOSCOPY;  Service: Endoscopy;  Laterality: N/A;  . EXCISION METACARPAL MASS Right 04/30/2017   Procedure: EXCISION MUCOID CYST FIFTH FINGER RIGHT HAND;  Surgeon: Hessie Knows, MD;  Location: [facility] ORS;  Service: Orthopedics;  Laterality: Right;  . EYE SURGERY Bilateral 2010   cataracts   Family History  Problem Relation Age of Onset  . Heart disease Father        died age 1 - myocardial infarction  . Hypercholesterolemia Mother   . Arthritis Mother   . Kidney cancer Unknown        aunt  . Breast cancer Maternal Aunt   . Colon cancer Neg Hx    Social History   Socioeconomic History  . Marital status: Married    Spouse name: Not on file  . Number of children: 3  . Years of  education: 46  . Highest education level: Not on file  Occupational History  . Occupation: retired Product manager: RETIRED  Social Needs  . Financial resource strain: Not on file  . Food insecurity:    Worry: Not on file    Inability: Not on file  . Transportation needs:    Medical: Not on file    Non-medical: Not on file  Tobacco Use  . Smoking status: Former Smoker    Last attempt to quit: 1990    Years since quitting: 29.3  . Smokeless tobacco: Never Used  Substance and Sexual Activity  . Alcohol use: Yes    Alcohol/week: 0.0 oz    Comment: Rare drink, one drink per month  . Drug use: No  . Sexual activity: Yes  Lifestyle  . Physical activity:    Days per week: Not on file    Minutes per session: Not on file  . Stress: Not on file  Relationships  . Social connections:    Talks on phone: Not on file    Gets together: Not on file    Attends religious service: Not on file    Active member of club or organization: Not on file  Attends meetings of clubs or organizations: Not on file    Relationship status: Not on file  Other Topics Concern  . Not on file  Social History Narrative  . Not on file    Outpatient Encounter Medications as of 10/05/2017  Medication Sig  . levothyroxine (SYNTHROID, LEVOTHROID) 50 MCG tablet Take 1 tablet (50 mcg total) by mouth daily.  Marland Kitchen lisinopril-hydrochlorothiazide (PRINZIDE,ZESTORETIC) 10-12.5 MG tablet TAKE 1 TABLET BY MOUTH EVERY DAY  . loratadine (CLARITIN) 10 MG tablet Take 10 mg by mouth daily.   . Multiple Vitamins-Minerals (CENTRUM SILVER ULTRA WOMENS PO) Take 1 tablet by mouth daily.   Marland Kitchen omeprazole (PRILOSEC) 40 MG capsule TAKE 1 CAPSULE (40 MG TOTAL) BY MOUTH ONCE DAILY TAKE 30 MINS BEFORE A MEAL  . rosuvastatin (CRESTOR) 5 MG tablet Take 1 tablet (5 mg total) by mouth daily. (Patient taking differently: Take 5 mg by mouth daily. Patient taking Monday, Wednesday, Friday)  . sertraline (ZOLOFT) 50 MG tablet TAKE 1 TABLET (50  MG TOTAL) BY MOUTH DAILY.  Marland Kitchen triamcinolone lotion (KENALOG) 0.1 % APPLY TO SCALP 1-2 TIMES DAILY AS NEEDED  . [DISCONTINUED] omeprazole (PRILOSEC) 20 MG capsule TAKE 1 CAPSULE BY MOUTH EVERY DAY   No facility-administered encounter medications on file as of 10/05/2017.     Review of Systems  Constitutional: Negative for appetite change and unexpected weight change.  HENT: Negative for congestion and sinus pressure.   Respiratory: Negative for cough, chest tightness and shortness of breath.   Cardiovascular: Negative for chest pain, palpitations and leg swelling.  Gastrointestinal: Negative for diarrhea, nausea and vomiting.  Genitourinary: Negative for difficulty urinating and dysuria.  Musculoskeletal: Negative for myalgias.       Increased pain right fifth finger.    Skin: Negative for color change and rash.  Neurological: Negative for dizziness, light-headedness and headaches.  Psychiatric/Behavioral: Negative for agitation and dysphoric mood.       Objective:    Physical Exam  Constitutional: She appears well-developed and well-nourished. No distress.  HENT:  Nose: Nose normal.  Mouth/Throat: Oropharynx is clear and moist.  Neck: Neck supple. No thyromegaly present.  Cardiovascular: Normal rate and regular rhythm.  Pulmonary/Chest: Breath sounds normal. No respiratory distress. She has no wheezes.  Abdominal: Soft. Bowel sounds are normal. There is no tenderness.  Musculoskeletal: She exhibits no edema or tenderness.  Increased pain right fifth finger with associated swelling. No increased erythema.   Lymphadenopathy:    She has no cervical adenopathy.  Skin: No rash noted. No erythema.  Psychiatric: She has a normal mood and affect. Her behavior is normal.    BP 120/72 (BP Location: Left Arm, Patient Position: Sitting, Cuff Size: Normal)   Pulse 70   Temp 98.6 F (37 C) (Oral)   Resp 18   Wt 141 lb 9.6 oz (64.2 kg)   SpO2 94%   BMI 25.08 kg/m  Wt Readings from  Last 3 Encounters:  10/05/17 141 lb 9.6 oz (64.2 kg)  07/02/17 144 lb (65.3 kg)  04/30/17 145 lb (65.8 kg)     Lab Results  Component Value Date   WBC 3.7 (L) 09/30/2017   HGB 13.6 09/30/2017   HCT 40.1 09/30/2017   PLT 236.0 09/30/2017   GLUCOSE 93 09/30/2017   CHOL 223 (H) 09/30/2017   TRIG 81.0 09/30/2017   HDL 76.20 09/30/2017   LDLDIRECT 165.9 05/11/2013   LDLCALC 131 (H) 09/30/2017   ALT 20 09/30/2017   AST 29 09/30/2017   NA  139 09/30/2017   K 3.9 09/30/2017   CL 104 09/30/2017   CREATININE 0.78 09/30/2017   BUN 17 09/30/2017   CO2 27 09/30/2017   TSH 3.38 09/30/2017   INR 0.96 04/14/2016       Assessment & Plan:   Problem List Items Addressed This Visit    Abdominal pain    Recently saw GI. prilosec increased to 40mg  q day.  Overall doing ok now.  Discussed f/u with GI.       Anemia    hgb on recent check wnl.        Aortic atherosclerosis ([place])    On crestor.        GERD (gastroesophageal reflux disease)    No acid reflux on current regimen.  Follow.       Relevant Medications   omeprazole (PRILOSEC) 40 MG capsule   Hypercholesterolemia    On crestor.  Low cholesterol diet and exercise.  Follow lipid panel and liver function tests.        Hypertension    Blood pressure under good control.  Continue same medication regimen.  Follow pressures.  Follow metabolic panel.        Hypothyroidism    On thyroid replacement.  Follow tsh.        Other Visit Diagnoses    Leukopenia, unspecified type    -  Primary   Relevant Orders   CBC with Differential/Platelet   Breast cancer screening by mammogram       Relevant Orders   MM Digital Screening       Einar Pheasant, MD

## 2017-10-10 ENCOUNTER — Encounter: Payer: Self-pay | Admitting: Internal Medicine

## 2017-10-10 DIAGNOSIS — I700 Atherosclerosis of aorta: Secondary | ICD-10-CM | POA: Insufficient documentation

## 2017-10-10 NOTE — Assessment & Plan Note (Signed)
No acid reflux on current regimen.  Follow.

## 2017-10-10 NOTE — Assessment & Plan Note (Signed)
On crestor.   

## 2017-10-10 NOTE — Assessment & Plan Note (Signed)
hgb on recent check wnl.

## 2017-10-10 NOTE — Assessment & Plan Note (Signed)
On crestor.  Low cholesterol diet and exercise.  Follow lipid panel and liver function tests.   

## 2017-10-10 NOTE — Assessment & Plan Note (Signed)
On thyroid replacement.  Follow tsh.  

## 2017-10-10 NOTE — Assessment & Plan Note (Signed)
Blood pressure under good control.  Continue same medication regimen.  Follow pressures.  Follow metabolic panel.   

## 2017-10-10 NOTE — Assessment & Plan Note (Signed)
Recently saw GI. prilosec increased to 40mg  q day.  Overall doing ok now.  Discussed f/u with GI.

## 2017-10-21 ENCOUNTER — Other Ambulatory Visit: Payer: Self-pay | Admitting: Internal Medicine

## 2017-10-21 ENCOUNTER — Ambulatory Visit
Admission: RE | Admit: 2017-10-21 | Discharge: 2017-10-21 | Disposition: A | Discharge status: Home / Self Care | Payer: Medicare Other | Source: Ambulatory Visit | Attending: Internal Medicine | Admitting: Internal Medicine

## 2017-10-21 DIAGNOSIS — N6489 Other specified disorders of breast: Secondary | ICD-10-CM

## 2017-10-21 DIAGNOSIS — Z1231 Encounter for screening mammogram for malignant neoplasm of breast: Secondary | ICD-10-CM | POA: Insufficient documentation

## 2017-10-21 DIAGNOSIS — R928 Other abnormal and inconclusive findings on diagnostic imaging of breast: Secondary | ICD-10-CM

## 2017-10-31 ENCOUNTER — Other Ambulatory Visit: Payer: Self-pay | Admitting: Internal Medicine

## 2017-11-01 ENCOUNTER — Other Ambulatory Visit: Payer: Self-pay | Admitting: Internal Medicine

## 2017-11-02 ENCOUNTER — Other Ambulatory Visit (INDEPENDENT_AMBULATORY_CARE_PROVIDER_SITE_OTHER): Payer: Medicare Other

## 2017-11-02 DIAGNOSIS — D72819 Decreased white blood cell count, unspecified: Secondary | ICD-10-CM | POA: Diagnosis not present

## 2017-11-02 LAB — CBC WITH DIFFERENTIAL/PLATELET
Basophils Absolute: 0.1 10*3/uL (ref 0.0–0.1)
Basophils Relative: 1.5 % (ref 0.0–3.0)
Eosinophils Absolute: 0.2 10*3/uL (ref 0.0–0.7)
Eosinophils Relative: 4.4 % (ref 0.0–5.0)
HEMATOCRIT: 40.5 % (ref 36.0–46.0)
HEMOGLOBIN: 13.6 g/dL (ref 12.0–15.0)
Lymphocytes Relative: 36.5 % (ref 12.0–46.0)
Lymphs Abs: 1.4 10*3/uL (ref 0.7–4.0)
MCHC: 33.7 g/dL (ref 30.0–36.0)
MCV: 97.7 fl (ref 78.0–100.0)
MONO ABS: 0.7 10*3/uL (ref 0.1–1.0)
Monocytes Relative: 17.2 % — ABNORMAL HIGH (ref 3.0–12.0)
Neutro Abs: 1.6 10*3/uL (ref 1.4–7.7)
Neutrophils Relative %: 40.4 % — ABNORMAL LOW (ref 43.0–77.0)
Platelets: 233 10*3/uL (ref 150.0–400.0)
RBC: 4.14 Mil/uL (ref 3.87–5.11)
RDW: 13.2 % (ref 11.5–15.5)
WBC: 4 10*3/uL (ref 4.0–10.5)

## 2017-11-03 ENCOUNTER — Other Ambulatory Visit: Payer: Self-pay | Admitting: Internal Medicine

## 2017-11-03 DIAGNOSIS — D72821 Monocytosis (symptomatic): Secondary | ICD-10-CM

## 2017-11-03 NOTE — Progress Notes (Signed)
Order placed for f/u lab.   

## 2017-11-05 ENCOUNTER — Ambulatory Visit
Admission: RE | Admit: 2017-11-05 | Discharge: 2017-11-05 | Disposition: A | Discharge status: Home / Self Care | Payer: Medicare Other | Source: Ambulatory Visit | Attending: Internal Medicine | Admitting: Internal Medicine

## 2017-11-05 DIAGNOSIS — R928 Other abnormal and inconclusive findings on diagnostic imaging of breast: Secondary | ICD-10-CM | POA: Diagnosis not present

## 2017-11-05 DIAGNOSIS — N6489 Other specified disorders of breast: Secondary | ICD-10-CM

## 2017-12-02 ENCOUNTER — Other Ambulatory Visit (INDEPENDENT_AMBULATORY_CARE_PROVIDER_SITE_OTHER): Payer: Medicare Other

## 2017-12-02 DIAGNOSIS — D72821 Monocytosis (symptomatic): Secondary | ICD-10-CM

## 2017-12-02 LAB — CBC WITH DIFFERENTIAL/PLATELET
BASOS ABS: 0.1 10*3/uL (ref 0.0–0.1)
Basophils Relative: 1.7 % (ref 0.0–3.0)
EOS ABS: 0.2 10*3/uL (ref 0.0–0.7)
Eosinophils Relative: 3.9 % (ref 0.0–5.0)
HEMATOCRIT: 40.1 % (ref 36.0–46.0)
Hemoglobin: 13.8 g/dL (ref 12.0–15.0)
Lymphocytes Relative: 36.6 % (ref 12.0–46.0)
Lymphs Abs: 1.6 10*3/uL (ref 0.7–4.0)
MCHC: 34.4 g/dL (ref 30.0–36.0)
MCV: 97.5 fl (ref 78.0–100.0)
MONOS PCT: 16.3 % — AB (ref 3.0–12.0)
Monocytes Absolute: 0.7 10*3/uL (ref 0.1–1.0)
NEUTROS ABS: 1.8 10*3/uL (ref 1.4–7.7)
Neutrophils Relative %: 41.5 % — ABNORMAL LOW (ref 43.0–77.0)
PLATELETS: 219 10*3/uL (ref 150.0–400.0)
RBC: 4.11 Mil/uL (ref 3.87–5.11)
RDW: 13.4 % (ref 11.5–15.5)
WBC: 4.3 10*3/uL (ref 4.0–10.5)

## 2017-12-04 ENCOUNTER — Encounter: Payer: Self-pay | Admitting: Internal Medicine

## 2018-01-22 ENCOUNTER — Ambulatory Visit: Payer: Medicare Other | Admitting: Internal Medicine

## 2018-01-22 ENCOUNTER — Encounter: Payer: Self-pay | Admitting: Internal Medicine

## 2018-01-22 DIAGNOSIS — E039 Hypothyroidism, unspecified: Secondary | ICD-10-CM

## 2018-01-22 DIAGNOSIS — K219 Gastro-esophageal reflux disease without esophagitis: Secondary | ICD-10-CM

## 2018-01-22 DIAGNOSIS — E7800 Pure hypercholesterolemia, unspecified: Secondary | ICD-10-CM

## 2018-01-22 DIAGNOSIS — D649 Anemia, unspecified: Secondary | ICD-10-CM

## 2018-01-22 DIAGNOSIS — I700 Atherosclerosis of aorta: Secondary | ICD-10-CM | POA: Diagnosis not present

## 2018-01-22 DIAGNOSIS — I10 Essential (primary) hypertension: Secondary | ICD-10-CM

## 2018-01-22 MED ORDER — LEVOTHYROXINE SODIUM 50 MCG PO TABS: 50.0000 ug | ORAL_TABLET | Freq: Every day | ORAL | 1 refills | Status: DC

## 2018-01-22 NOTE — Progress Notes (Signed)
Patient ID: Lezley Bedgood, female   DOB: [birth year], 72 y.o.   MRN: [id number]   Subjective:    Patient ID: Kewana Sanon, female    DOB: 1946-05-21, 72 y.o.   MRN: [id number]  HPI  Patient here for a scheduled follow up.  She previously saw GI for nausea and LLQ pain.  Was placed on 40mg  of omeprazole.  States she is feeling good.  Has lost weight.  Has been - intermitting fasting.  Feels better.  No acid reflux.  No nausea.  No abdominal pain.  Bowels moving.  No chest pain.  No sob.  Overall feels good.     Past Medical History:  Diagnosis Date  . Allergic state   . Anemia   . Complication of anesthesia   . Diverticulosis   . GERD (gastroesophageal reflux disease)   . History of hiatal hernia   . History of kidney stones   . Hypercholesterolemia   . Hypertension   . Hypothyroidism   . Osteoporosis, postmenopausal   . PONV (postoperative nausea and vomiting)    nauseated with colonoscopy  . Shingles    Past Surgical History:  Procedure Laterality Date  . BREAST BIOPSY Left 90s   benign  . COLONOSCOPY WITH PROPOFOL N/A 01/28/2016   Procedure: COLONOSCOPY WITH PROPOFOL;  Surgeon: Lollie Sails, MD;  Location: [facility] ENDOSCOPY;  Service: Endoscopy;  Laterality: N/A;  . EXCISION METACARPAL MASS Right 04/30/2017   Procedure: EXCISION MUCOID CYST FIFTH FINGER RIGHT HAND;  Surgeon: Hessie Knows, MD;  Location: [facility] ORS;  Service: Orthopedics;  Laterality: Right;  . EYE SURGERY Bilateral 2010   cataracts   Family History  Problem Relation Age of Onset  . Heart disease Father        died age 5 - myocardial infarction  . Hypercholesterolemia Mother   . Arthritis Mother   . Kidney cancer Unknown        aunt  . Breast cancer Maternal Aunt   . Colon cancer Neg Hx    Social History   Socioeconomic History  . Marital status: Married    Spouse name: Not on file  . Number of children: 3  . Years of education: 62  . Highest education level: Not on file  Occupational  History  . Occupation: retired Product manager: RETIRED  Social Needs  . Financial resource strain: Not on file  . Food insecurity:    Worry: Not on file    Inability: Not on file  . Transportation needs:    Medical: Not on file    Non-medical: Not on file  Tobacco Use  . Smoking status: Former Smoker    Last attempt to quit: 1990    Years since quitting: 29.6  . Smokeless tobacco: Never Used  Substance and Sexual Activity  . Alcohol use: Yes    Alcohol/week: 0.0 standard drinks    Comment: Rare drink, one drink per month  . Drug use: No  . Sexual activity: Yes  Lifestyle  . Physical activity:    Days per week: Not on file    Minutes per session: Not on file  . Stress: Not on file  Relationships  . Social connections:    Talks on phone: Not on file    Gets together: Not on file    Attends religious service: Not on file    Active member of club or organization: Not on file    Attends meetings of clubs or organizations: Not  on file    Relationship status: Not on file  Other Topics Concern  . Not on file  Social History Narrative  . Not on file    Outpatient Encounter Medications as of 01/22/2018  Medication Sig  . levothyroxine (SYNTHROID, LEVOTHROID) 50 MCG tablet Take 1 tablet (50 mcg total) by mouth daily.  Marland Kitchen lisinopril-hydrochlorothiazide (PRINZIDE,ZESTORETIC) 10-12.5 MG tablet TAKE 1 TABLET BY MOUTH EVERY DAY  . loratadine (CLARITIN) 10 MG tablet Take 10 mg by mouth daily.   . Multiple Vitamins-Minerals (CENTRUM SILVER ULTRA WOMENS PO) Take 1 tablet by mouth daily.   Marland Kitchen omeprazole (PRILOSEC) 40 MG capsule TAKE 1 CAPSULE (40 MG TOTAL) BY MOUTH ONCE DAILY TAKE 30 MINS BEFORE A MEAL  . rosuvastatin (CRESTOR) 5 MG tablet TAKE 1 TABLET BY MOUTH EVERY DAY  . sertraline (ZOLOFT) 50 MG tablet TAKE 1 TABLET (50 MG TOTAL) BY MOUTH DAILY.  Marland Kitchen triamcinolone lotion (KENALOG) 0.1 % APPLY TO SCALP 1-2 TIMES DAILY AS NEEDED  . [DISCONTINUED] levothyroxine (SYNTHROID, LEVOTHROID)  50 MCG tablet Take 1 tablet (50 mcg total) by mouth daily.   No facility-administered encounter medications on file as of 01/22/2018.     Review of Systems  Constitutional: Negative for appetite change.       Has lost weight.  Has been trying.  Feels better.    HENT: Negative for congestion and sinus pressure.   Respiratory: Negative for cough, chest tightness and shortness of breath.   Cardiovascular: Negative for chest pain, palpitations and leg swelling.  Gastrointestinal: Negative for abdominal pain, diarrhea, nausea and vomiting.  Genitourinary: Negative for difficulty urinating and dysuria.  Musculoskeletal: Negative for joint swelling and myalgias.  Skin: Negative for color change and rash.  Neurological: Negative for dizziness, light-headedness and headaches.  Psychiatric/Behavioral: Negative for agitation and dysphoric mood.       Objective:    Physical Exam  Constitutional: She appears well-developed and well-nourished. No distress.  HENT:  Nose: Nose normal.  Mouth/Throat: Oropharynx is clear and moist.  Neck: Neck supple. No thyromegaly present.  Cardiovascular: Normal rate and regular rhythm.  Pulmonary/Chest: Breath sounds normal. No respiratory distress. She has no wheezes.  Abdominal: Soft. Bowel sounds are normal. There is no tenderness.  Musculoskeletal: She exhibits no edema or tenderness.  Lymphadenopathy:    She has no cervical adenopathy.  Skin: No rash noted. No erythema.  Psychiatric: She has a normal mood and affect. Her behavior is normal.    BP 130/76 (BP Location: Left Arm, Patient Position: Sitting, Cuff Size: Normal)   Pulse 74   Temp 98.3 F (36.8 C) (Oral)   Resp 18   Wt 133 lb 12.8 oz (60.7 kg)   SpO2 94%   BMI 23.70 kg/m  Wt Readings from Last 3 Encounters:  01/22/18 133 lb 12.8 oz (60.7 kg)  10/05/17 141 lb 9.6 oz (64.2 kg)  07/02/17 144 lb (65.3 kg)     Lab Results  Component Value Date   WBC 4.3 12/02/2017   HGB 13.8  12/02/2017   HCT 40.1 12/02/2017   PLT 219.0 12/02/2017   GLUCOSE 93 09/30/2017   CHOL 223 (H) 09/30/2017   TRIG 81.0 09/30/2017   HDL 76.20 09/30/2017   LDLDIRECT 165.9 05/11/2013   LDLCALC 131 (H) 09/30/2017   ALT 20 09/30/2017   AST 29 09/30/2017   NA 139 09/30/2017   K 3.9 09/30/2017   CL 104 09/30/2017   CREATININE 0.78 09/30/2017   BUN 17 09/30/2017   CO2 27  09/30/2017   TSH 3.38 09/30/2017   INR 0.96 04/14/2016    Mm Diag Breast Tomo Uni Right  Result Date: 11/05/2017 CLINICAL DATA:  Patient presents for additional views of the right breast as follow-up to recent screening exam to evaluate a possible asymmetry. EXAM: DIGITAL DIAGNOSTIC UNILATERAL RIGHT MAMMOGRAM WITH CAD AND TOMO COMPARISON:  Previous exam(s). ACR Breast Density Category c: The breast tissue is heterogeneously dense, which may obscure small masses. FINDINGS: Spot compression MLO demonstrates persistence of a asymmetry over the slightly lower breast with a few associated adjacent microcalcifications. This asymmetry is over the slightly lower breast on the lateral image and therefore triangulated to the slightly inner breast on the CC image. The screening CC and spot-compression 3D CC images are unchanged from 2016 as this likely represents focal fibrocystic change. Mammographic images were processed with CAD. IMPRESSION: Asymmetry with microcalcifications over the in her lower right breast stable since 2016 and likely representing focal fibrocystic change. RECOMMENDATION: Recommend continued annual bilateral screening mammographic follow-up. I have discussed the findings and recommendations with the patient. Results were also provided in writing at the conclusion of the visit. If applicable, a reminder letter will be sent to the patient regarding the next appointment. BI-RADS CATEGORY  2: Benign. Electronically Signed   By: Marin Olp M.D.   On: 11/05/2017 14:45       Assessment & Plan:   Problem List Items  Addressed This Visit    Anemia    Follow cbc.       Aortic atherosclerosis ([place])    On crestor.        GERD (gastroesophageal reflux disease)    Saw GI.  On omeprazole.  No acid reflux reported.        Hypercholesterolemia    On crestor.  Has adjusted her diet.  Low weight.  Follow lipid panel and liver function tests.        Relevant Orders   Lipid panel   Hepatic function panel   Hypertension    Blood pressure under good control.  Continue same medication regimen.  Follow pressures.  Follow metabolic panel.        Relevant Orders   Basic metabolic panel   Hypothyroidism    On thyroid replacement.  Follow tsh.       Relevant Medications   levothyroxine (SYNTHROID, LEVOTHROID) 50 MCG tablet   Other Relevant Orders   TSH       Einar Pheasant, MD

## 2018-01-25 ENCOUNTER — Encounter: Payer: Self-pay | Admitting: Internal Medicine

## 2018-01-25 NOTE — Assessment & Plan Note (Signed)
On crestor.  Has adjusted her diet.  Low weight.  Follow lipid panel and liver function tests.

## 2018-01-25 NOTE — Assessment & Plan Note (Signed)
On thyroid replacement.  Follow tsh.  

## 2018-01-25 NOTE — Assessment & Plan Note (Signed)
Saw GI.  On omeprazole.  No acid reflux reported.

## 2018-01-25 NOTE — Assessment & Plan Note (Signed)
Follow cbc.  

## 2018-01-25 NOTE — Assessment & Plan Note (Signed)
On crestor.   

## 2018-01-25 NOTE — Assessment & Plan Note (Signed)
Blood pressure under good control.  Continue same medication regimen.  Follow pressures.  Follow metabolic panel.   

## 2018-02-11 ENCOUNTER — Other Ambulatory Visit (INDEPENDENT_AMBULATORY_CARE_PROVIDER_SITE_OTHER): Payer: Medicare Other

## 2018-02-11 DIAGNOSIS — E7800 Pure hypercholesterolemia, unspecified: Secondary | ICD-10-CM

## 2018-02-11 DIAGNOSIS — E039 Hypothyroidism, unspecified: Secondary | ICD-10-CM | POA: Diagnosis not present

## 2018-02-11 DIAGNOSIS — I10 Essential (primary) hypertension: Secondary | ICD-10-CM | POA: Diagnosis not present

## 2018-02-11 LAB — BASIC METABOLIC PANEL
BUN: 17 mg/dL (ref 6–23)
CALCIUM: 9.7 mg/dL (ref 8.4–10.5)
CO2: 30 meq/L (ref 19–32)
Chloride: 102 mEq/L (ref 96–112)
Creatinine, Ser: 0.84 mg/dL (ref 0.40–1.20)
GFR: 71.2 mL/min (ref >60.00)
GLUCOSE: 101 mg/dL — AB (ref 70–99)
Potassium: 3.7 mEq/L (ref 3.5–5.1)
SODIUM: 140 meq/L (ref 135–145)

## 2018-02-11 LAB — HEPATIC FUNCTION PANEL
ALT: 20 U/L (ref 0–35)
AST: 30 U/L (ref 0–37)
Albumin: 4.3 g/dL (ref 3.5–5.2)
Alkaline Phosphatase: 57 U/L (ref 39–117)
Bilirubin, Direct: 0.1 mg/dL (ref 0.0–0.3)
Total Bilirubin: 0.5 mg/dL (ref 0.2–1.2)
Total Protein: 7.1 g/dL (ref 6.0–8.3)

## 2018-02-11 LAB — LIPID PANEL
CHOL/HDL RATIO: 3
Cholesterol: 239 mg/dL — ABNORMAL HIGH (ref 0–200)
HDL: 74.3 mg/dL (ref >39.00)
LDL Cholesterol: 146 mg/dL — ABNORMAL HIGH (ref 0–99)
NonHDL: 164.79
TRIGLYCERIDES: 96 mg/dL (ref 0.0–149.0)
VLDL: 19.2 mg/dL (ref 0.0–40.0)

## 2018-02-11 LAB — TSH: TSH: 11.92 u[IU]/mL — ABNORMAL HIGH (ref 0.35–4.50)

## 2018-02-16 ENCOUNTER — Other Ambulatory Visit: Payer: Self-pay

## 2018-02-16 MED ORDER — LEVOTHYROXINE SODIUM 75 MCG PO TABS: 75.0000 ug | ORAL_TABLET | Freq: Every day | ORAL | 3 refills | Status: DC

## 2018-03-22 ENCOUNTER — Ambulatory Visit (INDEPENDENT_AMBULATORY_CARE_PROVIDER_SITE_OTHER): Payer: Medicare Other

## 2018-03-22 VITALS — BP 118/70 | HR 73 | Temp 98.4°F | Resp 15 | Ht 63.0 in | Wt 129.1 lb

## 2018-03-22 DIAGNOSIS — Z0000 Encounter for general adult medical examination without abnormal findings: Secondary | ICD-10-CM

## 2018-03-22 NOTE — Progress Notes (Signed)
Subjective:   Savannah Cox is a 72 y.o. female who presents for Medicare Annual (Subsequent) preventive examination.  Review of Systems:  No ROS.  Medicare Wellness Visit. Additional risk factors are reflected in the social history. Cardiac Risk Factors include: advanced age (>59men, >66 women);hypertension     Objective:     Vitals: BP 118/70 (BP Location: Left Arm, Patient Position: Sitting, Cuff Size: Normal)   Pulse 73   Temp 98.4 F (36.9 C) (Oral)   Resp 15   Ht 5\' 3"  (1.6 m)   Wt 129 lb 1.9 oz (58.6 kg)   SpO2 96%   BMI 22.87 kg/m   Body mass index is 22.87 kg/m.  Advanced Directives 03/22/2018 04/30/2017 04/20/2017 03/19/2017 04/14/2016 04/14/2016 03/17/2016  Does Patient Have a Medical Advance Directive? No No No No Yes No No  Type of Advance Directive - - - - Press photographer;Living will - -  Copy of [place] in Chart? - - - - No - copy requested - -  Would patient like information on creating a medical advance directive? No - Patient declined No - Patient declined No - Patient declined No - Patient declined - Yes - Scientist, clinical (histocompatibility and immunogenetics) given Yes - Scientist, clinical (histocompatibility and immunogenetics) given    Tobacco Social History   Tobacco Use  Smoking Status Former Smoker  . Last attempt to quit: 1990  . Years since quitting: 29.7  Smokeless Tobacco Never Used     Counseling given: Not Answered   Clinical Intake:  Pre-visit preparation completed: Yes  Pain : No/denies pain     Nutritional Status: BMI of 19-24  Normal Diabetes: No  How often do you need to have someone help you when you read instructions, pamphlets, or other written materials from your doctor or pharmacy?: 1 - Never  Interpreter Needed?: No     Past Medical History:  Diagnosis Date  . Allergic state   . Anemia   . Complication of anesthesia   . Diverticulosis   . GERD (gastroesophageal reflux disease)   . History of hiatal hernia   . History of kidney stones   .  Hypercholesterolemia   . Hypertension   . Hypothyroidism   . Osteoporosis, postmenopausal   . PONV (postoperative nausea and vomiting)    nauseated with colonoscopy  . Shingles    Past Surgical History:  Procedure Laterality Date  . BREAST BIOPSY Left 90s   benign  . COLONOSCOPY WITH PROPOFOL N/A 01/28/2016   Procedure: COLONOSCOPY WITH PROPOFOL;  Surgeon: Lollie Sails, MD;  Location: [facility] ENDOSCOPY;  Service: Endoscopy;  Laterality: N/A;  . EXCISION METACARPAL MASS Right 04/30/2017   Procedure: EXCISION MUCOID CYST FIFTH FINGER RIGHT HAND;  Surgeon: Hessie Knows, MD;  Location: [facility] ORS;  Service: Orthopedics;  Laterality: Right;  . EYE SURGERY Bilateral 2010   cataracts   Family History  Problem Relation Age of Onset  . Heart disease Father        died age 42 - myocardial infarction  . Hypercholesterolemia Mother   . Arthritis Mother   . Kidney cancer Unknown        aunt  . Breast cancer Maternal Aunt   . Colon cancer Neg Hx    Social History   Socioeconomic History  . Marital status: Married    Spouse name: Not on file  . Number of children: 3  . Years of education: 42  . Highest education level: Not on file  Occupational History  .  Occupation: retired Product manager: RETIRED  Social Needs  . Financial resource strain: Not hard at all  . Food insecurity:    Worry: Never true    Inability: Never true  . Transportation needs:    Medical: No    Non-medical: No  Tobacco Use  . Smoking status: Former Smoker    Last attempt to quit: 1990    Years since quitting: 29.7  . Smokeless tobacco: Never Used  Substance and Sexual Activity  . Alcohol use: Yes    Alcohol/week: 0.0 standard drinks    Comment: Rare drink, one drink per month  . Drug use: No  . Sexual activity: Yes  Lifestyle  . Physical activity:    Days per week: 3 days    Minutes per session: 60 min  . Stress: Not at all  Relationships  . Social connections:    Talks on phone: Not on  file    Gets together: Not on file    Attends religious service: Not on file    Active member of club or organization: Not on file    Attends meetings of clubs or organizations: Not on file    Relationship status: Not on file  Other Topics Concern  . Not on file  Social History Narrative  . Not on file    Outpatient Encounter Medications as of 03/22/2018  Medication Sig  . levothyroxine (SYNTHROID, LEVOTHROID) 75 MCG tablet Take 1 tablet (75 mcg total) by mouth daily.  Marland Kitchen lisinopril-hydrochlorothiazide (PRINZIDE,ZESTORETIC) 10-12.5 MG tablet TAKE 1 TABLET BY MOUTH EVERY DAY  . loratadine (CLARITIN) 10 MG tablet Take 10 mg by mouth daily.   . Multiple Vitamins-Minerals (CENTRUM SILVER ULTRA WOMENS PO) Take 1 tablet by mouth daily.   Marland Kitchen omeprazole (PRILOSEC) 40 MG capsule TAKE 1 CAPSULE (40 MG TOTAL) BY MOUTH ONCE DAILY TAKE 30 MINS BEFORE A MEAL  . rosuvastatin (CRESTOR) 5 MG tablet TAKE 1 TABLET BY MOUTH EVERY DAY  . sertraline (ZOLOFT) 50 MG tablet TAKE 1 TABLET (50 MG TOTAL) BY MOUTH DAILY.  Marland Kitchen triamcinolone lotion (KENALOG) 0.1 % APPLY TO SCALP 1-2 TIMES DAILY AS NEEDED   No facility-administered encounter medications on file as of 03/22/2018.     Activities of Daily Living In your present state of health, do you have any difficulty performing the following activities: 03/22/2018 04/20/2017  Hearing? N N  Vision? N N  Comment - wears glasses  Difficulty concentrating or making decisions? N N  Walking or climbing stairs? N N  Dressing or bathing? N N  Doing errands, shopping? N N  Preparing Food and eating ? N -  Using the Toilet? N -  In the past six months, have you accidently leaked urine? Y -  Comment Managed with a daily liner.  -  Do you have problems with loss of bowel control? N -  Managing your Medications? N -  Managing your Finances? N -  Housekeeping or managing your Housekeeping? N -  Some recent data might be hidden    Patient Care Team: Einar Pheasant, MD as  PCP - General (Internal Medicine)    Assessment:   This is a routine wellness examination for Savannah Cox.  The goal of the wellness visit is to assist the patient how to close the gaps in care and create a preventative care plan for the patient.   The roster of all physicians providing medical care to patient is listed in the Snapshot section of the chart.  Osteoporosis reviewed.    Safety issues reviewed; Smoke and carbon monoxide detectors in the home. No firearms or firearms locked in a safe within the home. Wears seatbelts when driving or riding with others. No violence in the home.  They do not have excessive sun exposure.  Discussed the need for sun protection: hats, long sleeves and the use of sunscreen if there is significant sun exposure.  No new identified risk were noted.  No failures at ADL's or IADL's.   BMI- discussed the importance of a healthy diet, water intake and the benefits of aerobic exercise. Educational material provided.   Diet: Intermittent fasting.   Dental- every 6 months.  Eye- Visual acuity not assessed per patient preference since they have regular follow up with the ophthalmologist.  Wears corrective lenses.  Sleep patterns- Sleeps without issues.   Influenza vaccine discussed; she plans to receive later in the season.   Patient Concerns: None at this time. Follow up with PCP as needed.  Exercise Activities and Dietary recommendations Current Exercise Habits: Home exercise routine, Type of exercise: walking, Time (Minutes): 60, Frequency (Times/Week): 3, Weekly Exercise (Minutes/Week): 180  Goals    . Healthy Lifestyle     Healthy diet Stay active Stay hydrated       Fall Risk Fall Risk  03/22/2018 03/19/2017 03/17/2016 02/19/2016 09/04/2014  Falls in the past year? No No No No No   Depression Screen PHQ 2/9 Scores 03/22/2018 03/19/2017 12/23/2016 03/17/2016  PHQ - 2 Score 0 0 0 0  PHQ- 9 Score - 0 0 -     Cognitive Function MMSE - Mini  Mental State Exam 03/22/2018 03/19/2017 03/17/2016  Orientation to time 5 5 5   Orientation to Place 5 5 5   Registration 3 3 3   Attention/ Calculation 5 5 5   Recall 3 3 3   Language- name 2 objects 2 2 2   Language- repeat 1 1 1   Language- follow 3 step command 3 3 3   Language- read & follow direction 1 1 1   Write a sentence 1 1 1   Copy design 1 1 1   Total score 30 30 30         Immunization History  Administered Date(s) Administered  . DTaP 12/09/2010  . Influenza Split 04/29/2012  . Influenza, High Dose Seasonal PF 02/19/2016, 03/19/2017  . Influenza,inj,Quad PF,6+ Mos 02/21/2013, 02/13/2014, 05/14/2015  . Pneumococcal Conjugate-13 04/28/2013  . Pneumococcal Polysaccharide-23 02/13/2014  . Zoster 10/12/2011   Screening Tests Health Maintenance  Topic Date Due  . INFLUENZA VACCINE  01/14/2018  . MAMMOGRAM  10/22/2018  . TETANUS/TDAP  12/08/2020  . COLONOSCOPY  01/27/2021  . DEXA SCAN  Completed  . Hepatitis C Screening  Completed  . PNA vac Low Risk Adult  Completed      Plan:   End of life planning; Advanced aging; Advanced directives discussed.  No HCPOA/Living Will.  Additional information declined at this time.  I have personally reviewed and noted the following in the patient's chart:   . Medical and social history . Use of alcohol, tobacco or illicit drugs  . Current medications and supplements . Functional ability and status . Nutritional status . Physical activity . Advanced directives . List of other physicians . Hospitalizations, surgeries, and ER visits in previous 12 months . Vitals . Screenings to include cognitive, depression, and falls . Referrals and appointments  In addition, I have reviewed and discussed with patient certain preventive protocols, quality metrics, and best practice recommendations. A written personalized care  plan for preventive services as well as general preventive health recommendations were provided to patient.      Varney Biles, LPN  [id number]   Agree with plan. Mable Paris, NP

## 2018-03-22 NOTE — Patient Instructions (Addendum)
  Savannah Cox , Thank you for taking time to come for your Medicare Wellness Visit. I appreciate your ongoing commitment to your health goals. Please review the following plan we discussed and let me know if I can assist you in the future.   These are the goals we discussed: Goals    . Healthy Lifestyle     Healthy diet Stay active Stay hydrated       This is a list of the screening recommended for you and due dates:  Health Maintenance  Topic Date Due  . Flu Shot  01/14/2018  . Mammogram  10/22/2018  . Tetanus Vaccine  12/08/2020  . Colon Cancer Screening  01/27/2021  . DEXA scan (bone density measurement)  Completed  .  Hepatitis C: One time screening is recommended by Center for Disease Control  (CDC) for  adults born from [birth year] through 1963.   Completed  . Pneumonia vaccines  Completed

## 2018-04-30 ENCOUNTER — Other Ambulatory Visit: Payer: Self-pay | Admitting: Internal Medicine

## 2018-05-16 ENCOUNTER — Other Ambulatory Visit: Payer: Self-pay | Admitting: Internal Medicine

## 2018-06-03 ENCOUNTER — Ambulatory Visit: Payer: Medicare Other | Admitting: Internal Medicine

## 2018-06-03 ENCOUNTER — Encounter: Payer: Self-pay | Admitting: Internal Medicine

## 2018-06-03 VITALS — BP 102/60 | HR 67 | Temp 97.3°F | Resp 16 | Ht 63.0 in | Wt 131.4 lb

## 2018-06-03 DIAGNOSIS — E7800 Pure hypercholesterolemia, unspecified: Secondary | ICD-10-CM

## 2018-06-03 DIAGNOSIS — I10 Essential (primary) hypertension: Secondary | ICD-10-CM

## 2018-06-03 DIAGNOSIS — Z23 Encounter for immunization: Secondary | ICD-10-CM | POA: Diagnosis not present

## 2018-06-03 DIAGNOSIS — I700 Atherosclerosis of aorta: Secondary | ICD-10-CM | POA: Diagnosis not present

## 2018-06-03 DIAGNOSIS — K219 Gastro-esophageal reflux disease without esophagitis: Secondary | ICD-10-CM

## 2018-06-03 DIAGNOSIS — M25552 Pain in left hip: Secondary | ICD-10-CM

## 2018-06-03 DIAGNOSIS — D649 Anemia, unspecified: Secondary | ICD-10-CM

## 2018-06-03 DIAGNOSIS — E039 Hypothyroidism, unspecified: Secondary | ICD-10-CM

## 2018-06-03 DIAGNOSIS — Z0000 Encounter for general adult medical examination without abnormal findings: Secondary | ICD-10-CM

## 2018-06-03 DIAGNOSIS — E2839 Other primary ovarian failure: Secondary | ICD-10-CM

## 2018-06-03 DIAGNOSIS — M25551 Pain in right hip: Secondary | ICD-10-CM

## 2018-06-03 NOTE — Assessment & Plan Note (Signed)
Physical today 06/03/18.  PAP 04/30/17 - negative with negative HPV.  Mammogram 10/21/17 - birads 0.  F/u right breast mammogram 11/05/17 - Birads II.  Recommended continued yearly f/u - due in 10/2018.  Colonoscopy 01/2016.  Recommended f/u in 5 years.

## 2018-06-03 NOTE — Progress Notes (Addendum)
Patient ID: Savannah Cox, female   DOB: October 04, 1945, 72 y.o.   MRN: [id number]   Subjective:    Patient ID: Savannah Cox, female    DOB: December 14, 1945, 72 y.o.   MRN: [id number]  HPI  Patient here for her physical exam.  She reports she is doing relatively well.  Fell Thanksgiving.  Hit her head.  No residual headache.  No light dizziness or light headedness.  Right knee aggravated.  Reports some hip pain (left>right). Present for two months.  Localized posterior left - hip.  Able to walk without significant difficulty.  No chest pain.  No sob.  No acid reflux.  No abdominal pain.  Bowels moving.  Is exercising.  Exercises 1-2x/week.     Past Medical History:  Diagnosis Date  . Allergic state   . Anemia   . Complication of anesthesia   . Diverticulosis   . GERD (gastroesophageal reflux disease)   . History of hiatal hernia   . History of kidney stones   . Hypercholesterolemia   . Hypertension   . Hypothyroidism   . Osteoporosis, postmenopausal   . PONV (postoperative nausea and vomiting)    nauseated with colonoscopy  . Shingles    Past Surgical History:  Procedure Laterality Date  . BREAST BIOPSY Left 90s   benign  . COLONOSCOPY WITH PROPOFOL N/A 01/28/2016   Procedure: COLONOSCOPY WITH PROPOFOL;  Surgeon: Lollie Sails, MD;  Location: [facility] ENDOSCOPY;  Service: Endoscopy;  Laterality: N/A;  . EXCISION METACARPAL MASS Right 04/30/2017   Procedure: EXCISION MUCOID CYST FIFTH FINGER RIGHT HAND;  Surgeon: Hessie Knows, MD;  Location: [facility] ORS;  Service: Orthopedics;  Laterality: Right;  . EYE SURGERY Bilateral 2010   cataracts   Family History  Problem Relation Age of Onset  . Heart disease Father        died age 31 - myocardial infarction  . Hypercholesterolemia Mother   . Arthritis Mother   . Kidney cancer Other        aunt  . Breast cancer Maternal Aunt   . Colon cancer Neg Hx    Social History   Socioeconomic History  . Marital status: Married    Spouse  name: Not on file  . Number of children: 3  . Years of education: 23  . Highest education level: Not on file  Occupational History  . Occupation: retired Product manager: RETIRED  Social Needs  . Financial resource strain: Not hard at all  . Food insecurity:    Worry: Never true    Inability: Never true  . Transportation needs:    Medical: No    Non-medical: No  Tobacco Use  . Smoking status: Former Smoker    Last attempt to quit: 1990    Years since quitting: 30.0  . Smokeless tobacco: Never Used  Substance and Sexual Activity  . Alcohol use: Yes    Alcohol/week: 0.0 standard drinks    Comment: Rare drink, one drink per month  . Drug use: No  . Sexual activity: Yes  Lifestyle  . Physical activity:    Days per week: 3 days    Minutes per session: 60 min  . Stress: Not at all  Relationships  . Social connections:    Talks on phone: Not on file    Gets together: Not on file    Attends religious service: Not on file    Active member of club or organization: Not on file  Attends meetings of clubs or organizations: Not on file    Relationship status: Not on file  Other Topics Concern  . Not on file  Social History Narrative  . Not on file    Outpatient Encounter Medications as of 06/03/2018  Medication Sig  . levothyroxine (SYNTHROID, LEVOTHROID) 75 MCG tablet Take 1 tablet (75 mcg total) by mouth daily.  Marland Kitchen lisinopril-hydrochlorothiazide (PRINZIDE,ZESTORETIC) 10-12.5 MG tablet TAKE 1 TABLET BY MOUTH EVERY DAY  . loratadine (CLARITIN) 10 MG tablet Take 10 mg by mouth daily.   . Multiple Vitamins-Minerals (CENTRUM SILVER ULTRA WOMENS PO) Take 1 tablet by mouth daily.   Marland Kitchen omeprazole (PRILOSEC) 40 MG capsule TAKE 1 CAPSULE (40 MG TOTAL) BY MOUTH ONCE DAILY TAKE 30 MINS BEFORE A MEAL  . rosuvastatin (CRESTOR) 5 MG tablet TAKE 1 TABLET BY MOUTH EVERY DAY  . sertraline (ZOLOFT) 50 MG tablet TAKE 1 TABLET BY MOUTH EVERY DAY  . triamcinolone lotion (KENALOG) 0.1 % APPLY  TO SCALP 1-2 TIMES DAILY AS NEEDED   No facility-administered encounter medications on file as of 06/03/2018.     Review of Systems  Constitutional: Negative for appetite change and unexpected weight change.  HENT: Negative for congestion and sinus pressure.   Eyes: Negative for pain and visual disturbance.  Respiratory: Negative for cough, chest tightness and shortness of breath.   Cardiovascular: Negative for chest pain, palpitations and leg swelling.  Gastrointestinal: Negative for abdominal pain, diarrhea, nausea and vomiting.  Genitourinary: Negative for difficulty urinating and dysuria.  Musculoskeletal: Negative for joint swelling and myalgias.       Hip pain as outlined.    Skin: Negative for color change and rash.  Neurological: Negative for dizziness, light-headedness and headaches.  Hematological: Negative for adenopathy. Does not bruise/bleed easily.  Psychiatric/Behavioral: Negative for agitation and dysphoric mood.       Objective:     Blood pressure rechecked by me:  122/70  Physical Exam Constitutional:      General: She is not in acute distress.    Appearance: Normal appearance. She is well-developed.  HENT:     Nose: Nose normal. No congestion.  Eyes:     General: No scleral icterus.       Right eye: No discharge.        Left eye: No discharge.  Neck:     Musculoskeletal: Neck supple. No muscular tenderness.     Thyroid: No thyromegaly.  Cardiovascular:     Rate and Rhythm: Normal rate and regular rhythm.  Pulmonary:     Effort: No tachypnea, accessory muscle usage or respiratory distress.     Breath sounds: Normal breath sounds. No decreased breath sounds, wheezing or rhonchi.  Chest:     Breasts:        Right: No inverted nipple, mass, nipple discharge or tenderness (no axillary adenopathy).        Left: No inverted nipple, mass, nipple discharge or tenderness (no axilarry adenopathy).  Abdominal:     General: Bowel sounds are normal.      Palpations: Abdomen is soft.     Tenderness: There is no abdominal tenderness.  Musculoskeletal:        General: No swelling or tenderness.  Lymphadenopathy:     Cervical: No cervical adenopathy.  Skin:    Findings: No erythema or rash.  Neurological:     Mental Status: She is alert and oriented to person, place, and time.  Psychiatric:        Mood and  Affect: Mood normal.        Behavior: Behavior normal.     BP 102/60 (BP Location: Left Arm, Patient Position: Sitting, Cuff Size: Normal)   Pulse 67   Temp (!) 97.3 F (36.3 C) (Oral)   Resp 16   Ht 5\' 3"  (1.6 m)   Wt 131 lb 6.4 oz (59.6 kg)   SpO2 97%   BMI 23.28 kg/m  Wt Readings from Last 3 Encounters:  06/03/18 131 lb 6.4 oz (59.6 kg)  03/22/18 129 lb 1.9 oz (58.6 kg)  01/22/18 133 lb 12.8 oz (60.7 kg)     Lab Results  Component Value Date   WBC 4.3 12/02/2017   HGB 13.8 12/02/2017   HCT 40.1 12/02/2017   PLT 219.0 12/02/2017   GLUCOSE 101 (H) 02/11/2018   CHOL 239 (H) 02/11/2018   TRIG 96.0 02/11/2018   HDL 74.30 02/11/2018   LDLDIRECT 165.9 05/11/2013   LDLCALC 146 (H) 02/11/2018   ALT 20 02/11/2018   AST 30 02/11/2018   NA 140 02/11/2018   K 3.7 02/11/2018   CL 102 02/11/2018   CREATININE 0.84 02/11/2018   BUN 17 02/11/2018   CO2 30 02/11/2018   TSH 11.92 (H) 02/11/2018   INR 0.96 04/14/2016    Mm Diag Breast Tomo Uni Right  Result Date: 11/05/2017 CLINICAL DATA:  Patient presents for additional views of the right breast as follow-up to recent screening exam to evaluate a possible asymmetry. EXAM: DIGITAL DIAGNOSTIC UNILATERAL RIGHT MAMMOGRAM WITH CAD AND TOMO COMPARISON:  Previous exam(s). ACR Breast Density Category c: The breast tissue is heterogeneously dense, which may obscure small masses. FINDINGS: Spot compression MLO demonstrates persistence of a asymmetry over the slightly lower breast with a few associated adjacent microcalcifications. This asymmetry is over the slightly lower breast on the  lateral image and therefore triangulated to the slightly inner breast on the CC image. The screening CC and spot-compression 3D CC images are unchanged from 2016 as this likely represents focal fibrocystic change. Mammographic images were processed with CAD. IMPRESSION: Asymmetry with microcalcifications over the in her lower right breast stable since 2016 and likely representing focal fibrocystic change. RECOMMENDATION: Recommend continued annual bilateral screening mammographic follow-up. I have discussed the findings and recommendations with the patient. Results were also provided in writing at the conclusion of the visit. If applicable, a reminder letter will be sent to the patient regarding the next appointment. BI-RADS CATEGORY  2: Benign. Electronically Signed   By: Marin Olp M.D.   On: 11/05/2017 14:45       Assessment & Plan:   Problem List Items Addressed This Visit    Anemia    Follow cbc.        Aortic atherosclerosis ([place])    On crestor.        Bilateral hip pain    Left > right.  Discussed exercise and stretches.  Discussed xray, physical therapy and referral to ortho. She declines.  Wants to follow.  Will notify me if symptoms persist.        GERD (gastroesophageal reflux disease)    Controlled on current regimen.  Follow.        Health care maintenance    Physical today 06/03/18.  PAP 04/30/17 - negative with negative HPV.  Mammogram 10/21/17 - birads 0.  F/u right breast mammogram 11/05/17 - Birads II.  Recommended continued yearly f/u - due in 10/2018.  Colonoscopy 01/2016.  Recommended f/u in 5 years.  Hypercholesterolemia    On crestor.  Low cholesterol diet and exercise.  Follow lipid panel and liver function tests.        Relevant Orders   Lipid panel   Hepatic function panel   Hypertension    Blood pressure under good control.  Continue same medication regimen.  Follow pressures.  Follow metabolic panel.        Relevant Orders   Basic metabolic panel    Hypothyroidism    On thyroid replacement.  Follow tsh.        Relevant Orders   TSH    Other Visit Diagnoses    Routine general medical examination at a health care facility    -  Primary   Estrogen deficiency       Relevant Orders   DG Bone Density   Encounter for immunization       Relevant Orders   Flu vaccine HIGH DOSE PF (Completed)       Einar Pheasant, MD

## 2018-06-12 ENCOUNTER — Encounter: Payer: Self-pay | Admitting: Internal Medicine

## 2018-06-12 DIAGNOSIS — M25552 Pain in left hip: Secondary | ICD-10-CM

## 2018-06-12 DIAGNOSIS — M25551 Pain in right hip: Secondary | ICD-10-CM | POA: Insufficient documentation

## 2018-06-12 NOTE — Assessment & Plan Note (Signed)
Follow cbc.  

## 2018-06-12 NOTE — Assessment & Plan Note (Signed)
On thyroid replacement.  Follow tsh.  

## 2018-06-12 NOTE — Assessment & Plan Note (Signed)
On crestor.   

## 2018-06-12 NOTE — Assessment & Plan Note (Signed)
Blood pressure under good control.  Continue same medication regimen.  Follow pressures.  Follow metabolic panel.   

## 2018-06-12 NOTE — Assessment & Plan Note (Signed)
Left > right.  Discussed exercise and stretches.  Discussed xray, physical therapy and referral to ortho. She declines.  Wants to follow.  Will notify me if symptoms persist.

## 2018-06-12 NOTE — Assessment & Plan Note (Signed)
On crestor.  Low cholesterol diet and exercise.  Follow lipid panel and liver function tests.   

## 2018-06-12 NOTE — Assessment & Plan Note (Signed)
Controlled on current regimen.  Follow.  

## 2018-06-23 ENCOUNTER — Ambulatory Visit
Admission: RE | Admit: 2018-06-23 | Discharge: 2018-06-23 | Disposition: A | Discharge status: Home / Self Care | Payer: Medicare Other | Source: Ambulatory Visit | Attending: Internal Medicine | Admitting: Internal Medicine

## 2018-06-23 DIAGNOSIS — E2839 Other primary ovarian failure: Secondary | ICD-10-CM

## 2018-06-25 ENCOUNTER — Other Ambulatory Visit (INDEPENDENT_AMBULATORY_CARE_PROVIDER_SITE_OTHER): Payer: Medicare Other

## 2018-06-25 DIAGNOSIS — E7800 Pure hypercholesterolemia, unspecified: Secondary | ICD-10-CM | POA: Diagnosis not present

## 2018-06-25 DIAGNOSIS — E039 Hypothyroidism, unspecified: Secondary | ICD-10-CM

## 2018-06-25 DIAGNOSIS — I10 Essential (primary) hypertension: Secondary | ICD-10-CM

## 2018-06-25 LAB — HEPATIC FUNCTION PANEL
ALT: 24 U/L (ref 0–35)
AST: 35 U/L (ref 0–37)
Albumin: 4.2 g/dL (ref 3.5–5.2)
Alkaline Phosphatase: 57 U/L (ref 39–117)
BILIRUBIN DIRECT: 0.1 mg/dL (ref 0.0–0.3)
BILIRUBIN TOTAL: 0.4 mg/dL (ref 0.2–1.2)
Total Protein: 6.9 g/dL (ref 6.0–8.3)

## 2018-06-25 LAB — BASIC METABOLIC PANEL
BUN: 14 mg/dL (ref 6–23)
CHLORIDE: 105 meq/L (ref 96–112)
CO2: 25 mEq/L (ref 19–32)
Calcium: 9.7 mg/dL (ref 8.4–10.5)
Creatinine, Ser: 0.79 mg/dL (ref 0.40–1.20)
GFR: 76.35 mL/min (ref >60.00)
Glucose, Bld: 98 mg/dL (ref 70–99)
POTASSIUM: 3.7 meq/L (ref 3.5–5.1)
Sodium: 141 mEq/L (ref 135–145)

## 2018-06-25 LAB — TSH: TSH: 0.6 u[IU]/mL (ref 0.35–4.50)

## 2018-06-25 LAB — LIPID PANEL
CHOL/HDL RATIO: 4
Cholesterol: 296 mg/dL — ABNORMAL HIGH (ref 0–200)
HDL: 78.8 mg/dL (ref >39.00)
LDL CALC: 197 mg/dL — AB (ref 0–99)
NonHDL: 217.41
Triglycerides: 102 mg/dL (ref 0.0–149.0)
VLDL: 20.4 mg/dL (ref 0.0–40.0)

## 2018-07-09 ENCOUNTER — Ambulatory Visit (INDEPENDENT_AMBULATORY_CARE_PROVIDER_SITE_OTHER): Payer: Medicare Other

## 2018-07-09 ENCOUNTER — Ambulatory Visit: Payer: Medicare Other | Admitting: Internal Medicine

## 2018-07-09 ENCOUNTER — Encounter: Payer: Self-pay | Admitting: Internal Medicine

## 2018-07-09 VITALS — BP 110/60 | HR 67 | Temp 98.0°F | Resp 16 | Wt 133.2 lb

## 2018-07-09 DIAGNOSIS — K219 Gastro-esophageal reflux disease without esophagitis: Secondary | ICD-10-CM

## 2018-07-09 DIAGNOSIS — M25552 Pain in left hip: Secondary | ICD-10-CM

## 2018-07-09 DIAGNOSIS — M810 Age-related osteoporosis without current pathological fracture: Secondary | ICD-10-CM | POA: Diagnosis not present

## 2018-07-09 DIAGNOSIS — E7800 Pure hypercholesterolemia, unspecified: Secondary | ICD-10-CM

## 2018-07-09 DIAGNOSIS — M545 Low back pain, unspecified: Secondary | ICD-10-CM

## 2018-07-09 DIAGNOSIS — I700 Atherosclerosis of aorta: Secondary | ICD-10-CM

## 2018-07-09 DIAGNOSIS — E039 Hypothyroidism, unspecified: Secondary | ICD-10-CM

## 2018-07-09 DIAGNOSIS — I10 Essential (primary) hypertension: Secondary | ICD-10-CM

## 2018-07-09 DIAGNOSIS — D649 Anemia, unspecified: Secondary | ICD-10-CM | POA: Diagnosis not present

## 2018-07-09 LAB — URINALYSIS, ROUTINE W REFLEX MICROSCOPIC
Bilirubin Urine: NEGATIVE
KETONES UR: NEGATIVE
Leukocytes, UA: NEGATIVE
NITRITE: NEGATIVE
TOTAL PROTEIN, URINE-UPE24: NEGATIVE
URINE GLUCOSE: NEGATIVE
Urobilinogen, UA: 0.2 (ref 0.0–1.0)
pH: 7 (ref 5.0–8.0)

## 2018-07-09 LAB — VITAMIN D 25 HYDROXY (VIT D DEFICIENCY, FRACTURES): VITD: 45.2 ng/mL (ref 30.00–100.00)

## 2018-07-09 NOTE — Progress Notes (Signed)
Patient ID: Tyger Oka, female   DOB: July 22, 1945, 73 y.o.   MRN: [id number]   Subjective:    Patient ID: Mairely Foxworth, female    DOB: 11-Aug-1945, 73 y.o.   MRN: [id number]  HPI  Patient here for a scheduled follow up.  Here to discuss her bone density.  Discussed radius T score -3.7, spine -2.2.  Discussed treatment options.  She prefers to try reclast.  Discussed the need for labs prior to form completion. Tries to stay active.  Discussed weight bearing exercise.  Discussed taking calcium and vitamin D.  No chest pain.  No sob.  No acid reflux.  No abdominal pain.  Does report pain in left hip/groin (lower back/buttock).  Present for a while.  No known injury or trauma.  Increased pain when steps up on her foot.     Past Medical History:  Diagnosis Date  . Allergic state   . Anemia   . Complication of anesthesia   . Diverticulosis   . GERD (gastroesophageal reflux disease)   . History of hiatal hernia   . History of kidney stones   . Hypercholesterolemia   . Hypertension   . Hypothyroidism   . Osteoporosis, postmenopausal   . PONV (postoperative nausea and vomiting)    nauseated with colonoscopy  . Shingles    Past Surgical History:  Procedure Laterality Date  . BREAST BIOPSY Left 90s   benign  . COLONOSCOPY WITH PROPOFOL N/A 01/28/2016   Procedure: COLONOSCOPY WITH PROPOFOL;  Surgeon: Lollie Sails, MD;  Location: [facility] ENDOSCOPY;  Service: Endoscopy;  Laterality: N/A;  . EXCISION METACARPAL MASS Right 04/30/2017   Procedure: EXCISION MUCOID CYST FIFTH FINGER RIGHT HAND;  Surgeon: Hessie Knows, MD;  Location: [facility] ORS;  Service: Orthopedics;  Laterality: Right;  . EYE SURGERY Bilateral 2010   cataracts   Family History  Problem Relation Age of Onset  . Heart disease Father        died age 70 - myocardial infarction  . Hypercholesterolemia Mother   . Arthritis Mother   . Kidney cancer Other        aunt  . Breast cancer Maternal Aunt   . Colon cancer Neg Hx     Social History   Socioeconomic History  . Marital status: Married    Spouse name: Not on file  . Number of children: 3  . Years of education: 72  . Highest education level: Not on file  Occupational History  . Occupation: retired Product manager: RETIRED  Social Needs  . Financial resource strain: Not hard at all  . Food insecurity:    Worry: Never true    Inability: Never true  . Transportation needs:    Medical: No    Non-medical: No  Tobacco Use  . Smoking status: Former Smoker    Last attempt to quit: 1990    Years since quitting: 30.0  . Smokeless tobacco: Never Used  Substance and Sexual Activity  . Alcohol use: Yes    Alcohol/week: 0.0 standard drinks    Comment: Rare drink, one drink per month  . Drug use: No  . Sexual activity: Yes  Lifestyle  . Physical activity:    Days per week: 3 days    Minutes per session: 60 min  . Stress: Not at all  Relationships  . Social connections:    Talks on phone: Not on file    Gets together: Not on file    Attends  religious service: Not on file    Active member of club or organization: Not on file    Attends meetings of clubs or organizations: Not on file    Relationship status: Not on file  Other Topics Concern  . Not on file  Social History Narrative  . Not on file    Outpatient Encounter Medications as of 07/09/2018  Medication Sig  . levothyroxine (SYNTHROID, LEVOTHROID) 75 MCG tablet Take 1 tablet (75 mcg total) by mouth daily.  Marland Kitchen lisinopril-hydrochlorothiazide (PRINZIDE,ZESTORETIC) 10-12.5 MG tablet TAKE 1 TABLET BY MOUTH EVERY DAY  . loratadine (CLARITIN) 10 MG tablet Take 10 mg by mouth daily.   . Multiple Vitamins-Minerals (CENTRUM SILVER ULTRA WOMENS PO) Take 1 tablet by mouth daily.   Marland Kitchen omeprazole (PRILOSEC) 40 MG capsule TAKE 1 CAPSULE (40 MG TOTAL) BY MOUTH ONCE DAILY TAKE 30 MINS BEFORE A MEAL  . rosuvastatin (CRESTOR) 5 MG tablet TAKE 1 TABLET BY MOUTH EVERY DAY  . sertraline (ZOLOFT) 50 MG  tablet TAKE 1 TABLET BY MOUTH EVERY DAY  . [DISCONTINUED] triamcinolone lotion (KENALOG) 0.1 % APPLY TO SCALP 1-2 TIMES DAILY AS NEEDED   No facility-administered encounter medications on file as of 07/09/2018.     Review of Systems  Constitutional: Negative for appetite change and unexpected weight change.  HENT: Negative for congestion and sinus pressure.   Respiratory: Negative for cough, chest tightness and shortness of breath.   Cardiovascular: Negative for chest pain, palpitations and leg swelling.  Gastrointestinal: Negative for abdominal pain, diarrhea, nausea and vomiting.  Genitourinary: Negative for difficulty urinating and dysuria.  Musculoskeletal: Negative for joint swelling and myalgias.       Left hip/groin pain as outlined.    Skin: Negative for color change and rash.  Neurological: Negative for dizziness, light-headedness and headaches.  Psychiatric/Behavioral: Negative for agitation and dysphoric mood.       Objective:    Physical Exam Constitutional:      General: She is not in acute distress.    Appearance: Normal appearance.  HENT:     Nose: Nose normal. No congestion.     Mouth/Throat:     Pharynx: No oropharyngeal exudate or posterior oropharyngeal erythema.  Neck:     Musculoskeletal: Neck supple. No muscular tenderness.     Thyroid: No thyromegaly.  Cardiovascular:     Rate and Rhythm: Normal rate and regular rhythm.  Pulmonary:     Effort: No respiratory distress.     Breath sounds: Normal breath sounds. No wheezing.  Abdominal:     General: Bowel sounds are normal.     Palpations: Abdomen is soft.     Tenderness: There is no abdominal tenderness.  Musculoskeletal:        General: No swelling or tenderness.     Comments: Some increased pulling sensation left hip/groin/lower buttock - with full extension.    Lymphadenopathy:     Cervical: No cervical adenopathy.  Skin:    Findings: No erythema or rash.  Neurological:     Mental Status: She  is alert.  Psychiatric:        Mood and Affect: Mood normal.        Behavior: Behavior normal.     BP 110/60 (BP Location: Left Arm, Patient Position: Sitting, Cuff Size: Normal)   Pulse 67   Temp 98 F (36.7 C) (Oral)   Resp 16   Wt 133 lb 3.2 oz (60.4 kg)   SpO2 97%   BMI 23.60 kg/m  Wt  Readings from Last 3 Encounters:  07/09/18 133 lb 3.2 oz (60.4 kg)  06/03/18 131 lb 6.4 oz (59.6 kg)  03/22/18 129 lb 1.9 oz (58.6 kg)     Lab Results  Component Value Date   WBC 4.3 12/02/2017   HGB 13.8 12/02/2017   HCT 40.1 12/02/2017   PLT 219.0 12/02/2017   GLUCOSE 98 06/25/2018   CHOL 296 (H) 06/25/2018   TRIG 102.0 06/25/2018   HDL 78.80 06/25/2018   LDLDIRECT 165.9 05/11/2013   LDLCALC 197 (H) 06/25/2018   ALT 24 06/25/2018   AST 35 06/25/2018   NA 141 06/25/2018   K 3.7 06/25/2018   CL 105 06/25/2018   CREATININE 0.79 06/25/2018   BUN 14 06/25/2018   CO2 25 06/25/2018   TSH 0.60 06/25/2018   INR 0.96 04/14/2016    Dg Bone Density  Result Date: 06/23/2018 EXAM: DUAL X-RAY ABSORPTIOMETRY (DXA) FOR BONE MINERAL DENSITY IMPRESSION: Technologist:: MTB Your patient Irving Lubbers completed a BMD test on 06/23/2018 using the Davis (analysis version: 14.10) manufactured by [employer]. The following summarizes the results of our evaluation. PATIENT BIOGRAPHICAL: Name: Maudene, Stotler Patient ID: [id number] Birth Date: 10-11-1945 Height: 63.0 in. Gender: Female Exam Date: 06/23/2018 Weight: 131.4 lbs. Indications: Caucasian, Height Loss, Hypothyroid, Postmenopausal Fractures: Treatments: Calcium, Multi-Vitamin with calcium, prempro, Synthroid, Vitamin D ASSESSMENT: The BMD measured at Forearm Radius 33% is 0.555 g/cm2 with a T-score of -3.7. This patient is considered OSTEOPOROTIC according to [facility]) criteria. The quality of the scan is good. L-3 was excluded due to degenerative changes. Site Region Measured Measured WHO Young Adult BMD Date        Age      Classification T-score AP Spine L1-L4 (L3) 06/23/2018 70.5 Osteopenia -2.2 0.911 g/cm2 AP Spine L1-L4 (L3) 02/21/2014 66.2 Osteopenia -1.7 0.962 g/cm2 DualFemur Total Left 06/23/2018 70.5 Osteopenia -2.1 0.744 g/cm2 DualFemur Total Left 02/21/2014 66.2 Osteopenia -1.6 0.812 g/cm2 DualFemur Total Mean 06/23/2018 70.5 Osteopenia -2.0 0.753 g/cm2 DualFemur Total Mean 02/21/2014 66.2 Osteopenia -1.5 0.824 g/cm2 Left Forearm Radius 33% 06/23/2018 70.5 Osteoporosis -3.7 0.555 g/cm2 Left Forearm Radius 33% 02/21/2014 66.2 Osteoporosis -2.8 0.628 g/cm2 World Health Organization [facility]) criteria for post-menopausal, Caucasian Women: Normal:       T-score at or above -1 SD Osteopenia:   T-score between -1 and -2.5 SD Osteoporosis: T-score at or below -2.5 SD RECOMMENDATIONS: 1. All patients should optimize calcium and vitamin D intake. 2. Consider FDA-approved medical therapies in postmenopausal women and men aged 31 years and older, based on the following: a. A hip or vertebral(clinical or morphometric) fracture b. T-score < -2.5 at the femoral neck or spine after appropriate evaluation to exclude secondary causes c. Low bone mass (T-score between -1.0 and -2.5 at the femoral neck or spine) and a 10-year probability of a hip fracture > 3% or a 10-year probability of a major osteoporosis-related fracture > 20% based on the [place]-adapted WHO algorithm d. Clinician judgment and/or patient preferences may indicate treatment for people with 10-year fracture probabilities above or below these levels FOLLOW-UP: People with diagnosed cases of osteoporosis or at high risk for fracture should have regular bone mineral density tests. For patients eligible for Medicare, routine testing is allowed once every 2 years. The testing frequency can be increased to one year for patients who have rapidly progressing disease, those who are receiving or discontinuing medical therapy to restore bone mass, or have additional risk factors. I have  reviewed  this report, and agree with the above findings. Mark A. Thornton Papas, M.D. [facility] Radiology Electronically Signed   By: Lavonia Dana M.D.   On: 06/23/2018 17:02       Assessment & Plan:   Problem List Items Addressed This Visit    Anemia    Follow cbc.       Aortic atherosclerosis ([place])    On crestor.        GERD (gastroesophageal reflux disease)    Controlled on current regimen.        Hypercholesterolemia    Plans to start taking crestor regularly.  Low cholesterol diet and exercise.  Follow lipid panel and liver function tests.        Hypertension    Blood pressure under good control.  Continue same medication regimen.  Follow pressures.  Follow metabolic panel.        Hypothyroidism    On thyroid replacement.  Follow tsh.       Osteoporosis    Discussed bone density results.  Discussed treatment options.  Agrees to reclast.  Discussed calcium and vitamin D.  Discussed weight bearing exercise.        Relevant Orders   Urinalysis, Routine w reflex microscopic (Completed)   VITAMIN D 25 Hydroxy (Vit-D Deficiency, Fractures) (Completed)    Other Visit Diagnoses    Left hip pain    -  Primary   Persistent pain.  Check xray.    Relevant Orders   DG HIP UNILAT WITH PELVIS 2-3 VIEWS LEFT (Completed)   Left-sided low back pain without sciatica, unspecified chronicity       Persistent pain.  check xray.     Relevant Orders   DG Lumbar Spine 2-3 Views (Completed)       Einar Pheasant, MD

## 2018-07-10 ENCOUNTER — Encounter: Payer: Self-pay | Admitting: Internal Medicine

## 2018-07-12 ENCOUNTER — Encounter: Payer: Self-pay | Admitting: Internal Medicine

## 2018-07-12 DIAGNOSIS — M810 Age-related osteoporosis without current pathological fracture: Secondary | ICD-10-CM | POA: Insufficient documentation

## 2018-07-12 NOTE — Assessment & Plan Note (Signed)
Follow cbc.  

## 2018-07-12 NOTE — Assessment & Plan Note (Signed)
Plans to start taking crestor regularly.  Low cholesterol diet and exercise.  Follow lipid panel and liver function tests.

## 2018-07-12 NOTE — Assessment & Plan Note (Signed)
On thyroid replacement.  Follow tsh.  

## 2018-07-12 NOTE — Assessment & Plan Note (Signed)
Controlled on current regimen.   

## 2018-07-12 NOTE — Assessment & Plan Note (Signed)
On crestor.   

## 2018-07-12 NOTE — Assessment & Plan Note (Signed)
Discussed bone density results.  Discussed treatment options.  Agrees to reclast.  Discussed calcium and vitamin D.  Discussed weight bearing exercise.

## 2018-07-12 NOTE — Assessment & Plan Note (Signed)
Blood pressure under good control.  Continue same medication regimen.  Follow pressures.  Follow metabolic panel.   

## 2018-07-13 ENCOUNTER — Other Ambulatory Visit: Payer: Self-pay | Admitting: Internal Medicine

## 2018-07-13 DIAGNOSIS — R1032 Left lower quadrant pain: Secondary | ICD-10-CM

## 2018-07-13 DIAGNOSIS — M25552 Pain in left hip: Secondary | ICD-10-CM

## 2018-07-13 NOTE — Progress Notes (Signed)
Order placed for physical therapy referral.

## 2018-07-29 ENCOUNTER — Encounter: Payer: Self-pay | Admitting: Internal Medicine

## 2018-08-16 ENCOUNTER — Other Ambulatory Visit: Payer: Self-pay

## 2018-08-16 ENCOUNTER — Ambulatory Visit: Payer: Medicare Other | Attending: Internal Medicine

## 2018-08-16 DIAGNOSIS — G8929 Other chronic pain: Secondary | ICD-10-CM | POA: Insufficient documentation

## 2018-08-16 DIAGNOSIS — M25552 Pain in left hip: Secondary | ICD-10-CM

## 2018-08-16 DIAGNOSIS — M545 Low back pain: Secondary | ICD-10-CM | POA: Diagnosis present

## 2018-08-16 DIAGNOSIS — M6281 Muscle weakness (generalized): Secondary | ICD-10-CM | POA: Diagnosis present

## 2018-08-16 NOTE — Therapy (Signed)
[facility] [address], [place], [address] Phone: [phone]   Fax:  [phone]  Physical Therapy Evaluation  Patient Details  Name: Savannah Cox MRN: [id number] Date of Birth: 15-Apr-1946 Referring Provider (PT): Einar Pheasant, MD   Encounter Date: 08/16/2018  PT End of Session - 08/16/18 1436    Visit Number  1    Number of Visits  13    Date for PT Re-Evaluation  09/30/18    Authorization Type  1    Authorization Time Period  of 10 progress report (08/16/2018)    PT Start Time  1436    PT Stop Time  1542    PT Time Calculation (min)  66 min    Activity Tolerance  Patient tolerated treatment well    Behavior During Therapy  [facility] for tasks assessed/performed       Past Medical History:  Diagnosis Date  . Allergic state   . Anemia   . Complication of anesthesia   . Diverticulosis   . GERD (gastroesophageal reflux disease)   . History of hiatal hernia   . History of kidney stones   . Hypercholesterolemia   . Hypertension   . Hypothyroidism   . Osteoporosis, postmenopausal   . PONV (postoperative nausea and vomiting)    nauseated with colonoscopy  . Shingles     Past Surgical History:  Procedure Laterality Date  . BREAST BIOPSY Left 90s   benign  . COLONOSCOPY WITH PROPOFOL N/A 01/28/2016   Procedure: COLONOSCOPY WITH PROPOFOL;  Surgeon: Lollie Sails, MD;  Location: [facility] ENDOSCOPY;  Service: Endoscopy;  Laterality: N/A;  . EXCISION METACARPAL MASS Right 04/30/2017   Procedure: EXCISION MUCOID CYST FIFTH FINGER RIGHT HAND;  Surgeon: Hessie Knows, MD;  Location: [facility] ORS;  Service: Orthopedics;  Laterality: Right;  . EYE SURGERY Bilateral 2010   cataracts    There were no vitals filed for this visit.   Subjective Assessment - 08/16/18 1441    Subjective  L posterior hip pain: 0/10 currently (pt sitting on chair), 8/10 at worst for the psat 3 months    Pertinent History  L  groin/hip pain. Pain is posterior hip.  Pain has been off and on for 6 months or more. Pain has gotten worse. Gradual onset.  No back pain.   Had PT for R hip with good results.   Pain in R hip before was in the same area.   Pt was trying to strengthen her rear end before.  Pain has worsened since onset 6 months ago. Feels stiff sometimes but walking helps it feel more loose.     Patient Stated Goals  Be able to step up better. Keep her L hip from worsening.     Currently in Pain?  No/denies    Pain Score  0-No pain    Pain Location  Hip    Pain Orientation  Left;Posterior    Pain Descriptors / Indicators  Aching    Pain Type  Chronic pain    Pain Onset  More than a month ago    Pain Frequency  Occasional    Aggravating Factors   stepping onto a step, or stepping with L LE. crossing her L leg over her R, prolonged walking (30-45 minutes)    Pain Relieving Factors  rest         [facility] PT Assessment - 08/16/18 1449      Assessment   Medical Diagnosis  L groin, L hip pain    Referring Provider (PT)  Einar Pheasant, MD    Onset Date/Surgical Date  07/13/18   Date PT referral signed. Chronic condition.    Hand Dominance  Right    Prior Therapy  Pt had PT for R hip with positive results.       Precautions   Precaution Comments  possible fall risk      Restrictions   Other Position/Activity Restrictions  no known weight bearing restrictions      Balance Screen   Has the patient fallen in the past 6 months  Yes    How many times?  1   Pt missed a step with R foot going up a step.    Has the patient had a decrease in activity level because of a fear of falling?   No    Is the patient reluctant to leave their home because of a fear of falling?   No      Home Film/video editor residence    Living Arrangements  Spouse/significant other    [place] to enter    Entrance Stairs-Number of Steps  2    Entrance Stairs-Rails  None    Home Layout  One level       Prior Function   Level of Independence  Independent    Vocation Requirements  PLOF: less difficulty negotiating stairs and walking longer distances      Observation/Other Assessments   Observations  (-) long sit test     Focus on Therapeutic Outcomes (FOTO)   L hip FOTO: 54      Posture/Postural Control   Posture Comments  Protracted neck, protracted shoulders, R shoulder lower, light L lateral shift, R iliac crest higher. Slight L posterior pelvic tilt.       AROM   Overall AROM Comments  supine hip IR at 90/90 position: L 25 degrees, R 35 degrees     Lumbar Flexion  WFL with L posterior hip pull    Lumbar Extension  WFL    Lumbar - Right Side Bend  WFL    Lumbar - Left Side Bend  WFL    Lumbar - Right Rotation  WFL   Performed in sitting    Lumbar - Left Rotation  limited with L low back pain   Performed in sitting      Strength   Right Hip Flexion  4/5    Right Hip Extension  4-/5   with hamstring cramp   Right Hip ABduction  4/5    Left Hip Flexion  4/5    Left Hip Extension  3+/5    Left Hip ABduction  4/5    Right Knee Flexion  5/5    Right Knee Extension  5/5    Left Knee Flexion  5/5    Left Knee Extension  5/5      Palpation   Palpation comment  TTP L greater trochanter in sitting and in R S/L, no TTP when in prone      Ambulation/Gait   Gait Comments  trendlenberg                Objective measurements completed on examination: See above findings.      Added medication: bine infusion to be given 09/01/2018  Pt states blood pressure is controlled  No latex band allergies   Osteoporosis.     TTP L greater trochanter with reproduction  of symptoms in sitting  No TTP to L greater trochanter in prone      Therapeutic exercise  Prone on elbows x 2 minutes    Supine L hip IR stretch in "figure 4 position" 1 minute x 3   Improved supine L hip IR at 90/90 to 35 degrees   Decreased difficulty with crossing leg    Reviewed and given  as part of her HEP. Pt demonstrated and verbalized understanding. Handout provided.   Seated L hip IR 10x3  Possible decrease in L symptoms    Ascending and descending 4 regular steps with B UE assist   No pain going down, L hip pain with going up  Decreased femoral control observed   Then with emphasis on femoral control about 3 times. Decreased L hip pain   Seated L hip extension isometrics 10x2 with 5 second holds to promote glute strength Reviewed and given as part of her HEP. Pt demonstrated and verbalized understanding. Handout provided.    Then stair negotiation 3x with B UE assist and femoral control   No L hip pain.    Improved exercise technique, movement at target joints, use of target muscles after mod verbal, visual, tactile cues.         Patient is a 73 year old female who came to physical therapy secondary to L hip pain since about 6 months ago.  She also presents with TTP L greater trochanter, B hip weakness, decreased femoral control, decreased L hip IR ROM, altered gait pattern and posture, and difficulty performing functional tasks such as stair negotiation, putting weight onto her L LE, walking for long distances and crossing her L leg over her R. Pt will benefit from skilled physical therapy services to address the aforementioned deficits.     PT Education - 08/16/18 1558    Education provided  Yes    Education Details  Ther-ex, HEP, plan of care    Person(s) Educated  Patient    Methods  Explanation;Demonstration;Tactile cues;Verbal cues;Handout    Comprehension  Returned demonstration;Verbalized understanding       PT Short Term Goals - 08/16/18 1559      PT SHORT TERM GOAL #1   Title  Patient will be independent with her HEP to decrease pain, improve strength and function.     Baseline  Pt has started her HEP (08/16/2018)    Time  3    Period  Weeks    Status  New    Target Date  09/09/18        PT Long Term Goals - 08/16/18 1600      PT LONG  TERM GOAL #1   Title  Pt will have a decrease in L hip pain to 3/10 or less at worst to decrease difficulty with stair negotiation and prolonged walking.     Baseline  8/10 L hip pain at worst for the past 3 months (08/16/2018)    Time  6    Period  Weeks    Status  New    Target Date  09/30/18      PT LONG TERM GOAL #2   Title  Patient will improve B glute med and max strength to promote femoral control and decrease pain with stair negotiation.     Time  6    Period  Weeks    Status  New    Target Date  09/30/18      PT LONG TERM GOAL #3  Title  Patient will improve her hip FOTO score by 10 points as a demonstration of improved function.     Baseline  L hip FOTO 54 (08/16/2018)    Time  6    Period  Weeks    Status  New    Target Date  09/30/18             Plan - 08/16/18 1550    Clinical Impression Statement  Patient is a 73 year old female who came to physical therapy secondary to L hip pain since about 6 months ago.  She also presents with TTP L greater trochanter, B hip weakness, decreased femoral control, decreased L hip IR ROM, altered gait pattern and posture, and difficulty performing functional tasks such as stair negotiation, putting weight onto her L LE, walking for long distances and crossing her L leg over her R. Pt will benefit from skilled physical therapy services to address the aforementioned deficits.     Personal Factors and Comorbidities  Age;Comorbidity 1;Time since onset of injury/illness/exacerbation    Comorbidities  osteoporosis    Examination-Activity Limitations  Stairs;Other;Squat   walking, crossing L leg over R, stepping up onto something like a curb   Stability/Clinical Decision Making  Evolving/Moderate complexity   pt states pain has worsened since onset   Clinical Decision Making  Moderate    Rehab Potential  Fair    Clinical Impairments Affecting Rehab Potential  (-) age, pain, hip weakness; (+) motivated    PT Frequency  2x / week    PT  Duration  6 weeks    PT Treatment/Interventions  Gait training;Stair training;Functional mobility training;Therapeutic activities;Therapeutic exercise;Balance training;Neuromuscular re-education;Patient/family education;Manual techniques;Dry needling;Aquatic Therapy;Electrical Stimulation;Iontophoresis 4mg /ml Dexamethasone    PT Next Visit Plan  Improve hip IR, glute, trunk strength, lumbo/pelvic/femoral control, manual techniques, modalities PRN    Consulted and Agree with Plan of Care  Patient       Patient will benefit from skilled therapeutic intervention in order to improve the following deficits and impairments:  Pain, Postural dysfunction, Improper body mechanics, Difficulty walking, Decreased strength, Decreased range of motion  Visit Diagnosis: Pain in left hip - Plan: PT plan of care cert/re-cert  Muscle weakness (generalized) - Plan: PT plan of care cert/re-cert  Chronic low back pain, unspecified back pain laterality, unspecified whether sciatica present - Plan: PT plan of care cert/re-cert     Problem List Patient Active Problem List   Diagnosis Date Noted  . Osteoporosis 07/12/2018  . Bilateral hip pain 06/12/2018  . Aortic atherosclerosis ([place]) 10/10/2017  . Finger pain, right 07/05/2017  . Acute colitis 04/17/2016  . Hypokalemia 04/17/2016  . Headache 04/17/2016  . GIB (gastrointestinal bleeding) 04/14/2016  . History of colonic polyps 01/31/2016  . Change in bowel movement 10/21/2015  . Pain of both thumbs 01/14/2015  . Right hip pain 01/10/2015  . Health care maintenance 09/04/2014  . Abnormal mammogram 09/25/2013  . Abdominal pain 02/22/2013  . Anemia 05/01/2012  . GERD (gastroesophageal reflux disease) 05/01/2012  . Hematuria 05/01/2012  . Hypothyroidism 05/01/2012  . Hypercholesterolemia 05/01/2012  . Hypertension 05/01/2012    Joneen Boers PT, DPT   08/16/2018, 4:27 PM  [facility] [address] [place], [address] Phone: [phone]   Fax:  [phone]  Name: Savannah Cox MRN: [id number] Date of Birth: 06/13/1946

## 2018-08-16 NOTE — Patient Instructions (Addendum)
Seated hip extension isometrics   Sitting on a chair,    Squeeze your rear end muscles together and press your L foot onto the floor.     Hold for 5 seconds    Repeat 10 times   Perform 3 sets daily.      This is a corrective exercise. Once you no longer have symptoms, you can stop.       Supine L hip IR stretch in "figure 4 position" 1 minute x 3  reveiwed and given as part of her HEP. Pt demonstrated and verbalized understanding. Handout provided.

## 2018-08-18 ENCOUNTER — Ambulatory Visit: Payer: Medicare Other

## 2018-08-18 DIAGNOSIS — M545 Low back pain: Secondary | ICD-10-CM

## 2018-08-18 DIAGNOSIS — M25552 Pain in left hip: Secondary | ICD-10-CM

## 2018-08-18 DIAGNOSIS — M6281 Muscle weakness (generalized): Secondary | ICD-10-CM

## 2018-08-18 DIAGNOSIS — G8929 Other chronic pain: Secondary | ICD-10-CM

## 2018-08-18 NOTE — Therapy (Signed)
[facility] [address] [place], [address] Phone: [phone]   Fax:  [phone]  Physical Therapy Treatment  Patient Details  Name: Savannah Cox MRN: [id number] Date of Birth: 1946-03-25 Referring Provider (PT): Einar Pheasant, MD   Encounter Date: 08/18/2018  PT End of Session - 08/18/18 1433    Visit Number  2    Number of Visits  13    Date for PT Re-Evaluation  09/30/18    Authorization Type  2    Authorization Time Period  of 10 progress report (08/16/2018)    PT Start Time  1433    PT Stop Time  1514    PT Time Calculation (min)  41 min    Activity Tolerance  Patient tolerated treatment well    Behavior During Therapy  [facility] for tasks assessed/performed       Past Medical History:  Diagnosis Date  . Allergic state   . Anemia   . Complication of anesthesia   . Diverticulosis   . GERD (gastroesophageal reflux disease)   . History of hiatal hernia   . History of kidney stones   . Hypercholesterolemia   . Hypertension   . Hypothyroidism   . Osteoporosis, postmenopausal   . PONV (postoperative nausea and vomiting)    nauseated with colonoscopy  . Shingles     Past Surgical History:  Procedure Laterality Date  . BREAST BIOPSY Left 90s   benign  . COLONOSCOPY WITH PROPOFOL N/A 01/28/2016   Procedure: COLONOSCOPY WITH PROPOFOL;  Surgeon: Lollie Sails, MD;  Location: [facility] ENDOSCOPY;  Service: Endoscopy;  Laterality: N/A;  . EXCISION METACARPAL MASS Right 04/30/2017   Procedure: EXCISION MUCOID CYST FIFTH FINGER RIGHT HAND;  Surgeon: Hessie Knows, MD;  Location: [facility] ORS;  Service: Orthopedics;  Laterality: Right;  . EYE SURGERY Bilateral 2010   cataracts    There were no vitals filed for this visit.  Subjective Assessment - 08/18/18 1434    Subjective  L hip is pretty good. No pain currently.  Both rear end muscles were sore after last session.     Pertinent History  L groin/hip  pain. Pain is posterior hip.  Pain has been off and on for 6 months or more. Pain has gotten worse. Gradual onset.  No back pain.   Had PT for R hip with good results.   Pain in R hip before was in the same area.   Pt was trying to strengthen her rear end before.  Pain has worsened since onset 6 months ago. Feels stiff sometimes but walking helps it feel more loose.     Patient Stated Goals  Be able to step up better. Keep her L hip from worsening.     Currently in Pain?  No/denies    Pain Score  0-No pain    Pain Onset  More than a month ago                               PT Education - 08/18/18 1437    Education provided  Yes    Education Details  ther-ex    [employer]) Educated  Patient    Methods  Explanation;Demonstration;Tactile cues;Verbal cues    Comprehension  Returned demonstration;Verbalized understanding      Objective   Added medication: bone infusion to be given 09/01/2018  Pt states blood pressure is controlled  No  latex band allergies   Osteoporosis.     TTP L greater trochanter with reproduction of symptoms in sitting             No TTP to L greater trochanter in prone    Therapeutic exercise   Seated L hip IR 10x2 with 5 seconds             Running man with one UE assist. Good glute max muscle use felt  L LE 10x3  Standing leg press resisting double blue band with B UE assist   L LE 10x3. Good glute muscle use felt  Wedding march 32 ft with yellow band resist around thighs  Then 32 ft around ankles yellow band  Side stepping with band around thighs  32 ft to the R and 32 ft to the L. glute med muscle use felt   Forward step up onto 4 inch step with L LE 10x without UE assist. Emphasis on femoral control.   Then with 5 lbs on R hand 10x without UE assist   Lateral step down with light touch assist, emphasis on femoral control   L LE use 10x3   Supine L hip extension isometrics with L LE straight, R knee bent. 10x3  with 5 second holds   Good glute muscle use felt  Bridge 5x  S/L L hip reverse clamshell 10x3   Supine L hip IR stretch, figure 4 position, 30 seconds x 3  Improved exercise technique, movement at target joints, use of target muscles after mod verbal, visual, tactile cues    Response to treatment Good muscle use felt with exercises. Able to perform all exercises without aggravation of L hip symptoms.    Clinical impression Decreasing difficulty crossing her L leg over her R observed. Continued working on improving glute strength and posterior translation of L femoral head (hip IR exercises) as well as femoral control to help decrease L hip pain with standing tasks such as stair negotiation. Pt tolerated session well without aggravation of symptoms. Pt will benefit from continued skilled physical therapy services to address the aforementioned deficits.      .  PT Short Term Goals - 08/16/18 1559      PT SHORT TERM GOAL #1   Title  Patient will be independent with her HEP to decrease pain, improve strength and function.     Baseline  Pt has started her HEP (08/16/2018)    Time  3    Period  Weeks    Status  New    Target Date  09/09/18        PT Long Term Goals - 08/16/18 1600      PT LONG TERM GOAL #1   Title  Pt will have a decrease in L hip pain to 3/10 or less at worst to decrease difficulty with stair negotiation and prolonged walking.     Baseline  8/10 L hip pain at worst for the past 3 months (08/16/2018)    Time  6    Period  Weeks    Status  New    Target Date  09/30/18      PT LONG TERM GOAL #2   Title  Patient will improve B glute med and max strength to promote femoral control and decrease pain with stair negotiation.     Time  6    Period  Weeks    Status  New    Target Date  09/30/18  PT LONG TERM GOAL #3   Title  Patient will improve her hip FOTO score by 10 points as a demonstration of improved function.     Baseline  L hip FOTO 54 (08/16/2018)     Time  6    Period  Weeks    Status  New    Target Date  09/30/18            Plan - 08/18/18 1437    Clinical Impression Statement  Decreasing difficulty crossing her L leg over her R observed. Continued working on improving glute strength and posterior translation of L femoral head (hip IR exercises) as well as femoral control to help decrease L hip pain with standing tasks such as stair negotiation. Pt tolerated session well without aggravation of symptoms. Pt will benefit from continued skilled physical therapy services to address the aforementioned deficits.     Personal Factors and Comorbidities  Age;Comorbidity 1;Time since onset of injury/illness/exacerbation    Comorbidities  osteoporosis    Examination-Activity Limitations  Stairs;Other;Squat   walking, crossing L leg over R, stepping up onto something like a curb   Stability/Clinical Decision Making  Evolving/Moderate complexity   pt states pain has worsened since onset   Rehab Potential  Fair    Clinical Impairments Affecting Rehab Potential  (-) age, pain, hip weakness; (+) motivated    PT Frequency  2x / week    PT Duration  6 weeks    PT Treatment/Interventions  Gait training;Stair training;Functional mobility training;Therapeutic activities;Therapeutic exercise;Balance training;Neuromuscular re-education;Patient/family education;Manual techniques;Dry needling;Aquatic Therapy;Electrical Stimulation;Iontophoresis 4mg /ml Dexamethasone    PT Next Visit Plan  Improve hip IR, glute, trunk strength, lumbo/pelvic/femoral control, manual techniques, modalities PRN    Consulted and Agree with Plan of Care  Patient       Patient will benefit from skilled therapeutic intervention in order to improve the following deficits and impairments:  Pain, Postural dysfunction, Improper body mechanics, Difficulty walking, Decreased strength, Decreased range of motion  Visit Diagnosis: Pain in left hip  Muscle weakness  (generalized)  Chronic low back pain, unspecified back pain laterality, unspecified whether sciatica present     Problem List Patient Active Problem List   Diagnosis Date Noted  . Osteoporosis 07/12/2018  . Bilateral hip pain 06/12/2018  . Aortic atherosclerosis ([place]) 10/10/2017  . Finger pain, right 07/05/2017  . Acute colitis 04/17/2016  . Hypokalemia 04/17/2016  . Headache 04/17/2016  . GIB (gastrointestinal bleeding) 04/14/2016  . History of colonic polyps 01/31/2016  . Change in bowel movement 10/21/2015  . Pain of both thumbs 01/14/2015  . Right hip pain 01/10/2015  . Health care maintenance 09/04/2014  . Abnormal mammogram 09/25/2013  . Abdominal pain 02/22/2013  . Anemia 05/01/2012  . GERD (gastroesophageal reflux disease) 05/01/2012  . Hematuria 05/01/2012  . Hypothyroidism 05/01/2012  . Hypercholesterolemia 05/01/2012  . Hypertension 05/01/2012   Joneen Boers PT, DPT   08/18/2018, 3:28 PM  [facility] [address], [place], [address] Phone: [phone]   Fax:  [phone]  Name: Ryla Cauthon MRN: [id number] Date of Birth: 05/16/1946

## 2018-08-23 ENCOUNTER — Ambulatory Visit: Payer: Medicare Other

## 2018-08-23 DIAGNOSIS — M25552 Pain in left hip: Secondary | ICD-10-CM | POA: Diagnosis not present

## 2018-08-23 DIAGNOSIS — M6281 Muscle weakness (generalized): Secondary | ICD-10-CM

## 2018-08-23 DIAGNOSIS — M545 Low back pain: Secondary | ICD-10-CM

## 2018-08-23 DIAGNOSIS — G8929 Other chronic pain: Secondary | ICD-10-CM

## 2018-08-23 NOTE — Therapy (Signed)
[facility] [address], [place], [address] Phone: [phone]   Fax:  [phone]  Physical Therapy Treatment  Patient Details  Name: Savannah Cox MRN: [id number] Date of Birth: Sep 08, 1945 Referring Provider (PT): Einar Pheasant, MD   Encounter Date: 08/23/2018  PT End of Session - 08/23/18 1432    Visit Number  3    Number of Visits  13    Date for PT Re-Evaluation  09/30/18    Authorization Type  3    Authorization Time Period  of 10 progress report (08/16/2018)    PT Start Time  1432    PT Stop Time  1512    PT Time Calculation (min)  40 min    Activity Tolerance  Patient tolerated treatment well    Behavior During Therapy  [facility] for tasks assessed/performed       Past Medical History:  Diagnosis Date  . Allergic state   . Anemia   . Complication of anesthesia   . Diverticulosis   . GERD (gastroesophageal reflux disease)   . History of hiatal hernia   . History of kidney stones   . Hypercholesterolemia   . Hypertension   . Hypothyroidism   . Osteoporosis, postmenopausal   . PONV (postoperative nausea and vomiting)    nauseated with colonoscopy  . Shingles     Past Surgical History:  Procedure Laterality Date  . BREAST BIOPSY Left 90s   benign  . COLONOSCOPY WITH PROPOFOL N/A 01/28/2016   Procedure: COLONOSCOPY WITH PROPOFOL;  Surgeon: Lollie Sails, MD;  Location: [facility] ENDOSCOPY;  Service: Endoscopy;  Laterality: N/A;  . EXCISION METACARPAL MASS Right 04/30/2017   Procedure: EXCISION MUCOID CYST FIFTH FINGER RIGHT HAND;  Surgeon: Hessie Knows, MD;  Location: [facility] ORS;  Service: Orthopedics;  Laterality: Right;  . EYE SURGERY Bilateral 2010   cataracts    There were no vitals filed for this visit.  Subjective Assessment - 08/23/18 1433    Subjective  L hip feels pretty good. No pain. Stepping onto a step is better. Does not feel the pressure. 7/10 at most for the past 7 days  (still sore getting up in the morning, after that it gets better as long as she keeps moving it).  Gets about 8 hours of sleep per night.     Pertinent History  L groin/hip pain. Pain is posterior hip.  Pain has been off and on for 6 months or more. Pain has gotten worse. Gradual onset.  No back pain.   Had PT for R hip with good results.   Pain in R hip before was in the same area.   Pt was trying to strengthen her rear end before.  Pain has worsened since onset 6 months ago. Feels stiff sometimes but walking helps it feel more loose.     Patient Stated Goals  Be able to step up better. Keep her L hip from worsening.     Currently in Pain?  No/denies    Pain Score  0-No pain    Pain Onset  More than a month ago                              Objective    Added medication: bone infusion to be given 09/01/2018  Pt states blood pressure is controlled  No latex band allergies   Osteoporosis.    TTP L  greater trochanter with reproduction of symptoms in sitting No TTP to L greater trochanter in prone   Therapeutic exercise  Ascending and descending 4 regular steps with B UE assist, reciprocal pattern 1x. No pain  Then without UE assist 2x. No pain, improved B LE femoral control.   Running man with one UE assist.             L LE 10x3  Seated hip hinging 10x2. Good form overall  Dead lift, no weight 10x3. Good muscle use felt. Decreased back discomfort with cues for hip hinging.   Standing L LE leg press, double blue band with B UE assist 10x3  Quadruped posterior hip capsule stretch   10x10 seconds   Supine L hip extension isometrics with L LE straight, R knee bent. 10x2 with 10 second holds    Nustep interval for 7 minutes: 1 min regular pace, 1 min fast pace, 1 min regular pace, 1 min fast pace, 3 minute warm down to promote hip movement and flush inflammatory chemicals away. Cues for technique during exercise  Forward step up  onto 4 inch step with 1 riser with L LE with R hip flexion at top of step. One UE assist 10x  Then without UE assist 10x2  Seated hip adduction ball and glute max squeeze 10x10 seconds   Improved exercise technique, movement at target joints, use of target muscles after min to mod verbal, visual, tactile cues.   Response to treatment Good muscle use felt with exercises. No complain of increased L hip pain.   Clinical impression Pt making progress with decreased hip pain. Able to ascend and descend stairs with improved femoral control and no complain of pain. Still demonstrates elevated hip pain in the morning when pt wakes up but improves with movement. Continued working on LE strengthening to help decrease hip pain, improve femoral control. Worked on movement as well to promote joint nutrition and decrease hip pain. Pt will benefit from continued skilled physical therapy services to decrease pain, improve strength and function.      PT Education - 08/23/18 1437    Education provided  Yes    Education Details  ther-ex    [employer]) Educated  Patient    Methods  Explanation;Demonstration;Tactile cues;Verbal cues    Comprehension  Returned demonstration;Verbalized understanding       PT Short Term Goals - 08/16/18 1559      PT SHORT TERM GOAL #1   Title  Patient will be independent with her HEP to decrease pain, improve strength and function.     Baseline  Pt has started her HEP (08/16/2018)    Time  3    Period  Weeks    Status  New    Target Date  09/09/18        PT Long Term Goals - 08/16/18 1600      PT LONG TERM GOAL #1   Title  Pt will have a decrease in L hip pain to 3/10 or less at worst to decrease difficulty with stair negotiation and prolonged walking.     Baseline  8/10 L hip pain at worst for the past 3 months (08/16/2018)    Time  6    Period  Weeks    Status  New    Target Date  09/30/18      PT LONG TERM GOAL #2   Title  Patient will improve B glute med  and max strength to promote femoral control and  decrease pain with stair negotiation.     Time  6    Period  Weeks    Status  New    Target Date  09/30/18      PT LONG TERM GOAL #3   Title  Patient will improve her hip FOTO score by 10 points as a demonstration of improved function.     Baseline  L hip FOTO 54 (08/16/2018)    Time  6    Period  Weeks    Status  New    Target Date  09/30/18            Plan - 08/23/18 1428    Clinical Impression Statement  Pt making progress with decreased hip pain. Able to ascend and descend stairs with improved femoral control and no complain of pain. Still demonstrates elevated hip pain in the morning when pt wakes up but improves with movement. Continued working on LE strengthening to help decrease hip pain, improve femoral control. Worked on movement as well to promote joint nutrition and decrease hip pain. Pt will benefit from continued skilled physical therapy services to decrease pain, improve strength and function.     Personal Factors and Comorbidities  Age;Comorbidity 1;Time since onset of injury/illness/exacerbation    Comorbidities  osteoporosis    Examination-Activity Limitations  Stairs;Other;Squat   walking, crossing L leg over R, stepping up onto something like a curb   Stability/Clinical Decision Making  Evolving/Moderate complexity   pt states pain has worsened since onset   Rehab Potential  Fair    Clinical Impairments Affecting Rehab Potential  (-) age, pain, hip weakness; (+) motivated    PT Frequency  2x / week    PT Duration  6 weeks    PT Treatment/Interventions  Gait training;Stair training;Functional mobility training;Therapeutic activities;Therapeutic exercise;Balance training;Neuromuscular re-education;Patient/family education;Manual techniques;Dry needling;Aquatic Therapy;Electrical Stimulation;Iontophoresis 4mg /ml Dexamethasone    PT Next Visit Plan  Improve hip IR, glute, trunk strength, lumbo/pelvic/femoral control,  manual techniques, modalities PRN    Consulted and Agree with Plan of Care  Patient       Patient will benefit from skilled therapeutic intervention in order to improve the following deficits and impairments:  Pain, Postural dysfunction, Improper body mechanics, Difficulty walking, Decreased strength, Decreased range of motion  Visit Diagnosis: Pain in left hip  Muscle weakness (generalized)  Chronic low back pain, unspecified back pain laterality, unspecified whether sciatica present     Problem List Patient Active Problem List   Diagnosis Date Noted  . Osteoporosis 07/12/2018  . Bilateral hip pain 06/12/2018  . Aortic atherosclerosis ([place]) 10/10/2017  . Finger pain, right 07/05/2017  . Acute colitis 04/17/2016  . Hypokalemia 04/17/2016  . Headache 04/17/2016  . GIB (gastrointestinal bleeding) 04/14/2016  . History of colonic polyps 01/31/2016  . Change in bowel movement 10/21/2015  . Pain of both thumbs 01/14/2015  . Right hip pain 01/10/2015  . Health care maintenance 09/04/2014  . Abnormal mammogram 09/25/2013  . Abdominal pain 02/22/2013  . Anemia 05/01/2012  . GERD (gastroesophageal reflux disease) 05/01/2012  . Hematuria 05/01/2012  . Hypothyroidism 05/01/2012  . Hypercholesterolemia 05/01/2012  . Hypertension 05/01/2012   Joneen Boers PT, DPT   08/23/2018, 7:59 PM  [facility] [address], [place], [address] Phone: [phone]   Fax:  [phone]  Name: Savannah Cox MRN: [id number] Date of Birth: [birth year]

## 2018-08-25 ENCOUNTER — Other Ambulatory Visit: Payer: Self-pay

## 2018-08-25 ENCOUNTER — Ambulatory Visit: Payer: Medicare Other

## 2018-08-25 DIAGNOSIS — M25552 Pain in left hip: Secondary | ICD-10-CM | POA: Diagnosis not present

## 2018-08-25 DIAGNOSIS — M6281 Muscle weakness (generalized): Secondary | ICD-10-CM

## 2018-08-25 DIAGNOSIS — M545 Low back pain: Secondary | ICD-10-CM

## 2018-08-25 DIAGNOSIS — G8929 Other chronic pain: Secondary | ICD-10-CM

## 2018-08-25 NOTE — Therapy (Signed)
[facility] [address], [place], [address] Phone: [phone]   Fax:  [phone]  Physical Therapy Treatment  Patient Details  Name: Savannah Cox MRN: [id number] Date of Birth: 1945/10/18 Referring Provider (PT): Einar Pheasant, MD   Encounter Date: 08/25/2018  PT End of Session - 08/25/18 1434    Visit Number  4    Number of Visits  13    Date for PT Re-Evaluation  09/30/18    Authorization Type  4    Authorization Time Period  of 10 progress report (08/16/2018)    PT Start Time  1434    PT Stop Time  1514    PT Time Calculation (min)  40 min    Activity Tolerance  Patient tolerated treatment well    Behavior During Therapy  [facility] for tasks assessed/performed       Past Medical History:  Diagnosis Date  . Allergic state   . Anemia   . Complication of anesthesia   . Diverticulosis   . GERD (gastroesophageal reflux disease)   . History of hiatal hernia   . History of kidney stones   . Hypercholesterolemia   . Hypertension   . Hypothyroidism   . Osteoporosis, postmenopausal   . PONV (postoperative nausea and vomiting)    nauseated with colonoscopy  . Shingles     Past Surgical History:  Procedure Laterality Date  . BREAST BIOPSY Left 90s   benign  . COLONOSCOPY WITH PROPOFOL N/A 01/28/2016   Procedure: COLONOSCOPY WITH PROPOFOL;  Surgeon: Lollie Sails, MD;  Location: [facility] ENDOSCOPY;  Service: Endoscopy;  Laterality: N/A;  . EXCISION METACARPAL MASS Right 04/30/2017   Procedure: EXCISION MUCOID CYST FIFTH FINGER RIGHT HAND;  Surgeon: Hessie Knows, MD;  Location: [facility] ORS;  Service: Orthopedics;  Laterality: Right;  . EYE SURGERY Bilateral 2010   cataracts    There were no vitals filed for this visit.  Subjective Assessment - 08/25/18 1435    Subjective  L hip feels pretty good. Stiff after a 2 hour car ride.  Pt states going up and down her steps at home is a lot better.      Pertinent History  L groin/hip pain. Pain is posterior hip.  Pain has been off and on for 6 months or more. Pain has gotten worse. Gradual onset.  No back pain.   Had PT for R hip with good results.   Pain in R hip before was in the same area.   Pt was trying to strengthen her rear end before.  Pain has worsened since onset 6 months ago. Feels stiff sometimes but walking helps it feel more loose.     Patient Stated Goals  Be able to step up better. Keep her L hip from worsening.     Currently in Pain?  No/denies    Pain Score  0-No pain    Pain Onset  More than a month ago                               PT Education - 08/25/18 1438    Education provided  Yes    Education Details  ther-ex    [employer]) Educated  Patient    Methods  Explanation;Demonstration;Tactile cues;Verbal cues    Comprehension  Returned demonstration;Verbalized understanding      Objective    Added medication: bone infusion to be given  09/01/2018  Pt states blood pressure is controlled  No latex band allergies   Osteoporosis.    TTP L greater trochanter with reproduction of symptoms in sitting No TTP to L greater trochanter in prone   Therapeutic exercise    Standing L LE leg press, double blue band with B UE assist 10x3   Running man with one UE assist. L LE 10x3  Reviewed and given as part of her HEP. Pt demonstrated and verbalized understanding.   Dead lift, no weight 10x2. Good muscle use felt. Decreased back discomfort with cues for hip hinging.  Then with 3 lbs each hand (6 lbs total) 10x  Forward step up onto bosu with one UE assist  L LE 10x3  Lateral step up onto bosu with one UE assiste  L LE 10x3  Supine L hip extension isometrics with L LE straight, R knee bent. 10x2 with 10 second holds   Prone glute max extension with isometric L knee extension 5x  Hamstring cramp  Prone L hip extension 10x2  S/L L hip abduction  10x3  R S/L reverse clamshells 10x3 L   Supine bridge 10x3  Leg press seat 2 plate 35   Double legs 10x  Improved exercise technique, movement at target joints, use of target muscles after min to mod verbal, visual, tactile cues.   Response to treatment Good muscle use felt with exercises. No complain of increased L hip pain.   Clinical impression Continued working on glute med and max strengthening as well as femoral control to promote proper positioning and mechanics at the hip. Pt making progress with improved function based on subjective reports of decreased difficulty with stair negotiation. Pt will benefit from continued skilled physical therapy services to decrease pain, improve strength and function.     PT Short Term Goals - 08/16/18 1559      PT SHORT TERM GOAL #1   Title  Patient will be independent with her HEP to decrease pain, improve strength and function.     Baseline  Pt has started her HEP (08/16/2018)    Time  3    Period  Weeks    Status  New    Target Date  09/09/18        PT Long Term Goals - 08/16/18 1600      PT LONG TERM GOAL #1   Title  Pt will have a decrease in L hip pain to 3/10 or less at worst to decrease difficulty with stair negotiation and prolonged walking.     Baseline  8/10 L hip pain at worst for the past 3 months (08/16/2018)    Time  6    Period  Weeks    Status  New    Target Date  09/30/18      PT LONG TERM GOAL #2   Title  Patient will improve B glute med and max strength to promote femoral control and decrease pain with stair negotiation.     Time  6    Period  Weeks    Status  New    Target Date  09/30/18      PT LONG TERM GOAL #3   Title  Patient will improve her hip FOTO score by 10 points as a demonstration of improved function.     Baseline  L hip FOTO 54 (08/16/2018)    Time  6    Period  Weeks    Status  New    Target Date  09/30/18            Plan - 08/25/18 1457    Clinical Impression Statement  Continued  working on glute med and max strengthening as well as femoral control to promote proper positioning and mechanics at the hip. Pt making progress with improved function based on subjective reports of decreased difficulty with stair negotiation. Pt will benefit from continued skilled physical therapy services to decrease pain, improve strength and function.     Personal Factors and Comorbidities  Age;Comorbidity 1;Time since onset of injury/illness/exacerbation    Comorbidities  osteoporosis    Examination-Activity Limitations  Stairs;Other;Squat   walking, crossing L leg over R, stepping up onto something like a curb   Stability/Clinical Decision Making  Evolving/Moderate complexity   pt states pain has worsened since onset   Rehab Potential  Fair    Clinical Impairments Affecting Rehab Potential  (-) age, pain, hip weakness; (+) motivated    PT Frequency  2x / week    PT Duration  6 weeks    PT Treatment/Interventions  Gait training;Stair training;Functional mobility training;Therapeutic activities;Therapeutic exercise;Balance training;Neuromuscular re-education;Patient/family education;Manual techniques;Dry needling;Aquatic Therapy;Electrical Stimulation;Iontophoresis 4mg /ml Dexamethasone    PT Next Visit Plan  Improve hip IR, glute, trunk strength, lumbo/pelvic/femoral control, manual techniques, modalities PRN    Consulted and Agree with Plan of Care  Patient       Patient will benefit from skilled therapeutic intervention in order to improve the following deficits and impairments:  Pain, Postural dysfunction, Improper body mechanics, Difficulty walking, Decreased strength, Decreased range of motion  Visit Diagnosis: Pain in left hip  Muscle weakness (generalized)  Chronic low back pain, unspecified back pain laterality, unspecified whether sciatica present     Problem List Patient Active Problem List   Diagnosis Date Noted  . Osteoporosis 07/12/2018  . Bilateral hip pain  06/12/2018  . Aortic atherosclerosis ([place]) 10/10/2017  . Finger pain, right 07/05/2017  . Acute colitis 04/17/2016  . Hypokalemia 04/17/2016  . Headache 04/17/2016  . GIB (gastrointestinal bleeding) 04/14/2016  . History of colonic polyps 01/31/2016  . Change in bowel movement 10/21/2015  . Pain of both thumbs 01/14/2015  . Right hip pain 01/10/2015  . Health care maintenance 09/04/2014  . Abnormal mammogram 09/25/2013  . Abdominal pain 02/22/2013  . Anemia 05/01/2012  . GERD (gastroesophageal reflux disease) 05/01/2012  . Hematuria 05/01/2012  . Hypothyroidism 05/01/2012  . Hypercholesterolemia 05/01/2012  . Hypertension 05/01/2012    Joneen Boers PT, DPT   08/25/2018, 3:16 PM  [facility] [address] [place], [address] Phone: [phone]   Fax:  [phone]  Name: Savannah Cox MRN: [id number] Date of Birth: 18-Feb-1946

## 2018-08-26 ENCOUNTER — Other Ambulatory Visit: Payer: Self-pay

## 2018-08-30 ENCOUNTER — Ambulatory Visit: Payer: Medicare Other

## 2018-08-30 ENCOUNTER — Other Ambulatory Visit: Payer: Self-pay

## 2018-08-30 DIAGNOSIS — M25552 Pain in left hip: Secondary | ICD-10-CM

## 2018-08-30 DIAGNOSIS — M545 Low back pain, unspecified: Secondary | ICD-10-CM

## 2018-08-30 DIAGNOSIS — M6281 Muscle weakness (generalized): Secondary | ICD-10-CM

## 2018-08-30 DIAGNOSIS — G8929 Other chronic pain: Secondary | ICD-10-CM

## 2018-08-30 NOTE — Therapy (Signed)
[place] [facility] [address], [place], [address] Phone: [phone]   Fax:  [phone]  Physical Therapy Treatment  Patient Details  Name: Savannah Cox MRN: [id number] Date of Birth: 02-Mar-1946 Referring Provider (PT): Einar Pheasant, MD   Encounter Date: 08/30/2018  PT End of Session - 08/30/18 1434    Visit Number  5    Number of Visits  13    Date for PT Re-Evaluation  09/30/18    Authorization Type  5    Authorization Time Period  of 10 progress report (08/16/2018)    PT Start Time  1435    PT Stop Time  1515    PT Time Calculation (min)  40 min    Activity Tolerance  Patient tolerated treatment well    Behavior During Therapy  [facility] for tasks assessed/performed       Past Medical History:  Diagnosis Date  . Allergic state   . Anemia   . Complication of anesthesia   . Diverticulosis   . GERD (gastroesophageal reflux disease)   . History of hiatal hernia   . History of kidney stones   . Hypercholesterolemia   . Hypertension   . Hypothyroidism   . Osteoporosis, postmenopausal   . PONV (postoperative nausea and vomiting)    nauseated with colonoscopy  . Shingles     Past Surgical History:  Procedure Laterality Date  . BREAST BIOPSY Left 90s   benign  . COLONOSCOPY WITH PROPOFOL N/A 01/28/2016   Procedure: COLONOSCOPY WITH PROPOFOL;  Surgeon: Lollie Sails, MD;  Location: [facility] ENDOSCOPY;  Service: Endoscopy;  Laterality: N/A;  . EXCISION METACARPAL MASS Right 04/30/2017   Procedure: EXCISION MUCOID CYST FIFTH FINGER RIGHT HAND;  Surgeon: Hessie Knows, MD;  Location: [facility] ORS;  Service: Orthopedics;  Laterality: Right;  . EYE SURGERY Bilateral 2010   cataracts    There were no vitals filed for this visit.  Subjective Assessment - 08/30/18 1436    Subjective  L hip is doing alright. Can cross her legs better, still hurts. 6/10 at most for the past 7 days.     Pertinent History  L  groin/hip pain. Pain is posterior hip.  Pain has been off and on for 6 months or more. Pain has gotten worse. Gradual onset.  No back pain.   Had PT for R hip with good results.   Pain in R hip before was in the same area.   Pt was trying to strengthen her rear end before.  Pain has worsened since onset 6 months ago. Feels stiff sometimes but walking helps it feel more loose.     Patient Stated Goals  Be able to step up better. Keep her L hip from worsening.     Currently in Pain?  No/denies    Pain Score  0-No pain    Pain Onset  More than a month ago                               PT Education - 08/30/18 1443    Education provided  Yes    Education Details  ther-ex    [employer]) Educated  Patient    Methods  Explanation;Demonstration;Tactile cues;Verbal cues    Comprehension  Returned demonstration;Verbalized understanding      Objective    Added medication: bone infusion to be given 09/01/2018  Pt states blood pressure is  controlled  No latex band allergies   Osteoporosis   TTP L greater trochanter with reproduction of symptoms in sitting No TTP to L greater trochanter in prone   Therapeutic exercise   Supine L hip extension isometrics with L LE straight, R knee bent. 10x3with 10second holds   Supine bridge with hip adductor ball squeeze 10x2  S/L L hip abduction 10x3  R S/L reverse clamshells 10x3 L   Prone L hip extension 10x3  Self posterior hip capsule stretch L on table 15 seconds x 5  Running man with one UE assist. L LE 10x3  Standing L LE leg press, double blue band with B UE assist 10x3  Leg press seat 2 plate 35              Double legs 10x3  Ascending and descending 4 regular steps with reciprocal pattern with UE to no UE assist 3x. No pain. Cues for femoral control   Dead lift with 3 lbs each hand (6 lbs total) 10x2  Forward step up onto bosu with one UE assist             L LE  10x   Improved exercise technique, movement at target joints, use of target muscles after min to mod verbal, visual, tactile cues.   Response to treatment Good muscle use felt with exercises. No complain of increased L hip pain. Continues to be able to ascend and descend stairs in the clinic without pain.    Clinical impression  Continued working on L glute med and max strengthening to promote better positioning of L femoral head and improve ability to perform standing tasks such as stair negotiation without hip pain. Pt will benefit from continued skilled physical therapy services to decrease pain, improve strength and function.      PT Short Term Goals - 08/16/18 1559      PT SHORT TERM GOAL #1   Title  Patient will be independent with her HEP to decrease pain, improve strength and function.     Baseline  Pt has started her HEP (08/16/2018)    Time  3    Period  Weeks    Status  New    Target Date  09/09/18        PT Long Term Goals - 08/16/18 1600      PT LONG TERM GOAL #1   Title  Pt will have a decrease in L hip pain to 3/10 or less at worst to decrease difficulty with stair negotiation and prolonged walking.     Baseline  8/10 L hip pain at worst for the past 3 months (08/16/2018)    Time  6    Period  Weeks    Status  New    Target Date  09/30/18      PT LONG TERM GOAL #2   Title  Patient will improve B glute med and max strength to promote femoral control and decrease pain with stair negotiation.     Time  6    Period  Weeks    Status  New    Target Date  09/30/18      PT LONG TERM GOAL #3   Title  Patient will improve her hip FOTO score by 10 points as a demonstration of improved function.     Baseline  L hip FOTO 54 (08/16/2018)    Time  6    Period  Weeks    Status  New  Target Date  09/30/18            Plan - 08/30/18 1443    Clinical Impression Statement  Continued working on L glute med and max strengthening to promote better positioning of  L femoral head and improve ability to perform standing tasks such as stair negotiation without hip pain. Pt will benefit from continued skilled physical therapy services to decrease pain, improve strength and function.     Personal Factors and Comorbidities  Age;Comorbidity 1;Time since onset of injury/illness/exacerbation    Comorbidities  osteoporosis    Examination-Activity Limitations  Stairs;Other;Squat   walking, crossing L leg over R, stepping up onto something like a curb   Stability/Clinical Decision Making  Evolving/Moderate complexity   pt states pain has worsened since onset   Rehab Potential  Fair    Clinical Impairments Affecting Rehab Potential  (-) age, pain, hip weakness; (+) motivated    PT Frequency  2x / week    PT Duration  6 weeks    PT Treatment/Interventions  Gait training;Stair training;Functional mobility training;Therapeutic activities;Therapeutic exercise;Balance training;Neuromuscular re-education;Patient/family education;Manual techniques;Dry needling;Aquatic Therapy;Electrical Stimulation;Iontophoresis 4mg /ml Dexamethasone    PT Next Visit Plan  Improve hip IR, glute, trunk strength, lumbo/pelvic/femoral control, manual techniques, modalities PRN    Consulted and Agree with Plan of Care  Patient       Patient will benefit from skilled therapeutic intervention in order to improve the following deficits and impairments:  Pain, Postural dysfunction, Improper body mechanics, Difficulty walking, Decreased strength, Decreased range of motion  Visit Diagnosis: Pain in left hip  Muscle weakness (generalized)  Chronic low back pain, unspecified back pain laterality, unspecified whether sciatica present     Problem List Patient Active Problem List   Diagnosis Date Noted  . Osteoporosis 07/12/2018  . Bilateral hip pain 06/12/2018  . Aortic atherosclerosis ([place]) 10/10/2017  . Finger pain, right 07/05/2017  . Acute colitis 04/17/2016  . Hypokalemia 04/17/2016   . Headache 04/17/2016  . GIB (gastrointestinal bleeding) 04/14/2016  . History of colonic polyps 01/31/2016  . Change in bowel movement 10/21/2015  . Pain of both thumbs 01/14/2015  . Right hip pain 01/10/2015  . Health care maintenance 09/04/2014  . Abnormal mammogram 09/25/2013  . Abdominal pain 02/22/2013  . Anemia 05/01/2012  . GERD (gastroesophageal reflux disease) 05/01/2012  . Hematuria 05/01/2012  . Hypothyroidism 05/01/2012  . Hypercholesterolemia 05/01/2012  . Hypertension 05/01/2012    Joneen Boers PT, DPT   08/30/2018, 3:31 PM  [employer] [facility] [place] [facility] [address], [place], [address] Phone: [phone]   Fax:  [phone]  Name: Heena Woodbury MRN: [id number] Date of Birth: 1946/01/12

## 2018-09-01 ENCOUNTER — Ambulatory Visit: Payer: Medicare Other

## 2018-09-01 ENCOUNTER — Other Ambulatory Visit: Payer: Self-pay

## 2018-09-01 DIAGNOSIS — M545 Low back pain, unspecified: Secondary | ICD-10-CM

## 2018-09-01 DIAGNOSIS — G8929 Other chronic pain: Secondary | ICD-10-CM

## 2018-09-01 DIAGNOSIS — M25552 Pain in left hip: Secondary | ICD-10-CM

## 2018-09-01 DIAGNOSIS — M6281 Muscle weakness (generalized): Secondary | ICD-10-CM

## 2018-09-01 NOTE — Patient Instructions (Addendum)
Supine L hip extension isometrics with L LE straight, R knee bent. 10x3with 10second holds    Running man with one UE assist. L LE 10x3    Medbridge Access Code: [email or web address]   Sidelying Reverse Clamshell  10x3

## 2018-09-01 NOTE — Therapy (Signed)
[place] [facility] [address], [place], [address] Phone: [phone]   Fax:  [phone]  Physical Therapy Treatment  Patient Details  Name: Savannah Cox MRN: [id number] Date of Birth: November 16, 1945 Referring Provider (PT): Einar Pheasant, MD   Encounter Date: 09/01/2018  PT End of Session - 09/01/18 1432    Visit Number  6    Number of Visits  13    Date for PT Re-Evaluation  09/30/18    Authorization Type  6    Authorization Time Period  of 10 progress report (08/16/2018)    PT Start Time  1432    PT Stop Time  1513    PT Time Calculation (min)  41 min    Activity Tolerance  Patient tolerated treatment well    Behavior During Therapy  [facility] for tasks assessed/performed       Past Medical History:  Diagnosis Date  . Allergic state   . Anemia   . Complication of anesthesia   . Diverticulosis   . GERD (gastroesophageal reflux disease)   . History of hiatal hernia   . History of kidney stones   . Hypercholesterolemia   . Hypertension   . Hypothyroidism   . Osteoporosis, postmenopausal   . PONV (postoperative nausea and vomiting)    nauseated with colonoscopy  . Shingles     Past Surgical History:  Procedure Laterality Date  . BREAST BIOPSY Left 90s   benign  . COLONOSCOPY WITH PROPOFOL N/A 01/28/2016   Procedure: COLONOSCOPY WITH PROPOFOL;  Surgeon: Lollie Sails, MD;  Location: [facility] ENDOSCOPY;  Service: Endoscopy;  Laterality: N/A;  . EXCISION METACARPAL MASS Right 04/30/2017   Procedure: EXCISION MUCOID CYST FIFTH FINGER RIGHT HAND;  Surgeon: Hessie Knows, MD;  Location: [facility] ORS;  Service: Orthopedics;  Laterality: Right;  . EYE SURGERY Bilateral 2010   cataracts    There were no vitals filed for this visit.  Subjective Assessment - 09/01/18 1434    Subjective  L hip feels pretty good. No pain currently.     Pertinent History  L groin/hip pain. Pain is posterior hip.  Pain has been off  and on for 6 months or more. Pain has gotten worse. Gradual onset.  No back pain.   Had PT for R hip with good results.   Pain in R hip before was in the same area.   Pt was trying to strengthen her rear end before.  Pain has worsened since onset 6 months ago. Feels stiff sometimes but walking helps it feel more loose.     Patient Stated Goals  Be able to step up better. Keep her L hip from worsening.     Currently in Pain?  No/denies    Pain Score  0-No pain    Pain Onset  More than a month ago         [facility] PT Assessment - 09/01/18 1522      Observation/Other Assessments   Focus on Therapeutic Outcomes (FOTO)   L hip FOTO 60                           PT Education - 09/01/18 1450    Education provided  Yes    Education Details  ther-ex, HEP    Person(s) Educated  Patient    Methods  Explanation;Demonstration;Tactile cues;Verbal cues;Handout    Comprehension  Returned demonstration;Verbalized understanding  Objective    Added medication: bone infusion to be given 09/01/2018  Pt states blood pressure is controlled  No latex band allergies   Osteoporosis   TTP L greater trochanter with reproduction of symptoms in sitting No TTP to L greater trochanter in prone   Medbridge Access Code: [email or web address]    Therapeutic exercise   Supine L hip extension isometrics with L LE straight, R knee bent. 10x3with 10second holds  Supine bridge with hip adductor ball squeeze 10x2  R S/L reverse clamshells 30x L   S/L L hip abduction 10x3  Prone L hip extension 10x3  Self posterior hip capsule stretch L on table 30 seconds x 3  Running man with one UE assist to promote glute muscle strengthening L LE 10x3  Standing L LE leg press, double blue band with B UE assist 10x3  Forward lunges 32 ft x 2  Dead lift with 3 lbs each hand (6 lbs total) 10x2   Leg press seat 2 plate 35  Double legs  10x3    Improved exercise technique, movement at target joints, use of target muscles aftermin tomod verbal, visual, tactile cues.  Response to treatment Good muscle use felt with exercises. No complain of increased L hip pain.     Clinical impression Continued working on glute med and max strengthening as well as femoral control to help decrease L hip pain when performing standing tasks such as stair negotiation. Good muscle use felt with exercises and pt tolerated session well without complain of hip pain. Pt will benefit from continued skilled physical therapy services to decrease pain, improve strength and function.      PT Short Term Goals - 08/16/18 1559      PT SHORT TERM GOAL #1   Title  Patient will be independent with her HEP to decrease pain, improve strength and function.     Baseline  Pt has started her HEP (08/16/2018)    Time  3    Period  Weeks    Status  New    Target Date  09/09/18        PT Long Term Goals - 08/16/18 1600      PT LONG TERM GOAL #1   Title  Pt will have a decrease in L hip pain to 3/10 or less at worst to decrease difficulty with stair negotiation and prolonged walking.     Baseline  8/10 L hip pain at worst for the past 3 months (08/16/2018)    Time  6    Period  Weeks    Status  New    Target Date  09/30/18      PT LONG TERM GOAL #2   Title  Patient will improve B glute med and max strength to promote femoral control and decrease pain with stair negotiation.     Time  6    Period  Weeks    Status  New    Target Date  09/30/18      PT LONG TERM GOAL #3   Title  Patient will improve her hip FOTO score by 10 points as a demonstration of improved function.     Baseline  L hip FOTO 54 (08/16/2018)    Time  6    Period  Weeks    Status  New    Target Date  09/30/18            Plan - 09/01/18 1428    Clinical Impression Statement  Continued  working on [employer] and max strengthening as well as femoral control to help  decrease L hip pain when performing standing tasks such as stair negotiation. Good muscle use felt with exercises and pt tolerated session well without complain of hip pain. Pt will benefit from continued skilled physical therapy services to decrease pain, improve strength and function.     Personal Factors and Comorbidities  Age;Comorbidity 1;Time since onset of injury/illness/exacerbation    Comorbidities  osteoporosis    Examination-Activity Limitations  Stairs;Other;Squat   walking, crossing L leg over R, stepping up onto something like a curb   Stability/Clinical Decision Making  Evolving/Moderate complexity   pt states pain has worsened since onset   Rehab Potential  Fair    Clinical Impairments Affecting Rehab Potential  (-) age, pain, hip weakness; (+) motivated    PT Frequency  2x / week    PT Duration  6 weeks    PT Treatment/Interventions  Gait training;Stair training;Functional mobility training;Therapeutic activities;Therapeutic exercise;Balance training;Neuromuscular re-education;Patient/family education;Manual techniques;Dry needling;Aquatic Therapy;Electrical Stimulation;Iontophoresis 4mg /ml Dexamethasone    PT Next Visit Plan  Improve hip IR, glute, trunk strength, lumbo/pelvic/femoral control, manual techniques, modalities PRN    Consulted and Agree with Plan of Care  Patient       Patient will benefit from skilled therapeutic intervention in order to improve the following deficits and impairments:  Pain, Postural dysfunction, Improper body mechanics, Difficulty walking, Decreased strength, Decreased range of motion  Visit Diagnosis: Pain in left hip  Muscle weakness (generalized)  Chronic low back pain, unspecified back pain laterality, unspecified whether sciatica present     Problem List Patient Active Problem List   Diagnosis Date Noted  . Osteoporosis 07/12/2018  . Bilateral hip pain 06/12/2018  . Aortic atherosclerosis ([place]) 10/10/2017  . Finger pain, right  07/05/2017  . Acute colitis 04/17/2016  . Hypokalemia 04/17/2016  . Headache 04/17/2016  . GIB (gastrointestinal bleeding) 04/14/2016  . History of colonic polyps 01/31/2016  . Change in bowel movement 10/21/2015  . Pain of both thumbs 01/14/2015  . Right hip pain 01/10/2015  . Health care maintenance 09/04/2014  . Abnormal mammogram 09/25/2013  . Abdominal pain 02/22/2013  . Anemia 05/01/2012  . GERD (gastroesophageal reflux disease) 05/01/2012  . Hematuria 05/01/2012  . Hypothyroidism 05/01/2012  . Hypercholesterolemia 05/01/2012  . Hypertension 05/01/2012    Joneen Boers PT, DPT   09/01/2018, 3:24 PM  [place] [facility] [address], [place], [address] Phone: [phone]   Fax:  [phone]  Name: Savannah Cox MRN: [id number] Date of Birth: June 26, 1945

## 2018-09-06 ENCOUNTER — Ambulatory Visit: Payer: Medicare Other

## 2018-09-06 NOTE — Therapy (Signed)
[place] [facility] [address], [place], [address] Phone: [phone]   Fax:  [phone]  Patient Details  Name: Savannah Cox MRN: [id number] Date of Birth: May 02, 1946 Referring Provider:  No ref. provider found  Encounter Date: 09/06/2018      Called patient and informed patient about current clinic closure for a minimum of 2 weeks due to the corona virus outbreak. Also added S/L hip abduction HEP via [place] and walked her through how to access it. Reviewed the other HEP given to her previously. Pt verbalized understanding. Also informed pt of the possibility of performing tele health to help continue progress while the clinic is closed. Pt said that it all depends on how her schedule is because she is trying to spend time with her mother. Pt was informed that when the corona virus situation gets better and when the clinic re-opens, someone can get in touch with her to see if she wants to schedule more appointments. PT verbalized understanding.        Joneen Boers PT, DPT  09/06/2018, 10:13 AM  [place] [facility] [address], [place], [address] Phone: [phone]   Fax:  [phone]

## 2018-09-08 ENCOUNTER — Ambulatory Visit: Payer: Medicare Other

## 2018-09-13 ENCOUNTER — Ambulatory Visit: Payer: Medicare Other

## 2018-09-27 DIAGNOSIS — M545 Low back pain: Secondary | ICD-10-CM

## 2018-09-27 DIAGNOSIS — M25552 Pain in left hip: Secondary | ICD-10-CM

## 2018-09-27 DIAGNOSIS — M6281 Muscle weakness (generalized): Secondary | ICD-10-CM

## 2018-09-27 DIAGNOSIS — G8929 Other chronic pain: Secondary | ICD-10-CM

## 2018-09-27 NOTE — Therapy (Signed)
[facility] [address] [place], [address] Phone: [phone]   Fax:  [phone]  Physical Therapy  Discharge Summary   Patient Details  Name: Savannah Cox MRN: [id number] Date of Birth: 1946/01/13 Referring Provider (PT): Einar Pheasant, MD   Encounter Date: 09/27/2018    Past Medical History:  Diagnosis Date  . Allergic state   . Anemia   . Complication of anesthesia   . Diverticulosis   . GERD (gastroesophageal reflux disease)   . History of hiatal hernia   . History of kidney stones   . Hypercholesterolemia   . Hypertension   . Hypothyroidism   . Osteoporosis, postmenopausal   . PONV (postoperative nausea and vomiting)    nauseated with colonoscopy  . Shingles     Past Surgical History:  Procedure Laterality Date  . BREAST BIOPSY Left 90s   benign  . COLONOSCOPY WITH PROPOFOL N/A 01/28/2016   Procedure: COLONOSCOPY WITH PROPOFOL;  Surgeon: Lollie Sails, MD;  Location: [facility] ENDOSCOPY;  Service: Endoscopy;  Laterality: N/A;  . EXCISION METACARPAL MASS Right 04/30/2017   Procedure: EXCISION MUCOID CYST FIFTH FINGER RIGHT HAND;  Surgeon: Hessie Knows, MD;  Location: [facility] ORS;  Service: Orthopedics;  Laterality: Right;  . EYE SURGERY Bilateral 2010   cataracts    There were no vitals filed for this visit.                             Called pt to check up on her to see how she is doing as well as to offer Telehealth if needed. Pt states that she is doing well and has been performing her HEP. 0-1/10 pain at most for the past 7 days. No questions with her HEP and feels like she can graduate PT today. Obtained FOTO information over the phone. Pt demonstrates significant decrease in L hip pain as well as significant increase in function based on her FOTO score. Pt also demonstrates independence with her HEP. Skilled physical therapy services discharged with  patient continuing progress with her HEP.         PT Short Term Goals - 09/27/18 1302      PT SHORT TERM GOAL #1   Title  Patient will be independent with her HEP to decrease pain, improve strength and function.     Baseline  Pt has started her HEP (08/16/2018); Pt demonstrates independence with HEP (09/27/2018)    Time  3    Period  Weeks    Status  Achieved    Target Date  09/09/18        PT Long Term Goals - 09/27/18 1259      PT LONG TERM GOAL #1   Title  Pt will have a decrease in L hip pain to 3/10 or less at worst to decrease difficulty with stair negotiation and prolonged walking.     Baseline  8/10 L hip pain at worst for the past 3 months (08/16/2018); 0-1/10 at most for the past 7 days (09/27/2018)    Time  6    Period  Weeks    Status  Achieved    Target Date  09/30/18      PT LONG TERM GOAL #2   Title  Patient will improve B glute med and max strength to promote femoral control and decrease pain with stair negotiation.     Time  6  Period  Weeks    Status  Unable to assess   due to COVID-19 limitations to clinic visit   Target Date  09/30/18      PT LONG TERM GOAL #3   Title  Patient will improve her hip FOTO score by 10 points as a demonstration of improved function.     Baseline  L hip FOTO 54 (08/16/2018); 76 (09/27/2018)    Time  6    Period  Weeks    Status  Achieved    Target Date  09/30/18            Plan - 09/27/18 1301    Clinical Impression Statement  Called pt to check up on her to see how she is doing as well as to offer Telehealth if needed. Pt states that she is doing well and has been performing her HEP. 0-1/10 pain at most for the past 7 days. No questions with her HEP and feels like she can graduate PT today. Obtained FOTO information over the phone. Pt demonstrates significant decrease in L hip pain as well as significant increase in function based on her FOTO score. Pt also demonstrates independence with her HEP. Skilled physical therapy  services discharged with patient continuing progress with her HEP.     Personal Factors and Comorbidities  Age;Comorbidity 1;Time since onset of injury/illness/exacerbation    Comorbidities  osteoporosis    Examination-Activity Limitations  --   walking, crossing L leg over R, stepping up onto something like a curb   Stability/Clinical Decision Making  Stable/Uncomplicated   pt states pain has worsened since onset   Clinical Decision Making  Low    PT Treatment/Interventions  Gait training;Stair training;Functional mobility training;Therapeutic activities;Therapeutic exercise;Balance training;Neuromuscular re-education;Patient/family education;Manual techniques    PT Next Visit Plan  Continue progress with HEP    Consulted and Agree with Plan of Care  Patient       Patient will benefit from skilled therapeutic intervention in order to improve the following deficits and impairments:  Decreased strength  Visit Diagnosis: Pain in left hip  Muscle weakness (generalized)  Chronic low back pain, unspecified back pain laterality, unspecified whether sciatica present     Problem List Patient Active Problem List   Diagnosis Date Noted  . Osteoporosis 07/12/2018  . Bilateral hip pain 06/12/2018  . Aortic atherosclerosis ([place]) 10/10/2017  . Finger pain, right 07/05/2017  . Acute colitis 04/17/2016  . Hypokalemia 04/17/2016  . Headache 04/17/2016  . GIB (gastrointestinal bleeding) 04/14/2016  . History of colonic polyps 01/31/2016  . Change in bowel movement 10/21/2015  . Pain of both thumbs 01/14/2015  . Right hip pain 01/10/2015  . Health care maintenance 09/04/2014  . Abnormal mammogram 09/25/2013  . Abdominal pain 02/22/2013  . Anemia 05/01/2012  . GERD (gastroesophageal reflux disease) 05/01/2012  . Hematuria 05/01/2012  . Hypothyroidism 05/01/2012  . Hypercholesterolemia 05/01/2012  . Hypertension 05/01/2012    Joneen Boers PT, DPT   09/27/2018, 1:03 PM  [facility] [address], [place], [address] Phone: [phone]   Fax:  [phone]  Name: Savannah Cox MRN: [id number] Date of Birth: [birth year]

## 2018-10-23 ENCOUNTER — Other Ambulatory Visit: Payer: Self-pay | Admitting: Internal Medicine

## 2018-11-04 ENCOUNTER — Encounter: Payer: Self-pay | Admitting: Internal Medicine

## 2018-11-04 ENCOUNTER — Ambulatory Visit (INDEPENDENT_AMBULATORY_CARE_PROVIDER_SITE_OTHER): Payer: Medicare Other | Admitting: Internal Medicine

## 2018-11-04 ENCOUNTER — Other Ambulatory Visit: Payer: Self-pay

## 2018-11-04 DIAGNOSIS — D649 Anemia, unspecified: Secondary | ICD-10-CM | POA: Diagnosis not present

## 2018-11-04 DIAGNOSIS — I700 Atherosclerosis of aorta: Secondary | ICD-10-CM | POA: Diagnosis not present

## 2018-11-04 DIAGNOSIS — I10 Essential (primary) hypertension: Secondary | ICD-10-CM

## 2018-11-04 DIAGNOSIS — M810 Age-related osteoporosis without current pathological fracture: Secondary | ICD-10-CM

## 2018-11-04 DIAGNOSIS — E039 Hypothyroidism, unspecified: Secondary | ICD-10-CM

## 2018-11-04 DIAGNOSIS — K219 Gastro-esophageal reflux disease without esophagitis: Secondary | ICD-10-CM | POA: Diagnosis not present

## 2018-11-04 DIAGNOSIS — E7800 Pure hypercholesterolemia, unspecified: Secondary | ICD-10-CM | POA: Diagnosis not present

## 2018-11-04 NOTE — Progress Notes (Signed)
Patient ID: Savannah Cox, female   DOB: 1945/10/19, 73 y.o.   MRN: [id number]   Virtual Visit via video Note  This visit type was conducted due to national recommendations for restrictions regarding the COVID-19 pandemic (e.g. social distancing).  This format is felt to be most appropriate for this patient at this time.  All issues noted in this document were discussed and addressed.  No physical exam was performed (except for noted visual exam findings with Video Visits).   I connected with Patrick Jupiter by a video enabled telemedicine application and verified that I am speaking with the correct person using two identifiers. Location patient: home Location provider: work Persons participating in the virtual visit: patient, provider  I discussed the limitations, risks, security and privacy concerns of performing an evaluation and management service by video and the availability of in person appointments. The patient expressed understanding and agreed to proceed.   Reason for visit: scheduled follow up.    HPI: She reports she is doing well.  Feels good.  Was going to PT for left hip pain.  Doing her exercises at home.  Trying to stay active.  No chest pain.  No sob.  No acid reflux.  No abdominal pain.  Bowels moving.  No urine change.  Due mammogram.  Need to schedule.  On crestor.  Tolerating.  Walking 2 miles per day.  Scheduled to receive reclast 11/2018.     ROS: See pertinent positives and negatives per HPI.  Past Medical History:  Diagnosis Date  . Allergic state   . Anemia   . Complication of anesthesia   . Diverticulosis   . GERD (gastroesophageal reflux disease)   . History of hiatal hernia   . History of kidney stones   . Hypercholesterolemia   . Hypertension   . Hypothyroidism   . Osteoporosis, postmenopausal   . PONV (postoperative nausea and vomiting)    nauseated with colonoscopy  . Shingles     Past Surgical History:  Procedure Laterality Date  . BREAST  BIOPSY Left 90s   benign  . COLONOSCOPY WITH PROPOFOL N/A 01/28/2016   Procedure: COLONOSCOPY WITH PROPOFOL;  Surgeon: Lollie Sails, MD;  Location: [facility] ENDOSCOPY;  Service: Endoscopy;  Laterality: N/A;  . EXCISION METACARPAL MASS Right 04/30/2017   Procedure: EXCISION MUCOID CYST FIFTH FINGER RIGHT HAND;  Surgeon: Hessie Knows, MD;  Location: [facility] ORS;  Service: Orthopedics;  Laterality: Right;  . EYE SURGERY Bilateral 2010   cataracts    Family History  Problem Relation Age of Onset  . Heart disease Father        died age 17 - myocardial infarction  . Hypercholesterolemia Mother   . Arthritis Mother   . Kidney cancer Other        aunt  . Breast cancer Maternal Aunt   . Colon cancer Neg Hx     SOCIAL HX: reviewed.    Current Outpatient Medications:  .  levothyroxine (SYNTHROID, LEVOTHROID) 75 MCG tablet, Take 1 tablet (75 mcg total) by mouth daily., Disp: 90 tablet, Rfl: 3 .  lisinopril-hydrochlorothiazide (ZESTORETIC) 10-12.5 MG tablet, TAKE 1 TABLET BY MOUTH EVERY DAY, Disp: 90 tablet, Rfl: 1 .  loratadine (CLARITIN) 10 MG tablet, Take 10 mg by mouth daily. , Disp: , Rfl:  .  Multiple Vitamins-Minerals (CENTRUM SILVER ULTRA WOMENS PO), Take 1 tablet by mouth daily. , Disp: , Rfl:  .  omeprazole (PRILOSEC) 40 MG capsule, TAKE 1 CAPSULE (40 MG TOTAL) BY  MOUTH ONCE DAILY TAKE 30 MINS BEFORE A MEAL, Disp: , Rfl: 1 .  rosuvastatin (CRESTOR) 5 MG tablet, TAKE 1 TABLET BY MOUTH EVERY DAY, Disp: 90 tablet, Rfl: 1 .  sertraline (ZOLOFT) 50 MG tablet, TAKE 1 TABLET BY MOUTH EVERY DAY, Disp: 90 tablet, Rfl: 3  EXAM:  GENERAL: alert, oriented, appears well and in no acute distress  HEENT: atraumatic, conjunttiva clear, no obvious abnormalities on inspection of external nose and ears  NECK: normal movements of the head and neck  LUNGS: on inspection no signs of respiratory distress, breathing rate appears normal, no obvious gross SOB, gasping or wheezing  CV: no obvious  cyanosis  PSYCH/NEURO: pleasant and cooperative, no obvious depression or anxiety, speech and thought processing grossly intact  ASSESSMENT AND PLAN:  Discussed the following assessment and plan:  Anemia, unspecified type - Plan: CBC with Differential/Platelet  Aortic atherosclerosis (HCC)  Gastroesophageal reflux disease, esophagitis presence not specified  Hypercholesterolemia - Plan: Hepatic function panel, Lipid panel  Essential hypertension - Plan: Basic metabolic panel  Hypothyroidism, unspecified type - Plan: TSH  Osteoporosis without current pathological fracture, unspecified osteoporosis type  Anemia Follow cbc.   Aortic atherosclerosis ([place]) On crestor.   GERD (gastroesophageal reflux disease) Controlled on current regimen.    Hypercholesterolemia On crestor.  Taking regularly.  Follow lipid panel and liver function tests.    Hypertension Blood pressure under good control.  Continue same medication regimen.  Follow pressures.  Follow metabolic panel.    Hypothyroidism On thyroid replacement.  Follow tsh.    Osteoporosis Due to receive reclast 11/2018.      I discussed the assessment and treatment plan with the patient. The patient was provided an opportunity to ask questions and all were answered. The patient agreed with the plan and demonstrated an understanding of the instructions.   The patient was advised to call back or seek an in-person evaluation if the symptoms worsen or if the condition fails to improve as anticipated.    Einar Pheasant, MD

## 2018-11-07 ENCOUNTER — Encounter: Payer: Self-pay | Admitting: Internal Medicine

## 2018-11-07 NOTE — Assessment & Plan Note (Signed)
Blood pressure under good control.  Continue same medication regimen.  Follow pressures.  Follow metabolic panel.   

## 2018-11-07 NOTE — Assessment & Plan Note (Signed)
Follow cbc.  

## 2018-11-07 NOTE — Assessment & Plan Note (Signed)
Controlled on current regimen.   

## 2018-11-07 NOTE — Assessment & Plan Note (Signed)
On crestor.  Taking regularly.  Follow lipid panel and liver function tests.

## 2018-11-07 NOTE — Assessment & Plan Note (Signed)
Due to receive reclast 11/2018.

## 2018-11-07 NOTE — Assessment & Plan Note (Signed)
On thyroid replacement.  Follow tsh.  

## 2018-11-07 NOTE — Assessment & Plan Note (Signed)
On crestor.   

## 2018-11-13 ENCOUNTER — Encounter: Payer: Self-pay | Admitting: Internal Medicine

## 2018-11-15 ENCOUNTER — Other Ambulatory Visit: Payer: Self-pay

## 2018-11-15 DIAGNOSIS — Z1231 Encounter for screening mammogram for malignant neoplasm of breast: Secondary | ICD-10-CM

## 2018-12-01 ENCOUNTER — Other Ambulatory Visit (INDEPENDENT_AMBULATORY_CARE_PROVIDER_SITE_OTHER): Payer: Medicare Other

## 2018-12-01 ENCOUNTER — Other Ambulatory Visit: Payer: Self-pay

## 2018-12-01 DIAGNOSIS — I10 Essential (primary) hypertension: Secondary | ICD-10-CM

## 2018-12-01 DIAGNOSIS — E7800 Pure hypercholesterolemia, unspecified: Secondary | ICD-10-CM

## 2018-12-01 DIAGNOSIS — E039 Hypothyroidism, unspecified: Secondary | ICD-10-CM | POA: Diagnosis not present

## 2018-12-01 DIAGNOSIS — D649 Anemia, unspecified: Secondary | ICD-10-CM | POA: Diagnosis not present

## 2018-12-01 LAB — CBC WITH DIFFERENTIAL/PLATELET
Basophils Absolute: 0.1 10*3/uL (ref 0.0–0.1)
Basophils Relative: 2.1 % (ref 0.0–3.0)
Eosinophils Absolute: 0.1 10*3/uL (ref 0.0–0.7)
Eosinophils Relative: 2.6 % (ref 0.0–5.0)
HCT: 38.5 % (ref 36.0–46.0)
Hemoglobin: 12.9 g/dL (ref 12.0–15.0)
Lymphocytes Relative: 38.2 % (ref 12.0–46.0)
Lymphs Abs: 1.5 10*3/uL (ref 0.7–4.0)
MCHC: 33.6 g/dL (ref 30.0–36.0)
MCV: 97.9 fl (ref 78.0–100.0)
Monocytes Absolute: 0.6 10*3/uL (ref 0.1–1.0)
Monocytes Relative: 14.3 % — ABNORMAL HIGH (ref 3.0–12.0)
Neutro Abs: 1.7 10*3/uL (ref 1.4–7.7)
Neutrophils Relative %: 42.8 % — ABNORMAL LOW (ref 43.0–77.0)
Platelets: 228 10*3/uL (ref 150.0–400.0)
RBC: 3.93 Mil/uL (ref 3.87–5.11)
RDW: 13.8 % (ref 11.5–15.5)
WBC: 3.9 10*3/uL — ABNORMAL LOW (ref 4.0–10.5)

## 2018-12-01 LAB — LIPID PANEL
Cholesterol: 177 mg/dL (ref 0–200)
HDL: 64.3 mg/dL (ref >39.00)
LDL Cholesterol: 87 mg/dL (ref 0–99)
NonHDL: 112.95
Total CHOL/HDL Ratio: 3
Triglycerides: 129 mg/dL (ref 0.0–149.0)
VLDL: 25.8 mg/dL (ref 0.0–40.0)

## 2018-12-01 LAB — HEPATIC FUNCTION PANEL
ALT: 34 U/L (ref 0–35)
AST: 41 U/L — ABNORMAL HIGH (ref 0–37)
Albumin: 4 g/dL (ref 3.5–5.2)
Alkaline Phosphatase: 59 U/L (ref 39–117)
Bilirubin, Direct: 0.1 mg/dL (ref 0.0–0.3)
Total Bilirubin: 0.3 mg/dL (ref 0.2–1.2)
Total Protein: 6.1 g/dL (ref 6.0–8.3)

## 2018-12-01 LAB — BASIC METABOLIC PANEL
BUN: 15 mg/dL (ref 6–23)
CO2: 29 mEq/L (ref 19–32)
Calcium: 9.1 mg/dL (ref 8.4–10.5)
Chloride: 105 mEq/L (ref 96–112)
Creatinine, Ser: 0.72 mg/dL (ref 0.40–1.20)
GFR: 79.85 mL/min (ref >60.00)
Glucose, Bld: 90 mg/dL (ref 70–99)
Potassium: 3.9 mEq/L (ref 3.5–5.1)
Sodium: 140 mEq/L (ref 135–145)

## 2018-12-01 LAB — TSH: TSH: 0.36 u[IU]/mL (ref 0.35–4.50)

## 2018-12-02 ENCOUNTER — Encounter: Payer: Self-pay | Admitting: Internal Medicine

## 2018-12-02 ENCOUNTER — Other Ambulatory Visit: Payer: Self-pay | Admitting: Internal Medicine

## 2018-12-02 DIAGNOSIS — R945 Abnormal results of liver function studies: Secondary | ICD-10-CM

## 2018-12-02 DIAGNOSIS — R7989 Other specified abnormal findings of blood chemistry: Secondary | ICD-10-CM

## 2018-12-02 DIAGNOSIS — D72819 Decreased white blood cell count, unspecified: Secondary | ICD-10-CM

## 2018-12-02 NOTE — Progress Notes (Signed)
Order placed for f/u labs.  

## 2018-12-13 ENCOUNTER — Other Ambulatory Visit: Payer: Self-pay | Admitting: Internal Medicine

## 2018-12-22 ENCOUNTER — Encounter: Payer: Self-pay | Admitting: Internal Medicine

## 2018-12-22 ENCOUNTER — Other Ambulatory Visit (INDEPENDENT_AMBULATORY_CARE_PROVIDER_SITE_OTHER): Payer: Medicare Other

## 2018-12-22 ENCOUNTER — Other Ambulatory Visit: Payer: Self-pay

## 2018-12-22 DIAGNOSIS — R945 Abnormal results of liver function studies: Secondary | ICD-10-CM

## 2018-12-22 DIAGNOSIS — D72819 Decreased white blood cell count, unspecified: Secondary | ICD-10-CM

## 2018-12-22 DIAGNOSIS — R7989 Other specified abnormal findings of blood chemistry: Secondary | ICD-10-CM

## 2018-12-22 LAB — CBC WITH DIFFERENTIAL/PLATELET
Basophils Absolute: 0.1 10*3/uL (ref 0.0–0.1)
Basophils Relative: 1.5 % (ref 0.0–3.0)
Eosinophils Absolute: 0.1 10*3/uL (ref 0.0–0.7)
Eosinophils Relative: 2.7 % (ref 0.0–5.0)
HCT: 40.7 % (ref 36.0–46.0)
Hemoglobin: 13.5 g/dL (ref 12.0–15.0)
Lymphocytes Relative: 28.9 % (ref 12.0–46.0)
Lymphs Abs: 1.2 10*3/uL (ref 0.7–4.0)
MCHC: 33.3 g/dL (ref 30.0–36.0)
MCV: 98.6 fl (ref 78.0–100.0)
Monocytes Absolute: 0.6 10*3/uL (ref 0.1–1.0)
Monocytes Relative: 14.4 % — ABNORMAL HIGH (ref 3.0–12.0)
Neutro Abs: 2.2 10*3/uL (ref 1.4–7.7)
Neutrophils Relative %: 52.5 % (ref 43.0–77.0)
Platelets: 240 10*3/uL (ref 150.0–400.0)
RBC: 4.13 Mil/uL (ref 3.87–5.11)
RDW: 13.4 % (ref 11.5–15.5)
WBC: 4.1 10*3/uL (ref 4.0–10.5)

## 2018-12-22 LAB — HEPATIC FUNCTION PANEL
ALT: 22 U/L (ref 0–35)
AST: 30 U/L (ref 0–37)
Albumin: 4.3 g/dL (ref 3.5–5.2)
Alkaline Phosphatase: 56 U/L (ref 39–117)
Bilirubin, Direct: 0.1 mg/dL (ref 0.0–0.3)
Total Bilirubin: 0.5 mg/dL (ref 0.2–1.2)
Total Protein: 6.7 g/dL (ref 6.0–8.3)

## 2019-02-01 ENCOUNTER — Other Ambulatory Visit: Payer: Self-pay | Admitting: Internal Medicine

## 2019-02-28 ENCOUNTER — Other Ambulatory Visit: Payer: Self-pay

## 2019-02-28 DIAGNOSIS — Z20822 Contact with and (suspected) exposure to covid-19: Secondary | ICD-10-CM

## 2019-03-01 LAB — NOVEL CORONAVIRUS, NAA: SARS-CoV-2, NAA: NOT DETECTED

## 2019-03-14 ENCOUNTER — Other Ambulatory Visit: Payer: Self-pay

## 2019-03-14 DIAGNOSIS — Z20822 Contact with and (suspected) exposure to covid-19: Secondary | ICD-10-CM

## 2019-03-15 LAB — SPECIMEN STATUS REPORT

## 2019-03-15 LAB — NOVEL CORONAVIRUS, NAA: SARS-CoV-2, NAA: NOT DETECTED

## 2019-03-18 ENCOUNTER — Ambulatory Visit (INDEPENDENT_AMBULATORY_CARE_PROVIDER_SITE_OTHER): Payer: Medicare Other | Admitting: Internal Medicine

## 2019-03-18 ENCOUNTER — Encounter: Payer: Self-pay | Admitting: Internal Medicine

## 2019-03-18 VITALS — Ht 63.0 in | Wt 131.0 lb

## 2019-03-18 DIAGNOSIS — K219 Gastro-esophageal reflux disease without esophagitis: Secondary | ICD-10-CM

## 2019-03-18 DIAGNOSIS — M810 Age-related osteoporosis without current pathological fracture: Secondary | ICD-10-CM

## 2019-03-18 DIAGNOSIS — E7800 Pure hypercholesterolemia, unspecified: Secondary | ICD-10-CM

## 2019-03-18 DIAGNOSIS — I700 Atherosclerosis of aorta: Secondary | ICD-10-CM

## 2019-03-18 DIAGNOSIS — Z1231 Encounter for screening mammogram for malignant neoplasm of breast: Secondary | ICD-10-CM

## 2019-03-18 DIAGNOSIS — D649 Anemia, unspecified: Secondary | ICD-10-CM

## 2019-03-18 DIAGNOSIS — E039 Hypothyroidism, unspecified: Secondary | ICD-10-CM

## 2019-03-18 DIAGNOSIS — I10 Essential (primary) hypertension: Secondary | ICD-10-CM

## 2019-03-18 DIAGNOSIS — R0789 Other chest pain: Secondary | ICD-10-CM

## 2019-03-18 NOTE — Progress Notes (Addendum)
Patient ID: Savannah Cox, female   DOB: 12-Mar-1946, 73 y.o.   MRN: [id number]   Virtual Visit via video Note  This visit type was conducted due to national recommendations for restrictions regarding the COVID-19 pandemic (e.g. social distancing).  This format is felt to be most appropriate for this patient at this time.  All issues noted in this document were discussed and addressed.  No physical exam was performed (except for noted visual exam findings with Video Visits).   I connected with Savannah Cox by a video enabled telemedicine application and verified that I am speaking with the correct person using two identifiers. Location patient: home Location provider: work  Persons participating in the virtual visit: patient, provider  I discussed the limitations, risks, security and privacy concerns of performing an evaluation and management service by video and the availability of in person appointments.  The patient expressed understanding and agreed to proceed.   Reason for visit: scheduled followup.   HPI: Was having hip pain.  Physical therapy.  Doing exercises at home.  Tries to stay active.  Does report chest tightness.  Had episode a couple of weeks ago. Lasted approximately 30 minutes.   No sob.  Eating.  No acid reflux.  No abdominal pain.  Bowels moving.  Taking crestor.  Received reclast 11/2018.  Blood pressure has been doing well.  Previous eye infection - 12/2018.  Clear now.  Reports noticing chest tightness two weeks ago.  Lasted approximately 30 minutes.  Discussed further w/up.  She is agreeable to see cardiology.  She request Dr Ubaldo Glassing.     ROS: See pertinent positives and negatives per HPI.  Past Medical History:  Diagnosis Date  . Allergic state   . Anemia   . Complication of anesthesia   . Diverticulosis   . GERD (gastroesophageal reflux disease)   . History of hiatal hernia   . History of kidney stones   . Hypercholesterolemia   . Hypertension   . Hypothyroidism    . Osteoporosis, postmenopausal   . PONV (postoperative nausea and vomiting)    nauseated with colonoscopy  . Shingles     Past Surgical History:  Procedure Laterality Date  . BREAST BIOPSY Left 90s   benign  . COLONOSCOPY WITH PROPOFOL N/A 01/28/2016   Procedure: COLONOSCOPY WITH PROPOFOL;  Surgeon: Lollie Sails, MD;  Location: [facility] ENDOSCOPY;  Service: Endoscopy;  Laterality: N/A;  . EXCISION METACARPAL MASS Right 04/30/2017   Procedure: EXCISION MUCOID CYST FIFTH FINGER RIGHT HAND;  Surgeon: Hessie Knows, MD;  Location: [facility] ORS;  Service: Orthopedics;  Laterality: Right;  . EYE SURGERY Bilateral 2010   cataracts    Family History  Problem Relation Age of Onset  . Heart disease Father        died age 12 - myocardial infarction  . Hypercholesterolemia Mother   . Arthritis Mother   . Kidney cancer Other        aunt  . Breast cancer Maternal Aunt   . Colon cancer Neg Hx     SOCIAL HX: reviewed.    Current Outpatient Medications:  .  levothyroxine (SYNTHROID) 75 MCG tablet, TAKE 1 TABLET BY MOUTH EVERY DAY, Disp: 90 tablet, Rfl: 0 .  lisinopril-hydrochlorothiazide (ZESTORETIC) 10-12.5 MG tablet, TAKE 1 TABLET BY MOUTH EVERY DAY, Disp: 90 tablet, Rfl: 1 .  loratadine (CLARITIN) 10 MG tablet, Take 10 mg by mouth daily. , Disp: , Rfl:  .  Multiple Vitamins-Minerals (CENTRUM SILVER ULTRA WOMENS PO), Take  1 tablet by mouth daily. , Disp: , Rfl:  .  omeprazole (PRILOSEC) 40 MG capsule, TAKE 1 CAPSULE (40 MG TOTAL) BY MOUTH ONCE DAILY TAKE 30 MINS BEFORE A MEAL, Disp: , Rfl: 1 .  rosuvastatin (CRESTOR) 5 MG tablet, TAKE 1 TABLET BY MOUTH EVERY DAY, Disp: 90 tablet, Rfl: 1 .  sertraline (ZOLOFT) 50 MG tablet, TAKE 1 TABLET BY MOUTH EVERY DAY, Disp: 90 tablet, Rfl: 3  EXAM:  GENERAL: alert, oriented, appears well and in no acute distress  HEENT: atraumatic, conjunttiva clear, no obvious abnormalities on inspection of external nose and ears  NECK: normal movements of the  head and neck  LUNGS: on inspection no signs of respiratory distress, breathing rate appears normal, no obvious gross SOB, gasping or wheezing  CV: no obvious cyanosis  PSYCH/NEURO: pleasant and cooperative, no obvious depression or anxiety, speech and thought processing grossly intact  ASSESSMENT AND PLAN:  Discussed the following assessment and plan:  Anemia Follow cbc.    Aortic atherosclerosis ([place]) On crestor.    GERD (gastroesophageal reflux disease) Controlled.   Hypercholesterolemia Low cholesterol diet and exercise.  On crestor.  Follow lipid panel and liver functoin tests.    Hypertension Blood pressure has been under good control.  Continue current medication regimen.  Follow pressures.  Follow metabolic panel.    Hypothyroidism On thyroid replacement.  Follow tsh.    Osteoporosis reclast 11/2018.    Chest tightness Chest tightness as outlined.  Discussed limitations given doxy visit.  Discussed further cardiac w/up.  She was in agreement.  Request to see Dr Ubaldo Glassing. Discussed with her.  If any recurring symptoms, needs to be evaluated.       I discussed the assessment and treatment plan with the patient. The patient was provided an opportunity to ask questions and all were answered. The patient agreed with the plan and demonstrated an understanding of the instructions.   The patient was advised to call back or seek an in-person evaluation if the symptoms worsen or if the condition fails to improve as anticipated.   Einar Pheasant, MD

## 2019-03-20 ENCOUNTER — Encounter: Payer: Self-pay | Admitting: Internal Medicine

## 2019-03-20 DIAGNOSIS — R0789 Other chest pain: Secondary | ICD-10-CM | POA: Insufficient documentation

## 2019-03-20 NOTE — Assessment & Plan Note (Addendum)
Low cholesterol diet and exercise.  On crestor.  Follow lipid panel and liver functoin tests.

## 2019-03-20 NOTE — Assessment & Plan Note (Signed)
reclast 11/2018. 

## 2019-03-20 NOTE — Assessment & Plan Note (Signed)
On thyroid replacement.  Follow tsh.  

## 2019-03-20 NOTE — Addendum Note (Signed)
Addended by: Alisa Graff on: 03/20/2019 10:40 PM   Modules accepted: Orders

## 2019-03-20 NOTE — Assessment & Plan Note (Signed)
Follow cbc.  

## 2019-03-20 NOTE — Assessment & Plan Note (Signed)
On crestor.   

## 2019-03-20 NOTE — Assessment & Plan Note (Signed)
Blood pressure has been under good control.  Continue current medication regimen.  Follow pressures.  Follow metabolic panel.  

## 2019-03-20 NOTE — Assessment & Plan Note (Signed)
Controlled.  

## 2019-03-20 NOTE — Assessment & Plan Note (Addendum)
Chest tightness as outlined.  Discussed limitations given doxy visit.  Discussed further cardiac w/up.  She was in agreement.  Request to see Dr Ubaldo Glassing. Discussed with her.  If any recurring symptoms, needs to be evaluated.

## 2019-03-29 ENCOUNTER — Ambulatory Visit: Payer: Medicare Other | Admitting: Internal Medicine

## 2019-03-29 ENCOUNTER — Ambulatory Visit: Payer: Medicare Other

## 2019-03-30 ENCOUNTER — Other Ambulatory Visit: Payer: Self-pay | Admitting: Internal Medicine

## 2019-04-25 ENCOUNTER — Other Ambulatory Visit: Payer: Self-pay | Admitting: Internal Medicine

## 2019-04-26 ENCOUNTER — Other Ambulatory Visit: Payer: Self-pay | Admitting: Internal Medicine

## 2019-06-02 ENCOUNTER — Other Ambulatory Visit: Payer: Self-pay | Admitting: Internal Medicine

## 2019-06-06 ENCOUNTER — Encounter: Payer: Self-pay | Admitting: Internal Medicine

## 2019-06-06 ENCOUNTER — Ambulatory Visit: Payer: Medicare Other

## 2019-06-06 ENCOUNTER — Other Ambulatory Visit: Payer: Self-pay

## 2019-06-06 ENCOUNTER — Ambulatory Visit (INDEPENDENT_AMBULATORY_CARE_PROVIDER_SITE_OTHER): Payer: Medicare Other | Admitting: Internal Medicine

## 2019-06-06 DIAGNOSIS — M810 Age-related osteoporosis without current pathological fracture: Secondary | ICD-10-CM

## 2019-06-06 DIAGNOSIS — R198 Other specified symptoms and signs involving the digestive system and abdomen: Secondary | ICD-10-CM

## 2019-06-06 DIAGNOSIS — E039 Hypothyroidism, unspecified: Secondary | ICD-10-CM

## 2019-06-06 DIAGNOSIS — K219 Gastro-esophageal reflux disease without esophagitis: Secondary | ICD-10-CM

## 2019-06-06 DIAGNOSIS — I700 Atherosclerosis of aorta: Secondary | ICD-10-CM

## 2019-06-06 DIAGNOSIS — R0602 Shortness of breath: Secondary | ICD-10-CM | POA: Insufficient documentation

## 2019-06-06 DIAGNOSIS — R109 Unspecified abdominal pain: Secondary | ICD-10-CM | POA: Diagnosis not present

## 2019-06-06 DIAGNOSIS — E7800 Pure hypercholesterolemia, unspecified: Secondary | ICD-10-CM

## 2019-06-06 DIAGNOSIS — Z86010 Personal history of colonic polyps: Secondary | ICD-10-CM

## 2019-06-06 DIAGNOSIS — D649 Anemia, unspecified: Secondary | ICD-10-CM

## 2019-06-06 DIAGNOSIS — I10 Essential (primary) hypertension: Secondary | ICD-10-CM

## 2019-06-06 MED ORDER — BETAMETHASONE DIPROPIONATE 0.05 % EX CREA: TOPICAL_CREAM | Freq: Two times a day (BID) | CUTANEOUS | 0 refills | Status: DC | PRN

## 2019-06-06 MED ORDER — OMEPRAZOLE 40 MG PO CPDR: DELAYED_RELEASE_CAPSULE | ORAL | 0 refills | Status: DC

## 2019-06-06 NOTE — Progress Notes (Signed)
Patient ID: Savannah Cox, female   DOB: 1946-04-25, 73 y.o.   MRN: [id number]   Virtual Visit via video Note  This visit type was conducted due to national recommendations for restrictions regarding the COVID-19 pandemic (e.g. social distancing).  This format is felt to be most appropriate for this patient at this time.  All issues noted in this document were discussed and addressed.  No physical exam was performed (except for noted visual exam findings with Video Visits).   I connected with Savannah Cox by a video enabled telemedicine application and verified that I am speaking with the correct person using two identifiers. Location patient: home Location provider: work Persons participating in the virtual visit: patient, provider  I discussed the limitations, risks, security and privacy concerns of performing an evaluation and management service by video and the availability of in person appointments. The patient expressed understanding and agreed to proceed.   Reason for visit: scheduled follow up.   HPI: Saw Dr Sandra Cockayne regarding her eyes - itching.  Was given prednisone drops.  Using.  Improved.  Request refill cream - to use on her hands.  Recently evaluated by GI for irregular bowel habits - alternating loose stools, formed stools and constipation with some associated LLQ pain.  Has a history of colitis and diverticulitis.  Treated with cipro/flagyl.  Planning for colonoscopy.  Finished cipro last night.  Still some loose bowel now.  Also has noticed some increased acid.  Discussed taking omeprazole. Saw cardiology 04/2019.  Stable.  Started on aspirin 81mg  q day.  Recommended f/u in 6 months.  No chest pain reported.  Still with sob.  Previous smoker.  Discussed further w/up - since cardiac w/up unrevealing.  No vomiting reported.      ROS: See pertinent positives and negatives per HPI.  Past Medical History:  Diagnosis Date  . Allergic state   . Anemia   . Complication of  anesthesia   . Diverticulosis   . GERD (gastroesophageal reflux disease)   . History of hiatal hernia   . History of kidney stones   . Hypercholesterolemia   . Hypertension   . Hypothyroidism   . Osteoporosis, postmenopausal   . PONV (postoperative nausea and vomiting)    nauseated with colonoscopy  . Shingles     Past Surgical History:  Procedure Laterality Date  . BREAST BIOPSY Left 90s   benign  . COLONOSCOPY WITH PROPOFOL N/A 01/28/2016   Procedure: COLONOSCOPY WITH PROPOFOL;  Surgeon: Lollie Sails, MD;  Location: [facility] ENDOSCOPY;  Service: Endoscopy;  Laterality: N/A;  . EXCISION METACARPAL MASS Right 04/30/2017   Procedure: EXCISION MUCOID CYST FIFTH FINGER RIGHT HAND;  Surgeon: Hessie Knows, MD;  Location: [facility] ORS;  Service: Orthopedics;  Laterality: Right;  . EYE SURGERY Bilateral 2010   cataracts    Family History  Problem Relation Age of Onset  . Heart disease Father        died age 79 - myocardial infarction  . Hypercholesterolemia Mother   . Arthritis Mother   . Kidney cancer Other        aunt  . Breast cancer Maternal Aunt   . Colon cancer Neg Hx     SOCIAL HX: reviewed.    Current Outpatient Medications:  .  betamethasone dipropionate 0.05 % cream, Apply topically 2 (two) times daily as needed., Disp: 30 g, Rfl: 0 .  levothyroxine (SYNTHROID) 75 MCG tablet, TAKE 1 TABLET BY MOUTH EVERY DAY, Disp: 90 tablet, Rfl: 0 .  lisinopril-hydrochlorothiazide (ZESTORETIC) 10-12.5 MG tablet, TAKE 1 TABLET BY MOUTH EVERY DAY, Disp: 90 tablet, Rfl: 1 .  loratadine (CLARITIN) 10 MG tablet, Take 10 mg by mouth daily. , Disp: , Rfl:  .  Multiple Vitamins-Minerals (CENTRUM SILVER ULTRA WOMENS PO), Take 1 tablet by mouth daily. , Disp: , Rfl:  .  omeprazole (PRILOSEC) 40 MG capsule, TAKE 1 CAPSULE (40 MG TOTAL) BY MOUTH ONCE DAILY TAKE 30 MINS BEFORE A MEAL, Disp: 90 capsule, Rfl: 0 .  rosuvastatin (CRESTOR) 5 MG tablet, TAKE 1 TABLET BY MOUTH EVERY DAY, Disp: 90  tablet, Rfl: 1 .  sertraline (ZOLOFT) 50 MG tablet, TAKE 1 TABLET BY MOUTH EVERY DAY, Disp: 90 tablet, Rfl: 3  EXAM:  GENERAL: alert, oriented, appears well and in no acute distress  HEENT: atraumatic, conjunttiva clear, no obvious abnormalities on inspection of external nose and ears  NECK: normal movements of the head and neck  LUNGS: on inspection no signs of respiratory distress, breathing rate appears normal, no obvious gross SOB, gasping or wheezing  CV: no obvious cyanosis  PSYCH/NEURO: pleasant and cooperative, no obvious depression or anxiety, speech and thought processing grossly intact  ASSESSMENT AND PLAN:  Discussed the following assessment and plan:  Abdominal pain Recently saw GI.  Treated with cipro/flagyl.  Completed cipro last night.  Planning for colonoscopy.  Has been taking omeprazole.  With increased acid reflux.  Discuss with GI regarding question of need for EGD.    Anemia Follow cbc.    Aortic atherosclerosis ([place]) On crestor.   Change in bowel movement Irregular bowel habits as outlined.  Seeing GI.  Planning for colonoscopy.    GERD (gastroesophageal reflux disease) Acid reflux as outlined.  Omeprazole.  Discuss with GI regarding possible EGD.   History of colonic polyps Planning for colonoscopy as outlined.    Hypercholesterolemia On crestor.  Low cholesterol diet and exercise.  Follow lipid panel and liver function tests.    Hypertension Blood pressure under good control.  Continue same medication regimen.  Follow pressures.  Follow metabolic panel.    Hypothyroidism On thyroid replacement.  Follow tsh.    Osteoporosis reclast 11/2018.    SOB (shortness of breath) Persistent sob.  Saw cardiology.  Check cxr.  May need pulmonary evaluation.      I discussed the assessment and treatment plan with the patient. The patient was provided an opportunity to ask questions and all were answered. The patient agreed with the plan and demonstrated  an understanding of the instructions.   The patient was advised to call back or seek an in-person evaluation if the symptoms worsen or if the condition fails to improve as anticipated.   Einar Pheasant, MD

## 2019-06-07 ENCOUNTER — Ambulatory Visit
Admission: RE | Admit: 2019-06-07 | Discharge: 2019-06-07 | Disposition: A | Discharge status: Home / Self Care | Payer: Medicare Other | Source: Ambulatory Visit | Attending: Internal Medicine | Admitting: Internal Medicine

## 2019-06-07 DIAGNOSIS — R0602 Shortness of breath: Secondary | ICD-10-CM

## 2019-06-11 ENCOUNTER — Other Ambulatory Visit: Payer: Self-pay | Admitting: Internal Medicine

## 2019-06-11 DIAGNOSIS — R9389 Abnormal findings on diagnostic imaging of other specified body structures: Secondary | ICD-10-CM

## 2019-06-11 DIAGNOSIS — R0602 Shortness of breath: Secondary | ICD-10-CM

## 2019-06-11 NOTE — Assessment & Plan Note (Signed)
Recently saw GI.  Treated with cipro/flagyl.  Completed cipro last night.  Planning for colonoscopy.  Has been taking omeprazole.  With increased acid reflux.  Discuss with GI regarding question of need for EGD.

## 2019-06-11 NOTE — Assessment & Plan Note (Signed)
Follow cbc.  

## 2019-06-11 NOTE — Assessment & Plan Note (Signed)
Persistent sob.  Saw cardiology.  Check cxr.  May need pulmonary evaluation.

## 2019-06-11 NOTE — Assessment & Plan Note (Signed)
On crestor.   

## 2019-06-11 NOTE — Assessment & Plan Note (Signed)
On crestor.  Low cholesterol diet and exercise.  Follow lipid panel and liver function tests.   

## 2019-06-11 NOTE — Assessment & Plan Note (Signed)
Planning for colonoscopy as outlined.

## 2019-06-11 NOTE — Assessment & Plan Note (Signed)
Blood pressure under good control.  Continue same medication regimen.  Follow pressures.  Follow metabolic panel.   

## 2019-06-11 NOTE — Progress Notes (Signed)
Order placed for pulmonary referral.  

## 2019-06-11 NOTE — Assessment & Plan Note (Signed)
Acid reflux as outlined.  Omeprazole.  Discuss with GI regarding possible EGD.

## 2019-06-11 NOTE — Assessment & Plan Note (Signed)
Irregular bowel habits as outlined.  Seeing GI.  Planning for colonoscopy.

## 2019-06-11 NOTE — Assessment & Plan Note (Signed)
On thyroid replacement.  Follow tsh.  

## 2019-06-11 NOTE — Assessment & Plan Note (Signed)
reclast 11/2018.

## 2019-06-27 ENCOUNTER — Other Ambulatory Visit (INDEPENDENT_AMBULATORY_CARE_PROVIDER_SITE_OTHER): Payer: Medicare PPO

## 2019-06-27 ENCOUNTER — Encounter: Payer: Self-pay | Admitting: Internal Medicine

## 2019-06-27 ENCOUNTER — Other Ambulatory Visit: Payer: Self-pay

## 2019-06-27 DIAGNOSIS — I10 Essential (primary) hypertension: Secondary | ICD-10-CM | POA: Diagnosis not present

## 2019-06-27 DIAGNOSIS — E7800 Pure hypercholesterolemia, unspecified: Secondary | ICD-10-CM | POA: Diagnosis not present

## 2019-06-27 LAB — BASIC METABOLIC PANEL
BUN: 18 mg/dL (ref 6–23)
CO2: 27 mEq/L (ref 19–32)
Calcium: 9.3 mg/dL (ref 8.4–10.5)
Chloride: 104 mEq/L (ref 96–112)
Creatinine, Ser: 0.72 mg/dL (ref 0.40–1.20)
GFR: 79.72 mL/min (ref >60.00)
Glucose, Bld: 93 mg/dL (ref 70–99)
Potassium: 3.8 mEq/L (ref 3.5–5.1)
Sodium: 138 mEq/L (ref 135–145)

## 2019-06-27 LAB — HEPATIC FUNCTION PANEL
ALT: 22 U/L (ref 0–35)
AST: 33 U/L (ref 0–37)
Albumin: 4.1 g/dL (ref 3.5–5.2)
Alkaline Phosphatase: 42 U/L (ref 39–117)
Bilirubin, Direct: 0.1 mg/dL (ref 0.0–0.3)
Total Bilirubin: 0.5 mg/dL (ref 0.2–1.2)
Total Protein: 6.9 g/dL (ref 6.0–8.3)

## 2019-06-27 LAB — LIPID PANEL
Cholesterol: 205 mg/dL — ABNORMAL HIGH (ref 0–200)
HDL: 72.7 mg/dL (ref >39.00)
LDL Cholesterol: 113 mg/dL — ABNORMAL HIGH (ref 0–99)
NonHDL: 132.4
Total CHOL/HDL Ratio: 3
Triglycerides: 95 mg/dL (ref 0.0–149.0)
VLDL: 19 mg/dL (ref 0.0–40.0)

## 2019-07-12 ENCOUNTER — Other Ambulatory Visit: Payer: Self-pay

## 2019-07-12 ENCOUNTER — Encounter: Payer: Self-pay | Admitting: Pulmonary Disease

## 2019-07-12 ENCOUNTER — Ambulatory Visit: Payer: Medicare PPO | Admitting: Pulmonary Disease

## 2019-07-12 VITALS — BP 110/68 | HR 76 | Temp 97.3°F | Ht 63.0 in | Wt 141.4 lb

## 2019-07-12 DIAGNOSIS — R0602 Shortness of breath: Secondary | ICD-10-CM

## 2019-07-12 DIAGNOSIS — J449 Chronic obstructive pulmonary disease, unspecified: Secondary | ICD-10-CM

## 2019-07-12 MED ORDER — SPIRIVA RESPIMAT 2.5 MCG/ACT IN AERS: 2.0000 | INHALATION_SPRAY | Freq: Every day | RESPIRATORY_TRACT | 6 refills | Status: DC

## 2019-07-12 MED ORDER — SPIRIVA RESPIMAT 2.5 MCG/ACT IN AERS: 2.0000 | INHALATION_SPRAY | Freq: Every day | RESPIRATORY_TRACT | 0 refills | Status: DC

## 2019-07-12 NOTE — Patient Instructions (Signed)
I suspect you have a mild case of COPD/chronic bronchitis.  Currently we cannot perform breathing tests due to the COVID-19 pandemic.  I will give you a trial of Spiriva 2 inhalations daily and see how that does for your shortness of breath.  See you in follow-up in 2 months time and hopefully by that time we can determine if breathing test can be performed.

## 2019-07-20 DIAGNOSIS — L309 Dermatitis, unspecified: Secondary | ICD-10-CM | POA: Diagnosis not present

## 2019-07-20 DIAGNOSIS — L308 Other specified dermatitis: Secondary | ICD-10-CM | POA: Diagnosis not present

## 2019-07-26 ENCOUNTER — Ambulatory Visit
Admission: RE | Admit: 2019-07-26 | Discharge: 2019-07-26 | Disposition: A | Discharge status: Home / Self Care | Payer: Medicare PPO | Source: Ambulatory Visit | Attending: Internal Medicine | Admitting: Internal Medicine

## 2019-07-26 DIAGNOSIS — Z1231 Encounter for screening mammogram for malignant neoplasm of breast: Secondary | ICD-10-CM

## 2019-07-29 ENCOUNTER — Other Ambulatory Visit: Payer: Self-pay | Admitting: Internal Medicine

## 2019-07-31 ENCOUNTER — Ambulatory Visit: Payer: Medicare PPO | Attending: Internal Medicine

## 2019-07-31 DIAGNOSIS — Z23 Encounter for immunization: Secondary | ICD-10-CM | POA: Insufficient documentation

## 2019-07-31 NOTE — Progress Notes (Signed)
   Covid-19 Vaccination Clinic  Name:  Savannah Cox    MRN: [id number] DOB: May 23, 1946  07/31/2019  Savannah Cox was observed post Covid-19 immunization for 15 minutes without incidence. She was provided with Vaccine Information Sheet and instruction to access the V-Safe system.   Savannah Cox was instructed to call 911 with any severe reactions post vaccine: Marland Kitchen Difficulty breathing  . Swelling of your face and throat  . A fast heartbeat  . A bad rash all over your body  . Dizziness and weakness    Immunizations Administered    Name Date Dose VIS Date Route   Pfizer COVID-19 Vaccine 07/31/2019  8:12 AM 0.3 mL 05/27/2019 Intramuscular   Manufacturer: [place]   Lot: [id number]   [place]: [id number]

## 2019-08-01 DIAGNOSIS — Z01812 Encounter for preprocedural laboratory examination: Secondary | ICD-10-CM | POA: Diagnosis not present

## 2019-08-04 DIAGNOSIS — K5730 Diverticulosis of large intestine without perforation or abscess without bleeding: Secondary | ICD-10-CM | POA: Diagnosis not present

## 2019-08-04 DIAGNOSIS — K449 Diaphragmatic hernia without obstruction or gangrene: Secondary | ICD-10-CM | POA: Diagnosis not present

## 2019-08-04 DIAGNOSIS — K529 Noninfective gastroenteritis and colitis, unspecified: Secondary | ICD-10-CM | POA: Diagnosis not present

## 2019-08-04 DIAGNOSIS — K921 Melena: Secondary | ICD-10-CM | POA: Diagnosis not present

## 2019-08-04 DIAGNOSIS — R194 Change in bowel habit: Secondary | ICD-10-CM | POA: Diagnosis not present

## 2019-08-04 DIAGNOSIS — R1032 Left lower quadrant pain: Secondary | ICD-10-CM | POA: Diagnosis not present

## 2019-08-04 DIAGNOSIS — R12 Heartburn: Secondary | ICD-10-CM | POA: Diagnosis not present

## 2019-08-04 DIAGNOSIS — K648 Other hemorrhoids: Secondary | ICD-10-CM | POA: Diagnosis not present

## 2019-08-10 DIAGNOSIS — D692 Other nonthrombocytopenic purpura: Secondary | ICD-10-CM | POA: Diagnosis not present

## 2019-08-10 DIAGNOSIS — L308 Other specified dermatitis: Secondary | ICD-10-CM | POA: Diagnosis not present

## 2019-08-10 DIAGNOSIS — H01133 Eczematous dermatitis of right eye, unspecified eyelid: Secondary | ICD-10-CM | POA: Diagnosis not present

## 2019-08-10 DIAGNOSIS — L219 Seborrheic dermatitis, unspecified: Secondary | ICD-10-CM | POA: Diagnosis not present

## 2019-08-10 DIAGNOSIS — H01136 Eczematous dermatitis of left eye, unspecified eyelid: Secondary | ICD-10-CM | POA: Diagnosis not present

## 2019-08-23 ENCOUNTER — Ambulatory Visit: Payer: Medicare PPO | Attending: Internal Medicine

## 2019-08-23 DIAGNOSIS — Z23 Encounter for immunization: Secondary | ICD-10-CM

## 2019-08-23 NOTE — Progress Notes (Signed)
   Covid-19 Vaccination Clinic  Name:  Savannah Cox    MRN: [id number] DOB: 1946/02/03  08/23/2019  [name] was observed post Covid-19 immunization for 15 minutes without incident. She was provided with Vaccine Information Sheet and instruction to access the V-Safe system.   [name] was instructed to call 911 with any severe reactions post vaccine: Marland Kitchen Difficulty breathing  . Swelling of face and throat  . A fast heartbeat  . A bad rash all over body  . Dizziness and weakness   Immunizations Administered    Name Date Dose VIS Date Route   Pfizer COVID-19 Vaccine 08/23/2019  3:18 PM 0.3 mL 05/27/2019 Intramuscular   Manufacturer: [place]   Lot: [id number]   [place]: [id number]

## 2019-09-08 ENCOUNTER — Ambulatory Visit (INDEPENDENT_AMBULATORY_CARE_PROVIDER_SITE_OTHER): Payer: Medicare PPO | Admitting: Internal Medicine

## 2019-09-08 ENCOUNTER — Encounter: Payer: Self-pay | Admitting: Internal Medicine

## 2019-09-08 ENCOUNTER — Other Ambulatory Visit: Payer: Self-pay

## 2019-09-08 VITALS — BP 118/76 | HR 74 | Temp 98.2°F | Resp 16 | Ht 63.0 in | Wt 139.4 lb

## 2019-09-08 DIAGNOSIS — M810 Age-related osteoporosis without current pathological fracture: Secondary | ICD-10-CM | POA: Diagnosis not present

## 2019-09-08 DIAGNOSIS — K219 Gastro-esophageal reflux disease without esophagitis: Secondary | ICD-10-CM

## 2019-09-08 DIAGNOSIS — D72819 Decreased white blood cell count, unspecified: Secondary | ICD-10-CM | POA: Diagnosis not present

## 2019-09-08 DIAGNOSIS — D649 Anemia, unspecified: Secondary | ICD-10-CM | POA: Diagnosis not present

## 2019-09-08 DIAGNOSIS — E039 Hypothyroidism, unspecified: Secondary | ICD-10-CM | POA: Diagnosis not present

## 2019-09-08 DIAGNOSIS — I700 Atherosclerosis of aorta: Secondary | ICD-10-CM | POA: Diagnosis not present

## 2019-09-08 DIAGNOSIS — I10 Essential (primary) hypertension: Secondary | ICD-10-CM | POA: Diagnosis not present

## 2019-09-08 DIAGNOSIS — Z0000 Encounter for general adult medical examination without abnormal findings: Secondary | ICD-10-CM

## 2019-09-08 DIAGNOSIS — E7800 Pure hypercholesterolemia, unspecified: Secondary | ICD-10-CM

## 2019-09-08 NOTE — Progress Notes (Signed)
Patient ID: Lei Michela, female   DOB: 11-13-1945, 74 y.o.   MRN: [id number]   Subjective:    Patient ID: Kimyah Peyser, female    DOB: 10-May-1946, 74 y.o.   MRN: [id number]  HPI This visit occurred during the SARS-CoV-2 public health emergency.  Safety protocols were in place, including screening questions prior to the visit, additional usage of staff PPE, and extensive cleaning of exam room while observing appropriate contact time as indicated for disinfecting solutions.  Patient here for her physical exam.  She reports she is doing relatively well.  Her main complaint is continued problems with her eyes.  Feels like they are "sticking".  She has seen ophthalmology. Has tried drops, etc.  Was questioning if allergies.  Plans to f/u with ophthalmology.  Tries to stay active.  No chest pain or sob reported.  No abdominal pain or bowel change reported.  bowells stable.  Sees pulmonary.  Diagnosed with mild copd/chronic bronchitis.  Was started on spiriva.  Did not feel helped.  Feels breathing stable.    Past Medical History:  Diagnosis Date  . Allergic state   . Anemia   . Complication of anesthesia   . Diverticulosis   . GERD (gastroesophageal reflux disease)   . History of hiatal hernia   . History of kidney stones   . Hypercholesterolemia   . Hypertension   . Hypothyroidism   . Osteoporosis, postmenopausal   . PONV (postoperative nausea and vomiting)    nauseated with colonoscopy  . Shingles    Past Surgical History:  Procedure Laterality Date  . BREAST BIOPSY Left 90s   benign  . COLONOSCOPY WITH PROPOFOL N/A 01/28/2016   Procedure: COLONOSCOPY WITH PROPOFOL;  Surgeon: Lollie Sails, MD;  Location: [facility] ENDOSCOPY;  Service: Endoscopy;  Laterality: N/A;  . EXCISION METACARPAL MASS Right 04/30/2017   Procedure: EXCISION MUCOID CYST FIFTH FINGER RIGHT HAND;  Surgeon: Hessie Knows, MD;  Location: [facility] ORS;  Service: Orthopedics;  Laterality: Right;  . EYE SURGERY  Bilateral 2010   cataracts   Family History  Problem Relation Age of Onset  . Heart disease Father        died age 37 - myocardial infarction  . Hypercholesterolemia Mother   . Arthritis Mother   . Kidney cancer Other        aunt  . Breast cancer Maternal Aunt   . Colon cancer Neg Hx    Social History   Socioeconomic History  . Marital status: Married    Spouse name: Not on file  . Number of children: 3  . Years of education: 52  . Highest education level: Not on file  Occupational History  . Occupation: retired Product manager: RETIRED  Tobacco Use  . Smoking status: Former Smoker    Quit date: 1990    Years since quitting: 31.2  . Smokeless tobacco: Never Used  Substance and Sexual Activity  . Alcohol use: Yes    Alcohol/week: 0.0 standard drinks    Comment: Rare drink, one drink per month  . Drug use: No  . Sexual activity: Yes  Other Topics Concern  . Not on file  Social History Narrative  . Not on file   Social Determinants of Health   Financial Resource Strain:   . Difficulty of Paying Living Expenses:   Food Insecurity:   . Worried About Charity fundraiser in the Last Year:   . [place] in the  Last Year:   Transportation Needs:   . Film/video editor (Medical):   Marland Kitchen Lack of Transportation (Non-Medical):   Physical Activity:   . Days of Exercise per Week:   . Minutes of Exercise per Session:   Stress:   . Feeling of Stress :   Social Connections:   . Frequency of Communication with Friends and Family:   . Frequency of Social Gatherings with Friends and Family:   . Attends Religious Services:   . Active Member of Clubs or Organizations:   . Attends Archivist Meetings:   Marland Kitchen Marital Status:     Outpatient Encounter Medications as of 09/08/2019  Medication Sig  . lisinopril-hydrochlorothiazide (ZESTORETIC) 10-12.5 MG tablet TAKE 1 TABLET BY MOUTH EVERY DAY  . betamethasone dipropionate 0.05 % cream Apply topically 2 (two)  times daily as needed.  Marland Kitchen levothyroxine (SYNTHROID) 75 MCG tablet TAKE 1 TABLET BY MOUTH EVERY DAY  . loratadine (CLARITIN) 10 MG tablet Take 10 mg by mouth daily.   . Multiple Vitamins-Minerals (CENTRUM SILVER ULTRA WOMENS PO) Take 1 tablet by mouth daily.   Marland Kitchen omeprazole (PRILOSEC) 40 MG capsule TAKE 1 CAPSULE (40 MG TOTAL) BY MOUTH ONCE DAILY TAKE 30 MINS BEFORE A MEAL  . rosuvastatin (CRESTOR) 5 MG tablet TAKE 1 TABLET BY MOUTH EVERY DAY  . sertraline (ZOLOFT) 50 MG tablet TAKE 1 TABLET BY MOUTH EVERY DAY  . Tiotropium Bromide Monohydrate (SPIRIVA RESPIMAT) 2.5 MCG/ACT AERS Inhale 2 puffs into the lungs daily.  . [DISCONTINUED] Tiotropium Bromide Monohydrate (SPIRIVA RESPIMAT) 2.5 MCG/ACT AERS Inhale 2 puffs into the lungs daily.   No facility-administered encounter medications on file as of 09/08/2019.    Review of Systems  Constitutional: Negative for appetite change and unexpected weight change.  HENT: Negative for congestion and sinus pressure.   Eyes: Negative for pain and visual disturbance.  Respiratory: Negative for cough, chest tightness and shortness of breath.   Cardiovascular: Negative for chest pain, palpitations and leg swelling.  Gastrointestinal: Negative for abdominal pain, diarrhea, nausea and vomiting.  Genitourinary: Negative for difficulty urinating and dysuria.  Musculoskeletal: Negative for joint swelling and myalgias.  Skin: Negative for color change and rash.  Neurological: Negative for dizziness, light-headedness and headaches.  Hematological: Negative for adenopathy. Does not bruise/bleed easily.  Psychiatric/Behavioral: Negative for agitation and dysphoric mood.       Objective:    Physical Exam Constitutional:      General: She is not in acute distress.    Appearance: Normal appearance. She is well-developed.  HENT:     Head: Normocephalic and atraumatic.     Right Ear: External ear normal.     Left Ear: External ear normal.  Eyes:     General:  No scleral icterus.       Right eye: No discharge.        Left eye: No discharge.     Conjunctiva/sclera: Conjunctivae normal.  Neck:     Thyroid: No thyromegaly.  Cardiovascular:     Rate and Rhythm: Normal rate and regular rhythm.  Pulmonary:     Effort: No tachypnea, accessory muscle usage or respiratory distress.     Breath sounds: Normal breath sounds. No decreased breath sounds or wheezing.  Chest:     Breasts:        Right: No inverted nipple, mass, nipple discharge or tenderness (no axillary adenopathy).        Left: No inverted nipple, mass, nipple discharge or tenderness (no axilarry adenopathy).  Abdominal:     General: Bowel sounds are normal.     Palpations: Abdomen is soft.     Tenderness: There is no abdominal tenderness.  Musculoskeletal:        General: No swelling or tenderness.     Cervical back: Neck supple. No tenderness.  Lymphadenopathy:     Cervical: No cervical adenopathy.  Skin:    Findings: No erythema or rash.  Neurological:     Mental Status: She is alert and oriented to person, place, and time.  Psychiatric:        Mood and Affect: Mood normal.        Behavior: Behavior normal.     BP 118/76   Pulse 74   Temp 98.2 F (36.8 C)   Resp 16   Ht 5\' 3"  (1.6 m)   Wt 139 lb 6.4 oz (63.2 kg)   SpO2 98%   BMI 24.69 kg/m  Wt Readings from Last 3 Encounters:  09/08/19 139 lb 6.4 oz (63.2 kg)  07/12/19 141 lb 6.4 oz (64.1 kg)  06/06/19 139 lb (63 kg)     Lab Results  Component Value Date   WBC 4.1 12/22/2018   HGB 13.5 12/22/2018   HCT 40.7 12/22/2018   PLT 240.0 12/22/2018   GLUCOSE 93 06/27/2019   CHOL 205 (H) 06/27/2019   TRIG 95.0 06/27/2019   HDL 72.70 06/27/2019   LDLDIRECT 165.9 05/11/2013   LDLCALC 113 (H) 06/27/2019   ALT 22 06/27/2019   AST 33 06/27/2019   NA 138 06/27/2019   K 3.8 06/27/2019   CL 104 06/27/2019   CREATININE 0.72 06/27/2019   BUN 18 06/27/2019   CO2 27 06/27/2019   TSH 0.36 12/01/2018   INR 0.96  04/14/2016    MM 3D SCREEN BREAST BILATERAL  Result Date: 07/27/2019 CLINICAL DATA:  Screening. EXAM: DIGITAL SCREENING BILATERAL MAMMOGRAM WITH TOMO AND CAD COMPARISON:  Previous exam(s). ACR Breast Density Category c: The breast tissue is heterogeneously dense, which may obscure small masses. FINDINGS: There are no findings suspicious for malignancy. Images were processed with CAD. IMPRESSION: No mammographic evidence of malignancy. A result letter of this screening mammogram will be mailed directly to the patient. RECOMMENDATION: Screening mammogram in one year. (Code:SM-B-01Y) BI-RADS CATEGORY  1: Negative. Electronically Signed   By: Curlene Dolphin M.D.   On: 07/27/2019 13:54       Assessment & Plan:   Problem List Items Addressed This Visit    Anemia    Follow cbc.  Had colonoscopy and EGD.  Need reports.       Aortic atherosclerosis ([place])    On crestor.       GERD (gastroesophageal reflux disease)    Controlled on omeprazole.  Sees GI.       Health care maintenance    Physical today 09/08/19.  PAP 04/2017 - negative with negative HPV.  Mammogram 07/27/19 - [facility] .  Need colonoscopy results.        Hypercholesterolemia    Low cholesterol diet and exercise.  On crestor.  Follow lipid panel and liver function tests.        Relevant Orders   Hepatic function panel   Lipid panel   Hypertension    Blood pressure under good control.  Continue lisinopril/hctz.  Follow pressures.  Follow metabolic panel.        Relevant Orders   Basic metabolic panel   Hypothyroidism    On thyroid replacement.  Follow tsh.  Leukopenia    White count slightly decreased on recent lab.  Recheck cbc with next labs.        Relevant Orders   CBC with Differential/Platelet   Osteoporosis    reclast 11/2018.       Other Visit Diagnoses    Routine general medical examination at a health care facility    -  Primary       Einar Pheasant, MD

## 2019-09-08 NOTE — Assessment & Plan Note (Addendum)
Physical today 09/08/19.  PAP 04/2017 - negative with negative HPV.  Mammogram 07/27/19 - [facility] .  Need colonoscopy results.

## 2019-09-12 ENCOUNTER — Encounter: Payer: Self-pay | Admitting: Internal Medicine

## 2019-09-12 DIAGNOSIS — D72819 Decreased white blood cell count, unspecified: Secondary | ICD-10-CM | POA: Insufficient documentation

## 2019-09-12 NOTE — Assessment & Plan Note (Signed)
On crestor.   

## 2019-09-12 NOTE — Assessment & Plan Note (Signed)
On thyroid replacement.  Follow tsh.  

## 2019-09-12 NOTE — Assessment & Plan Note (Signed)
Low cholesterol diet and exercise.  On crestor.  Follow lipid panel and liver function tests.  

## 2019-09-12 NOTE — Assessment & Plan Note (Signed)
Follow cbc.  Had colonoscopy and EGD.  Need reports.

## 2019-09-12 NOTE — Assessment & Plan Note (Signed)
Controlled on omeprazole.  Sees GI.   

## 2019-09-12 NOTE — Assessment & Plan Note (Signed)
reclast 11/2018.

## 2019-09-12 NOTE — Assessment & Plan Note (Signed)
White count slightly decreased on recent lab.  Recheck cbc with next labs.

## 2019-09-12 NOTE — Assessment & Plan Note (Signed)
Blood pressure under good control.  Continue lisinopril/hctz.  Follow pressures.  Follow metabolic panel.  

## 2019-10-20 ENCOUNTER — Other Ambulatory Visit: Payer: Self-pay | Admitting: Internal Medicine

## 2019-10-20 DIAGNOSIS — R0602 Shortness of breath: Secondary | ICD-10-CM | POA: Diagnosis not present

## 2019-10-20 DIAGNOSIS — I10 Essential (primary) hypertension: Secondary | ICD-10-CM | POA: Diagnosis not present

## 2019-10-20 DIAGNOSIS — I700 Atherosclerosis of aorta: Secondary | ICD-10-CM | POA: Diagnosis not present

## 2019-10-20 DIAGNOSIS — R079 Chest pain, unspecified: Secondary | ICD-10-CM | POA: Diagnosis not present

## 2019-10-27 ENCOUNTER — Ambulatory Visit (INDEPENDENT_AMBULATORY_CARE_PROVIDER_SITE_OTHER): Payer: Medicare PPO

## 2019-10-27 ENCOUNTER — Other Ambulatory Visit: Payer: Self-pay | Admitting: Internal Medicine

## 2019-10-27 VITALS — Ht 63.0 in | Wt 140.0 lb

## 2019-10-27 DIAGNOSIS — Z0000 Encounter for general adult medical examination without abnormal findings: Secondary | ICD-10-CM | POA: Diagnosis not present

## 2019-10-27 DIAGNOSIS — I10 Essential (primary) hypertension: Secondary | ICD-10-CM

## 2019-10-27 NOTE — Progress Notes (Addendum)
Subjective:   Savannah Cox is a 74 y.o. female who presents for Medicare Annual (Subsequent) preventive examination.  Review of Systems:  No ROS.  Medicare Wellness Virtual Visit.  Visual/audio telehealth visit, UTA vital signs.   Ht/Wt provided.  See social history for additional risk factors.   Cardiac Risk Factors include: advanced age (>21men, >3 women);hypertension     Objective:     Vitals: Ht 5\' 3"  (1.6 m)   Wt 140 lb (63.5 kg)   BMI 24.80 kg/m   Body mass index is 24.8 kg/m.  Advanced Directives 10/27/2019 08/16/2018 03/22/2018 04/30/2017 04/20/2017 03/19/2017 04/14/2016  Does Patient Have a Medical Advance Directive? No No No No No No Yes  Type of Advance Directive - - - - - - Press photographer;Living will  Copy of [place] in Chart? - - - - - - No - copy requested  Would patient like information on creating a medical advance directive? Yes (MAU/Ambulatory/Procedural Areas - Information given) No - Patient declined No - Patient declined No - Patient declined No - Patient declined No - Patient declined -    Tobacco Social History   Tobacco Use  Smoking Status Former Smoker  . Quit date: 26  . Years since quitting: 31.3  Smokeless Tobacco Never Used     Counseling given: Not Answered   Clinical Intake:  Pre-visit preparation completed: Yes        Diabetes: No  How often do you need to have someone help you when you read instructions, pamphlets, or other written materials from your doctor or pharmacy?: 1 - Never  Interpreter Needed?: No     Past Medical History:  Diagnosis Date  . Allergic state   . Anemia   . Complication of anesthesia   . Diverticulosis   . GERD (gastroesophageal reflux disease)   . History of hiatal hernia   . History of kidney stones   . Hypercholesterolemia   . Hypertension   . Hypothyroidism   . Osteoporosis, postmenopausal   . PONV (postoperative nausea and vomiting)    nauseated  with colonoscopy  . Shingles    Past Surgical History:  Procedure Laterality Date  . BREAST BIOPSY Left 90s   benign  . COLONOSCOPY WITH PROPOFOL N/A 01/28/2016   Procedure: COLONOSCOPY WITH PROPOFOL;  Surgeon: Lollie Sails, MD;  Location: [facility] ENDOSCOPY;  Service: Endoscopy;  Laterality: N/A;  . EXCISION METACARPAL MASS Right 04/30/2017   Procedure: EXCISION MUCOID CYST FIFTH FINGER RIGHT HAND;  Surgeon: Hessie Knows, MD;  Location: [facility] ORS;  Service: Orthopedics;  Laterality: Right;  . EYE SURGERY Bilateral 2010   cataracts   Family History  Problem Relation Age of Onset  . Heart disease Father        died age 35 - myocardial infarction  . Hypercholesterolemia Mother   . Arthritis Mother   . Heart attack Mother   . Kidney cancer Other        aunt  . Breast cancer Maternal Aunt   . Colon cancer Neg Hx    Social History   Socioeconomic History  . Marital status: Married    Spouse name: Not on file  . Number of children: 3  . Years of education: 71  . Highest education level: Not on file  Occupational History  . Occupation: retired Product manager: RETIRED  Tobacco Use  . Smoking status: Former Smoker    Quit date: 1990  Years since quitting: 31.3  . Smokeless tobacco: Never Used  Substance and Sexual Activity  . Alcohol use: Yes    Alcohol/week: 0.0 standard drinks    Comment: Rare drink, one drink per month  . Drug use: No  . Sexual activity: Yes  Other Topics Concern  . Not on file  Social History Narrative  . Not on file   Social Determinants of Health   Financial Resource Strain:   . Difficulty of Paying Living Expenses:   Food Insecurity:   . Worried About Charity fundraiser in the Last Year:   . Arboriculturist in the Last Year:   Transportation Needs:   . Film/video editor (Medical):   Marland Kitchen Lack of Transportation (Non-Medical):   Physical Activity:   . Days of Exercise per Week:   . Minutes of Exercise per Session:   Stress:   .  Feeling of Stress :   Social Connections:   . Frequency of Communication with Friends and Family:   . Frequency of Social Gatherings with Friends and Family:   . Attends Religious Services:   . Active Member of Clubs or Organizations:   . Attends Archivist Meetings:   Marland Kitchen Marital Status:     Outpatient Encounter Medications as of 10/27/2019  Medication Sig  . lisinopril-hydrochlorothiazide (ZESTORETIC) 10-12.5 MG tablet TAKE 1 TABLET BY MOUTH EVERY DAY  . betamethasone dipropionate 0.05 % cream Apply topically 2 (two) times daily as needed.  Marland Kitchen levothyroxine (SYNTHROID) 75 MCG tablet TAKE 1 TABLET BY MOUTH EVERY DAY  . loratadine (CLARITIN) 10 MG tablet Take 10 mg by mouth daily.   . Multiple Vitamins-Minerals (CENTRUM SILVER ULTRA WOMENS PO) Take 1 tablet by mouth daily.   Marland Kitchen omeprazole (PRILOSEC) 40 MG capsule TAKE 1 CAPSULE (40 MG TOTAL) BY MOUTH ONCE DAILY TAKE 30 MINS BEFORE A MEAL (Patient not taking: Reported on 10/27/2019)  . rosuvastatin (CRESTOR) 5 MG tablet TAKE 1 TABLET BY MOUTH EVERY DAY  . sertraline (ZOLOFT) 50 MG tablet TAKE 1 TABLET BY MOUTH EVERY DAY  . Tiotropium Bromide Monohydrate (SPIRIVA RESPIMAT) 2.5 MCG/ACT AERS Inhale 2 puffs into the lungs daily.   No facility-administered encounter medications on file as of 10/27/2019.    Activities of Daily Living In your present state of health, do you have any difficulty performing the following activities: 10/27/2019  Hearing? N  Vision? N  Difficulty concentrating or making decisions? N  Walking or climbing stairs? N  Dressing or bathing? N  Doing errands, shopping? N  Preparing Food and eating ? N  Using the Toilet? N  In the past six months, have you accidently leaked urine? N  Do you have problems with loss of bowel control? Y  Comment Managed with brief. Followed by GI.  Managing your Medications? N  Managing your Finances? N  Housekeeping or managing your Housekeeping? N  Some recent data might be  hidden    Patient Care Team: Einar Pheasant, MD as PCP - General (Internal Medicine)    Assessment:   This is a routine wellness examination for Savannah Cox.  Nurse connected with patient 10/27/19 at 10:30 AM EDT by a telephone enabled telemedicine application and verified that I am speaking with the correct person using two identifiers. Patient stated full name and DOB. Patient gave permission to continue with virtual visit. Patient's location was at home and Nurse's location was at [place] office.   Patient is alert and oriented x3. Patient denies difficulty  focusing or concentrating. Patient likes to read daily, play solitaire and stays socially active which assists with brain health.  Health Maintenance Due: See completed HM at the end of note.   Eye: Visual acuity not assessed. Virtual visit. Followed by their ophthalmologist.  Dental: Visits every 6 months.    Hearing: Demonstrates normal hearing during visit.  Safety:  Patient feels safe at home- yes Patient does have smoke detectors at home- yes Patient does wear sunscreen or protective clothing when in direct sunlight - yes Patient does wear seat belt when in a moving vehicle - yes Patient drives- yes Adequate lighting in walkways free from debris- yes Grab bars and handrails used as appropriate- yes Ambulates with an assistive device- no  Social: Alcohol intake - yes      Smoking history- former Smokers in home? none Illicit drug use? none  Medication: Plans to contact GI for refill with omeprazole. If difficulty with refill due to change of GI physician, patient will contact PMD.  Self managed - yes   Covid-19: Precautions and sickness symptoms discussed. Wears mask, social distancing, hand hygiene as appropriate.   Activities of Daily Living Patient denies needing assistance with: household chores, feeding themselves, getting from bed to chair, getting to the toilet, bathing/showering, dressing, managing  money, or preparing meals.   Discussed the importance of a healthy diet, water intake and the benefits of aerobic exercise.   Physical activity- Silver sneaker aerobic, balance, weights exercise program twice weekly, 45 minutes  Diet:  Regular Water: good intake Caffeine: 3-4 cups of coffee  Other Providers Patient Care Team: Einar Pheasant, MD as PCP - General (Internal Medicine)  Exercise Activities and Dietary recommendations Current Exercise Habits: Structured exercise class, Type of exercise: strength training/weights;calisthenics;stretching, Time (Minutes): 45, Frequency (Times/Week): 2, Weekly Exercise (Minutes/Week): 90, Intensity: Mild  Goals    . Healthy Lifestyle     Healthy diet Stay active Stay hydrated Maintain weight 135lb-140lb       Fall Risk Fall Risk  10/27/2019 06/06/2019 03/22/2018 03/19/2017 03/17/2016  Falls in the past year? 0 0 No No No  Follow up Falls evaluation completed Falls evaluation completed - - -   Timed Get Up and Go performed: no, virtual visit  Depression Screen PHQ 2/9 Scores 10/27/2019 06/06/2019 03/22/2018 03/19/2017  PHQ - 2 Score 0 0 0 0  PHQ- 9 Score - - - 0     Cognitive Function MMSE - Mini Mental State Exam 10/27/2019 03/22/2018 03/19/2017 03/17/2016  Not completed: Unable to complete - - -  Orientation to time - 5 5 5   Orientation to Place - 5 5 5   Registration - 3 3 3   Attention/ Calculation - 5 5 5   Recall - 3 3 3   Language- name 2 objects - 2 2 2   Language- repeat - 1 1 1   Language- follow 3 step command - 3 3 3   Language- read & follow direction - 1 1 1   Write a sentence - 1 1 1   Copy design - 1 1 1   Total score - 30 30 30         Immunization History  Administered Date(s) Administered  . DTaP 12/09/2010  . Influenza Split 04/29/2012  . Influenza, High Dose Seasonal PF 02/19/2016, 03/19/2017, 06/03/2018  . Influenza,inj,Quad PF,6+ Mos 02/21/2013, 02/13/2014, 05/14/2015  . Influenza-Unspecified 02/16/2019  .  PFIZER SARS-COV-2 Vaccination 07/31/2019, 08/23/2019  . Pneumococcal Conjugate-13 04/28/2013  . Pneumococcal Polysaccharide-23 02/13/2014  . Zoster 10/12/2011  . Zoster Recombinat (Shingrix)  02/01/2019, 05/16/2019   Screening Tests Health Maintenance  Topic Date Due  . INFLUENZA VACCINE  01/15/2020  . MAMMOGRAM  07/25/2020  . TETANUS/TDAP  12/08/2020  . COLONOSCOPY  01/27/2021  . DEXA SCAN  Completed  . COVID-19 Vaccine  Completed  . Hepatitis C Screening  Completed  . PNA vac Low Risk Adult  Completed      Plan:   Keep all routine maintenance appointments.   Next scheduled lab 01/05/20 @ 8:00. Patient request routine urine be added. No symptoms. Deferred to PMD.   Follow up 01/09/20 @ 10:30  Medicare Attestation I have personally reviewed: The patient's medical and social history Their use of alcohol, tobacco or illicit drugs Their current medications and supplements The patient's functional ability including ADLs,fall risks, home safety risks, cognitive, and hearing and visual impairment Diet and physical activities Evidence for depression   I have reviewed and discussed with patient certain preventive protocols, quality metrics, and best practice recommendations.      Varney Biles, LPN  [id number]   Reviewed above information.  Will add urine to next labs.  Agree with plan.    Dr Nicki Reaper

## 2019-10-27 NOTE — Patient Instructions (Addendum)
  Savannah Cox , Thank you for taking time to come for your Medicare Wellness Visit. I appreciate your ongoing commitment to your health goals. Please review the following plan we discussed and let me know if I can assist you in the future.   These are the goals we discussed: Goals    . Healthy Lifestyle     Healthy diet Stay active Stay hydrated Maintain weight 135lb-140lb       This is a list of the screening recommended for you and due dates:  Health Maintenance  Topic Date Due  . Flu Shot  01/15/2020  . Mammogram  07/25/2020  . Tetanus Vaccine  12/08/2020  . Colon Cancer Screening  01/27/2021  . DEXA scan (bone density measurement)  Completed  . COVID-19 Vaccine  Completed  .  Hepatitis C: One time screening is recommended by Center for Disease Control  (CDC) for  adults born from [birth year] through 1963.   Completed  . Pneumonia vaccines  Completed

## 2019-10-27 NOTE — Progress Notes (Signed)
Order placed for f/u urinalysis 

## 2019-11-24 ENCOUNTER — Encounter: Payer: Self-pay | Admitting: Internal Medicine

## 2019-11-25 ENCOUNTER — Other Ambulatory Visit: Payer: Self-pay | Admitting: Internal Medicine

## 2019-12-13 ENCOUNTER — Other Ambulatory Visit: Payer: Self-pay

## 2019-12-13 ENCOUNTER — Ambulatory Visit: Payer: Medicare PPO | Admitting: Pulmonary Disease

## 2019-12-13 ENCOUNTER — Encounter: Payer: Self-pay | Admitting: Pulmonary Disease

## 2019-12-13 VITALS — BP 124/64 | HR 69 | Temp 98.6°F | Ht 63.0 in | Wt 142.4 lb

## 2019-12-13 DIAGNOSIS — J449 Chronic obstructive pulmonary disease, unspecified: Secondary | ICD-10-CM

## 2019-12-13 DIAGNOSIS — R0602 Shortness of breath: Secondary | ICD-10-CM

## 2019-12-13 MED ORDER — STIOLTO RESPIMAT 2.5-2.5 MCG/ACT IN AERS
2.0000 | INHALATION_SPRAY | Freq: Every day | RESPIRATORY_TRACT | 0 refills | Status: DC
Start: 2019-12-13 — End: 2019-12-26

## 2019-12-13 NOTE — Progress Notes (Signed)
    Assessment & Plan:  1. COPD suggested by initial evaluation (Primary)  2. SOB (shortness of breath) - Pulmonary Function Test [facility] Only; Future   Patient Instructions  What we discussed today:  We are going to order breathing tests We are going to give you a trial of Stiolto which has Spiriva  with an extra ingredient in it.  Let us  know how this works for you so we can call it into your pharmacy We will see you in follow-up in 2 months time call sooner should any new problems arise  Please note: late entry documentation due to logistical difficulties during COVID-19 pandemic. This note is filed for information purposes only, and is not intended to be used for billing, nor does it represent the full scope/nature of the visit in question. Please see any associated scanned media linked to date of encounter for additional pertinent information.  Subjective:    HPI: Savannah Cox is a 74 y.o. female presenting to the pulmonology clinic on 12/13/2019 with report of: Follow-up (SOB unchanged )     Outpatient Encounter Medications as of 12/13/2019  Medication Sig   loratadine  (CLARITIN ) 10 MG tablet Take 10 mg by mouth daily.    [DISCONTINUED] betamethasone  dipropionate 0.05 % cream Apply topically 2 (two) times daily as needed. (Patient not taking: Reported on 02/14/2021)   [DISCONTINUED] levothyroxine  (SYNTHROID ) 75 MCG tablet TAKE 1 TABLET BY MOUTH EVERY DAY   [DISCONTINUED] lisinopril -hydrochlorothiazide  (ZESTORETIC ) 10-12.5 MG tablet TAKE 1 TABLET BY MOUTH EVERY DAY   [DISCONTINUED] Multiple Vitamins-Minerals (CENTRUM SILVER ULTRA WOMENS PO) Take 1 tablet by mouth daily.    [DISCONTINUED] omeprazole  (PRILOSEC ) 40 MG capsule TAKE 1 CAPSULE (40 MG TOTAL) BY MOUTH ONCE DAILY TAKE 30 MINS BEFORE A MEAL   [DISCONTINUED] rosuvastatin  (CRESTOR ) 5 MG tablet TAKE 1 TABLET BY MOUTH EVERY DAY   [DISCONTINUED] sertraline  (ZOLOFT ) 50 MG tablet TAKE 1 TABLET BY MOUTH EVERY DAY    [DISCONTINUED] Tiotropium Bromide Monohydrate  (SPIRIVA  RESPIMAT) 2.5 MCG/ACT AERS Inhale 2 puffs into the lungs daily.   [DISCONTINUED] Tiotropium Bromide-Olodaterol (STIOLTO RESPIMAT ) 2.5-2.5 MCG/ACT AERS Inhale 2 puffs into the lungs daily.   No facility-administered encounter medications on file as of 12/13/2019.      Objective:   Vitals:   12/13/19 1418  BP: 124/64  Pulse: 69  Temp: 98.6 F (37 C)  Height: 5' 3 (1.6 m)  Weight: 142 lb 6.4 oz (64.6 kg)  SpO2: 96%  TempSrc: Temporal  BMI (Calculated): 25.23     Physical exam documentation is limited by delayed entry of information.

## 2019-12-13 NOTE — Patient Instructions (Signed)
What we discussed today:   We are going to order breathing tests  We are going to give you a trial of Stiolto which has Spiriva with an extra ingredient in it.  Let us know how this works for you so we can call it into your pharmacy  We will see you in follow-up in 2 months time call sooner should any new problems arise

## 2019-12-26 ENCOUNTER — Telehealth: Payer: Self-pay | Admitting: Pulmonary Disease

## 2019-12-26 MED ORDER — STIOLTO RESPIMAT 2.5-2.5 MCG/ACT IN AERS: 2.0000 | INHALATION_SPRAY | Freq: Every day | RESPIRATORY_TRACT | 2 refills | Status: DC

## 2019-12-26 NOTE — Telephone Encounter (Signed)
Pt would like a Rx for stiolto, as she feels this medication is effective.  Rx has been sent to preferred pharmacy. Pt is aware and voiced her understanding.  Nothing further is needed at this time.

## 2020-01-05 ENCOUNTER — Other Ambulatory Visit: Payer: Self-pay

## 2020-01-05 ENCOUNTER — Other Ambulatory Visit (INDEPENDENT_AMBULATORY_CARE_PROVIDER_SITE_OTHER): Payer: Medicare PPO

## 2020-01-05 DIAGNOSIS — D72819 Decreased white blood cell count, unspecified: Secondary | ICD-10-CM

## 2020-01-05 DIAGNOSIS — I10 Essential (primary) hypertension: Secondary | ICD-10-CM | POA: Diagnosis not present

## 2020-01-05 DIAGNOSIS — E7800 Pure hypercholesterolemia, unspecified: Secondary | ICD-10-CM

## 2020-01-05 LAB — LIPID PANEL
Cholesterol: 187 mg/dL (ref 0–200)
HDL: 74.1 mg/dL (ref >39.00)
LDL Cholesterol: 98 mg/dL (ref 0–99)
NonHDL: 113.1
Total CHOL/HDL Ratio: 3
Triglycerides: 77 mg/dL (ref 0.0–149.0)
VLDL: 15.4 mg/dL (ref 0.0–40.0)

## 2020-01-05 LAB — URINALYSIS, ROUTINE W REFLEX MICROSCOPIC
Bilirubin Urine: NEGATIVE
Ketones, ur: NEGATIVE
Leukocytes,Ua: NEGATIVE
Nitrite: NEGATIVE
Specific Gravity, Urine: 1.015 (ref 1.000–1.030)
Total Protein, Urine: NEGATIVE
Urine Glucose: NEGATIVE
Urobilinogen, UA: 0.2 (ref 0.0–1.0)
pH: 7 (ref 5.0–8.0)

## 2020-01-05 LAB — BASIC METABOLIC PANEL
BUN: 17 mg/dL (ref 6–23)
CO2: 28 mEq/L (ref 19–32)
Calcium: 9.3 mg/dL (ref 8.4–10.5)
Chloride: 105 mEq/L (ref 96–112)
Creatinine, Ser: 0.68 mg/dL (ref 0.40–1.20)
GFR: 85.03 mL/min (ref >60.00)
Glucose, Bld: 107 mg/dL — ABNORMAL HIGH (ref 70–99)
Potassium: 4 mEq/L (ref 3.5–5.1)
Sodium: 139 mEq/L (ref 135–145)

## 2020-01-05 LAB — CBC WITH DIFFERENTIAL/PLATELET
Basophils Absolute: 0.1 10*3/uL (ref 0.0–0.1)
Basophils Relative: 1.9 % (ref 0.0–3.0)
Eosinophils Absolute: 0.3 10*3/uL (ref 0.0–0.7)
Eosinophils Relative: 5.3 % — ABNORMAL HIGH (ref 0.0–5.0)
HCT: 38.7 % (ref 36.0–46.0)
Hemoglobin: 13.1 g/dL (ref 12.0–15.0)
Lymphocytes Relative: 30.7 % (ref 12.0–46.0)
Lymphs Abs: 1.5 10*3/uL (ref 0.7–4.0)
MCHC: 33.9 g/dL (ref 30.0–36.0)
MCV: 96.7 fl (ref 78.0–100.0)
Monocytes Absolute: 0.8 10*3/uL (ref 0.1–1.0)
Monocytes Relative: 16.1 % — ABNORMAL HIGH (ref 3.0–12.0)
Neutro Abs: 2.3 10*3/uL (ref 1.4–7.7)
Neutrophils Relative %: 46 % (ref 43.0–77.0)
Platelets: 236 10*3/uL (ref 150.0–400.0)
RBC: 4 Mil/uL (ref 3.87–5.11)
RDW: 13.2 % (ref 11.5–15.5)
WBC: 5 10*3/uL (ref 4.0–10.5)

## 2020-01-05 LAB — HEPATIC FUNCTION PANEL
ALT: 23 U/L (ref 0–35)
AST: 32 U/L (ref 0–37)
Albumin: 4 g/dL (ref 3.5–5.2)
Alkaline Phosphatase: 42 U/L (ref 39–117)
Bilirubin, Direct: 0.1 mg/dL (ref 0.0–0.3)
Total Bilirubin: 0.4 mg/dL (ref 0.2–1.2)
Total Protein: 6.5 g/dL (ref 6.0–8.3)

## 2020-01-06 ENCOUNTER — Other Ambulatory Visit: Payer: Medicare PPO

## 2020-01-06 ENCOUNTER — Other Ambulatory Visit: Payer: Self-pay | Admitting: Internal Medicine

## 2020-01-06 DIAGNOSIS — R319 Hematuria, unspecified: Secondary | ICD-10-CM

## 2020-01-06 NOTE — Progress Notes (Signed)
Order placed for add on urine culture ?

## 2020-01-07 LAB — URINE CULTURE
MICRO NUMBER:: 10742490
SPECIMEN QUALITY:: ADEQUATE

## 2020-01-09 ENCOUNTER — Ambulatory Visit: Payer: Medicare PPO | Admitting: Internal Medicine

## 2020-01-09 ENCOUNTER — Encounter: Payer: Self-pay | Admitting: Internal Medicine

## 2020-01-09 ENCOUNTER — Other Ambulatory Visit: Payer: Self-pay

## 2020-01-09 DIAGNOSIS — I10 Essential (primary) hypertension: Secondary | ICD-10-CM

## 2020-01-09 DIAGNOSIS — K219 Gastro-esophageal reflux disease without esophagitis: Secondary | ICD-10-CM | POA: Diagnosis not present

## 2020-01-09 DIAGNOSIS — G479 Sleep disorder, unspecified: Secondary | ICD-10-CM

## 2020-01-09 DIAGNOSIS — M810 Age-related osteoporosis without current pathological fracture: Secondary | ICD-10-CM | POA: Diagnosis not present

## 2020-01-09 DIAGNOSIS — E7800 Pure hypercholesterolemia, unspecified: Secondary | ICD-10-CM

## 2020-01-09 DIAGNOSIS — D649 Anemia, unspecified: Secondary | ICD-10-CM | POA: Diagnosis not present

## 2020-01-09 DIAGNOSIS — I700 Atherosclerosis of aorta: Secondary | ICD-10-CM

## 2020-01-09 DIAGNOSIS — E039 Hypothyroidism, unspecified: Secondary | ICD-10-CM | POA: Diagnosis not present

## 2020-01-09 DIAGNOSIS — D72819 Decreased white blood cell count, unspecified: Secondary | ICD-10-CM

## 2020-01-09 DIAGNOSIS — R319 Hematuria, unspecified: Secondary | ICD-10-CM

## 2020-01-09 MED ORDER — ROSUVASTATIN CALCIUM 5 MG PO TABS: ORAL_TABLET | ORAL | 1 refills | Status: DC

## 2020-01-09 NOTE — Progress Notes (Signed)
Patient ID: Savannah Cox, female   DOB: 1946-04-24, 74 y.o.   MRN: [id number]   Subjective:    Patient ID: Savannah Cox, female    DOB: 09/05/1945, 74 y.o.   MRN: [id number]  HPI This visit occurred during the SARS-CoV-2 public health emergency.  Safety protocols were in place, including screening questions prior to the visit, additional usage of staff PPE, and extensive cleaning of exam room while observing appropriate contact time as indicated for disinfecting solutions.  Patient here for a scheduled follow up.  She reports she is doing relatively well.  Feels good.  Staying active.  No chest pain or sob reported.  No abdominal pain. Bowels moving.  Seeing pulmonary - Dr Patsey Berthold.  Scheduled for PFTs.  Started on stioloto.  Recommended f/u in 2 months. Some issues with sleeping.  Discussed melatonin.     Past Medical History:  Diagnosis Date  . Allergic state   . Anemia   . Complication of anesthesia   . Diverticulosis   . GERD (gastroesophageal reflux disease)   . History of hiatal hernia   . History of kidney stones   . Hypercholesterolemia   . Hypertension   . Hypothyroidism   . Osteoporosis, postmenopausal   . PONV (postoperative nausea and vomiting)    nauseated with colonoscopy  . Shingles    Past Surgical History:  Procedure Laterality Date  . BREAST BIOPSY Left 90s   benign  . COLONOSCOPY WITH PROPOFOL N/A 01/28/2016   Procedure: COLONOSCOPY WITH PROPOFOL;  Surgeon: Lollie Sails, MD;  [facility] END[facility]COPY;  Service: Endoscopy;  Laterality: N/A;  . EXCISION METACARPAL MASS Right 04/30/2017   Procedure: EXCISION MUCOID CYST FIFTH FINGER RIGHT HAND;  Surgeon: Hessie Knows, MD;  Location: [facility] ORS;  Service: Orthopedics;  Laterality: Right;  . EYE SURGERY Bilateral 2010   cataracts   Family History  Problem Relation Age of Onset  . Heart disease Father        died age 41 - myocardial infarction  . Hypercholesterolemia Mother   . Arthritis Mother   .  Heart attack Mother   . Kidney cancer Other        aunt  . Breast cancer Maternal Aunt   . Colon cancer Neg Hx    Social History   Socioeconomic History  . Marital status: Married    Spouse name: Not on file  . Number of children: 3  . Years of education: 30  . Highest education level: Not on file  Occupational History  . Occupation: retired Product manager: RETIRED  Tobacco Use  . Smoking status: Former Smoker    Quit date: 1990    Years since quitting: 31.6  . Smokeless tobacco: Never Used  Vaping Use  . Vaping Use: Never used  Substance and Sexual Activity  . Alcohol use: Yes    Alcohol/week: 0.0 standard drinks    Comment: Rare drink, one drink per month  . Drug use: No  . Sexual activity: Yes  Other Topics Concern  . Not on file  Social History Narrative  . Not on file   Social Determinants of Health   Financial Resource Strain:   . Difficulty of Paying Living Expenses:   Food Insecurity:   . Worried About Charity fundraiser in the Last Year:   . Arboriculturist in the Last Year:   Transportation Needs:   . Film/video editor (Medical):   Marland Kitchen Lack of Transportation (  Non-Medical):   Physical Activity:   . Days of Exercise per Week:   . Minutes of Exercise per Session:   Stress:   . Feeling of Stress :   Social Connections:   . Frequency of Communication with Friends and Family:   . Frequency of Social Gatherings with Friends and Family:   . Attends Religious Services:   . Active Member of Clubs or Organizations:   . Attends Archivist Meetings:   Marland Kitchen Marital Status:     Outpatient Encounter Medications as of 01/09/2020  Medication Sig  . betamethasone dipropionate 0.05 % cream Apply topically 2 (two) times daily as needed.  Marland Kitchen lisinopril-hydrochlorothiazide (ZESTORETIC) 10-12.5 MG tablet TAKE 1 TABLET BY MOUTH EVERY DAY  . loratadine (CLARITIN) 10 MG tablet Take 10 mg by mouth daily.   . Multiple Vitamins-Minerals (CENTRUM SILVER ULTRA  WOMENS PO) Take 1 tablet by mouth daily.   Marland Kitchen omeprazole (PRILOSEC) 40 MG capsule TAKE 1 CAPSULE (40 MG TOTAL) BY MOUTH ONCE DAILY TAKE 30 MINS BEFORE A MEAL  . rosuvastatin (CRESTOR) 5 MG tablet Take 10mg  on Monday and Thursday and 5mg  all other days.  Marland Kitchen sertraline (ZOLOFT) 50 MG tablet TAKE 1 TABLET BY MOUTH EVERY DAY  . Tiotropium Bromide-Olodaterol (STIOLTO RESPIMAT) 2.5-2.5 MCG/ACT AERS Inhale 2 puffs into the lungs daily.  . [DISCONTINUED] levothyroxine (SYNTHROID) 75 MCG tablet TAKE 1 TABLET BY MOUTH EVERY DAY  . [DISCONTINUED] rosuvastatin (CRESTOR) 5 MG tablet TAKE 1 TABLET BY MOUTH EVERY DAY  . [DISCONTINUED] Tiotropium Bromide Monohydrate (SPIRIVA RESPIMAT) 2.5 MCG/ACT AERS Inhale 2 puffs into the lungs daily.   No facility-administered encounter medications on file as of 01/09/2020.    Review of Systems  Constitutional: Negative for appetite change and unexpected weight change.  HENT: Negative for congestion and sinus pressure.   Respiratory: Negative for cough, chest tightness and shortness of breath.   Cardiovascular: Negative for chest pain, palpitations and leg swelling.  Gastrointestinal: Negative for abdominal pain, diarrhea, nausea and vomiting.  Genitourinary: Negative for difficulty urinating and dysuria.  Musculoskeletal: Negative for joint swelling and myalgias.  Skin: Negative for color change and rash.  Neurological: Negative for dizziness, light-headedness and headaches.  Psychiatric/Behavioral: Negative for agitation and dysphoric mood.       Objective:    Physical Exam Vitals reviewed.  Constitutional:      General: She is not in acute distress.    Appearance: Normal appearance.  HENT:     Head: Normocephalic and atraumatic.     Right Ear: External ear normal.     Left Ear: External ear normal.  Eyes:     General: No scleral icterus.       Right eye: No discharge.        Left eye: No discharge.     Conjunctiva/sclera: Conjunctivae normal.  Neck:      Thyroid: No thyromegaly.  Cardiovascular:     Rate and Rhythm: Normal rate and regular rhythm.  Pulmonary:     Effort: No respiratory distress.     Breath sounds: Normal breath sounds. No wheezing.  Abdominal:     General: Bowel sounds are normal.     Palpations: Abdomen is soft.     Tenderness: There is no abdominal tenderness.  Musculoskeletal:        General: No swelling or tenderness.     Cervical back: Neck supple.  Lymphadenopathy:     Cervical: No cervical adenopathy.  Skin:    Findings: No erythema or rash.  Neurological:     Mental Status: She is alert.  Psychiatric:        Mood and Affect: Mood normal.        Behavior: Behavior normal.     BP (!) 118/62   Pulse 91   Temp 98.4 F (36.9 C)   Resp 16   Ht 5\' 3"  (1.6 m)   Wt 141 lb (64 kg)   SpO2 98%   BMI 24.98 kg/m  Wt Readings from Last 3 Encounters:  01/09/20 141 lb (64 kg)  12/13/19 142 lb 6.4 oz (64.6 kg)  10/27/19 140 lb (63.5 kg)     Lab Results  Component Value Date   WBC 5.0 01/05/2020   HGB 13.1 01/05/2020   HCT 38.7 01/05/2020   PLT 236.0 01/05/2020   GLUCOSE 107 (H) 01/05/2020   CHOL 187 01/05/2020   TRIG 77.0 01/05/2020   HDL 74.10 01/05/2020   LDLDIRECT 165.9 05/11/2013   LDLCALC 98 01/05/2020   ALT 23 01/05/2020   AST 32 01/05/2020   NA 139 01/05/2020   K 4.0 01/05/2020   CL 105 01/05/2020   CREATININE 0.68 01/05/2020   BUN 17 01/05/2020   CO2 28 01/05/2020   TSH 0.36 12/01/2018   INR 0.96 04/14/2016    MM 3D SCREEN BREAST BILATERAL  Result Date: 07/27/2019 CLINICAL DATA:  Screening. EXAM: DIGITAL SCREENING BILATERAL MAMMOGRAM WITH TOMO AND CAD COMPARISON:  Previous exam(s). ACR Breast Density Category c: The breast tissue is heterogeneously dense, which may obscure small masses. FINDINGS: There are no findings suspicious for malignancy. Images were processed with CAD. IMPRESSION: No mammographic evidence of malignancy. A result letter of this screening mammogram will be  mailed directly to the patient. RECOMMENDATION: Screening mammogram in one year. (Code:SM-B-01Y) BI-RADS CATEGORY  1: Negative. Electronically Signed   By: Curlene Dolphin M.D.   On: 07/27/2019 13:54       Assessment & Plan:   Problem List Items Addressed This Visit    Anemia    Follow cbc.  Had colonoscopy and EGD.       Aortic atherosclerosis ([place])    On crestor.        Relevant Medications   rosuvastatin (CRESTOR) 5 MG tablet   GERD (gastroesophageal reflux disease)    Controlled on omeprazole.  Sees GI.        Hematuria    Previously saw Dr Erlene Quan.  CT urogram 04/2014 - no evidence of GU pathology.        Relevant Orders   Ambulatory referral to Urology   Hypercholesterolemia    On crestor.  Low cholesterol diet and exercise.  Follow lipid panel and liver function tests.        Relevant Medications   rosuvastatin (CRESTOR) 5 MG tablet   Other Relevant Orders   Hepatic function panel   Lipid panel   Hypertension    Blood pressure on recheck 120/72.  Continue lisinopril/hctz.  Follow pressures.  Follow metabolic panel.        Relevant Medications   rosuvastatin (CRESTOR) 5 MG tablet   Other Relevant Orders   TSH   Basic metabolic panel   Hypothyroidism    On thyroid replacement.  Follow tsh.       Leukopenia    Follow cbc.       Osteoporosis    Reclast.  Follow.        Sleeping difficulties    Trial of melotonin.  Follow.  Ania Levay, MD 

## 2020-01-09 NOTE — Patient Instructions (Signed)
Melatonin 3mg  1-2 hours before bed.

## 2020-01-12 ENCOUNTER — Other Ambulatory Visit: Payer: Self-pay | Admitting: Internal Medicine

## 2020-01-16 DIAGNOSIS — M810 Age-related osteoporosis without current pathological fracture: Secondary | ICD-10-CM | POA: Diagnosis not present

## 2020-01-16 DIAGNOSIS — K219 Gastro-esophageal reflux disease without esophagitis: Secondary | ICD-10-CM | POA: Diagnosis not present

## 2020-01-16 DIAGNOSIS — E063 Autoimmune thyroiditis: Secondary | ICD-10-CM | POA: Diagnosis not present

## 2020-01-22 ENCOUNTER — Encounter: Payer: Self-pay | Admitting: Internal Medicine

## 2020-01-22 DIAGNOSIS — G479 Sleep disorder, unspecified: Secondary | ICD-10-CM | POA: Insufficient documentation

## 2020-01-22 NOTE — Assessment & Plan Note (Signed)
On crestor.   

## 2020-01-22 NOTE — Assessment & Plan Note (Signed)
Follow cbc.  

## 2020-01-22 NOTE — Assessment & Plan Note (Signed)
On thyroid replacement.  Follow tsh.  

## 2020-01-22 NOTE — Assessment & Plan Note (Signed)
Trial of melotonin.  Follow.

## 2020-01-22 NOTE — Assessment & Plan Note (Signed)
Controlled on omeprazole.  Sees GI.

## 2020-01-22 NOTE — Assessment & Plan Note (Signed)
Reclast.  Follow.

## 2020-01-22 NOTE — Assessment & Plan Note (Signed)
Blood pressure on recheck 120/72.  Continue lisinopril/hctz.  Follow pressures.  Follow metabolic panel.

## 2020-01-22 NOTE — Assessment & Plan Note (Signed)
On crestor.  Low cholesterol diet and exercise.  Follow lipid panel and liver function tests.   

## 2020-01-22 NOTE — Assessment & Plan Note (Signed)
Follow cbc.  Had colonoscopy and EGD.

## 2020-01-22 NOTE — Assessment & Plan Note (Signed)
Previously saw Dr Erlene Quan.  CT urogram 04/2014 - no evidence of GU pathology.

## 2020-01-24 ENCOUNTER — Telehealth: Payer: Self-pay

## 2020-01-24 NOTE — Telephone Encounter (Signed)
Noted.  Will close encounter.  

## 2020-01-24 NOTE — Telephone Encounter (Signed)
Pt called back. I relayed message below in regards to time, date and location of her covid test. Pt voiced understanding. Nothing further is needed.

## 2020-01-24 NOTE — Telephone Encounter (Signed)
Lm for patient to relay date/time of covid test prior to PFT.   [facility]

## 2020-01-24 NOTE — Progress Notes (Signed)
01/25/2020 9:08 AM   Patrick Jupiter 10/08/1945 [id number]  Referring provider: Einar Pheasant, [address] [place],  [place] [address] Chief Complaint  Patient presents with  . Hematuria    HPI: Savannah Cox is a 74 y.o. female with a personal history of microscopic hematuria who presents today for further evaluation of microscopic hematuria recurrence.  Previously saw patient for CT urogram 06/2013 with no evidence of GU pathology.  She believes she may have had a cystoscopy around this time as well but this documentation is unavailable to me.  She last saw her PCP for follow up on 01/09/2020. She was doing well but noted some trouble sleeping. She was staying active. She had no abdominal pain. Bowels were good. UA showed moderate blood, RBC/HPF 3-6, amorphous present. Urine culture was negative.   She reports stress incontinence when she exercises or is active outdoors. She feels like she can control her leakage. She wears 1 pad per day.   She has a personal history of kidney stones. She has nausea, vomiting and pain with each stone event  She is a former smoker. She used to smoke 1 ppd.   PMH: Past Medical History:  Diagnosis Date  . Allergic state   . Anemia   . Complication of anesthesia   . Diverticulosis   . GERD (gastroesophageal reflux disease)   . History of hiatal hernia   . History of kidney stones   . Hypercholesterolemia   . Hypertension   . Hypothyroidism   . Osteoporosis, postmenopausal   . PONV (postoperative nausea and vomiting)    nauseated with colonoscopy  . Shingles     Surgical History: Past Surgical History:  Procedure Laterality Date  . BREAST BIOPSY Left 90s   benign  . COLONOSCOPY WITH PROPOFOL N/A 01/28/2016   Procedure: COLONOSCOPY WITH PROPOFOL;  Surgeon: Lollie Sails, MD;  Location: [facility] ENDOSCOPY;  Service: Endoscopy;  Laterality: N/A;  . EXCISION METACARPAL MASS Right 04/30/2017   Procedure:  EXCISION MUCOID CYST FIFTH FINGER RIGHT HAND;  Surgeon: Hessie Knows, MD;  Location: [facility] ORS;  Service: Orthopedics;  Laterality: Right;  . EYE SURGERY Bilateral 2010   cataracts    Home Medications:  Allergies as of 01/25/2020      Reactions   Augmentin [amoxicillin-pot Clavulanate] Rash   Has patient had a PCN reaction causing immediate rash, facial/tongue/throat swelling, SOB or lightheadedness with hypotension: No Has patient had a PCN reaction causing severe rash involving mucus membranes or skin necrosis: No Has patient had a PCN reaction that required hospitalization: No Has patient had a PCN reaction occurring within the last 10 years: No If all of the above answers are "NO", then may proceed with Cephalosporin use.      Medication List       Accurate as of January 25, 2020  9:08 AM. If you have any questions, ask your nurse or doctor.        betamethasone dipropionate 0.05 % cream Apply topically 2 (two) times daily as needed.   CENTRUM SILVER ULTRA WOMENS PO Take 1 tablet by mouth daily.   levothyroxine 75 MCG tablet Commonly known as: SYNTHROID TAKE 1 TABLET BY MOUTH EVERY DAY   lisinopril-hydrochlorothiazide 10-12.5 MG tablet Commonly known as: ZESTORETIC TAKE 1 TABLET BY MOUTH EVERY DAY   loratadine 10 MG tablet Commonly known as: CLARITIN Take 10 mg by mouth daily.   omeprazole 40 MG capsule Commonly known as: PRILOSEC TAKE 1 CAPSULE (40  MG TOTAL) BY MOUTH ONCE DAILY TAKE 30 MINS BEFORE A MEAL   rosuvastatin 5 MG tablet Commonly known as: CRESTOR Take 10mg  on Monday and Thursday and 5mg  all other days.   sertraline 50 MG tablet Commonly known as: ZOLOFT TAKE 1 TABLET BY MOUTH EVERY DAY   Stiolto Respimat 2.5-2.5 MCG/ACT Aers Generic drug: Tiotropium Bromide-Olodaterol Inhale 2 puffs into the lungs daily.       Allergies:  Allergies  Allergen Reactions  . Augmentin [Amoxicillin-Pot Clavulanate] Rash    Has patient had a PCN reaction causing  immediate rash, facial/tongue/throat swelling, SOB or lightheadedness with hypotension: No Has patient had a PCN reaction causing severe rash involving mucus membranes or skin necrosis: No Has patient had a PCN reaction that required hospitalization: No Has patient had a PCN reaction occurring within the last 10 years: No If all of the above answers are "NO", then may proceed with Cephalosporin use.     Family History: Family History  Problem Relation Age of Onset  . Heart disease Father        died age 14 - myocardial infarction  . Hypercholesterolemia Mother   . Arthritis Mother   . Heart attack Mother   . Kidney cancer Other        aunt  . Breast cancer Maternal Aunt   . Colon cancer Neg Hx     Social History:  reports that she quit smoking about 31 years ago. She has never used smokeless tobacco. She reports current alcohol use. She reports that she does not use drugs.   Physical Exam: BP 98/62   Pulse 80   Ht 5\' 3"  (1.6 m)   Wt 140 lb (63.5 kg)   BMI 24.80 kg/m   Constitutional:  Alert and oriented, No acute distress. HEENT: [place] AT, moist mucus membranes.  Trachea midline, no masses. Cardiovascular: No clubbing, cyanosis, or edema. Respiratory: Normal respiratory effort, no increased work of breathing. Skin: No rashes, bruises or suspicious lesions. Neurologic: Grossly intact, no focal deficits, moving all 4 extremities. Psychiatric: Normal mood and affect.  Laboratory Data:  Lab Results  Component Value Date   CREATININE 0.68 01/05/2020    Urinalysis 3-10 RBCs   Assessment & Plan:    1. Microscopic hematuria UA shows 3-10 RBC otherwise unremarkable.  Patient is high risk given her history of smoking and age.  We discussed the differential diagnosis for microscopic hematuria including nephrolithiasis, renal or upper tract tumors, bladder stones, UTIs, or bladder tumors as well as undetermined etiologies. Per AUA guidelines, I did recommend complete  microscopic hematuria evaluation including CTU, possible urine cytology, and office cystoscopy.  Patient agreed.   2. Stress incontinence  Symptoms are mild in nature.  Encouraged patient to continue pelvic floor exercises, weight loss and other lifestyle changes.  Overall bother is minimal.    [facility] [address] [place] [address] [place], [place] [address] [phone]  I, Selena Batten, am acting as a scribe for Dr. Hollice Espy.  I have reviewed the above documentation for accuracy and completeness, and I agree with the above.   Hollice Espy, MD

## 2020-01-25 ENCOUNTER — Ambulatory Visit (INDEPENDENT_AMBULATORY_CARE_PROVIDER_SITE_OTHER): Payer: Medicare PPO | Admitting: Urology

## 2020-01-25 ENCOUNTER — Other Ambulatory Visit: Payer: Self-pay

## 2020-01-25 VITALS — BP 98/62 | HR 80 | Ht 63.0 in | Wt 140.0 lb

## 2020-01-25 DIAGNOSIS — R319 Hematuria, unspecified: Secondary | ICD-10-CM | POA: Diagnosis not present

## 2020-01-25 NOTE — Patient Instructions (Signed)
Cystoscopy Cystoscopy is a procedure that is used to help diagnose and sometimes treat conditions that affect the lower urinary tract. The lower urinary tract includes the bladder and the urethra. The urethra is the tube that drains urine from the bladder. Cystoscopy is done using a thin, tube-shaped instrument with a light and camera at the end (cystoscope). The cystoscope may be hard or flexible, depending on the goal of the procedure. The cystoscope is inserted through the urethra, into the bladder. Cystoscopy may be recommended if you have:  Urinary tract infections that keep coming back.  Blood in the urine (hematuria).  An inability to control when you urinate (urinary incontinence) or an overactive bladder.  Unusual cells found in a urine sample.  A blockage in the urethra, such as a urinary stone.  Painful urination.  An abnormality in the bladder found during an intravenous pyelogram (IVP) or CT scan. Cystoscopy may also be done to remove a sample of tissue to be examined under a microscope (biopsy). What are the risks? Generally, this is a safe procedure. However, problems may occur, including:  Infection.  Bleeding.  What happens during the procedure?  1. You will be given one or more of the following: ? A medicine to numb the area (local anesthetic). 2. The area around the opening of your urethra will be cleaned. 3. The cystoscope will be passed through your urethra into your bladder. 4. Germ-free (sterile) fluid will flow through the cystoscope to fill your bladder. The fluid will stretch your bladder so that your health care provider can clearly examine your bladder walls. 5. Your doctor will look at the urethra and bladder. 6. The cystoscope will be removed The procedure may vary among health care providers  What can I expect after the procedure? After the procedure, it is common to have: 1. Some soreness or pain in your abdomen and urethra. 2. Urinary symptoms.  These include: ? Mild pain or burning when you urinate. Pain should stop within a few minutes after you urinate. This may last for up to 1 week. ? A small amount of blood in your urine for several days. ? Feeling like you need to urinate but producing only a small amount of urine. Follow these instructions at home: General instructions  Return to your normal activities as told by your health care provider.   Do not drive for 24 hours if you were given a sedative during your procedure.  Watch for any blood in your urine. If the amount of blood in your urine increases, call your health care provider.  If a tissue sample was removed for testing (biopsy) during your procedure, it is up to you to get your test results. Ask your health care provider, or the department that is doing the test, when your results will be ready.  Drink enough fluid to keep your urine pale yellow.  Keep all follow-up visits as told by your health care provider. This is important. Contact a health care provider if you:  Have pain that gets worse or does not get better with medicine, especially pain when you urinate.  Have trouble urinating.  Have more blood in your urine. Get help right away if you:  Have blood clots in your urine.  Have abdominal pain.  Have a fever or chills.  Are unable to urinate. Summary  Cystoscopy is a procedure that is used to help diagnose and sometimes treat conditions that affect the lower urinary tract.  Cystoscopy is done using   a thin, tube-shaped instrument with a light and camera at the end.  After the procedure, it is common to have some soreness or pain in your abdomen and urethra.  Watch for any blood in your urine. If the amount of blood in your urine increases, call your health care provider.  If you were prescribed an antibiotic medicine, take it as told by your health care provider. Do not stop taking the antibiotic even if you start to feel better. This  information is not intended to replace advice given to you by your health care provider. Make sure you discuss any questions you have with your health care provider. Document Revised: 05/25/2018 Document Reviewed: 05/25/2018 Elsevier Patient Education  2020 [employer]   

## 2020-01-26 LAB — URINALYSIS, COMPLETE
Bilirubin, UA: NEGATIVE
Glucose, UA: NEGATIVE
Ketones, UA: NEGATIVE
Leukocytes,UA: NEGATIVE
Nitrite, UA: NEGATIVE
Protein,UA: NEGATIVE
Specific Gravity, UA: 1.015 (ref 1.005–1.030)
Urobilinogen, Ur: 0.2 mg/dL (ref 0.2–1.0)
pH, UA: 7.5 (ref 5.0–7.5)

## 2020-01-26 LAB — MICROSCOPIC EXAMINATION

## 2020-01-27 ENCOUNTER — Other Ambulatory Visit: Payer: Self-pay

## 2020-01-27 ENCOUNTER — Other Ambulatory Visit
Admission: RE | Admit: 2020-01-27 | Discharge: 2020-01-27 | Disposition: A | Discharge status: Home / Self Care | Payer: Medicare PPO | Source: Ambulatory Visit | Attending: Pulmonary Disease | Admitting: Pulmonary Disease

## 2020-01-27 DIAGNOSIS — Z20822 Contact with and (suspected) exposure to covid-19: Secondary | ICD-10-CM | POA: Diagnosis not present

## 2020-01-27 DIAGNOSIS — Z01812 Encounter for preprocedural laboratory examination: Secondary | ICD-10-CM | POA: Insufficient documentation

## 2020-01-27 LAB — SARS CORONAVIRUS 2 (TAT 6-24 HRS): SARS Coronavirus 2: NEGATIVE

## 2020-01-30 ENCOUNTER — Ambulatory Visit: Payer: Medicare PPO | Attending: Pulmonary Disease

## 2020-01-30 ENCOUNTER — Other Ambulatory Visit: Payer: Self-pay

## 2020-01-30 DIAGNOSIS — R0602 Shortness of breath: Secondary | ICD-10-CM | POA: Insufficient documentation

## 2020-01-30 LAB — PULMONARY FUNCTION TEST ARMC ONLY
DL/VA % pred: 74 %
DL/VA: 3.1 ml/min/mmHg/L
DLCO unc % pred: 68 %
DLCO unc: 12.77 ml/min/mmHg
FEF 25-75 Post: 3.3 L/sec
FEF 25-75 Pre: 2.48 L/sec
FEF2575-%Change-Post: 33 %
FEF2575-%Pred-Post: 188 %
FEF2575-%Pred-Pre: 141 %
FEV1-%Change-Post: 7 %
FEV1-%Pred-Post: 101 %
FEV1-%Pred-Pre: 94 %
FEV1-Post: 2.15 L
FEV1-Pre: 1.99 L
FEV1FVC-%Change-Post: 0 %
FEV1FVC-%Pred-Pre: 110 %
FEV6-%Change-Post: 6 %
FEV6-%Pred-Post: 95 %
FEV6-%Pred-Pre: 89 %
FEV6-Post: 2.56 L
FEV6-Pre: 2.4 L
FEV6FVC-%Pred-Post: 104 %
FEV6FVC-%Pred-Pre: 104 %
FVC-%Change-Post: 6 %
FVC-%Pred-Post: 91 %
FVC-%Pred-Pre: 85 %
FVC-Post: 2.56 L
Post FEV1/FVC ratio: 84 %
Post FEV6/FVC ratio: 100 %
Pre FEV1/FVC ratio: 83 %
Pre FEV6/FVC Ratio: 100 %
RV % pred: 69 %
RV: 1.52 L
TLC % pred: 83 %
TLC: 4.11 L

## 2020-01-30 MED ORDER — ALBUTEROL SULFATE (2.5 MG/3ML) 0.083% IN NEBU
2.5000 mg | INHALATION_SOLUTION | Freq: Once | RESPIRATORY_TRACT | Status: AC
  Administered 2020-01-30: 2.5 mg via RESPIRATORY_TRACT
  Filled 2020-01-30: qty 3

## 2020-02-10 ENCOUNTER — Ambulatory Visit
Admission: RE | Admit: 2020-02-10 | Discharge: 2020-02-10 | Disposition: A | Discharge status: Home / Self Care | Payer: Medicare PPO | Source: Ambulatory Visit | Attending: Urology | Admitting: Urology

## 2020-02-10 ENCOUNTER — Other Ambulatory Visit: Payer: Self-pay

## 2020-02-10 DIAGNOSIS — K429 Umbilical hernia without obstruction or gangrene: Secondary | ICD-10-CM | POA: Diagnosis not present

## 2020-02-10 DIAGNOSIS — N281 Cyst of kidney, acquired: Secondary | ICD-10-CM | POA: Diagnosis not present

## 2020-02-10 DIAGNOSIS — R319 Hematuria, unspecified: Secondary | ICD-10-CM | POA: Insufficient documentation

## 2020-02-10 DIAGNOSIS — K5730 Diverticulosis of large intestine without perforation or abscess without bleeding: Secondary | ICD-10-CM | POA: Diagnosis not present

## 2020-02-10 DIAGNOSIS — N8110 Cystocele, unspecified: Secondary | ICD-10-CM | POA: Diagnosis not present

## 2020-02-10 LAB — POCT I-STAT CREATININE: Creatinine, Ser: 0.9 mg/dL (ref 0.44–1.00)

## 2020-02-10 MED ORDER — IOHEXOL 300 MG/ML  SOLN
125.0000 mL | Freq: Once | INTRAMUSCULAR | Status: AC | PRN
  Administered 2020-02-10: 125 mL via INTRAVENOUS

## 2020-02-13 ENCOUNTER — Encounter: Payer: Self-pay | Admitting: Pulmonary Disease

## 2020-02-13 ENCOUNTER — Ambulatory Visit: Payer: Medicare PPO | Admitting: Pulmonary Disease

## 2020-02-13 ENCOUNTER — Other Ambulatory Visit: Payer: Self-pay

## 2020-02-13 VITALS — BP 104/60 | HR 68 | Temp 98.2°F | Ht 63.0 in | Wt 141.0 lb

## 2020-02-13 DIAGNOSIS — Z0000 Encounter for general adult medical examination without abnormal findings: Secondary | ICD-10-CM

## 2020-02-13 DIAGNOSIS — J432 Centrilobular emphysema: Secondary | ICD-10-CM | POA: Diagnosis not present

## 2020-02-13 DIAGNOSIS — Z87891 Personal history of nicotine dependence: Secondary | ICD-10-CM | POA: Diagnosis not present

## 2020-02-13 MED ORDER — TRELEGY ELLIPTA 100-62.5-25 MCG/INH IN AEPB: 1.0000 | INHALATION_SPRAY | Freq: Every day | RESPIRATORY_TRACT | 0 refills | Status: DC

## 2020-02-13 NOTE — Assessment & Plan Note (Signed)
Recent stable chest x-ray in December/2020  Plan: Continue to not smoke Could consider repeat chest x-ray at next office visit Does not qualify for lung cancer screening as patient quit smoking in 1990

## 2020-02-13 NOTE — Progress Notes (Addendum)
   02/15/2020  CC:  Chief Complaint  Patient presents with  . Cysto    HPI: Savannah Cox is a 74 y.o. female who returns for a cystoscopy and review of CT hematuria work up.   Previously saw patient for CT urogram 06/2013 with no evidence of GU pathology.  She believes she may have had a cystoscopy around this time as well but this documentation is unavailable to me.  She last saw her PCP for follow up on 01/09/2020. She was doing well but noted some trouble sleeping. She was staying active. She had no abdominal pain. Bowels were good. UA showed moderate blood, RBC/HPF 3-6, amorphous present. Urine culture was negative.   CT hematuria work up on 02/10/2020 revealed mild diffuse urothelial enhancement particularly on the LEFT may be due to urinary tract infection or transient reflux or obstruction in the setting of marked pelvic floor dysfunction with funneling of the bladder base through the anterior compartment of the pelvic floor hiatus. No upper tract lesion seen on excretory phase imaging. Moderately large cystocele presumably without provocation or straining, may worsen with straining. Correlate with urinary symptoms. Bilateral renal cysts.Colonic diverticulosis without evidence of acute diverticulitis. Small fat containing umbilical hernia.  She has a personal history of kidney stones. She has nausea, vomiting and pain with each stone event  She is a former smoker. She used to smoke 1 ppd.    Blood pressure 112/73, pulse 82. NED. A&Ox3.   No respiratory distress   Abd soft, NT, ND Normal external genitalia with patent urethral meatus  Cystoscopy Procedure Note  Patient identification was confirmed, informed consent was obtained, and patient was prepped using Betadine solution.  Lidocaine jelly was administered per urethral meatus.    Procedure: - Flexible cystoscope introduced, without any difficulty.   - Thorough search of the bladder revealed:    normal urethral  meatus    normal urothelium    no stones    no ulcers     no tumors    no urethral polyps    Mild trabeculation  - Ureteral orifices were normal in position and appearance.  -Posterior and lateral decent of the bladder appreciated.    Post-Procedure: - Patient tolerated the procedure well    Assessment/ Plan:  1. Microscopic hematuria Cystoscopy was negative.  CT urogram also reviewed, no pathology other than #1  2. Pelvic floor prolapse Patient has notable cystocele and does not wish to seek further treatment at this time.    Follow up in 2 years with UA   I, Selena Batten, am acting as a scribe for Dr. Hollice Espy.  I have reviewed the above documentation for accuracy and completeness, and I agree with the above.   Hollice Espy, MD

## 2020-02-13 NOTE — Progress Notes (Signed)
@Patient  ID: Savannah Cox, female    DOB: 1945/09/23, 74 y.o.   MRN: [id number]  Chief Complaint  Patient presents with   Follow-up    Breathing overall doing well, feels like stiolto works about the same as spiriva. She does not have a rescue inhaler.     Referring provider: Einar Pheasant, MD  HPI:  74 year old female former smoker followed in our office for suspected COPD  PMH: GERD, hypothyroidism, hypertension, osteoporosis Smoker/ Smoking History: Former smoker Maintenance: Stiolto Respimat Pt of: Dr. Patsey Berthold  02/13/2020  - Visit   74 year old female former smoker followed in our office for suspected COPD.  Patient was last seen in our office in June/2021 by Dr. Patsey Berthold.  Plan of care from that visit was to order pulmonary function testing.  Patient was trialed on Stiolto.  And to follow-up in 2 months.  Patient presenting to office today reporting she has not noticed much improvement since starting Stiolto.  She is unsure if this is actually helping.  She stopped using the inhaler over a week ago.  She would like to review the pulmonary function testing results that she completed earlier this month.  mMRC 0 today.  Questionaires / Pulmonary Flowsheets:   ACT:  No flowsheet data found.  MMRC: mMRC Dyspnea Scale mMRC Score  02/13/2020 0    Epworth:  No flowsheet data found.  Tests:   01/05/2020-eosinophils relative 5.3, eosinophils absolute 0.3  06/07/2019-chest x-ray-bronchitic changes and mild hyperinflation question COPD, no acute infiltrate  01/30/2020-pulmonary function test-FVC 2.4 (85% predicted), postbronchodilator ratio 84, postbronchodilator FEV1 2.15 (101% predicted), no bronchodilator response, DLCO 12.77 (68% predicted)  FENO:  No results found for: NITRICOXIDE  PFT: No flowsheet data found.  WALK:  SIX MIN WALK 12/13/2019  Supplimental Oxygen during Test? (L/min) No  Tech Comments: Patient completd 3 laps at fast pace without any rest  breaks. Patient had no complaints at end of walk.    Imaging: Pulmonary Function Test [facility] Only  Result Date: 01/31/2020 Spirometry Data Is Acceptable and Reproducible No obvious evidence of Obstructive Airways disease or Restrictive Lung disease Consider outpatient follow up with Pulmonary if needed. Clinical Correlation Advised   CT HEMATURIA WORKUP  Result Date: 02/10/2020 CLINICAL DATA:  Hematuria EXAM: CT ABDOMEN AND PELVIS WITHOUT AND WITH CONTRAST TECHNIQUE: Multidetector CT imaging of the abdomen and pelvis was performed following the standard protocol before and following the bolus administration of intravenous contrast. CONTRAST:  119mL OMNIPAQUE IOHEXOL 300 MG/ML  SOLN COMPARISON:  April 19, 2014, also exams from 2017 and 2018. FINDINGS: Lower chest: No consolidation.  No pleural effusion. Hepatobiliary: Suspect background hepatic steatosis. No focal, suspicious hepatic lesion. No pericholecystic stranding. Portal vein is patent in the liver. Pancreas: Pancreas without focal lesion, ductal dilation or inflammation. Spleen: Spleen normal size and contour. Adrenals/Urinary Tract: Adrenal glands are normal. No nephrolithiasis. Symmetric renal enhancement. Small cysts in the bilateral kidneys largest on the RIGHT in the upper pole 13 mm and on the LEFT in the interpolar LEFT kidney also approximately 13 mm. No hydronephrosis. 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 Funneling of the ureter through the hiatus at the pelvic floor along with the bladder base. Descent of the bladder base below the pelvic floor compatible with cystocele, approximately 2.5 cm below the pubococcygeal line. Funneling of the ureter into the anterior compartment pelvic floor descent. Bifid LEFT renal pelvis may explain some of the distortion of the renal pelvic anatomy  seen on the venous phase assessment. There is no filling defect in the upper tracts. Stomach/Bowel: No  acute gastrointestinal process. The appendix is normal. Stool fills much of the colon. Colonic diverticulosis. Vascular/Lymphatic: Calcified atheromatous plaque in the abdominal aorta without aneurysm. Moderate calcification. Retroaortic LEFT renal vein. No adenopathy in the upper abdomen or in the retroperitoneum. No pelvic lymphadenopathy. Reproductive: Atrophic uterus. No pelvic mass. Pelvic floor dysfunction as described. Other: Small fat containing umbilical hernia. Musculoskeletal: No acute musculoskeletal process. No destructive bone finding. IMPRESSION: 1. Mild diffuse urothelial enhancement particularly on the LEFT may be due to urinary tract infection or transient reflux or obstruction in the setting of marked pelvic floor dysfunction with funneling of the bladder base through the anterior compartment of the pelvic floor hiatus. Uro-gynecologic consultation may be helpful. Consider follow-up urinalysis with further imaging as warranted. No upper tract lesion seen on excretory phase imaging. 2. Moderately large cystocele presumably without provocation or straining, may worsen with straining. Correlate with urinary symptoms. 3. Bilateral renal cysts. 4. Colonic diverticulosis without evidence of acute diverticulitis. 5. Aortic atherosclerosis. 6. Small fat containing umbilical hernia. Aortic Atherosclerosis (ICD10-I70.0). Electronically Signed   By: Zetta Bills M.D.   On: 02/10/2020 16:37    Lab Results:  CBC    Component Value Date/Time   WBC 5.0 01/05/2020 0801   RBC 4.00 01/05/2020 0801   HGB 13.1 01/05/2020 0801   HGB 12.6 04/14/2012 1403   HCT 38.7 01/05/2020 0801   PLT 236.0 01/05/2020 0801   MCV 96.7 01/05/2020 0801   MCH 32.4 04/20/2017 1001   MCHC 33.9 01/05/2020 0801   RDW 13.2 01/05/2020 0801   LYMPHSABS 1.5 01/05/2020 0801   MONOABS 0.8 01/05/2020 0801   EOSABS 0.3 01/05/2020 0801   BASOSABS 0.1 01/05/2020 0801    BMET    Component Value Date/Time   NA 139 01/05/2020  0801   K 4.0 01/05/2020 0801   K 3.2 (L) 04/14/2012 1403   CL 105 01/05/2020 0801   CO2 28 01/05/2020 0801   GLUCOSE 107 (H) 01/05/2020 0801   BUN 17 01/05/2020 0801   CREATININE 0.90 02/10/2020 1528   CALCIUM 9.3 01/05/2020 0801   GFRNONAA >60 04/15/2016 0607   GFRAA >60 04/15/2016 0607    BNP No results found for: BNP  ProBNP No results found for: PROBNP  Specialty Problems      Pulmonary Problems   SOB (shortness of breath)   Centrilobular emphysema (HCC)      Allergies  Allergen Reactions   Augmentin [Amoxicillin-Pot Clavulanate] Rash    Has patient had a PCN reaction causing immediate rash, facial/tongue/throat swelling, SOB or lightheadedness with hypotension: No Has patient had a PCN reaction causing severe rash involving mucus membranes or skin necrosis: No Has patient had a PCN reaction that required hospitalization: No Has patient had a PCN reaction occurring within the last 10 years: No If all of the above answers are "NO", then may proceed with Cephalosporin use.     Immunization History  Administered Date(s) Administered   DTaP 12/09/2010   Influenza Split 04/29/2012   Influenza, High Dose Seasonal PF 02/19/2016, 03/19/2017, 06/03/2018   Influenza,inj,Quad PF,6+ Mos 02/21/2013, 02/13/2014, 05/14/2015   Influenza-Unspecified 02/16/2019   PFIZER SARS-COV-2 Vaccination 07/31/2019, 08/23/2019   Pneumococcal Conjugate-13 04/28/2013   Pneumococcal Polysaccharide-23 02/13/2014   Zoster 10/12/2011   Zoster Recombinat (Shingrix) 02/01/2019, 05/16/2019    Past Medical History:  Diagnosis Date   Allergic state    Anemia  Complication of anesthesia    Diverticulosis    GERD (gastroesophageal reflux disease)    History of hiatal hernia    History of kidney stones    Hypercholesterolemia    Hypertension    Hypothyroidism    Osteoporosis, postmenopausal    PONV (postoperative nausea and vomiting)    nauseated with colonoscopy    Shingles     Tobacco History: Social History   Tobacco Use  Smoking Status Former Smoker   Packs/day: 1.50   Years: 30.00   Pack years: 45.00   Types: Cigarettes   Quit date: 1990   Years since quitting: 31.6  Smokeless Tobacco Never Used   Counseling given: Not Answered   Continue to not smoke  Outpatient Encounter Medications as of 02/13/2020  Medication Sig   betamethasone dipropionate 0.05 % cream Apply topically 2 (two) times daily as needed.   levothyroxine (SYNTHROID) 75 MCG tablet TAKE 1 TABLET BY MOUTH EVERY DAY   lisinopril-hydrochlorothiazide (ZESTORETIC) 10-12.5 MG tablet TAKE 1 TABLET BY MOUTH EVERY DAY   loratadine (CLARITIN) 10 MG tablet Take 10 mg by mouth daily.    Multiple Vitamins-Minerals (CENTRUM SILVER ULTRA WOMENS PO) Take 1 tablet by mouth daily.    omeprazole (PRILOSEC) 40 MG capsule TAKE 1 CAPSULE (40 MG TOTAL) BY MOUTH ONCE DAILY TAKE 30 MINS BEFORE A MEAL   rosuvastatin (CRESTOR) 5 MG tablet Take 10mg  on Monday and Thursday and 5mg  all other days.   sertraline (ZOLOFT) 50 MG tablet TAKE 1 TABLET BY MOUTH EVERY DAY   Tiotropium Bromide-Olodaterol (STIOLTO RESPIMAT) 2.5-2.5 MCG/ACT AERS Inhale 2 puffs into the lungs daily.   Fluticasone-Umeclidin-Vilant (TRELEGY ELLIPTA) 100-62.5-25 MCG/INH AEPB Inhale 1 puff into the lungs daily.   No facility-administered encounter medications on file as of 02/13/2020.     Review of Systems  Review of Systems  Constitutional: Negative for activity change, fatigue and fever.  HENT: Positive for congestion (little - white clear - baseline). Negative for sinus pressure, sinus pain and sore throat.   Respiratory: Positive for cough. Negative for shortness of breath and wheezing.   Cardiovascular: Negative for chest pain and palpitations.  Gastrointestinal: Negative for diarrhea, nausea and vomiting.  Musculoskeletal: Negative for arthralgias.  Neurological: Negative for dizziness.   Psychiatric/Behavioral: Negative for sleep disturbance. The patient is not nervous/anxious.      Physical Exam  BP 104/60 (BP Location: Left Arm, Cuff Size: Normal)    Pulse 68    Temp 98.2 F (36.8 C) (Temporal)    Ht 5\' 3"  (1.6 m)    Wt 141 lb (64 kg)    SpO2 97% Comment: on RA   BMI 24.98 kg/m   Wt Readings from Last 5 Encounters:  02/13/20 141 lb (64 kg)  01/25/20 140 lb (63.5 kg)  01/09/20 141 lb (64 kg)  12/13/19 142 lb 6.4 oz (64.6 kg)  10/27/19 140 lb (63.5 kg)    BMI Readings from Last 5 Encounters:  02/13/20 24.98 kg/m  01/25/20 24.80 kg/m  01/09/20 24.98 kg/m  12/13/19 25.23 kg/m  10/27/19 24.80 kg/m     Physical Exam Vitals and nursing note reviewed.  Constitutional:      General: She is not in acute distress.    Appearance: Normal appearance. She is normal weight.  HENT:     Head: Normocephalic and atraumatic.     Right Ear: External ear normal.     Left Ear: External ear normal.     Nose: Nose normal. No congestion.  Mouth/Throat:     Mouth: Mucous membranes are moist.     Pharynx: Oropharynx is clear.  Eyes:     Pupils: Pupils are equal, round, and reactive to light.  Cardiovascular:     Rate and Rhythm: Normal rate and regular rhythm.     Pulses: Normal pulses.     Heart sounds: Normal heart sounds. No murmur heard.   Pulmonary:     Effort: No respiratory distress.     Breath sounds: No decreased air movement. No decreased breath sounds, wheezing or rales.  Musculoskeletal:     Cervical back: Normal range of motion.  Skin:    General: Skin is warm and dry.     Capillary Refill: Capillary refill takes less than 2 seconds.  Neurological:     General: No focal deficit present.     Mental Status: She is alert and oriented to person, place, and time. Mental status is at baseline.     Gait: Gait normal.  Psychiatric:        Mood and Affect: Mood normal.        Behavior: Behavior normal.        Thought Content: Thought content normal.         Judgment: Judgment normal.       Assessment & Plan:   Centrilobular emphysema ([place]) Former smoker History of peripheral eosinophilia No clinical improvement with LAMA/LABA agent Reviewed recent pulmonary function test which shows no formal obstruction, moderate diffusion defect, likely consistent with emphysema Hyperinflation on December/2020 chest x-ray  Plan: Trial of Trelegy Ellipta Can consider repeat chest x-ray at next office visit Follow-up in 6 to 9 months with Dr. Patsey Berthold  Health care maintenance Patient is already received her COVID-19 vaccines   Plan: To recommended the patient obtain her seasonal flu vaccine in fall/2021  Former smoker Recent stable chest x-ray in December/2020  Plan: Continue to not smoke Could consider repeat chest x-ray at next office visit Does not qualify for lung cancer screening as patient quit smoking in [place]    Return in about 9 months (around 11/12/2020), or if symptoms worsen or fail to improve, for [facility] - Dr. Patsey Berthold.   Lauraine Rinne, NP 02/13/2020   This appointment required 25 minutes of patient care (this includes precharting, chart review, review of results, face-to-face care, etc.).

## 2020-02-13 NOTE — Assessment & Plan Note (Signed)
Patient is already received her COVID-19 vaccines   Plan: To recommended the patient obtain her seasonal flu vaccine in fall/2021

## 2020-02-13 NOTE — Assessment & Plan Note (Addendum)
Former smoker History of peripheral eosinophilia No clinical improvement with LAMA/LABA agent Reviewed recent pulmonary function test which shows no formal obstruction, moderate diffusion defect, likely consistent with emphysema Hyperinflation on December/2020 chest x-ray  Plan: Trial of Trelegy Ellipta Can consider repeat chest x-ray at next office visit Follow-up in 6 to 9 months with Dr. Patsey Berthold

## 2020-02-13 NOTE — Patient Instructions (Addendum)
You were seen today by Lauraine Rinne, NP  for:   1. Centrilobular emphysema (HCC)  Trial of Trelegy Ellipta  >>> 1 puff daily in the morning >>>rinse mouth out after use  >>> This inhaler contains 3 medications that help manage her respiratory status, contact our office if you cannot afford this medication or unable to remain on this medication  Call our office or send [place] a MyChart message in 2 weeks and let us know how you are doing after finishing this inhaler.  If you have noticed a clinical difference and we can send in a prescription  If you have not noticed any difference at all then we can stop maintenance inhalers at this time as they have not shown clinical improvement in you  Note your daily symptoms > remember "red flags" for COPD:   >>>Increase in cough >>>increase in sputum production >>>increase in shortness of breath or activity  intolerance.   If you notice these symptoms, please call the office to be seen.   Continue silver sneakers  2. Former smoker  Continue to not smoke  Can consider repeat x-ray at next office visit  3. Health care maintenance  Great job obtaining your COVID-19 vaccines  We recommend the seasonal flu vaccine in fall/2021   We recommend today:   Meds ordered this encounter  Medications  . Fluticasone-Umeclidin-Vilant (TRELEGY ELLIPTA) 100-62.5-25 MCG/INH AEPB    Sig: Inhale 1 puff into the lungs daily.    Dispense:  14 each    Refill:  0    Order Specific Question:   Lot Number?    Answer:   [id number]    Order Specific Question:   Expiration Date?    Answer:   08/14/2021    Order Specific Question:   Quantity    Answer:   1    Follow Up:    Return in about 9 months (around 11/12/2020), or if symptoms worsen or fail to improve, for [facility] - Dr. Patsey Berthold.   Please do your part to reduce the spread of COVID-19:      Reduce your risk of any infection  and COVID19 by using the similar precautions used for avoiding the  common cold or flu:  Marland Kitchen Wash your hands often with soap and warm water for at least 20 seconds.  If soap and water are not readily available, use an alcohol-based hand sanitizer with at least 60% alcohol.  . If coughing or sneezing, cover your mouth and nose by coughing or sneezing into the elbow areas of your shirt or coat, into a tissue or into your sleeve (not your hands). Langley Gauss A MASK when in public  . Avoid shaking hands with others and consider head nods or verbal greetings only. . Avoid touching your eyes, nose, or mouth with unwashed hands.  . Avoid close contact with people who are sick. . Avoid places or events with large numbers of people in one location, like concerts or sporting events. . If you have some symptoms but not all symptoms, continue to monitor at home and seek medical attention if your symptoms worsen. . If you are having a medical emergency, call 911.   [place] / e-Visit: [email or web address]         [facility]: [facility]: [phone]                   [facility]: [phone]     It is flu season:   >>> Best ways to protect herself from the flu: Receive the yearly flu vaccine, practice good hand hygiene washing with soap and also using hand sanitizer when available, eat a nutritious meals, get adequate rest, hydrate appropriately   Please contact the office if your symptoms worsen or you have concerns that you are not improving.   Thank you for choosing [facility] for your healthcare, and for allowing [place] to partner with you on your healthcare journey. I am thankful to be able to provide care to you today.   Wyn Quaker FNP-C    COPD and Physical Activity Chronic obstructive pulmonary disease (COPD) is a long-term (chronic) condition that affects the lungs. COPD is a general term that can  be used to describe many different lung problems that cause lung swelling (inflammation) and limit airflow, including chronic bronchitis and emphysema. The main symptom of COPD is shortness of breath, which makes it harder to do even simple tasks. This can also make it harder to exercise and be active. Talk with your health care provider about treatments to help you breathe better and actions you can take to prevent breathing problems during physical activity. What are the benefits of exercising with COPD? Exercising regularly is an important part of a healthy lifestyle. You can still exercise and do physical activities even though you have COPD. Exercise and physical activity improve your shortness of breath by increasing blood flow (circulation). This causes your heart to pump more oxygen through your body. Moderate exercise can improve your:  Oxygen use.  Energy level.  Shortness of breath.  Strength in your breathing muscles.  Heart health.  Sleep.  Self-esteem and feelings of self-worth.  Depression, stress, and anxiety levels. Exercise can benefit everyone with COPD. The severity of your disease may affect how hard you can exercise, especially at first, but everyone can benefit. Talk with your health care provider about how much exercise is safe for you, and which activities and exercises are safe for you. What actions can I take to prevent breathing problems during physical activity?  Sign up for a pulmonary rehabilitation program. This type of program may include: ? Education about lung diseases. ? Exercise classes that teach you how to exercise and be more active while improving your breathing. This usually involves:  Exercise using your lower extremities, such as a stationary bicycle.  About 30 minutes of exercise, 2 to 5 times per week, for 6 to 12 weeks  Strength training, such as push ups or leg lifts. ? Nutrition education. ? Group classes in which you can talk with  others who also have COPD and learn ways to manage stress.  If you use an oxygen tank, you should use it while you exercise. Work with your health care provider to adjust your oxygen for your physical activity. Your resting flow rate is different from your flow rate during physical activity.  While you are exercising: ? Take slow breaths. ? Pace yourself and do not try to go too fast. ? Purse your lips while breathing out. Pursing your lips is similar to a kissing or whistling position. ? If doing exercise that uses a quick burst of effort, such as weight lifting:  Breathe in before starting the exercise.  Breathe out during the hardest part of the exercise (such as raising the weights). Where to find support You can find support for exercising with COPD from:  Your health  care provider.  A pulmonary rehabilitation program.  Your local health department or community health programs.  Support groups, online or in-person. Your health care provider may be able to recommend support groups. Where to find more information You can find more information about exercising with COPD from:  American Lung Association: ClassInsider.se.  COPD Foundation: https://www.rivera.net/. Contact a health care provider if:  Your symptoms get worse.  You have chest pain.  You have nausea.  You have a fever.  You have trouble talking or catching your breath.  You want to start a new exercise program or a new activity. Summary  COPD is a general term that can be used to describe many different lung problems that cause lung swelling (inflammation) and limit airflow. This includes chronic bronchitis and emphysema.  Exercise and physical activity improve your shortness of breath by increasing blood flow (circulation). This causes your heart to provide more oxygen to your body.  Contact your health care provider before starting any exercise program or new activity. Ask your health care provider what exercises  and activities are safe for you. This information is not intended to replace advice given to you by your health care provider. Make sure you discuss any questions you have with your health care provider. Document Revised: 09/22/2018 Document Reviewed: 06/25/2017 Elsevier Patient Education  2020 [employer].

## 2020-02-15 ENCOUNTER — Ambulatory Visit: Payer: Medicare PPO | Admitting: Urology

## 2020-02-15 ENCOUNTER — Other Ambulatory Visit: Payer: Self-pay

## 2020-02-15 VITALS — BP 112/73 | HR 82

## 2020-02-15 DIAGNOSIS — R319 Hematuria, unspecified: Secondary | ICD-10-CM | POA: Diagnosis not present

## 2020-02-16 LAB — URINALYSIS, COMPLETE
Bilirubin, UA: NEGATIVE
Glucose, UA: NEGATIVE
Ketones, UA: NEGATIVE
Leukocytes,UA: NEGATIVE
Nitrite, UA: NEGATIVE
Protein,UA: NEGATIVE
Specific Gravity, UA: 1.015 (ref 1.005–1.030)
Urobilinogen, Ur: 0.2 mg/dL (ref 0.2–1.0)
pH, UA: 7 (ref 5.0–7.5)

## 2020-02-16 LAB — MICROSCOPIC EXAMINATION: Bacteria, UA: NONE SEEN

## 2020-02-19 ENCOUNTER — Other Ambulatory Visit: Payer: Self-pay | Admitting: Internal Medicine

## 2020-02-21 ENCOUNTER — Telehealth: Payer: Self-pay | Admitting: Pulmonary Disease

## 2020-02-21 MED ORDER — TRELEGY ELLIPTA 100-62.5-25 MCG/INH IN AEPB: 1.0000 | INHALATION_SPRAY | Freq: Every day | RESPIRATORY_TRACT | 11 refills | Status: DC

## 2020-02-21 NOTE — Telephone Encounter (Signed)
Called and spoke to patient, who is requesting a Rx for Trelegy, as she feels that this medication is effective.  Rx for Trelegy 100 has been sent to preferred pharmacy. Nothing further is needed at this time.

## 2020-02-23 ENCOUNTER — Other Ambulatory Visit: Payer: Self-pay | Admitting: Pulmonary Disease

## 2020-02-23 DIAGNOSIS — M810 Age-related osteoporosis without current pathological fracture: Secondary | ICD-10-CM | POA: Diagnosis not present

## 2020-02-23 MED ORDER — TRELEGY ELLIPTA 100-62.5-25 MCG/INH IN AEPB: 1.0000 | INHALATION_SPRAY | Freq: Every day | RESPIRATORY_TRACT | 11 refills | Status: DC

## 2020-02-23 NOTE — Telephone Encounter (Signed)
Called and spoke with patient to let her know that RX was sent in as sample and not normal that why they did not receive it. RX resent to preferred pharmacy. Nothing further needed at this time.

## 2020-02-29 DIAGNOSIS — H0011 Chalazion right upper eyelid: Secondary | ICD-10-CM | POA: Diagnosis not present

## 2020-03-02 NOTE — Progress Notes (Signed)
Agree with the details of the visit as noted by Brian Mack, NP.  C. Laura Serafino Burciaga, MD [facility] 

## 2020-03-07 DIAGNOSIS — E063 Autoimmune thyroiditis: Secondary | ICD-10-CM | POA: Diagnosis not present

## 2020-03-17 ENCOUNTER — Other Ambulatory Visit: Payer: Self-pay | Admitting: Internal Medicine

## 2020-03-28 ENCOUNTER — Encounter: Payer: Self-pay | Admitting: Dermatology

## 2020-03-28 ENCOUNTER — Ambulatory Visit: Payer: Medicare PPO | Admitting: Dermatology

## 2020-03-28 ENCOUNTER — Other Ambulatory Visit: Payer: Self-pay

## 2020-03-28 DIAGNOSIS — D485 Neoplasm of uncertain behavior of skin: Secondary | ICD-10-CM

## 2020-03-28 DIAGNOSIS — Z1283 Encounter for screening for malignant neoplasm of skin: Secondary | ICD-10-CM | POA: Diagnosis not present

## 2020-03-28 DIAGNOSIS — D229 Melanocytic nevi, unspecified: Secondary | ICD-10-CM | POA: Diagnosis not present

## 2020-03-28 DIAGNOSIS — D1800 Hemangioma unspecified site: Secondary | ICD-10-CM | POA: Diagnosis not present

## 2020-03-28 DIAGNOSIS — D489 Neoplasm of uncertain behavior, unspecified: Secondary | ICD-10-CM

## 2020-03-28 DIAGNOSIS — L578 Other skin changes due to chronic exposure to nonionizing radiation: Secondary | ICD-10-CM | POA: Diagnosis not present

## 2020-03-28 DIAGNOSIS — L821 Other seborrheic keratosis: Secondary | ICD-10-CM

## 2020-03-28 DIAGNOSIS — L814 Other melanin hyperpigmentation: Secondary | ICD-10-CM | POA: Diagnosis not present

## 2020-03-28 DIAGNOSIS — B079 Viral wart, unspecified: Secondary | ICD-10-CM | POA: Diagnosis not present

## 2020-03-28 NOTE — Progress Notes (Signed)
Follow-Up Visit   Subjective  Savannah Cox is a 74 y.o. female who presents for the following: fbse.  Patient here for full body skin exam and skin cancer screening. Patient has a few areas of concern today on her left shoulder and her left knee. Patient states that she has no h/o skin cancer   The following portions of the chart were reviewed this encounter and updated as appropriate:  Tobacco  Allergies  Meds  Problems  Med Hx  Surg Hx  Fam Hx      Review of Systems:  No other skin or systemic complaints except as noted in HPI or Assessment and Plan.  Objective  Well appearing patient in no apparent distress; mood and affect are within normal limits.  A full examination was performed including scalp, head, eyes, ears, nose, lips, neck, chest, axillae, abdomen, back, buttocks, bilateral upper extremities, bilateral lower extremities, hands, feet, fingers, toes, fingernails, and toenails. All findings within normal limits unless otherwise noted below.   Objective  Left Pretibia: 0.5 erythematous verrucous papule       Objective  Left Superior Shoulder: 0.4 cm erythematous verrucous papule        Assessment & Plan  Neoplasm of uncertain behavior (2) Left Pretibia  Epidermal / dermal shaving  Lesion diameter (cm):  0.5 Informed consent: discussed and consent obtained   Timeout: patient name, date of birth, surgical site, and procedure verified   Anesthesia: the lesion was anesthetized in a standard fashion   Anesthetic:  1% lidocaine w/ epinephrine 1-100,000 buffered w/ 8.4% NaHCO3 Instrument used: flexible razor blade   Outcome: patient tolerated procedure well   Post-procedure details: sterile dressing applied and wound care instructions given   Dressing type: petrolatum and pressure dressing    Specimen 1 - Surgical pathology Differential Diagnosis: R/O traumatized verrucous papule vs other Check Margins: No  Left Superior Shoulder  Epidermal /  dermal shaving  Lesion diameter (cm):  0.4 Informed consent: discussed and consent obtained   Timeout: patient name, date of birth, surgical site, and procedure verified   Anesthesia: the lesion was anesthetized in a standard fashion   Anesthetic:  1% lidocaine w/ epinephrine 1-100,000 buffered w/ 8.4% NaHCO3 Instrument used: flexible razor blade   Outcome: patient tolerated procedure well   Post-procedure details: sterile dressing applied and wound care instructions given   Dressing type: petrolatum and pressure dressing    Specimen 2 - Surgical pathology Differential Diagnosis: R/O verrucous papule vs traumatized acrochordon vs other Check Margins: No   Lentigines - Scattered tan macules - Discussed due to sun exposure - Benign, observe - Call for any changes  Seborrheic Keratoses - Stuck-on, waxy, tan-brown papules and plaques  - Discussed benign etiology and prognosis. - Observe - Call for any changes  Melanocytic Nevi - Tan-brown and/or pink-flesh-colored symmetric macules and papules - Benign appearing on exam today - Observation - Call clinic for new or changing moles - Recommend daily use of broad spectrum spf 30+ sunscreen to sun-exposed areas.   Hemangiomas - Red papules - Discussed benign nature - Observe - Call for any changes  Actinic Damage - diffuse scaly erythematous macules with underlying dyspigmentation - Recommend daily broad spectrum sunscreen SPF 30+ to sun-exposed areas, reapply every 2 hours as needed.  - Call for new or changing lesions.  Skin cancer screening performed today.   Return in about 1 year (around 03/28/2021) for TBSE.  I, Donzetta Kohut, CMA, am acting as scribe for [employer],  MD .  Documentation: I have reviewed the above documentation for accuracy and completeness, and I agree with the above.  Forest Gleason, MD

## 2020-03-28 NOTE — Patient Instructions (Addendum)

## 2020-04-04 ENCOUNTER — Other Ambulatory Visit: Payer: Self-pay | Admitting: Internal Medicine

## 2020-04-12 ENCOUNTER — Encounter: Payer: Medicare PPO | Admitting: Dermatology

## 2020-05-09 ENCOUNTER — Other Ambulatory Visit: Payer: Self-pay

## 2020-05-09 ENCOUNTER — Other Ambulatory Visit (INDEPENDENT_AMBULATORY_CARE_PROVIDER_SITE_OTHER): Payer: Medicare PPO

## 2020-05-09 DIAGNOSIS — I10 Essential (primary) hypertension: Secondary | ICD-10-CM

## 2020-05-09 DIAGNOSIS — E7800 Pure hypercholesterolemia, unspecified: Secondary | ICD-10-CM | POA: Diagnosis not present

## 2020-05-09 LAB — HEPATIC FUNCTION PANEL
ALT: 23 U/L (ref 0–35)
AST: 32 U/L (ref 0–37)
Albumin: 4 g/dL (ref 3.5–5.2)
Alkaline Phosphatase: 35 U/L — ABNORMAL LOW (ref 39–117)
Bilirubin, Direct: 0.1 mg/dL (ref 0.0–0.3)
Total Bilirubin: 0.4 mg/dL (ref 0.2–1.2)
Total Protein: 6.2 g/dL (ref 6.0–8.3)

## 2020-05-09 LAB — BASIC METABOLIC PANEL
BUN: 13 mg/dL (ref 6–23)
CO2: 28 mEq/L (ref 19–32)
Calcium: 8.9 mg/dL (ref 8.4–10.5)
Chloride: 108 mEq/L (ref 96–112)
Creatinine, Ser: 0.83 mg/dL (ref 0.40–1.20)
GFR: 70.42 mL/min (ref >60.00)
Glucose, Bld: 91 mg/dL (ref 70–99)
Potassium: 4.2 mEq/L (ref 3.5–5.1)
Sodium: 142 mEq/L (ref 135–145)

## 2020-05-09 LAB — LIPID PANEL
Cholesterol: 210 mg/dL — ABNORMAL HIGH (ref 0–200)
HDL: 80.9 mg/dL (ref >39.00)
LDL Cholesterol: 112 mg/dL — ABNORMAL HIGH (ref 0–99)
NonHDL: 128.93
Total CHOL/HDL Ratio: 3
Triglycerides: 86 mg/dL (ref 0.0–149.0)
VLDL: 17.2 mg/dL (ref 0.0–40.0)

## 2020-05-09 LAB — TSH: TSH: 6.72 u[IU]/mL — ABNORMAL HIGH (ref 0.35–4.50)

## 2020-05-14 DIAGNOSIS — J432 Centrilobular emphysema: Secondary | ICD-10-CM | POA: Diagnosis not present

## 2020-05-14 DIAGNOSIS — I700 Atherosclerosis of aorta: Secondary | ICD-10-CM | POA: Diagnosis not present

## 2020-05-14 DIAGNOSIS — R0789 Other chest pain: Secondary | ICD-10-CM | POA: Diagnosis not present

## 2020-05-14 DIAGNOSIS — E7800 Pure hypercholesterolemia, unspecified: Secondary | ICD-10-CM | POA: Diagnosis not present

## 2020-05-15 ENCOUNTER — Ambulatory Visit: Payer: Medicare PPO | Admitting: Internal Medicine

## 2020-05-15 ENCOUNTER — Other Ambulatory Visit: Payer: Self-pay

## 2020-05-15 DIAGNOSIS — K219 Gastro-esophageal reflux disease without esophagitis: Secondary | ICD-10-CM | POA: Diagnosis not present

## 2020-05-15 DIAGNOSIS — E7800 Pure hypercholesterolemia, unspecified: Secondary | ICD-10-CM

## 2020-05-15 DIAGNOSIS — I10 Essential (primary) hypertension: Secondary | ICD-10-CM

## 2020-05-15 DIAGNOSIS — J432 Centrilobular emphysema: Secondary | ICD-10-CM | POA: Diagnosis not present

## 2020-05-15 DIAGNOSIS — I700 Atherosclerosis of aorta: Secondary | ICD-10-CM

## 2020-05-15 DIAGNOSIS — D72819 Decreased white blood cell count, unspecified: Secondary | ICD-10-CM | POA: Diagnosis not present

## 2020-05-15 DIAGNOSIS — E039 Hypothyroidism, unspecified: Secondary | ICD-10-CM | POA: Diagnosis not present

## 2020-05-15 DIAGNOSIS — D649 Anemia, unspecified: Secondary | ICD-10-CM | POA: Diagnosis not present

## 2020-05-15 DIAGNOSIS — M810 Age-related osteoporosis without current pathological fracture: Secondary | ICD-10-CM | POA: Diagnosis not present

## 2020-05-15 NOTE — Patient Instructions (Signed)
Increase your thyroid medication to 82mcg every day.   Follow your blood pressures as we discussed.  Call with readings - in 2-3 weeks.

## 2020-05-15 NOTE — Progress Notes (Signed)
Patient ID: Meghen Akopyan, female   DOB: 1946/01/10, 74 y.o.   MRN: [id number]   Subjective:    Patient ID: Casimira Sutphin, female    DOB: 03-30-1946, 74 y.o.   MRN: [id number]  HPI This visit occurred during the SARS-CoV-2 public health emergency.  Safety protocols were in place, including screening questions prior to the visit, additional usage of staff PPE, and extensive cleaning of exam room while observing appropriate contact time as indicated for disinfecting solutions.  Patient here for a scheduled follow up.  She is here to follow up regarding her cholesterol and blood pressure.  She saw Dr Ubaldo Glassing yesterday.  Had been off her lisinopril/hctz for approximately one week.  Her blood pressure reading was good in his office, she he told her to remain off the medication.  Increased stress.  Has just settled a family estate.  Overall she feels she is handling things relatively well.  Some fatigue.  No chest pain or sob reported. No abdominal pain or bowel change reported.  Discussed labs.     Past Medical History:  Diagnosis Date  . Allergic state   . Anemia   . Complication of anesthesia   . Diverticulosis   . GERD (gastroesophageal reflux disease)   . History of hiatal hernia   . History of kidney stones   . Hypercholesterolemia   . Hypertension   . Hypothyroidism   . Osteoporosis, postmenopausal   . PONV (postoperative nausea and vomiting)    nauseated with colonoscopy  . Shingles    Past Surgical History:  Procedure Laterality Date  . BREAST BIOPSY Left 90s   benign  . COLONOSCOPY WITH PROPOFOL N/A 01/28/2016   Procedure: COLONOSCOPY WITH PROPOFOL;  Surgeon: Lollie Sails, MD;  Location: [facility] ENDOSCOPY;  Service: Endoscopy;  Laterality: N/A;  . EXCISION METACARPAL MASS Right 04/30/2017   Procedure: EXCISION MUCOID CYST FIFTH FINGER RIGHT HAND;  Surgeon: Hessie Knows, MD;  Location: [facility] ORS;  Service: Orthopedics;  Laterality: Right;  . EYE SURGERY Bilateral 2010    cataracts   Family History  Problem Relation Age of Onset  . Heart disease Father        died age 86 - myocardial infarction  . Hypercholesterolemia Mother   . Arthritis Mother   . Heart attack Mother   . Kidney cancer Other        aunt  . Breast cancer Maternal Aunt   . Colon cancer Neg Hx    Social History   Socioeconomic History  . Marital status: Married    Spouse name: Not on file  . Number of children: 3  . Years of education: 95  . Highest education level: Not on file  Occupational History  . Occupation: retired Product manager: RETIRED  Tobacco Use  . Smoking status: Former Smoker    Packs/day: 1.50    Years: 30.00    Pack years: 45.00    Types: Cigarettes    Quit date: 1990    Years since quitting: 31.9  . Smokeless tobacco: Never Used  Vaping Use  . Vaping Use: Never used  Substance and Sexual Activity  . Alcohol use: Yes    Alcohol/week: 0.0 standard drinks    Comment: Rare drink, one drink per month  . Drug use: No  . Sexual activity: Yes  Other Topics Concern  . Not on file  Social History Narrative  . Not on file   Social Determinants of Health   Financial  Resource Strain: Not on file  Food Insecurity: Not on file  Transportation Needs: Not on file  Physical Activity: Not on file  Stress: Not on file  Social Connections: Not on file    Outpatient Encounter Medications as of 05/15/2020  Medication Sig  . betamethasone dipropionate 0.05 % cream Apply topically 2 (two) times daily as needed.  . Fluticasone-Umeclidin-Vilant (TRELEGY ELLIPTA) 100-62.5-25 MCG/INH AEPB Inhale 1 puff into the lungs daily.  Marland Kitchen levothyroxine (SYNTHROID) 75 MCG tablet TAKE 1 TABLET BY MOUTH EVERY DAY  . lisinopril-hydrochlorothiazide (ZESTORETIC) 10-12.5 MG tablet TAKE 1 TABLET BY MOUTH EVERY DAY  . loratadine (CLARITIN) 10 MG tablet Take 10 mg by mouth daily.   . Multiple Vitamins-Minerals (CENTRUM SILVER ULTRA WOMENS PO) Take 1 tablet by mouth daily.   Marland Kitchen  omeprazole (PRILOSEC) 40 MG capsule TAKE 1 CAPSULE (40 MG TOTAL) BY MOUTH ONCE DAILY TAKE 30 MINS BEFORE A MEAL  . rosuvastatin (CRESTOR) 5 MG tablet Take 10mg  on Monday and Thursday and 5mg  all other days.  Marland Kitchen sertraline (ZOLOFT) 50 MG tablet TAKE 1 TABLET BY MOUTH EVERY DAY   No facility-administered encounter medications on file as of 05/15/2020.    Review of Systems  Constitutional: Negative for appetite change and unexpected weight change.  HENT: Negative for congestion and sinus pressure.   Respiratory: Negative for cough, chest tightness and shortness of breath.   Cardiovascular: Negative for chest pain, palpitations and leg swelling.  Gastrointestinal: Negative for abdominal pain, diarrhea, nausea and vomiting.  Genitourinary: Negative for difficulty urinating and dysuria.  Musculoskeletal: Negative for joint swelling and myalgias.  Skin: Negative for color change and rash.  Neurological: Negative for dizziness, light-headedness and headaches.  Psychiatric/Behavioral: Negative for agitation and dysphoric mood.       Objective:    Physical Exam Vitals reviewed.  Constitutional:      General: She is not in acute distress.    Appearance: Normal appearance.  HENT:     Head: Normocephalic and atraumatic.     Right Ear: External ear normal.     Left Ear: External ear normal.     Mouth/Throat:     Mouth: Oropharynx is clear and moist.  Eyes:     General:        Right eye: No discharge.        Left eye: No discharge.     Conjunctiva/sclera: Conjunctivae normal.  Neck:     Thyroid: No thyromegaly.  Cardiovascular:     Rate and Rhythm: Normal rate and regular rhythm.  Pulmonary:     Effort: No respiratory distress.     Breath sounds: Normal breath sounds. No wheezing.  Abdominal:     General: Bowel sounds are normal.     Palpations: Abdomen is soft.     Tenderness: There is no abdominal tenderness.  Musculoskeletal:        General: No swelling, tenderness or edema.      Cervical back: Neck supple. No tenderness.  Lymphadenopathy:     Cervical: No cervical adenopathy.  Skin:    Findings: No erythema or rash.  Neurological:     Mental Status: She is alert.  Psychiatric:        Behavior: Behavior normal.     BP 140/76   Pulse 84   Temp 98.2 F (36.8 C) (Oral)   Resp 16   Ht 5\' 3"  (1.6 m)   Wt 145 lb 6.4 oz (66 kg)   SpO2 98%   BMI 25.76 kg/m  Wt Readings from Last 3 Encounters:  05/15/20 145 lb 6.4 oz (66 kg)  02/13/20 141 lb (64 kg)  01/25/20 140 lb (63.5 kg)     Lab Results  Component Value Date   WBC 5.0 01/05/2020   HGB 13.1 01/05/2020   HCT 38.7 01/05/2020   PLT 236.0 01/05/2020   GLUCOSE 91 05/09/2020   CHOL 210 (H) 05/09/2020   TRIG 86.0 05/09/2020   HDL 80.90 05/09/2020   LDLDIRECT 165.9 05/11/2013   LDLCALC 112 (H) 05/09/2020   ALT 23 05/09/2020   AST 32 05/09/2020   NA 142 05/09/2020   K 4.2 05/09/2020   CL 108 05/09/2020   CREATININE 0.83 05/09/2020   BUN 13 05/09/2020   CO2 28 05/09/2020   TSH 6.72 (H) 05/09/2020   INR 0.96 04/14/2016    CT HEMATURIA WORKUP  Result Date: 02/10/2020 CLINICAL DATA:  Hematuria EXAM: CT ABDOMEN AND PELVIS WITHOUT AND WITH CONTRAST TECHNIQUE: Multidetector CT imaging of the abdomen and pelvis was performed following the standard protocol before and following the bolus administration of intravenous contrast. CONTRAST:  143mL OMNIPAQUE IOHEXOL 300 MG/ML  SOLN COMPARISON:  April 19, 2014, also exams from 2017 and 2018. FINDINGS: Lower chest: No consolidation.  No pleural effusion. Hepatobiliary: Suspect background hepatic steatosis. No focal, suspicious hepatic lesion. No pericholecystic stranding. Portal vein is patent in the liver. Pancreas: Pancreas without focal lesion, ductal dilation or inflammation. Spleen: Spleen normal size and contour. Adrenals/Urinary Tract: Adrenal glands are normal. No nephrolithiasis. Symmetric renal enhancement. Small cysts in the bilateral kidneys largest on  the RIGHT in the upper pole 13 mm and on the LEFT in the interpolar LEFT kidney also approximately 13 mm. No hydronephrosis. 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 Funneling of the ureter through the hiatus at the pelvic floor along with the bladder base. Descent of the bladder base below the pelvic floor compatible with cystocele, approximately 2.5 cm below the pubococcygeal line. Funneling of the ureter into the anterior compartment pelvic floor descent. Bifid LEFT renal pelvis may explain some of the distortion of the renal pelvic anatomy seen on the venous phase assessment. There is no filling defect in the upper tracts. Stomach/Bowel: No acute gastrointestinal process. The appendix is normal. Stool fills much of the colon. Colonic diverticulosis. Vascular/Lymphatic: Calcified atheromatous plaque in the abdominal aorta without aneurysm. Moderate calcification. Retroaortic LEFT renal vein. No adenopathy in the upper abdomen or in the retroperitoneum. No pelvic lymphadenopathy. Reproductive: Atrophic uterus. No pelvic mass. Pelvic floor dysfunction as described. Other: Small fat containing umbilical hernia. Musculoskeletal: No acute musculoskeletal process. No destructive bone finding. IMPRESSION: 1. Mild diffuse urothelial enhancement particularly on the LEFT may be due to urinary tract infection or transient reflux or obstruction in the setting of marked pelvic floor dysfunction with funneling of the bladder base through the anterior compartment of the pelvic floor hiatus. Uro-gynecologic consultation may be helpful. Consider follow-up urinalysis with further imaging as warranted. No upper tract lesion seen on excretory phase imaging. 2. Moderately large cystocele presumably without provocation or straining, may worsen with straining. Correlate with urinary symptoms. 3. Bilateral renal cysts. 4. Colonic diverticulosis without evidence of acute  diverticulitis. 5. Aortic atherosclerosis. 6. Small fat containing umbilical hernia. Aortic Atherosclerosis (ICD10-I70.0). Electronically Signed   By: Zetta Bills M.D.   On: 02/10/2020 16:37       Assessment & Plan:   Problem List Items Addressed This Visit    Osteoporosis  Seeing endocrinology.  Reclast.       Leukopenia    Follow cbc.       Hypothyroidism    On thyroid replacement.  Follow tsh. Recent tsh slightly elevated.  Increase to 88mcg q day.  Recheck tsh.       Hypertension    Was told to remain off lisinopril/hctz by cardiology.  Blood pressure as outlined.  Elevated today.  Have her spot check her pressure.  Send in readings.  Schedule f/u.  If needs to restart, can start lisinopril 10mg  q day.  Follow.       Hypercholesterolemia    On crestor.  Low cholesterol diet and exercise.  Follow lipid panel and liver function tests.       GERD (gastroesophageal reflux disease)    No upper symptoms reported.  On prilosec.       Centrilobular emphysema ([place])    Evaluated by pulmonary 01/2020.  Trial of trelegy.  Possible cxr at next visit.  Breathing stable.        Aortic atherosclerosis (HCC)    Continue crestor.       Anemia    Follow cbc.           Einar Pheasant, MD

## 2020-05-22 DIAGNOSIS — H16223 Keratoconjunctivitis sicca, not specified as Sjogren's, bilateral: Secondary | ICD-10-CM | POA: Diagnosis not present

## 2020-05-27 ENCOUNTER — Encounter: Payer: Self-pay | Admitting: Internal Medicine

## 2020-05-27 NOTE — Assessment & Plan Note (Signed)
Follow cbc.  

## 2020-05-27 NOTE — Assessment & Plan Note (Signed)
Was told to remain off lisinopril/hctz by cardiology.  Blood pressure as outlined.  Elevated today.  Have her spot check her pressure.  Send in readings.  Schedule f/u.  If needs to restart, can start lisinopril 10mg  q day.  Follow.

## 2020-05-27 NOTE — Assessment & Plan Note (Signed)
Evaluated by pulmonary 01/2020.  Trial of trelegy.  Possible cxr at next visit.  Breathing stable.

## 2020-05-27 NOTE — Assessment & Plan Note (Signed)
No upper symptoms reported. On prilosec.  

## 2020-05-27 NOTE — Assessment & Plan Note (Addendum)
On thyroid replacement.  Follow tsh. Recent tsh slightly elevated.  Increase to 17mcg q day.  Recheck tsh.

## 2020-05-27 NOTE — Assessment & Plan Note (Signed)
Seeing endocrinology.  Reclast.   

## 2020-05-27 NOTE — Assessment & Plan Note (Signed)
On crestor.  Low cholesterol diet and exercise.  Follow lipid panel and liver function tests.   

## 2020-05-27 NOTE — Assessment & Plan Note (Signed)
Continue crestor 

## 2020-06-01 ENCOUNTER — Encounter: Payer: Self-pay | Admitting: Internal Medicine

## 2020-06-02 MED ORDER — LISINOPRIL 10 MG PO TABS
10.0000 mg | ORAL_TABLET | Freq: Every day | ORAL | 1 refills | Status: DC
Start: 2020-06-02 — End: 2020-11-26

## 2020-06-02 NOTE — Telephone Encounter (Signed)
rx sent in for lisinopril #90 with 1 refill.

## 2020-06-15 ENCOUNTER — Other Ambulatory Visit: Payer: Self-pay | Admitting: Internal Medicine

## 2020-06-26 ENCOUNTER — Other Ambulatory Visit: Payer: Self-pay | Admitting: Internal Medicine

## 2020-07-12 ENCOUNTER — Other Ambulatory Visit: Payer: Self-pay

## 2020-07-12 ENCOUNTER — Ambulatory Visit: Payer: Medicare PPO | Admitting: Internal Medicine

## 2020-07-12 VITALS — BP 118/70 | HR 91 | Temp 98.0°F | Resp 16 | Ht 63.0 in | Wt 142.4 lb

## 2020-07-12 DIAGNOSIS — J432 Centrilobular emphysema: Secondary | ICD-10-CM

## 2020-07-12 DIAGNOSIS — I10 Essential (primary) hypertension: Secondary | ICD-10-CM

## 2020-07-12 DIAGNOSIS — E039 Hypothyroidism, unspecified: Secondary | ICD-10-CM | POA: Diagnosis not present

## 2020-07-12 DIAGNOSIS — E7800 Pure hypercholesterolemia, unspecified: Secondary | ICD-10-CM

## 2020-07-12 DIAGNOSIS — Z1231 Encounter for screening mammogram for malignant neoplasm of breast: Secondary | ICD-10-CM | POA: Diagnosis not present

## 2020-07-12 DIAGNOSIS — D649 Anemia, unspecified: Secondary | ICD-10-CM | POA: Diagnosis not present

## 2020-07-12 DIAGNOSIS — I700 Atherosclerosis of aorta: Secondary | ICD-10-CM | POA: Diagnosis not present

## 2020-07-12 DIAGNOSIS — K219 Gastro-esophageal reflux disease without esophagitis: Secondary | ICD-10-CM | POA: Diagnosis not present

## 2020-07-12 LAB — TSH: TSH: 1.07 u[IU]/mL (ref 0.35–4.50)

## 2020-07-12 NOTE — Progress Notes (Signed)
Patient ID: Savannah Cox, female   DOB: [birth year], 75 y.o.   MRN: [id number]   Subjective:    Patient ID: Savannah Cox, female    DOB: [birth year], 75 y.o.   MRN: [id number]  HPI This visit occurred during the SARS-CoV-2 public health emergency.  Safety protocols were in place, including screening questions prior to the visit, additional usage of staff PPE, and extensive cleaning of exam room while observing appropriate contact time as indicated for disinfecting solutions.  Patient here for a scheduled follow up.  Here to follow up regarding her blood pressure, thyroid and cholesterol.  She reports she is doing well.  Feels good.  No congestion or cough.  No chest pain or sob.  No acid reflux. No abdominal pain.  Bowels moving.  She is on lisinopril 10mg  q day now.  Blood pressure is doing well - systolic readings averaging 120s.  Recent tsh elevated.  Synthroid dose adjusted.  Due f/u tsh.   Past Medical History:  Diagnosis Date  . Allergic state   . Anemia   . Complication of anesthesia   . Diverticulosis   . GERD (gastroesophageal reflux disease)   . History of hiatal hernia   . History of kidney stones   . Hypercholesterolemia   . Hypertension   . Hypothyroidism   . Osteoporosis, postmenopausal   . PONV (postoperative nausea and vomiting)    nauseated with colonoscopy  . Shingles    Past Surgical History:  Procedure Laterality Date  . BREAST BIOPSY Left 90s   benign  . COLONOSCOPY WITH PROPOFOL N/A 01/28/2016   Procedure: COLONOSCOPY WITH PROPOFOL;  Surgeon: Lollie Sails, MD;  Location: [facility] ENDOSCOPY;  Service: Endoscopy;  Laterality: N/A;  . EXCISION METACARPAL MASS Right 04/30/2017   Procedure: EXCISION MUCOID CYST FIFTH FINGER RIGHT HAND;  Surgeon: Hessie Knows, MD;  Location: [facility] ORS;  Service: Orthopedics;  Laterality: Right;  . EYE SURGERY Bilateral 2010   cataracts   Family History  Problem Relation Age of Onset  . Heart disease Father        died age  41 - myocardial infarction  . Hypercholesterolemia Mother   . Arthritis Mother   . Heart attack Mother   . Kidney cancer Other        aunt  . Breast cancer Maternal Aunt   . Colon cancer Neg Hx    Social History   Socioeconomic History  . Marital status: Married    Spouse name: Not on file  . Number of children: 3  . Years of education: 92  . Highest education level: Not on file  Occupational History  . Occupation: retired Product manager: RET[employer]  Tobacco Use  . Smoking status: Former Smoker    Packs/day: 1.50    Years: 30.00    Pack years: 45.00    Types: Cigarettes    Quit date: 1990    Years since quitting: 32.1  . Smokeless tobacco: Never Used  Vaping Use  . Vaping Use: Never used  Substance and Sexual Activity  . Alcohol use: Yes    Alcohol/week: 0.0 standard drinks    Comment: Rare drink, one drink per month  . Drug use: No  . Sexual activity: Yes  Other Topics Concern  . Not on file  Social History Narrative  . Not on file   Social Determinants of Health   Financial Resource Strain: Not on file  Food Insecurity: Not on file  Transportation Needs: Not  on file  Physical Activity: Not on file  Stress: Not on file  Social Connections: Not on file    Outpatient Encounter Medications as of 07/12/2020  Medication Sig  . betamethasone dipropionate 0.05 % cream Apply topically 2 (two) times daily as needed.  . Fluticasone-Umeclidin-Vilant (TRELEGY ELLIPTA) 100-62.5-25 MCG/INH AEPB Inhale 1 puff into the lungs daily.  Marland Kitchen levothyroxine (SYNTHROID) 75 MCG tablet TAKE 1 TABLET BY MOUTH EVERY DAY  . lisinopril (ZESTRIL) 10 MG tablet Take 1 tablet (10 mg total) by mouth daily.  Marland Kitchen loratadine (CLARITIN) 10 MG tablet Take 10 mg by mouth daily.   . Multiple Vitamins-Minerals (CENTRUM SILVER ULTRA WOMENS PO) Take 1 tablet by mouth daily.   Marland Kitchen omeprazole (PRILOSEC) 40 MG capsule TAKE 1 CAPSULE (40 MG TOTAL) BY MOUTH ONCE DAILY TAKE 30 MINS BEFORE A MEAL  .  rosuvastatin (CRESTOR) 5 MG tablet TAKE 1 TABLET BY MOUTH EVERY DAY  . sertraline (ZOLOFT) 50 MG tablet TAKE 1 TABLET BY MOUTH EVERY DAY   No facility-administered encounter medications on file as of 07/12/2020.    Review of Systems  Constitutional: Negative for appetite change and unexpected weight change.  HENT: Negative for congestion and sinus pressure.   Respiratory: Negative for cough, chest tightness and shortness of breath.   Cardiovascular: Negative for chest pain, palpitations and leg swelling.  Gastrointestinal: Negative for abdominal pain, diarrhea, nausea and vomiting.  Genitourinary: Negative for difficulty urinating and dysuria.  Musculoskeletal: Negative for joint swelling and myalgias.  Skin: Negative for color change and rash.  Neurological: Negative for dizziness, light-headedness and headaches.  Psychiatric/Behavioral: Negative for agitation and dysphoric mood.       Objective:    Physical Exam Vitals reviewed.  Constitutional:      General: She is not in acute distress.    Appearance: Normal appearance.  HENT:     Head: Normocephalic and atraumatic.     Right Ear: External ear normal.     Left Ear: External ear normal.     Mouth/Throat:     Mouth: Oropharynx is clear and moist.  Eyes:     General: No scleral icterus.       Right eye: No discharge.        Left eye: No discharge.     Conjunctiva/sclera: Conjunctivae normal.  Neck:     Thyroid: No thyromegaly.  Cardiovascular:     Rate and Rhythm: Normal rate and regular rhythm.  Pulmonary:     Effort: No respiratory distress.     Breath sounds: Normal breath sounds. No wheezing.  Abdominal:     General: Bowel sounds are normal.     Palpations: Abdomen is soft.     Tenderness: There is no abdominal tenderness.  Musculoskeletal:        General: No swelling, tenderness or edema.     Cervical back: Neck supple. No tenderness.  Lymphadenopathy:     Cervical: No cervical adenopathy.  Skin:     Findings: No erythema or rash.  Neurological:     Mental Status: She is alert.  Psychiatric:        Mood and Affect: Mood normal.        Behavior: Behavior normal.     BP 118/70   Pulse 91   Temp 98 F (36.7 C) (Oral)   Resp 16   Ht 5\' 3"  (1.6 m)   Wt 142 lb 6.4 oz (64.6 kg)   SpO2 97%   BMI 25.23 kg/m  Wt Readings  from Last 3 Encounters:  07/12/20 142 lb 6.4 oz (64.6 kg)  05/15/20 145 lb 6.4 oz (66 kg)  02/13/20 141 lb (64 kg)     Lab Results  Component Value Date   WBC 5.0 01/05/2020   HGB 13.1 01/05/2020   HCT 38.7 01/05/2020   PLT 236.0 01/05/2020   GLUCOSE 91 05/09/2020   CHOL 210 (H) 05/09/2020   TRIG 86.0 05/09/2020   HDL 80.90 05/09/2020   LDLDIRECT 165.9 05/11/2013   LDLCALC 112 (H) 05/09/2020   ALT 23 05/09/2020   AST 32 05/09/2020   NA 142 05/09/2020   K 4.2 05/09/2020   CL 108 05/09/2020   CREATININE 0.83 05/09/2020   BUN 13 05/09/2020   CO2 28 05/09/2020   TSH 1.07 07/12/2020   INR 0.96 04/14/2016    CT HEMATURIA WORKUP  Result Date: 02/10/2020 CLINICAL DATA:  Hematuria EXAM: CT ABDOMEN AND PELVIS WITHOUT AND WITH CONTRAST TECHNIQUE: Multidetector CT imaging of the abdomen and pelvis was performed following the standard protocol before and following the bolus administration of intravenous contrast. CONTRAST:  146mL OMNIPAQUE IOHEXOL 300 MG/ML  SOLN COMPARISON:  April 19, 2014, also exams from 2017 and 2018. FINDINGS: Lower chest: No consolidation.  No pleural effusion. Hepatobiliary: Suspect background hepatic steatosis. No focal, suspicious hepatic lesion. No pericholecystic stranding. Portal vein is patent in the liver. Pancreas: Pancreas without focal lesion, ductal dilation or inflammation. Spleen: Spleen normal size and contour. Adrenals/Urinary Tract: Adrenal glands are normal. No nephrolithiasis. Symmetric renal enhancement. Small cysts in the bilateral kidneys largest on the RIGHT in the upper pole 13 mm and on the LEFT in the interpolar LEFT  kidney also approximately 13 mm. No hydronephrosis. 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 Funneling of the ureter through the hiatus at the pelvic floor along with the bladder base. Descent of the bladder base below the pelvic floor compatible with cystocele, approximately 2.5 cm below the pubococcygeal line. Funneling of the ureter into the anterior compartment pelvic floor descent. Bifid LEFT renal pelvis may explain some of the distortion of the renal pelvic anatomy seen on the venous phase assessment. There is no filling defect in the upper tracts. Stomach/Bowel: No acute gastrointestinal process. The appendix is normal. Stool fills much of the colon. Colonic diverticulosis. Vascular/Lymphatic: Calcified atheromatous plaque in the abdominal aorta without aneurysm. Moderate calcification. Retroaortic LEFT renal vein. No adenopathy in the upper abdomen or in the retroperitoneum. No pelvic lymphadenopathy. Reproductive: Atrophic uterus. No pelvic mass. Pelvic floor dysfunction as described. Other: Small fat containing umbilical hernia. Musculoskeletal: No acute musculoskeletal process. No destructive bone finding. IMPRESSION: 1. Mild diffuse urothelial enhancement particularly on the LEFT may be due to urinary tract infection or transient reflux or obstruction in the setting of marked pelvic floor dysfunction with funneling of the bladder base through the anterior compartment of the pelvic floor hiatus. Uro-gynecologic consultation may be helpful. Consider follow-up urinalysis with further imaging as warranted. No upper tract lesion seen on excretory phase imaging. 2. Moderately large cystocele presumably without provocation or straining, may worsen with straining. Correlate with urinary symptoms. 3. Bilateral renal cysts. 4. Colonic diverticulosis without evidence of acute diverticulitis. 5. Aortic atherosclerosis. 6. Small fat containing umbilical hernia.  Aortic Atherosclerosis (ICD10-I70.0). Electronically Signed   By: Zetta Bills M.D.   On: 02/10/2020 16:37       Assessment & Plan:   Problem List Items Addressed This Visit    Anemia  Follow cbc.       Aortic atherosclerosis (HCC)    Continue crestor.       Centrilobular emphysema ([place])    Evaluated by pulmonary 01/2020.  Trial of trelegy.  Breathing stable. Continue f/u with pulmonary.  (they mentioned possible f/u cxr at next appt).       GERD (gastroesophageal reflux disease)    No upper symptoms.  Prilosec.       Hypercholesterolemia    On crestor.  Low cholesterol diet and exercise.  Follow lipid panel and liver function tests.        Relevant Orders   Hepatic function panel   Lipid panel   Hypertension    Now on lisinopril 10mg  q day.  (off hctz).  Blood pressure doing well.  Continue lisinopril.  Follow pressures.  Follow metabolic panel.       Relevant Orders   Basic metabolic panel   Hypothyroidism    On thyroid replacement.  Just recently adjusted synthroid dose.  On 60mcg q day now.  Recheck tsh today.       Relevant Orders   TSH (Completed)   TSH    Other Visit Diagnoses    Encounter for screening mammogram for malignant neoplasm of breast    -  Primary   Relevant Orders   MM 3D SCREEN BREAST BILATERAL       Einar Pheasant, MD

## 2020-07-15 ENCOUNTER — Encounter: Payer: Self-pay | Admitting: Internal Medicine

## 2020-07-15 NOTE — Assessment & Plan Note (Signed)
Evaluated by pulmonary 01/2020.  Trial of trelegy.  Breathing stable. Continue f/u with pulmonary.  (they mentioned possible f/u cxr at next appt).

## 2020-07-15 NOTE — Assessment & Plan Note (Signed)
Follow cbc.  

## 2020-07-15 NOTE — Assessment & Plan Note (Signed)
On crestor.  Low cholesterol diet and exercise.  Follow lipid panel and liver function tests.   

## 2020-07-15 NOTE — Assessment & Plan Note (Signed)
Now on lisinopril 10mg  q day.  (off hctz).  Blood pressure doing well.  Continue lisinopril.  Follow pressures.  Follow metabolic panel.

## 2020-07-15 NOTE — Assessment & Plan Note (Signed)
On thyroid replacement.  Just recently adjusted synthroid dose.  On 40mcg q day now.  Recheck tsh today.

## 2020-07-15 NOTE — Assessment & Plan Note (Signed)
Continue crestor 

## 2020-07-15 NOTE — Assessment & Plan Note (Signed)
No upper symptoms.  Prilosec.

## 2020-07-17 ENCOUNTER — Telehealth: Payer: Self-pay | Admitting: Internal Medicine

## 2020-07-17 ENCOUNTER — Telehealth: Payer: Self-pay

## 2020-07-17 NOTE — Telephone Encounter (Signed)
I can send in zofran for nausea, but if she is not able to eat and bloating, etc, she needs to be seen.

## 2020-07-17 NOTE — Telephone Encounter (Signed)
She stated she wants to be seen. Scheduled tomorrow virtual @0800  with Dr Caryl Bis.

## 2020-07-17 NOTE — Telephone Encounter (Signed)
Pt said she has been sick for 3 days now. She said she was throwing up Sat until all she had left was yellow bile. On Sunday she had diarrhea. As of yesterday she has nothing left to throw up but she is nauseous and can't eat. She took a rapid covid test but that was negative. She wants something to calm her stomach or wants to know what Dr. Nicki Reaper thinks she should do?

## 2020-07-17 NOTE — Telephone Encounter (Signed)
Called Ms Tuckett to follow up and get more information.  She was able to eat potato soup this pm.  Offered to call in zofran. She is feeling some better.  Still with diarrhea.  States has appt in am.  No abdominal pain. Wants to monitor overnight and plans to f/u with Dr Caryl Bis in am.  Will let me know if needs something more.

## 2020-07-17 NOTE — Telephone Encounter (Signed)
Patient has been sick for 3 days having nausea, vomiting, diarrhea, bloating. Imodium helped the diarrhea but as of this morning she has not been able to eat and keep it down. Has a lot of gas. COVID test is negative. Denies feeling weak or any other acute symptoms. She is requesting to have something called in for nausea to see if that helps her so she can eat. Advised she may need appt.

## 2020-07-18 ENCOUNTER — Encounter: Payer: Self-pay | Admitting: Family Medicine

## 2020-07-18 ENCOUNTER — Telehealth: Payer: Medicare PPO | Admitting: Family Medicine

## 2020-07-18 ENCOUNTER — Other Ambulatory Visit: Payer: Medicare PPO

## 2020-07-18 VITALS — BP 125/64 | Ht 63.0 in | Wt 142.0 lb

## 2020-07-18 DIAGNOSIS — R197 Diarrhea, unspecified: Secondary | ICD-10-CM

## 2020-07-18 MED ORDER — ONDANSETRON 4 MG PO TBDP: 4.0000 mg | ORAL_TABLET | Freq: Three times a day (TID) | ORAL | 0 refills | Status: DC | PRN

## 2020-07-18 NOTE — Progress Notes (Signed)
Virtual Visit via video Note  This visit type was conducted due to national recommendations for restrictions regarding the COVID-19 pandemic (e.g. social distancing).  This format is felt to be most appropriate for this patient at this time.  All issues noted in this document were discussed and addressed.  No physical exam was performed (except for noted visual exam findings with Video Visits).   I connected with Savannah Cox today at  8:00 AM EST by a video enabled telemedicine application and verified that I am speaking with the correct person using two identifiers. Location patient: home Location provider: work Persons participating in the virtual visit: patient, provider  I discussed the limitations, risks, security and privacy concerns of performing an evaluation and management service by telephone and the availability of in person appointments. I also discussed with the patient that there may be a patient responsible charge related to this service. The patient expressed understanding and agreed to proceed.  Reason for visit: same day  HPI: Diarrhea/vomiting: Patient notes onset of symptoms on 07/14/2020. Started with vomiting in the middle of the night and then developed vomiting and diarrhea. Notes no blood in her stool or vomit. She remains nauseous. She has not had any vomiting or diarrhea since yesterday. No abdominal pain. No fevers. No change to chronic cough. No congestion. No loss of taste or smell. No COVID-19 exposures. She has received her COVID-19 vaccine booster. She took a rapid Covid test yesterday that was negative. She notes she is urinating not quite as frequently though her urine is clear. She is trying to stay adequately hydrated.   ROS: See pertinent positives and negatives per HPI.  Past Medical History:  Diagnosis Date  . Allergic state   . Anemia   . Complication of anesthesia   . Diverticulosis   . GERD (gastroesophageal reflux disease)   . History of hiatal  hernia   . History of kidney stones   . Hypercholesterolemia   . Hypertension   . Hypothyroidism   . Osteoporosis, postmenopausal   . PONV (postoperative nausea and vomiting)    nauseated with colonoscopy  . Shingles     Past Surgical History:  Procedure Laterality Date  . BREAST BIOPSY Left 90s   benign  . COLONOSCOPY WITH PROPOFOL N/A 01/28/2016   Procedure: COLONOSCOPY WITH PROPOFOL;  Surgeon: Lollie Sails, MD;  Location: [facility] ENDOSCOPY;  Service: Endoscopy;  Laterality: N/A;  . EXCISION METACARPAL MASS Right 04/30/2017   Procedure: EXCISION MUCOID CYST FIFTH FINGER RIGHT HAND;  Surgeon: Hessie Knows, MD;  Location: [facility] ORS;  Service: Orthopedics;  Laterality: Right;  . EYE SURGERY Bilateral 2010   cataracts    Family History  Problem Relation Age of Onset  . Heart disease Father        died age 23 - myocardial infarction  . Hypercholesterolemia Mother   . Arthritis Mother   . Heart attack Mother   . Kidney cancer Other        aunt  . Breast cancer Maternal Aunt   . Colon cancer Neg Hx     SOCIAL HX: Former smoker   Current Outpatient Medications:  .  betamethasone dipropionate 0.05 % cream, Apply topically 2 (two) times daily as needed., Disp: 30 g, Rfl: 0 .  Fluticasone-Umeclidin-Vilant (TRELEGY ELLIPTA) 100-62.5-25 MCG/INH AEPB, Inhale 1 puff into the lungs daily., Disp: 60 each, Rfl: 11 .  levothyroxine (SYNTHROID) 75 MCG tablet, TAKE 1 TABLET BY MOUTH EVERY DAY, Disp: 90 tablet, Rfl: 0 .  lisinopril (ZESTRIL) 10 MG tablet, Take 1 tablet (10 mg total) by mouth daily., Disp: 90 tablet, Rfl: 1 .  loratadine (CLARITIN) 10 MG tablet, Take 10 mg by mouth daily. , Disp: , Rfl:  .  Multiple Vitamins-Minerals (CENTRUM SILVER ULTRA WOMENS PO), Take 1 tablet by mouth daily. , Disp: , Rfl:  .  omeprazole (PRILOSEC) 40 MG capsule, TAKE 1 CAPSULE (40 MG TOTAL) BY MOUTH ONCE DAILY TAKE 30 MINS BEFORE A MEAL, Disp: 90 capsule, Rfl: 0 .  ondansetron (ZOFRAN-ODT) 4 MG  disintegrating tablet, Take 1 tablet (4 mg total) by mouth every 8 (eight) hours as needed for nausea or vomiting., Disp: 20 tablet, Rfl: 0 .  rosuvastatin (CRESTOR) 5 MG tablet, TAKE 1 TABLET BY MOUTH EVERY DAY, Disp: 90 tablet, Rfl: 1 .  sertraline (ZOLOFT) 50 MG tablet, TAKE 1 TABLET BY MOUTH EVERY DAY, Disp: 90 tablet, Rfl: 3  EXAM:  VITALS per patient if applicable:  GENERAL: alert, oriented, appears well and in no acute distress  HEENT: atraumatic, conjunttiva clear, no obvious abnormalities on inspection of external nose and ears  NECK: normal movements of the head and neck  LUNGS: on inspection no signs of respiratory distress, breathing rate appears normal, no obvious gross SOB, gasping or wheezing  CV: no obvious cyanosis  MS: moves all visible extremities without noticeable abnormality  PSYCH/NEURO: pleasant and cooperative, no obvious depression or anxiety, speech and thought processing grossly intact  ASSESSMENT AND PLAN:  Discussed the following assessment and plan:  Problem List Items Addressed This Visit    Diarrhea - Primary    Suspect likely infectious diarrhea as the cause of her symptoms. She has been improving. We will get a Covid PCR to help rule that out as a potential cause. We will treat with Zofran to help with nausea. Discussed adequate hydration with water or noncaffeinated soda such as Sprite or ginger ale. She can also take in Gatorade if needed. Discussed seeking medical attention for worsening symptoms or if she develops blood in her stool, abdominal pain, or fevers. Discussed that I would expect her to continue to improve over the next several days.      Relevant Medications   ondansetron (ZOFRAN-ODT) 4 MG disintegrating tablet   Other Relevant Orders   Novel Coronavirus, NAA ([facility])       I discussed the assessment and treatment plan with the patient. The patient was provided an opportunity to ask questions and all were answered. The patient  agreed with the plan and demonstrated an understanding of the instructions.   The patient was advised to call back or seek an in-person evaluation if the symptoms worsen or if the condition fails to improve as anticipated.   Tommi Rumps, MD

## 2020-07-18 NOTE — Assessment & Plan Note (Signed)
Suspect likely infectious diarrhea as the cause of her symptoms. She has been improving. We will get a Covid PCR to help rule that out as a potential cause. We will treat with Zofran to help with nausea. Discussed adequate hydration with water or noncaffeinated soda such as Sprite or ginger ale. She can also take in Gatorade if needed. Discussed seeking medical attention for worsening symptoms or if she develops blood in her stool, abdominal pain, or fevers. Discussed that I would expect her to continue to improve over the next several days.

## 2020-07-19 LAB — SARS-COV-2, NAA 2 DAY TAT

## 2020-07-19 LAB — NOVEL CORONAVIRUS, NAA: SARS-CoV-2, NAA: NOT DETECTED

## 2020-08-01 ENCOUNTER — Ambulatory Visit
Admission: RE | Admit: 2020-08-01 | Discharge: 2020-08-01 | Disposition: A | Discharge status: Home / Self Care | Payer: Medicare PPO | Source: Ambulatory Visit | Attending: Internal Medicine | Admitting: Internal Medicine

## 2020-08-01 ENCOUNTER — Other Ambulatory Visit: Payer: Self-pay

## 2020-08-01 DIAGNOSIS — Z1231 Encounter for screening mammogram for malignant neoplasm of breast: Secondary | ICD-10-CM | POA: Insufficient documentation

## 2020-08-07 DIAGNOSIS — M9903 Segmental and somatic dysfunction of lumbar region: Secondary | ICD-10-CM | POA: Diagnosis not present

## 2020-08-07 DIAGNOSIS — M62830 Muscle spasm of back: Secondary | ICD-10-CM | POA: Diagnosis not present

## 2020-08-07 DIAGNOSIS — M5136 Other intervertebral disc degeneration, lumbar region: Secondary | ICD-10-CM | POA: Diagnosis not present

## 2020-08-07 DIAGNOSIS — M9905 Segmental and somatic dysfunction of pelvic region: Secondary | ICD-10-CM | POA: Diagnosis not present

## 2020-08-08 DIAGNOSIS — M9903 Segmental and somatic dysfunction of lumbar region: Secondary | ICD-10-CM | POA: Diagnosis not present

## 2020-08-08 DIAGNOSIS — M5136 Other intervertebral disc degeneration, lumbar region: Secondary | ICD-10-CM | POA: Diagnosis not present

## 2020-08-08 DIAGNOSIS — M9905 Segmental and somatic dysfunction of pelvic region: Secondary | ICD-10-CM | POA: Diagnosis not present

## 2020-08-08 DIAGNOSIS — M62830 Muscle spasm of back: Secondary | ICD-10-CM | POA: Diagnosis not present

## 2020-08-09 DIAGNOSIS — M5136 Other intervertebral disc degeneration, lumbar region: Secondary | ICD-10-CM | POA: Diagnosis not present

## 2020-08-09 DIAGNOSIS — M9903 Segmental and somatic dysfunction of lumbar region: Secondary | ICD-10-CM | POA: Diagnosis not present

## 2020-08-09 DIAGNOSIS — M9905 Segmental and somatic dysfunction of pelvic region: Secondary | ICD-10-CM | POA: Diagnosis not present

## 2020-08-09 DIAGNOSIS — M62830 Muscle spasm of back: Secondary | ICD-10-CM | POA: Diagnosis not present

## 2020-08-13 DIAGNOSIS — M5136 Other intervertebral disc degeneration, lumbar region: Secondary | ICD-10-CM | POA: Diagnosis not present

## 2020-08-13 DIAGNOSIS — M62830 Muscle spasm of back: Secondary | ICD-10-CM | POA: Diagnosis not present

## 2020-08-13 DIAGNOSIS — M9905 Segmental and somatic dysfunction of pelvic region: Secondary | ICD-10-CM | POA: Diagnosis not present

## 2020-08-13 DIAGNOSIS — M9903 Segmental and somatic dysfunction of lumbar region: Secondary | ICD-10-CM | POA: Diagnosis not present

## 2020-08-15 DIAGNOSIS — M9905 Segmental and somatic dysfunction of pelvic region: Secondary | ICD-10-CM | POA: Diagnosis not present

## 2020-08-15 DIAGNOSIS — M5136 Other intervertebral disc degeneration, lumbar region: Secondary | ICD-10-CM | POA: Diagnosis not present

## 2020-08-15 DIAGNOSIS — M9903 Segmental and somatic dysfunction of lumbar region: Secondary | ICD-10-CM | POA: Diagnosis not present

## 2020-08-15 DIAGNOSIS — M62830 Muscle spasm of back: Secondary | ICD-10-CM | POA: Diagnosis not present

## 2020-08-16 DIAGNOSIS — M9903 Segmental and somatic dysfunction of lumbar region: Secondary | ICD-10-CM | POA: Diagnosis not present

## 2020-08-16 DIAGNOSIS — M62830 Muscle spasm of back: Secondary | ICD-10-CM | POA: Diagnosis not present

## 2020-08-16 DIAGNOSIS — M5136 Other intervertebral disc degeneration, lumbar region: Secondary | ICD-10-CM | POA: Diagnosis not present

## 2020-08-16 DIAGNOSIS — M9905 Segmental and somatic dysfunction of pelvic region: Secondary | ICD-10-CM | POA: Diagnosis not present

## 2020-08-20 DIAGNOSIS — M9903 Segmental and somatic dysfunction of lumbar region: Secondary | ICD-10-CM | POA: Diagnosis not present

## 2020-08-20 DIAGNOSIS — M5136 Other intervertebral disc degeneration, lumbar region: Secondary | ICD-10-CM | POA: Diagnosis not present

## 2020-08-20 DIAGNOSIS — M9905 Segmental and somatic dysfunction of pelvic region: Secondary | ICD-10-CM | POA: Diagnosis not present

## 2020-08-20 DIAGNOSIS — M62830 Muscle spasm of back: Secondary | ICD-10-CM | POA: Diagnosis not present

## 2020-08-22 DIAGNOSIS — M62830 Muscle spasm of back: Secondary | ICD-10-CM | POA: Diagnosis not present

## 2020-08-22 DIAGNOSIS — M9905 Segmental and somatic dysfunction of pelvic region: Secondary | ICD-10-CM | POA: Diagnosis not present

## 2020-08-22 DIAGNOSIS — M5136 Other intervertebral disc degeneration, lumbar region: Secondary | ICD-10-CM | POA: Diagnosis not present

## 2020-08-22 DIAGNOSIS — M9903 Segmental and somatic dysfunction of lumbar region: Secondary | ICD-10-CM | POA: Diagnosis not present

## 2020-08-23 DIAGNOSIS — M62830 Muscle spasm of back: Secondary | ICD-10-CM | POA: Diagnosis not present

## 2020-08-23 DIAGNOSIS — M5136 Other intervertebral disc degeneration, lumbar region: Secondary | ICD-10-CM | POA: Diagnosis not present

## 2020-08-23 DIAGNOSIS — M9903 Segmental and somatic dysfunction of lumbar region: Secondary | ICD-10-CM | POA: Diagnosis not present

## 2020-08-23 DIAGNOSIS — M9905 Segmental and somatic dysfunction of pelvic region: Secondary | ICD-10-CM | POA: Diagnosis not present

## 2020-08-24 ENCOUNTER — Other Ambulatory Visit: Payer: Self-pay | Admitting: Internal Medicine

## 2020-08-28 DIAGNOSIS — M5136 Other intervertebral disc degeneration, lumbar region: Secondary | ICD-10-CM | POA: Diagnosis not present

## 2020-08-28 DIAGNOSIS — M9903 Segmental and somatic dysfunction of lumbar region: Secondary | ICD-10-CM | POA: Diagnosis not present

## 2020-08-28 DIAGNOSIS — M62830 Muscle spasm of back: Secondary | ICD-10-CM | POA: Diagnosis not present

## 2020-08-28 DIAGNOSIS — M9905 Segmental and somatic dysfunction of pelvic region: Secondary | ICD-10-CM | POA: Diagnosis not present

## 2020-08-30 DIAGNOSIS — M9903 Segmental and somatic dysfunction of lumbar region: Secondary | ICD-10-CM | POA: Diagnosis not present

## 2020-08-30 DIAGNOSIS — M5136 Other intervertebral disc degeneration, lumbar region: Secondary | ICD-10-CM | POA: Diagnosis not present

## 2020-08-30 DIAGNOSIS — M62830 Muscle spasm of back: Secondary | ICD-10-CM | POA: Diagnosis not present

## 2020-08-30 DIAGNOSIS — M9905 Segmental and somatic dysfunction of pelvic region: Secondary | ICD-10-CM | POA: Diagnosis not present

## 2020-09-04 DIAGNOSIS — M9905 Segmental and somatic dysfunction of pelvic region: Secondary | ICD-10-CM | POA: Diagnosis not present

## 2020-09-04 DIAGNOSIS — M62830 Muscle spasm of back: Secondary | ICD-10-CM | POA: Diagnosis not present

## 2020-09-04 DIAGNOSIS — M5136 Other intervertebral disc degeneration, lumbar region: Secondary | ICD-10-CM | POA: Diagnosis not present

## 2020-09-04 DIAGNOSIS — M9903 Segmental and somatic dysfunction of lumbar region: Secondary | ICD-10-CM | POA: Diagnosis not present

## 2020-09-06 DIAGNOSIS — M62830 Muscle spasm of back: Secondary | ICD-10-CM | POA: Diagnosis not present

## 2020-09-06 DIAGNOSIS — M5136 Other intervertebral disc degeneration, lumbar region: Secondary | ICD-10-CM | POA: Diagnosis not present

## 2020-09-06 DIAGNOSIS — M9903 Segmental and somatic dysfunction of lumbar region: Secondary | ICD-10-CM | POA: Diagnosis not present

## 2020-09-06 DIAGNOSIS — M9905 Segmental and somatic dysfunction of pelvic region: Secondary | ICD-10-CM | POA: Diagnosis not present

## 2020-09-11 DIAGNOSIS — M62830 Muscle spasm of back: Secondary | ICD-10-CM | POA: Diagnosis not present

## 2020-09-11 DIAGNOSIS — M5136 Other intervertebral disc degeneration, lumbar region: Secondary | ICD-10-CM | POA: Diagnosis not present

## 2020-09-11 DIAGNOSIS — M9903 Segmental and somatic dysfunction of lumbar region: Secondary | ICD-10-CM | POA: Diagnosis not present

## 2020-09-11 DIAGNOSIS — M9905 Segmental and somatic dysfunction of pelvic region: Secondary | ICD-10-CM | POA: Diagnosis not present

## 2020-09-13 DIAGNOSIS — M62830 Muscle spasm of back: Secondary | ICD-10-CM | POA: Diagnosis not present

## 2020-09-13 DIAGNOSIS — M5136 Other intervertebral disc degeneration, lumbar region: Secondary | ICD-10-CM | POA: Diagnosis not present

## 2020-09-13 DIAGNOSIS — M9903 Segmental and somatic dysfunction of lumbar region: Secondary | ICD-10-CM | POA: Diagnosis not present

## 2020-09-13 DIAGNOSIS — M9905 Segmental and somatic dysfunction of pelvic region: Secondary | ICD-10-CM | POA: Diagnosis not present

## 2020-09-19 ENCOUNTER — Other Ambulatory Visit: Payer: Self-pay | Admitting: Internal Medicine

## 2020-09-19 DIAGNOSIS — M9903 Segmental and somatic dysfunction of lumbar region: Secondary | ICD-10-CM | POA: Diagnosis not present

## 2020-09-19 DIAGNOSIS — M62830 Muscle spasm of back: Secondary | ICD-10-CM | POA: Diagnosis not present

## 2020-09-19 DIAGNOSIS — M5136 Other intervertebral disc degeneration, lumbar region: Secondary | ICD-10-CM | POA: Diagnosis not present

## 2020-09-19 DIAGNOSIS — M9905 Segmental and somatic dysfunction of pelvic region: Secondary | ICD-10-CM | POA: Diagnosis not present

## 2020-09-26 DIAGNOSIS — M5136 Other intervertebral disc degeneration, lumbar region: Secondary | ICD-10-CM | POA: Diagnosis not present

## 2020-09-26 DIAGNOSIS — M62830 Muscle spasm of back: Secondary | ICD-10-CM | POA: Diagnosis not present

## 2020-09-26 DIAGNOSIS — M9903 Segmental and somatic dysfunction of lumbar region: Secondary | ICD-10-CM | POA: Diagnosis not present

## 2020-09-26 DIAGNOSIS — M9905 Segmental and somatic dysfunction of pelvic region: Secondary | ICD-10-CM | POA: Diagnosis not present

## 2020-10-03 DIAGNOSIS — M9905 Segmental and somatic dysfunction of pelvic region: Secondary | ICD-10-CM | POA: Diagnosis not present

## 2020-10-03 DIAGNOSIS — M62830 Muscle spasm of back: Secondary | ICD-10-CM | POA: Diagnosis not present

## 2020-10-03 DIAGNOSIS — M9903 Segmental and somatic dysfunction of lumbar region: Secondary | ICD-10-CM | POA: Diagnosis not present

## 2020-10-03 DIAGNOSIS — M5136 Other intervertebral disc degeneration, lumbar region: Secondary | ICD-10-CM | POA: Diagnosis not present

## 2020-10-09 ENCOUNTER — Other Ambulatory Visit (INDEPENDENT_AMBULATORY_CARE_PROVIDER_SITE_OTHER): Payer: Medicare PPO

## 2020-10-09 ENCOUNTER — Other Ambulatory Visit: Payer: Self-pay

## 2020-10-09 DIAGNOSIS — E7800 Pure hypercholesterolemia, unspecified: Secondary | ICD-10-CM | POA: Diagnosis not present

## 2020-10-09 DIAGNOSIS — I10 Essential (primary) hypertension: Secondary | ICD-10-CM

## 2020-10-09 DIAGNOSIS — E039 Hypothyroidism, unspecified: Secondary | ICD-10-CM | POA: Diagnosis not present

## 2020-10-09 LAB — HEPATIC FUNCTION PANEL
ALT: 23 U/L (ref 0–35)
AST: 34 U/L (ref 0–37)
Albumin: 3.9 g/dL (ref 3.5–5.2)
Alkaline Phosphatase: 45 U/L (ref 39–117)
Bilirubin, Direct: 0.1 mg/dL (ref 0.0–0.3)
Total Bilirubin: 0.5 mg/dL (ref 0.2–1.2)
Total Protein: 6.6 g/dL (ref 6.0–8.3)

## 2020-10-09 LAB — BASIC METABOLIC PANEL
BUN: 15 mg/dL (ref 6–23)
CO2: 27 mEq/L (ref 19–32)
Calcium: 9.3 mg/dL (ref 8.4–10.5)
Chloride: 106 mEq/L (ref 96–112)
Creatinine, Ser: 0.76 mg/dL (ref 0.40–1.20)
GFR: 78.04 mL/min (ref >60.00)
Glucose, Bld: 92 mg/dL (ref 70–99)
Potassium: 3.9 mEq/L (ref 3.5–5.1)
Sodium: 140 mEq/L (ref 135–145)

## 2020-10-09 LAB — LIPID PANEL
Cholesterol: 191 mg/dL (ref 0–200)
HDL: 76.1 mg/dL (ref >39.00)
LDL Cholesterol: 100 mg/dL — ABNORMAL HIGH (ref 0–99)
NonHDL: 115.3
Total CHOL/HDL Ratio: 3
Triglycerides: 75 mg/dL (ref 0.0–149.0)
VLDL: 15 mg/dL (ref 0.0–40.0)

## 2020-10-09 LAB — TSH: TSH: 0.65 u[IU]/mL (ref 0.35–4.50)

## 2020-10-11 ENCOUNTER — Telehealth: Payer: Self-pay | Admitting: Internal Medicine

## 2020-10-11 ENCOUNTER — Other Ambulatory Visit: Payer: Self-pay

## 2020-10-11 ENCOUNTER — Ambulatory Visit (INDEPENDENT_AMBULATORY_CARE_PROVIDER_SITE_OTHER): Payer: Medicare PPO | Admitting: Internal Medicine

## 2020-10-11 VITALS — BP 110/64 | HR 88 | Temp 97.4°F | Resp 16 | Ht 63.0 in | Wt 139.0 lb

## 2020-10-11 DIAGNOSIS — I700 Atherosclerosis of aorta: Secondary | ICD-10-CM | POA: Diagnosis not present

## 2020-10-11 DIAGNOSIS — D649 Anemia, unspecified: Secondary | ICD-10-CM

## 2020-10-11 DIAGNOSIS — E7800 Pure hypercholesterolemia, unspecified: Secondary | ICD-10-CM | POA: Diagnosis not present

## 2020-10-11 DIAGNOSIS — Z0000 Encounter for general adult medical examination without abnormal findings: Secondary | ICD-10-CM | POA: Diagnosis not present

## 2020-10-11 DIAGNOSIS — I10 Essential (primary) hypertension: Secondary | ICD-10-CM | POA: Diagnosis not present

## 2020-10-11 DIAGNOSIS — J432 Centrilobular emphysema: Secondary | ICD-10-CM

## 2020-10-11 DIAGNOSIS — M810 Age-related osteoporosis without current pathological fracture: Secondary | ICD-10-CM | POA: Diagnosis not present

## 2020-10-11 DIAGNOSIS — E039 Hypothyroidism, unspecified: Secondary | ICD-10-CM

## 2020-10-11 DIAGNOSIS — M545 Low back pain, unspecified: Secondary | ICD-10-CM | POA: Diagnosis not present

## 2020-10-11 DIAGNOSIS — K219 Gastro-esophageal reflux disease without esophagitis: Secondary | ICD-10-CM

## 2020-10-11 DIAGNOSIS — M549 Dorsalgia, unspecified: Secondary | ICD-10-CM | POA: Insufficient documentation

## 2020-10-11 MED ORDER — ROSUVASTATIN CALCIUM 5 MG PO TABS: ORAL_TABLET | ORAL | 1 refills | Status: DC

## 2020-10-11 NOTE — Telephone Encounter (Signed)
PT would like to know if omeprazole will be called in from today visit. She saw that it was not on the sheet and was wondering about that.

## 2020-10-11 NOTE — Progress Notes (Signed)
Patient ID: Savannah Cox, female   DOB: December 28, 1945, 75 y.o.   MRN: [id number]   Subjective:    Patient ID: Savannah Cox, female    DOB: Nov 07, 1945, 75 y.o.   MRN: [id number]  HPI This visit occurred during the SARS-CoV-2 public health emergency.  Safety protocols were in place, including screening questions prior to the visit, additional usage of staff PPE, and extensive cleaning of exam room while observing appropriate contact time as indicated for disinfecting solutions.  Patient here for her physical exam. She reports she is doing relatively well.  Does report low back pain.  Saw chiropractor.  Discussed - pelvic floor PT.   Tries to stay active.  No chest pain or sob with increased activity or exertion.  No abdominal pain.  Bowels moving.  Discussed labs.    Past Medical History:  Diagnosis Date  . Allergic state   . Anemia   . Complication of anesthesia   . Diverticulosis   . GERD (gastroesophageal reflux disease)   . History of hiatal hernia   . History of kidney stones   . Hypercholesterolemia   . Hypertension   . Hypothyroidism   . Osteoporosis, postmenopausal   . PONV (postoperative nausea and vomiting)    nauseated with colonoscopy  . Shingles    Past Surgical History:  Procedure Laterality Date  . BREAST BIOPSY Left 90s   benign  . COLONOSCOPY WITH PROPOFOL N/A 01/28/2016   Procedure: COLONOSCOPY WITH PROPOFOL;  Surgeon: Lollie Sails, MD;  Location: [facility] END[facility];  Service: Endoscopy;  Laterality: N/A;  . EXCISION METACARPAL MASS Right 04/30/2017   Procedure: EXCISION MUCOID CYST FIFTH FINGER RIGHT HAND;  Surgeon: Hessie Knows, MD;  Location: [facility] ORS;  Service: Orthopedics;  Laterality: Right;  . EYE SURGERY Bilateral 2010   cataracts   Family History  Problem Relation Age of Onset  . Heart disease Father        died age 96 - myocardial infarction  . Hypercholesterolemia Mother   . Arthritis Mother   . Heart attack Mother   . Kidney cancer Other         aunt  . Breast cancer Maternal Aunt   . Colon cancer Neg Hx    Social History   Socioeconomic History  . Marital status: Married    Spouse name: Not on file  . Number of children: 3  . Years of education: 44  . Highest education level: Not on file  Occupational History  . Occupation: retired Product manager: RETIRED  Tobacco Use  . Smoking status: Former Smoker    Packs/day: 1.50    Years: 30.00    Pack years: 45.00    Types: Cigarettes    Quit date: 1990    Years since quitting: 32.3  . Smokeless tobacco: Never Used  Vaping Use  . Vaping Use: Never used  Substance and Sexual Activity  . Alcohol use: Yes    Alcohol/week: 0.0 standard drinks    Comment: Rare drink, one drink per month  . Drug use: No  . Sexual activity: Yes  Other Topics Concern  . Not on file  Social History Narrative  . Not on file   Social Determinants of Health   Financial Resource Strain: Not on file  Food Insecurity: Not on file  Transportation Needs: Not on file  Physical Activity: Not on file  Stress: Not on file  Social Connections: Not on file    Outpatient Encounter Medications as  of 10/11/2020  Medication Sig  . betamethasone dipropionate 0.05 % cream Apply topically 2 (two) times daily as needed.  . Fluticasone-Umeclidin-Vilant (TRELEGY ELLIPTA) 100-62.5-25 MCG/INH AEPB Inhale 1 puff into the lungs daily.  Marland Kitchen levothyroxine (SYNTHROID) 75 MCG tablet TAKE 1 TABLET BY MOUTH EVERY DAY  . lisinopril (ZESTRIL) 10 MG tablet Take 1 tablet (10 mg total) by mouth daily.  Marland Kitchen loratadine (CLARITIN) 10 MG tablet Take 10 mg by mouth daily.   . Multiple Vitamins-Minerals (CENTRUM SILVER ULTRA WOMENS PO) Take 1 tablet by mouth daily.   . rosuvastatin (CRESTOR) 5 MG tablet TAKE 2 TABLETS BY MOUTH ON Monday, Wednesday and Friday. TAKE 1 TABLET ON ALL OTHER DAYS  . sertraline (ZOLOFT) 50 MG tablet TAKE 1 TABLET BY MOUTH EVERY DAY  . [DISCONTINUED] omeprazole (PRILOSEC) 40 MG capsule TAKE 1  CAPSULE (40 MG TOTAL) BY MOUTH ONCE DAILY TAKE 30 MINS BEFORE A MEAL  . [DISCONTINUED] ondansetron (ZOFRAN-ODT) 4 MG disintegrating tablet Take 1 tablet (4 mg total) by mouth every 8 (eight) hours as needed for nausea or vomiting.  . [DISCONTINUED] rosuvastatin (CRESTOR) 5 MG tablet TAKE 2 TABLETS BY MOUTH ON MONDAY AND THURSDAY. TAKE 1 TABLET ON ALL OTHER DAYS   No facility-administered encounter medications on file as of 10/11/2020.    Review of Systems  Constitutional: Negative for appetite change and unexpected weight change.  HENT: Negative for congestion, sinus pressure and sore throat.   Eyes: Negative for pain and visual disturbance.  Respiratory: Negative for cough, chest tightness and shortness of breath.   Cardiovascular: Negative for chest pain, palpitations and leg swelling.  Gastrointestinal: Negative for abdominal pain, diarrhea, nausea and vomiting.  Genitourinary: Negative for difficulty urinating and dysuria.  Musculoskeletal: Positive for back pain. Negative for joint swelling and myalgias.  Skin: Negative for color change and rash.  Neurological: Negative for dizziness, light-headedness and headaches.  Hematological: Negative for adenopathy. Does not bruise/bleed easily.  Psychiatric/Behavioral: Negative for agitation and dysphoric mood.       Objective:    Physical Exam Vitals reviewed.  Constitutional:      General: She is not in acute distress.    Appearance: Normal appearance. She is well-developed.  HENT:     Head: Normocephalic and atraumatic.     Right Ear: External ear normal.     Left Ear: External ear normal.  Eyes:     General: No scleral icterus.       Right eye: No discharge.        Left eye: No discharge.     Conjunctiva/sclera: Conjunctivae normal.  Neck:     Thyroid: No thyromegaly.  Cardiovascular:     Rate and Rhythm: Normal rate and regular rhythm.  Pulmonary:     Effort: No tachypnea, accessory muscle usage or respiratory distress.      Breath sounds: Normal breath sounds. No decreased breath sounds or wheezing.  Chest:  Breasts:     Right: No inverted nipple, mass, nipple discharge or tenderness (no axillary adenopathy).     Left: No inverted nipple, mass, nipple discharge or tenderness (no axilarry adenopathy).    Abdominal:     General: Bowel sounds are normal.     Palpations: Abdomen is soft.     Tenderness: There is no abdominal tenderness.  Musculoskeletal:        General: No swelling or tenderness.     Cervical back: Neck supple. No tenderness.  Lymphadenopathy:     Cervical: No cervical adenopathy.  Skin:  Findings: No erythema or rash.  Neurological:     Mental Status: She is alert and oriented to person, place, and time.  Psychiatric:        Mood and Affect: Mood normal.        Behavior: Behavior normal.     BP 110/64   Pulse 88   Temp (!) 97.4 F (36.3 C)   Resp 16   Ht 5\' 3"  (1.6 m)   Wt 139 lb (63 kg)   SpO2 97%   BMI 24.62 kg/m  Wt Readings from Last 3 Encounters:  10/11/20 139 lb (63 kg)  07/18/20 142 lb (64.4 kg)  07/12/20 142 lb 6.4 oz (64.6 kg)     Lab Results  Component Value Date   WBC 5.0 01/05/2020   HGB 13.1 01/05/2020   HCT 38.7 01/05/2020   PLT 236.0 01/05/2020   GLUCOSE 92 10/09/2020   CHOL 191 10/09/2020   TRIG 75.0 10/09/2020   HDL 76.10 10/09/2020   LDLDIRECT 165.9 05/11/2013   LDLCALC 100 (H) 10/09/2020   ALT 23 10/09/2020   AST 34 10/09/2020   NA 140 10/09/2020   K 3.9 10/09/2020   CL 106 10/09/2020   CREATININE 0.76 10/09/2020   BUN 15 10/09/2020   CO2 27 10/09/2020   TSH 0.65 10/09/2020   INR 0.96 04/14/2016    MM 3D SCREEN BREAST BILATERAL  Result Date: 08/06/2020 CLINICAL DATA:  Screening. EXAM: DIGITAL SCREENING BILATERAL MAMMOGRAM WITH TOMOSYNTHESIS AND CAD TECHNIQUE: Bilateral screening digital craniocaudal and mediolateral oblique mammograms were obtained. Bilateral screening digital breast tomosynthesis was performed. The images were  evaluated with computer-aided detection. COMPARISON:  Previous exam(s). ACR Breast Density Category b: There are scattered areas of fibroglandular density. FINDINGS: There are no findings suspicious for malignancy. IMPRESSION: No mammographic evidence of malignancy. A result letter of this screening mammogram will be mailed directly to the patient. RECOMMENDATION: Screening mammogram in one year. (Code:SM-B-01Y) BI-RADS CATEGORY  1: Negative. Electronically Signed   By: Franki Cabot M.D.   On: 08/06/2020 11:59       Assessment & Plan:   Problem List Items Addressed This Visit    Anemia    Follow cbc.       Aortic atherosclerosis (HCC)    Continue crestor.       Relevant Medications   rosuvastatin (CRESTOR) 5 MG tablet   Back pain    Saw chiropractor.  Discussed pelvic floor PT.  Will let me know if desires any further intervention.        Relevant Orders   Urinalysis, Routine w reflex microscopic (Completed)   Urine Culture (Completed)   Centrilobular emphysema ([place])    Evaluated by pulmonary.  Breathing stable.  Continue trelegy.        GERD (gastroesophageal reflux disease)    No upper symptoms reported.  Continue prilosec.       Health care maintenance    Physical today 10/11/20.  PAP 04/2017 - negative with negative HPV. Mammogram 08/06/20 - birads I.  Colonoscopy 2021. Needs results.        Hypercholesterolemia    Continue crestor. Discussed increasing dose.  Low cholesterol diet and exercise.  Follow lipid panel and liver function tests.        Relevant Medications   rosuvastatin (CRESTOR) 5 MG tablet   Other Relevant Orders   Hepatic function panel   Lipid panel   Hypertension    Continue lisinopril.  Blood pressure doing well.  Follow pressure.  Follow metabolic panel.       Relevant Medications   rosuvastatin (CRESTOR) 5 MG tablet   Other Relevant Orders   CBC with Differential/Platelet   Basic metabolic panel   Hypothyroidism    On thyroid replacement.   Follow tsh.        Osteoporosis    Seeing endocrinology.  Reclast.        Other Visit Diagnoses    Routine general medical examination at a health care facility    -  Primary       Einar Pheasant, MD

## 2020-10-12 ENCOUNTER — Other Ambulatory Visit: Payer: Self-pay

## 2020-10-12 LAB — URINALYSIS, ROUTINE W REFLEX MICROSCOPIC
Bilirubin Urine: NEGATIVE
Ketones, ur: NEGATIVE
Leukocytes,Ua: NEGATIVE
Nitrite: NEGATIVE
Specific Gravity, Urine: 1.015 (ref 1.000–1.030)
Total Protein, Urine: NEGATIVE
Urine Glucose: NEGATIVE
Urobilinogen, UA: 0.2 (ref 0.0–1.0)
WBC, UA: NONE SEEN (ref 0–?)
pH: 7 (ref 5.0–8.0)

## 2020-10-12 LAB — URINE CULTURE
MICRO NUMBER:: 11826513
Result:: NO GROWTH
SPECIMEN QUALITY:: ADEQUATE

## 2020-10-12 MED ORDER — OMEPRAZOLE 40 MG PO CPDR: DELAYED_RELEASE_CAPSULE | ORAL | 2 refills | Status: DC

## 2020-10-12 NOTE — Telephone Encounter (Signed)
Rx refilled  Patient aware  

## 2020-10-17 DIAGNOSIS — M62830 Muscle spasm of back: Secondary | ICD-10-CM | POA: Diagnosis not present

## 2020-10-17 DIAGNOSIS — M9903 Segmental and somatic dysfunction of lumbar region: Secondary | ICD-10-CM | POA: Diagnosis not present

## 2020-10-17 DIAGNOSIS — M5136 Other intervertebral disc degeneration, lumbar region: Secondary | ICD-10-CM | POA: Diagnosis not present

## 2020-10-17 DIAGNOSIS — M9905 Segmental and somatic dysfunction of pelvic region: Secondary | ICD-10-CM | POA: Diagnosis not present

## 2020-10-21 ENCOUNTER — Encounter: Payer: Self-pay | Admitting: Internal Medicine

## 2020-10-21 NOTE — Assessment & Plan Note (Signed)
On thyroid replacement.  Follow tsh.  

## 2020-10-21 NOTE — Assessment & Plan Note (Signed)
Follow cbc.  

## 2020-10-21 NOTE — Assessment & Plan Note (Signed)
Seeing endocrinology.  Reclast.   

## 2020-10-21 NOTE — Assessment & Plan Note (Signed)
Evaluated by pulmonary.  Breathing stable.  Continue trelegy.

## 2020-10-21 NOTE — Assessment & Plan Note (Addendum)
Physical today 10/11/20.  PAP 04/2017 - negative with negative HPV. Mammogram 08/06/20 - birads I.  Colonoscopy 2021. Needs results.

## 2020-10-21 NOTE — Assessment & Plan Note (Signed)
Continue crestor. Discussed increasing dose.  Low cholesterol diet and exercise.  Follow lipid panel and liver function tests.

## 2020-10-21 NOTE — Assessment & Plan Note (Signed)
No upper symptoms reported.  Continue prilosec.  

## 2020-10-21 NOTE — Assessment & Plan Note (Signed)
Continue lisinopril.  Blood pressure doing well.  Follow pressure.  Follow metabolic panel.  

## 2020-10-21 NOTE — Assessment & Plan Note (Signed)
Continue crestor 

## 2020-10-21 NOTE — Assessment & Plan Note (Signed)
Saw chiropractor.  Discussed pelvic floor PT.  Will let me know if desires any further intervention.

## 2020-10-29 ENCOUNTER — Ambulatory Visit (INDEPENDENT_AMBULATORY_CARE_PROVIDER_SITE_OTHER): Payer: Medicare PPO

## 2020-10-29 VITALS — Ht 63.0 in | Wt 139.0 lb

## 2020-10-29 DIAGNOSIS — Z0000 Encounter for general adult medical examination without abnormal findings: Secondary | ICD-10-CM | POA: Diagnosis not present

## 2020-10-29 NOTE — Patient Instructions (Addendum)
Savannah Cox , Thank you for taking time to come for your Medicare Wellness Visit. I appreciate your ongoing commitment to your health goals. Please review the following plan we discussed and let me know if I can assist you in the future.   These are the goals we discussed: Goals    . Healthy Lifestyle     Healthy diet Stay active Stay hydrated Maintain weight       This is a list of the screening recommended for you and due dates:  Health Maintenance  Topic Date Due  . COVID-19 Vaccine (4 - Booster for [place] series) 07/06/2020  . Tetanus Vaccine  12/08/2020  . Flu Shot  01/14/2021  . Colon Cancer Screening  01/27/2021  . Mammogram  08/01/2021  . DEXA scan (bone density measurement)  Completed  . Hepatitis C Screening: USPSTF Recommendation to screen - Ages 14-79 yo.  Completed  . Pneumonia vaccines  Completed  . HPV Vaccine  Aged Out    Immunizations Immunization History  Administered Date(s) Administered  . DTaP 12/09/2010  . Influenza Split 04/29/2012  . Influenza, High Dose Seasonal PF 02/19/2016, 03/19/2017, 06/03/2018, 02/28/2020  . Influenza,inj,Quad PF,6+ Mos 02/21/2013, 02/13/2014, 05/14/2015  . Influenza-Unspecified 02/16/2019  . PFIZER(Purple Top)SARS-COV-2 Vaccination 07/31/2019, 08/23/2019, 04/05/2020  . Pneumococcal Conjugate-13 04/28/2013  . Pneumococcal Polysaccharide-23 02/13/2014  . Zoster 10/12/2011  . Zoster Recombinat (Shingrix) 02/01/2019, 05/16/2019   Keep all routine maintenance appointments.   Next scheduled lab 02/12/21   Follow up 02/14/21  Advanced directives: not yet completed.   Conditions/risks identified: none new.   Follow up in one year for your annual wellness visit.   Preventive Care 73 Years and Older, Female Preventive care refers to lifestyle choices and visits with your health care provider that can promote health and wellness. What does preventive care include?  A yearly physical exam. This is also called an annual well  check.  Dental exams once or twice a year.  Routine eye exams. Ask your health care provider how often you should have your eyes checked.  Personal lifestyle choices, including:  Daily care of your teeth and gums.  Regular physical activity.  Eating a healthy diet.  Avoiding tobacco and drug use.  Limiting alcohol use.  Practicing safe sex.  Taking low-dose aspirin every day.  Taking vitamin and mineral supplements as recommended by your health care provider. What happens during an annual well check? The services and screenings done by your health care provider during your annual well check will depend on your age, overall health, lifestyle risk factors, and family history of disease. Counseling  Your health care provider may ask you questions about your:  Alcohol use.  Tobacco use.  Drug use.  Emotional well-being.  Home and relationship well-being.  Sexual activity.  Eating habits.  History of falls.  Memory and ability to understand (cognition).  Work and work Statistician.  Reproductive health. Screening  You may have the following tests or measurements:  Height, weight, and BMI.  Blood pressure.  Lipid and cholesterol levels. These may be checked every 5 years, or more frequently if you are over 41 years old.  Skin check.  Lung cancer screening. You may have this screening every year starting at age 44 if you have a 30-pack-year history of smoking and currently smoke or have quit within the past 15 years.  Fecal occult blood test (FOBT) of the stool. You may have this test every year starting at age 67.  Flexible sigmoidoscopy or  colonoscopy. You may have a sigmoidoscopy every 5 years or a colonoscopy every 10 years starting at age 46.  Hepatitis C blood test.  Hepatitis B blood test.  Sexually transmitted disease (STD) testing.  Diabetes screening. This is done by checking your blood sugar (glucose) after you have not eaten for a while  (fasting). You may have this done every 1-3 years.  Bone density scan. This is done to screen for osteoporosis. You may have this done starting at age 71.  Mammogram. This may be done every 1-2 years. Talk to your health care provider about how often you should have regular mammograms. Talk with your health care provider about your test results, treatment options, and if necessary, the need for more tests. Vaccines  Your health care provider may recommend certain vaccines, such as:  Influenza vaccine. This is recommended every year.  Tetanus, diphtheria, and acellular pertussis (Tdap, Td) vaccine. You may need a Td booster every 10 years.  Zoster vaccine. You may need this after age 79.  Pneumococcal 13-valent conjugate (PCV13) vaccine. One dose is recommended after age 48.  Pneumococcal polysaccharide (PPSV23) vaccine. One dose is recommended after age 26. Talk to your health care provider about which screenings and vaccines you need and how often you need them. This information is not intended to replace advice given to you by your health care provider. Make sure you discuss any questions you have with your health care provider. Document Released: 06/29/2015 Document Revised: 02/20/2016 Document Reviewed: 04/03/2015 Elsevier Interactive Patient Education  2017 [place] Prevention in the Home Falls can cause injuries. They can happen to people of all ages. There are many things you can do to make your home safe and to help prevent falls. What can I do on the outside of my home?  Regularly fix the edges of walkways and driveways and fix any cracks.  Remove anything that might make you trip as you walk through a door, such as a raised step or threshold.  Trim any bushes or trees on the path to your home.  Use bright outdoor lighting.  Clear any walking paths of anything that might make someone trip, such as rocks or tools.  Regularly check to see if handrails are loose  or broken. Make sure that both sides of any steps have handrails.  Any raised decks and porches should have guardrails on the edges.  Have any leaves, snow, or ice cleared regularly.  Use sand or salt on walking paths during winter.  Clean up any spills in your garage right away. This includes oil or grease spills. What can I do in the bathroom?  Use night lights.  Install grab bars by the toilet and in the tub and shower. Do not use towel bars as grab bars.  Use non-skid mats or decals in the tub or shower.  If you need to sit down in the shower, use a plastic, non-slip stool.  Keep the floor dry. Clean up any water that spills on the floor as soon as it happens.  Remove soap buildup in the tub or shower regularly.  Attach bath mats securely with double-sided non-slip rug tape.  Do not have throw rugs and other things on the floor that can make you trip. What can I do in the bedroom?  Use night lights.  Make sure that you have a light by your bed that is easy to reach.  Do not use any sheets or blankets that are too  big for your bed. They should not hang down onto the floor.  Have a firm chair that has side arms. You can use this for support while you get dressed.  Do not have throw rugs and other things on the floor that can make you trip. What can I do in the kitchen?  Clean up any spills right away.  Avoid walking on wet floors.  Keep items that you use a lot in easy-to-reach places.  If you need to reach something above you, use a strong step stool that has a grab bar.  Keep electrical cords out of the way.  Do not use floor polish or wax that makes floors slippery. If you must use wax, use non-skid floor wax.  Do not have throw rugs and other things on the floor that can make you trip. What can I do with my stairs?  Do not leave any items on the stairs.  Make sure that there are handrails on both sides of the stairs and use them. Fix handrails that are  broken or loose. Make sure that handrails are as long as the stairways.  Check any carpeting to make sure that it is firmly attached to the stairs. Fix any carpet that is loose or worn.  Avoid having throw rugs at the top or bottom of the stairs. If you do have throw rugs, attach them to the floor with carpet tape.  Make sure that you have a light switch at the top of the stairs and the bottom of the stairs. If you do not have them, ask someone to add them for you. What else can I do to help prevent falls?  Wear shoes that:  Do not have high heels.  Have rubber bottoms.  Are comfortable and fit you well.  Are closed at the toe. Do not wear sandals.  If you use a stepladder:  Make sure that it is fully opened. Do not climb a closed stepladder.  Make sure that both sides of the stepladder are locked into place.  Ask someone to hold it for you, if possible.  Clearly mark and make sure that you can see:  Any grab bars or handrails.  First and last steps.  Where the edge of each step is.  Use tools that help you move around (mobility aids) if they are needed. These include:  Canes.  Walkers.  Scooters.  Crutches.  Turn on the lights when you go into a dark area. Replace any light bulbs as soon as they burn out.  Set up your furniture so you have a clear path. Avoid moving your furniture around.  If any of your floors are uneven, fix them.  If there are any pets around you, be aware of where they are.  Review your medicines with your doctor. Some medicines can make you feel dizzy. This can increase your chance of falling. Ask your doctor what other things that you can do to help prevent falls. This information is not intended to replace advice given to you by your health care provider. Make sure you discuss any questions you have with your health care provider. Document Released: 03/29/2009 Document Revised: 11/08/2015 Document Reviewed: 07/07/2014 Elsevier  Interactive Patient Education  2017 [employer].

## 2020-10-29 NOTE — Progress Notes (Signed)
Subjective:   Mija Anglade is a 75 y.o. female who presents for Medicare Annual (Subsequent) preventive examination.  Review of Systems    No ROS.  Medicare Wellness Virtual Visit.  Visual/audio telehealth visit, UTA vital signs.   See social history for additional risk factors.   Cardiac Risk Factors include: advanced age (>42men, >59 women);hypertension     Objective:    Today's Vitals   10/29/20 1031  Weight: 139 lb (63 kg)  Height: 5\' 3"  (1.6 m)   Body mass index is 24.62 kg/m.  Advanced Directives 10/29/2020 10/27/2019 08/16/2018 03/22/2018 04/30/2017 04/20/2017 03/19/2017  Does Patient Have a Medical Advance Directive? No No No No No No No  Type of Advance Directive - - - - - - -  Copy of Healthcare Power of Attorney in Chart? - - - - - - -  Would patient like information on creating a medical advance directive? No - Patient declined Yes (MAU/Ambulatory/Procedural Areas - Information given) No - Patient declined No - Patient declined No - Patient declined No - Patient declined No - Patient declined    Current Medications (verified) Outpatient Encounter Medications as of 10/29/2020  Medication Sig  . betamethasone dipropionate 0.05 % cream Apply topically 2 (two) times daily as needed.  . Fluticasone-Umeclidin-Vilant (TRELEGY ELLIPTA) 100-62.5-25 MCG/INH AEPB Inhale 1 puff into the lungs daily.  Marland Kitchen levothyroxine (SYNTHROID) 75 MCG tablet TAKE 1 TABLET BY MOUTH EVERY DAY  . lisinopril (ZESTRIL) 10 MG tablet Take 1 tablet (10 mg total) by mouth daily.  Marland Kitchen loratadine (CLARITIN) 10 MG tablet Take 10 mg by mouth daily.   . Multiple Vitamins-Minerals (CENTRUM SILVER ULTRA WOMENS PO) Take 1 tablet by mouth daily.   Marland Kitchen omeprazole (PRILOSEC) 40 MG capsule TAKE 1 CAPSULE (40 MG TOTAL) BY MOUTH ONCE DAILY TAKE 30 MINS BEFORE A MEAL  . rosuvastatin (CRESTOR) 5 MG tablet TAKE 2 TABLETS BY MOUTH ON Monday, Wednesday and Friday. TAKE 1 TABLET ON ALL OTHER DAYS  . sertraline (ZOLOFT) 50 MG  tablet TAKE 1 TABLET BY MOUTH EVERY DAY   No facility-administered encounter medications on file as of 10/29/2020.    Allergies (verified) Augmentin [amoxicillin-pot clavulanate]   History: Past Medical History:  Diagnosis Date  . Allergic state   . Anemia   . Complication of anesthesia   . Diverticulosis   . GERD (gastroesophageal reflux disease)   . History of hiatal hernia   . History of kidney stones   . Hypercholesterolemia   . Hypertension   . Hypothyroidism   . Osteoporosis, postmenopausal   . PONV (postoperative nausea and vomiting)    nauseated with colonoscopy  . Shingles    Past Surgical History:  Procedure Laterality Date  . BREAST BIOPSY Left 90s   benign  . COLONOSCOPY WITH PROPOFOL N/A 01/28/2016   Procedure: COLONOSCOPY WITH PROPOFOL;  Surgeon: Lollie Sails, MD;  Location: [facility] ENDOSCOPY;  Service: Endoscopy;  Laterality: N/A;  . EXCISION METACARPAL MASS Right 04/30/2017   Procedure: EXCISION MUCOID CYST FIFTH FINGER RIGHT HAND;  Surgeon: Hessie Knows, MD;  Location: [facility] ORS;  Service: Orthopedics;  Laterality: Right;  . EYE SURGERY Bilateral 2010   cataracts   Family History  Problem Relation Age of Onset  . Heart disease Father        died age 1 - myocardial infarction  . Hypercholesterolemia Mother   . Arthritis Mother   . Heart attack Mother   . Kidney cancer Other  aunt  . Breast cancer Maternal Aunt   . Colon cancer Neg Hx    Social History   Socioeconomic History  . Marital status: Married    Spouse name: Not on file  . Number of children: 3  . Years of education: 79  . Highest education level: Not on file  Occupational History  . Occupation: retired Product manager: RETIRED  Tobacco Use  . Smoking status: Former Smoker    Packs/day: 1.50    Years: 30.00    Pack years: 45.00    Types: Cigarettes    Quit date: 1990    Years since quitting: 32.3  . Smokeless tobacco: Never Used  Vaping Use  . Vaping Use: Never  used  Substance and Sexual Activity  . Alcohol use: Yes    Alcohol/week: 0.0 standard drinks    Comment: Rare drink, one drink per month  . Drug use: No  . Sexual activity: Yes  Other Topics Concern  . Not on file  Social History Narrative  . Not on file   Social Determinants of Health   Financial Resource Strain: Low Risk   . Difficulty of Paying Living Expenses: Not hard at all  Food Insecurity: No Food Insecurity  . Worried About Charity fundraiser in the Last Year: Never true  . Ran Out of Food in the Last Year: Never true  Transportation Needs: No Transportation Needs  . Lack of Transportation (Medical): No  . Lack of Transportation (Non-Medical): No  Physical Activity: Sufficiently Active  . Days of Exercise per Week: 3 days  . Minutes of Exercise per Session: 60 min  Stress: No Stress Concern Present  . Feeling of Stress : Not at all  Social Connections: Unknown  . Frequency of Communication with Friends and Family: More than three times a week  . Frequency of Social Gatherings with Friends and Family: More than three times a week  . Attends Religious Services: Not on file  . Active Member of Clubs or Organizations: Not on file  . Attends Archivist Meetings: Not on file  . Marital Status: Not on file    Tobacco Counseling Counseling given: Not Answered   Clinical Intake:  Pre-visit preparation completed: Yes        Diabetes: No  How often do you need to have someone help you when you read instructions, pamphlets, or other written materials from your doctor or pharmacy?: 1 - Never    Interpreter Needed?: No      Activities of Daily Living In your present state of health, do you have any difficulty performing the following activities: 10/29/2020  Hearing? N  Vision? N  Difficulty concentrating or making decisions? N  Walking or climbing stairs? N  Dressing or bathing? N  Doing errands, shopping? N  Preparing Food and eating ? N   Using the Toilet? N  In the past six months, have you accidently leaked urine? Y  Comment Managed with daily liner.  Do you have problems with loss of bowel control? N  Managing your Medications? N  Managing your Finances? N  Housekeeping or managing your Housekeeping? N  Some recent data might be hidden    Patient Care Team: Einar Pheasant, MD as PCP - General (Internal Medicine)  Indicate any recent Medical Services you may have received from other than [facility] providers in the past year (date may be approximate).     Assessment:   This is a routine wellness  examination for Semira.  I connected with Chyan today by telephone and verified that I am speaking with the correct person using two identifiers. Location patient: home Location provider: work Persons participating in the virtual visit: patient, Marine scientist.    I discussed the limitations, risks, security and privacy concerns of performing an evaluation and management service by telephone and the availability of in person appointments. The patient expressed understanding and verbally consented to this telephonic visit.    Interactive audio and video telecommunications were attempted between this provider and patient, however failed, due to patient having technical difficulties OR patient did not have access to video capability.  We continued and completed visit with audio only.  Some vital signs may be absent or patient reported.   Hearing/Vision screen  Hearing Screening   125Hz  250Hz  500Hz  1000Hz  2000Hz  3000Hz  4000Hz  6000Hz  8000Hz   Right ear:           Left ear:           Comments: Patient is able to hear conversational tones without difficulty.  No issues reported.  Vision Screening Comments: Followed by [facility]  Wears corrective lenses when reading  Cataract extraction, bilateral  Visual acuity not assessed per patient preference since they have regular follow up with the ophthalmologist  Dietary issues and  exercise activities discussed: Current Exercise Habits: Home exercise routine, Frequency (Times/Week): 1, Intensity: Mild  Healthy diet Good water intake  Goals Addressed            This Visit's Progress   . Healthy Lifestyle       Healthy diet Stay active Stay hydrated Maintain weight      Depression Screen PHQ 2/9 Scores 10/29/2020 07/18/2020 10/27/2019 06/06/2019 03/22/2018 03/19/2017 12/23/2016  PHQ - 2 Score 0 0 0 0 0 0 0  PHQ- 9 Score - - - - - 0 0    Fall Risk Fall Risk  10/29/2020 07/18/2020 10/27/2019 06/06/2019 03/22/2018  Falls in the past year? 0 0 0 0 No  Number falls in past yr: 0 0 - - -  Injury with Fall? 0 - - - -  Follow up Falls evaluation completed Falls evaluation completed Falls evaluation completed Falls evaluation completed -   FALL RISK PREVENTION PERTAINING TO THE HOME: Handrails I use when climbing stairs? Yes Home free of loose throw rugs in walkways, pet beds, electrical cords, etc? Yes  Adequate lighting in your home to reduce risk of falls? Yes   ASSISTIVE DEVICES UTILIZED TO PREVENT FALLS: Life alert? No  Use of a cane, walker or w/c? No   TIMED UP AND GO: Was the test performed? No . Virtual visit.   Cognitive Function: Patient is alert and oriented x3.  Denies difficulty focusing,making decisions, memory loss.  Enjoys stain glass painting and other brain health activities.  MMSE/6CIT deferred. Normal by direct communication/observation.   MMSE - Mini Mental State Exam 10/27/2019 03/22/2018 03/19/2017 03/17/2016  Not completed: Unable to complete - - -  Orientation to time - 5 5 5   Orientation to Place - 5 5 5   Registration - 3 3 3   Attention/ Calculation - 5 5 5   Recall - 3 3 3   Language- name 2 objects - 2 2 2   Language- repeat - 1 1 1   Language- follow 3 step command - 3 3 3   Language- read & follow direction - 1 1 1   Write a sentence - 1 1 1   Copy design - 1 1 1  Total score - 30 30 30         Immunizations Immunization History   Administered Date(s) Administered  . DTaP 12/09/2010  . Influenza Split 04/29/2012  . Influenza, High Dose Seasonal PF 02/19/2016, 03/19/2017, 06/03/2018, 02/28/2020  . Influenza,inj,Quad PF,6+ Mos 02/21/2013, 02/13/2014, 05/14/2015  . Influenza-Unspecified 02/16/2019  . PFIZER(Purple Top)SARS-COV-2 Vaccination 07/31/2019, 08/23/2019, 04/05/2020  . Pneumococcal Conjugate-13 04/28/2013  . Pneumococcal Polysaccharide-23 02/13/2014  . Zoster 10/12/2011  . Zoster Recombinat (Shingrix) 02/01/2019, 05/16/2019    Health Maintenance Health Maintenance  Topic Date Due  . COVID-19 Vaccine (4 - Booster for [place] series) 07/06/2020  . TETANUS/TDAP  12/08/2020  . INFLUENZA VACCINE  01/14/2021  . COLONOSCOPY (Pts 45-49yrs Insurance coverage will need to be confirmed)  01/27/2021  . MAMMOGRAM  08/01/2021  . DEXA SCAN  Completed  . Hepatitis C Screening  Completed  . PNA vac Low Risk Adult  Completed  . HPV VACCINES  Aged Out   Colorectal cancer screening: Type of screening: Colonoscopy. Completed 01/28/16. Repeat every 5 years  Mammogram status: Completed 08/01/20. Repeat every year  Lung Cancer Screening: (Low Dose CT Chest recommended if Age 9-80 years, 30 pack-year currently smoking OR have quit w/in 15years.) does not qualify.   Vision Screening: Recommended annual ophthalmology exams for early detection of glaucoma and other disorders of the eye. Is the patient up to date with their annual eye exam?  Yes  Who is the provider or what is the name of the office in which the patient attends annual eye exams? [facility], Dr. Jeni Salles.   Dental Screening: Recommended annual dental exams for proper oral hygiene. Visits every 6 months.   Community Resource Referral / Chronic Care Management: CRR required this visit?  No   CCM required this visit?  No      Plan:   Keep all routine maintenance appointments.   Next scheduled lab 02/12/21   Follow up 02/14/21  I have  personally reviewed and noted the following in the patient's chart:   . Medical and social history . Use of alcohol, tobacco or illicit drugs  . Current medications and supplements including opioid prescriptions. Patient is not taking any opioids.  . Functional ability and status . Nutritional status . Physical activity . Advanced directives . List of other physicians . Hospitalizations, surgeries, and ER visits in previous 12 months . Vitals . Screenings to include cognitive, depression, and falls . Referrals and appointments  In addition, I have reviewed and discussed with patient certain preventive protocols, quality metrics, and best practice recommendations. A written personalized care plan for preventive services as well as general preventive health recommendations were provided to patient via mychart.     Varney Biles, LPN   12/17/5007

## 2020-10-31 DIAGNOSIS — M62830 Muscle spasm of back: Secondary | ICD-10-CM | POA: Diagnosis not present

## 2020-10-31 DIAGNOSIS — M5136 Other intervertebral disc degeneration, lumbar region: Secondary | ICD-10-CM | POA: Diagnosis not present

## 2020-10-31 DIAGNOSIS — M9905 Segmental and somatic dysfunction of pelvic region: Secondary | ICD-10-CM | POA: Diagnosis not present

## 2020-10-31 DIAGNOSIS — M9903 Segmental and somatic dysfunction of lumbar region: Secondary | ICD-10-CM | POA: Diagnosis not present

## 2020-11-14 DIAGNOSIS — M9905 Segmental and somatic dysfunction of pelvic region: Secondary | ICD-10-CM | POA: Diagnosis not present

## 2020-11-14 DIAGNOSIS — M9903 Segmental and somatic dysfunction of lumbar region: Secondary | ICD-10-CM | POA: Diagnosis not present

## 2020-11-14 DIAGNOSIS — M62830 Muscle spasm of back: Secondary | ICD-10-CM | POA: Diagnosis not present

## 2020-11-14 DIAGNOSIS — M5136 Other intervertebral disc degeneration, lumbar region: Secondary | ICD-10-CM | POA: Diagnosis not present

## 2020-11-24 ENCOUNTER — Other Ambulatory Visit: Payer: Self-pay | Admitting: Internal Medicine

## 2020-12-12 DIAGNOSIS — M9903 Segmental and somatic dysfunction of lumbar region: Secondary | ICD-10-CM | POA: Diagnosis not present

## 2020-12-12 DIAGNOSIS — M5136 Other intervertebral disc degeneration, lumbar region: Secondary | ICD-10-CM | POA: Diagnosis not present

## 2020-12-12 DIAGNOSIS — M9905 Segmental and somatic dysfunction of pelvic region: Secondary | ICD-10-CM | POA: Diagnosis not present

## 2020-12-12 DIAGNOSIS — M62830 Muscle spasm of back: Secondary | ICD-10-CM | POA: Diagnosis not present

## 2021-01-09 DIAGNOSIS — M9905 Segmental and somatic dysfunction of pelvic region: Secondary | ICD-10-CM | POA: Diagnosis not present

## 2021-01-09 DIAGNOSIS — M9903 Segmental and somatic dysfunction of lumbar region: Secondary | ICD-10-CM | POA: Diagnosis not present

## 2021-01-09 DIAGNOSIS — M5136 Other intervertebral disc degeneration, lumbar region: Secondary | ICD-10-CM | POA: Diagnosis not present

## 2021-01-09 DIAGNOSIS — M62830 Muscle spasm of back: Secondary | ICD-10-CM | POA: Diagnosis not present

## 2021-01-17 ENCOUNTER — Ambulatory Visit: Payer: Medicare PPO | Admitting: Pulmonary Disease

## 2021-01-17 ENCOUNTER — Other Ambulatory Visit: Payer: Self-pay

## 2021-01-17 ENCOUNTER — Encounter: Payer: Self-pay | Admitting: Pulmonary Disease

## 2021-01-17 VITALS — BP 112/78 | HR 75 | Temp 98.1°F | Ht 63.0 in | Wt 140.6 lb

## 2021-01-17 DIAGNOSIS — Z87891 Personal history of nicotine dependence: Secondary | ICD-10-CM

## 2021-01-17 DIAGNOSIS — J449 Chronic obstructive pulmonary disease, unspecified: Secondary | ICD-10-CM

## 2021-01-17 MED ORDER — TRELEGY ELLIPTA 100-62.5-25 MCG/INH IN AEPB
100.0000 ug | INHALATION_SPRAY | Freq: Every day | RESPIRATORY_TRACT | 0 refills | Status: DC
Start: 2021-01-17 — End: 2021-03-05

## 2021-01-17 MED ORDER — ALBUTEROL SULFATE HFA 108 (90 BASE) MCG/ACT IN AERS: 2.0000 | INHALATION_SPRAY | Freq: Four times a day (QID) | RESPIRATORY_TRACT | 2 refills | Status: DC | PRN

## 2021-01-17 NOTE — Progress Notes (Signed)
Subjective:    Patient ID: Savannah Cox, female    DOB: [birth year], 75 y.o.   MRN: [id number] Chief Complaint  Patient presents with   Follow-up    Breathing has been okay, has been on Trelegy which seems to be working. Coughing w/ clear phlegm. Hoarse.     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  This is mild.  This is a scheduled visit.  She was initially seen here on 13 December 2019 last seen on 13 February 2020 by Wyn Quaker, NP.  At that time she was switched to [place].  Which marked improvement on the Trelegy.  She has no issues with the chronic congestion she used to have on the Trelegy.  Occasionally she has a dry nonproductive cough.  Occasionally also has some hoarseness which responds to rinsing her mouth well after Trelegy.  She has not had any fevers, chills or sweats.  No hemoptysis.  No orthopnea, paroxysmal nocturnal dyspnea, chest pain, calf pain or tenderness.  Overall she feels well and looks well.  DATA: 01/30/2020 PFTs: FEV1 1.99 L or 94% predicted, FVC 2.40 L or 85% predicted, no bronchodilator response.  FEV1/FVC 83%.  Lung volumes are low end of normal.  Diffusion capacity mildly reduced.   Review of Systems A 10 point review of systems was performed and it is as noted above otherwise negative.  Patient Active Problem List   Diagnosis Date Noted   Back pain 10/11/2020   Centrilobular emphysema ([place]) 02/13/2020   Former smoker 02/13/2020   Sleeping difficulties 01/22/2020   Leukopenia 09/12/2019   SOB (shortness of breath) 06/06/2019   Chest tightness 03/20/2019   Osteoporosis 07/12/2018   Bilateral hip pain 06/12/2018   Aortic atherosclerosis ([place]) 10/10/2017   Finger pain, right 07/05/2017   Acute colitis 04/17/2016   Diarrhea 04/17/2016   Hypokalemia 04/17/2016   Headache 04/17/2016   GIB (gastrointestinal bleeding) 04/14/2016   History of colonic polyps 01/31/2016   Change in bowel  movement 10/21/2015   Pain of both thumbs 01/14/2015   Right hip pain 01/10/2015   Health care maintenance 09/04/2014   Abnormal mammogram 09/25/2013   Abdominal pain 02/22/2013   Anemia 05/01/2012   GERD (gastroesophageal reflux disease) 05/01/2012   Hematuria 05/01/2012   Hypothyroidism 05/01/2012   Hypercholesterolemia 05/01/2012   Hypertension 05/01/2012   Social History   Tobacco Use   Smoking status: Former    Packs/day: 1.50    Years: 30.00    Pack years: 45.00    Types: Cigarettes    Quit date: 1990    Years since quitting: 32.6   Smokeless tobacco: Never  Substance Use Topics   Alcohol use: Yes    Alcohol/week: 0.0 standard drinks    Comment: Rare drink, one drink per month   Allergies  Allergen Reactions   Augmentin [Amoxicillin-Pot Clavulanate] Rash    Has patient had a PCN reaction causing immediate rash, facial/tongue/throat swelling, SOB or lightheadedness with hypotension: No Has patient had a PCN reaction causing severe rash involving mucus membranes or skin necrosis: No Has patient had a PCN reaction that required hospitalization: No Has patient had a PCN reaction occurring within the last 10 years: No If all of the above answers are "NO", then may proceed with Cephalosporin use.    Current Meds  Medication Sig   Fluticasone-Umeclidin-Vilant (TRELEGY ELLIPTA) 100-62.5-25 MCG/INH AEPB Inhale 1 puff into the lungs daily.   levothyroxine (SYNTHROID) 75  MCG tablet TAKE 1 TABLET BY MOUTH EVERY DAY   lisinopril (ZESTRIL) 10 MG tablet TAKE 1 TABLET BY MOUTH EVERY DAY   loratadine (CLARITIN) 10 MG tablet Take 10 mg by mouth daily.    Multiple Vitamins-Minerals (CENTRUM SILVER ULTRA WOMENS PO) Take 1 tablet by mouth daily.    omeprazole (PRILOSEC) 40 MG capsule TAKE 1 CAPSULE (40 MG TOTAL) BY MOUTH ONCE DAILY TAKE 30 MINS BEFORE A MEAL   rosuvastatin (CRESTOR) 5 MG tablet TAKE 2 TABLETS BY MOUTH ON Monday, Wednesday and Friday. TAKE 1 TABLET ON ALL OTHER DAYS    sertraline (ZOLOFT) 50 MG tablet TAKE 1 TABLET BY MOUTH EVERY DAY   Immunization History  Administered Date(s) Administered   DTaP 12/09/2010   Influenza Split 04/29/2012   Influenza, High Dose Seasonal PF 02/19/2016, 03/19/2017, 06/03/2018, 02/28/2020   Influenza,inj,Quad PF,6+ Mos 02/21/2013, 02/13/2014, 05/14/2015   Influenza-Unspecified 02/16/2019   PFIZER(Purple Top)SARS-COV-2 Vaccination 07/31/2019, 08/23/2019, 04/05/2020   Pneumococcal Conjugate-13 04/28/2013   Pneumococcal Polysaccharide-23 02/13/2014   Zoster Recombinat (Shingrix) 02/01/2019, 05/16/2019   Zoster, Live 10/12/2011      Objective:   Physical Exam BP 112/78 (BP Location: Left Arm, Patient Position: Sitting, Cuff Size: Normal)   Pulse 75   Temp 98.1 F (36.7 C) (Oral)   Ht '5\' 3"'$  (1.6 m)   Wt 140 lb 9.6 oz (63.8 kg)   SpO2 99%   BMI 24.91 kg/m  GENERAL: Well-developed, well-nourished, no acute distress. HEAD: Normocephalic, atraumatic.  EYES: Pupils equal, round, reactive to light.  No scleral icterus.  MOUTH: Nose/mouth/throat not examined due to masking requirements for COVID 19. NECK: Supple. No thyromegaly. Trachea midline. No JVD.  No adenopathy. PULMONARY: Good air entry bilaterally.  Coarse, otherwise, no adventitious sounds. CARDIOVASCULAR: S1 and S2. Regular rate and rhythm.  No rubs, murmurs or gallops heard. ABDOMEN: Benign. MUSCULOSKELETAL: No joint deformity, no clubbing, no edema.  NEUROLOGIC: No focal deficit, no gait disturbance, speech is fluent. SKIN: Intact,warm,dry. PSYCH: Mood and behavior normal.     Assessment & Plan:     ICD-10-CM   1. COPD with chronic bronchitis and emphysema ([place])  J44.9 Pulmonary Function Test [facility] Only   Continue Trelegy Ellipta Albuterol for as needed use PFTs prior to next visit Follow-up 6 months    2. Former smoker - remote  [phone]    No evidence of relapse Outside of window for LDCT screening     . Orders Placed This Encounter  Procedures    Pulmonary Function Test [facility] Only    Standing Status:   Future    Standing Expiration Date:   01/17/2022    Scheduling Instructions:     5 months    Order Specific Question:   Full PFT: includes the following: basic spirometry, spirometry pre & post bronchodilator, diffusion capacity (DLCO), lung volumes    Answer:   Full PFT    Order Specific Question:   This test can only be performed at    Answer:   [facility] ordered this encounter  Medications   Fluticasone-Umeclidin-Vilant (TRELEGY ELLIPTA) 100-62.5-25 MCG/INH AEPB    Sig: Inhale 100 mcg into the lungs daily.    Dispense:  28 each    Refill:  0    Order Specific Question:   Lot Number?    Answer:   [id number]    Order Specific Question:   Expiration Date?    Answer:   08/13/2022    Order Specific Question:   Quantity  Answer:   1   albuterol (VENTOLIN HFA) 108 (90 Base) MCG/ACT inhaler    Sig: Inhale 2 puffs into the lungs every 6 (six) hours as needed for wheezing or shortness of breath.    Dispense:  8 g    Refill:  2   C. Derrill Kay, MD Advanced Bronchoscopy PCCM [facility]    *This note was dictated using voice recognition software/Dragon.  Despite best efforts to proofread, errors can occur which can change the meaning.  Any change was purely unintentional.

## 2021-01-17 NOTE — Patient Instructions (Signed)
Continue Trelegy Ellipta 1 inhalation daily.  Make sure you rinse your mouth well after use, you can use a little bit of baking soda in the water.  We will also make sure you have an emergency inhaler a prescription has been sent to your pharmacy.  We will schedule breathing test before your return visit.  See you in follow-up in 6 months time call sooner should any new problems arise.

## 2021-01-20 ENCOUNTER — Other Ambulatory Visit: Payer: Self-pay | Admitting: Internal Medicine

## 2021-02-06 DIAGNOSIS — M62830 Muscle spasm of back: Secondary | ICD-10-CM | POA: Diagnosis not present

## 2021-02-06 DIAGNOSIS — M5136 Other intervertebral disc degeneration, lumbar region: Secondary | ICD-10-CM | POA: Diagnosis not present

## 2021-02-06 DIAGNOSIS — M9903 Segmental and somatic dysfunction of lumbar region: Secondary | ICD-10-CM | POA: Diagnosis not present

## 2021-02-06 DIAGNOSIS — M9905 Segmental and somatic dysfunction of pelvic region: Secondary | ICD-10-CM | POA: Diagnosis not present

## 2021-02-10 IMAGING — CT CT ABD-PEL WO/W CM
3 of 12 series · 11 of 46 positions shown, 17 images · IV contrast (omnipaque)
Comparison: April 19, 2014, also exams from 0630 and 5559.

CLINICAL DATA: Hematuria

EXAM:
CT ABDOMEN AND PELVIS WITHOUT AND WITH CONTRAST
TECHNIQUE: Multidetector CT imaging of the abdomen and pelvis was performed
following the standard protocol before and following the bolus
administration of intravenous contrast.
CONTRAST:  125mL OMNIPAQUE IOHEXOL 300 MG/ML  SOLN

[Series 2: abd without pre 5.00 · axial · non-contrast · 0.71mm/px · z∈[-1438,-1373]mm · 2 of 89 slices shown]
[im 13/89  soft-tissue]
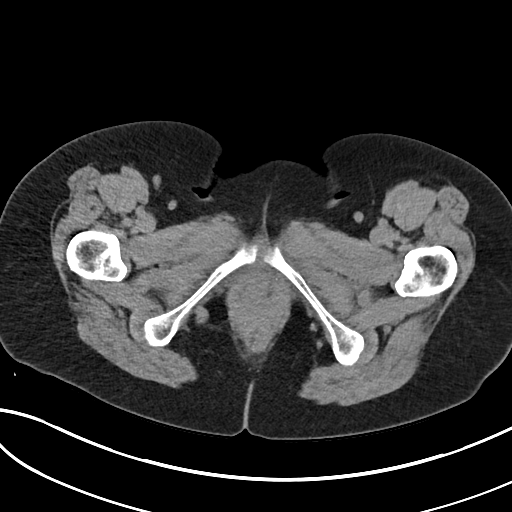
[im 26/89  soft-tissue]
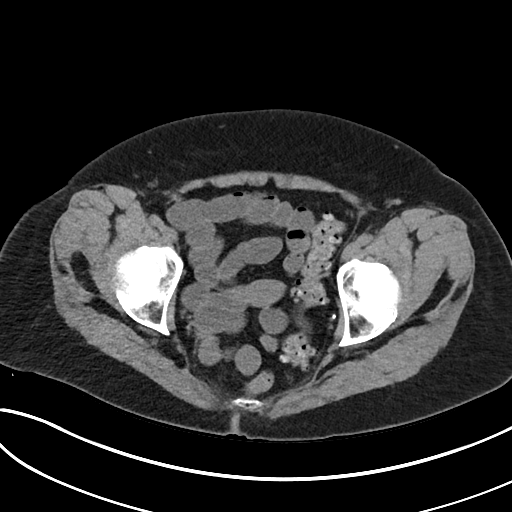

[Series 5: cor without without pre 2.00 cor · coronal · non-contrast · 0.71mm/px · 2 of 133 slices shown, 3 images]
[im 45/133  soft-tissue]
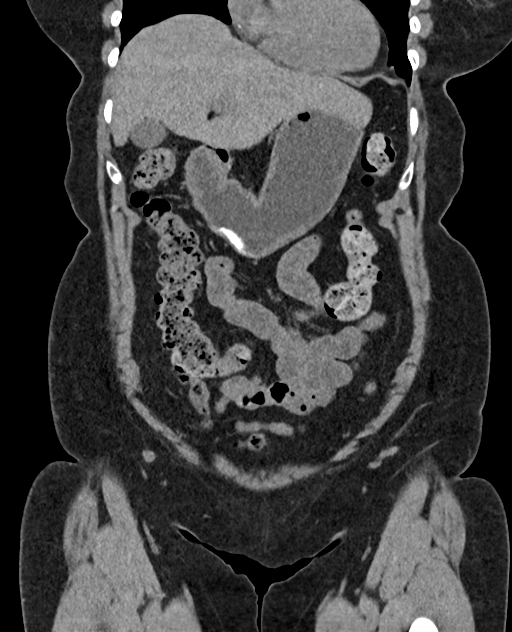
[im 45/133  bone]
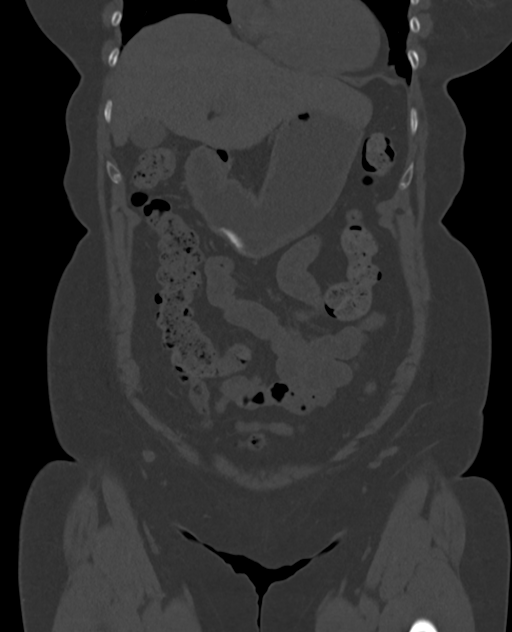
[im 89/133  soft-tissue]
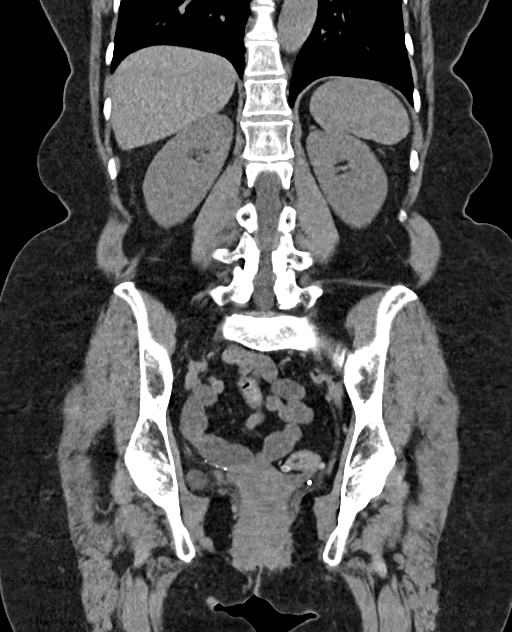

[Series 17: axial delay delay prone 5.00 · axial · delayed · 0.70mm/px · z∈[-1441,-1106]mm · 7 of 91 slices shown, 12 images]
[im 12/91  soft-tissue]
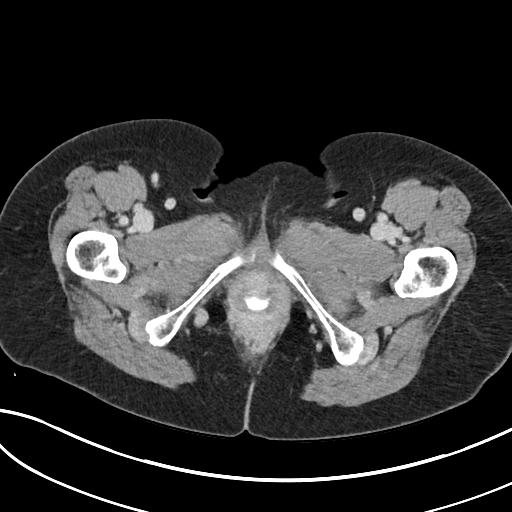
[im 12/91  bone]
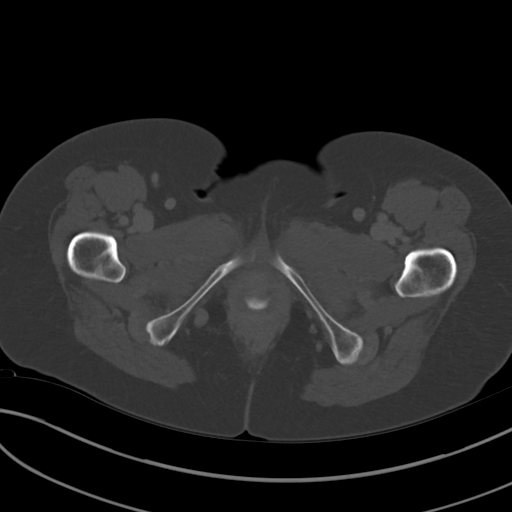
[im 23/91  soft-tissue]
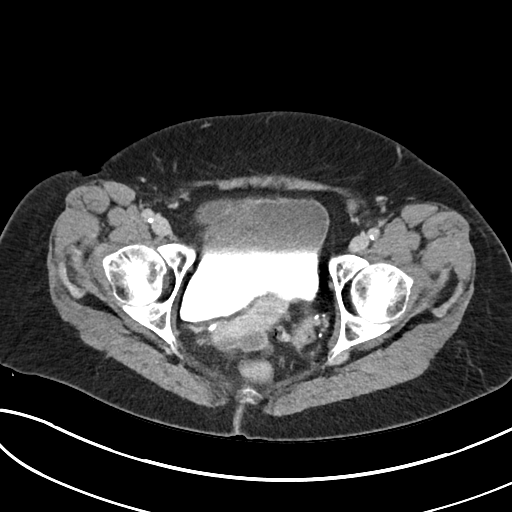
[im 34/91  soft-tissue]
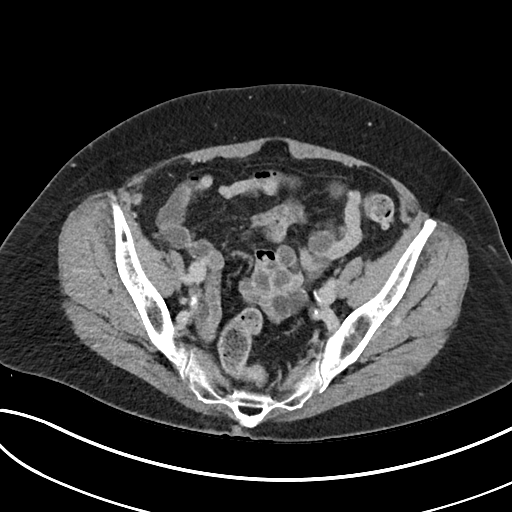
[im 46/91  soft-tissue]
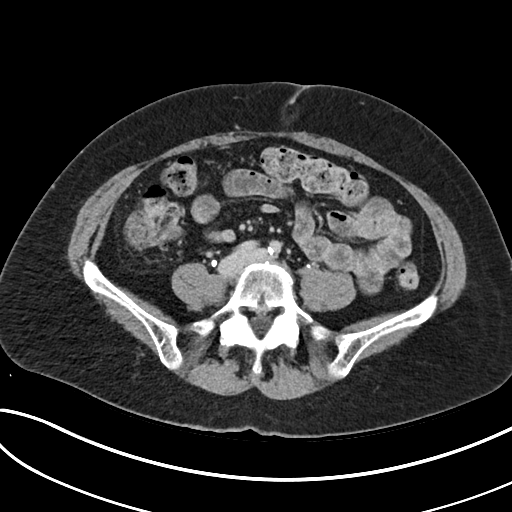
[im 46/91  lung]
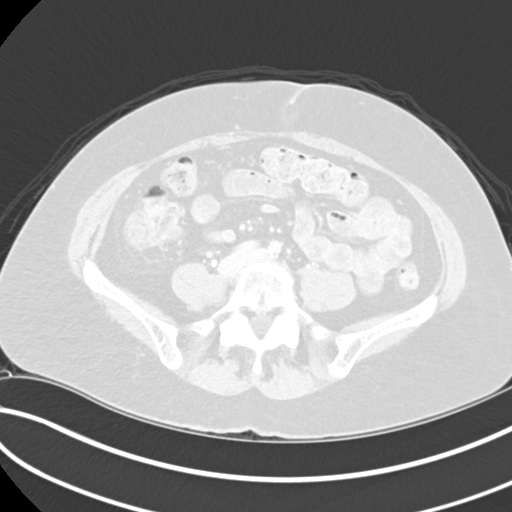
[im 57/91  soft-tissue]
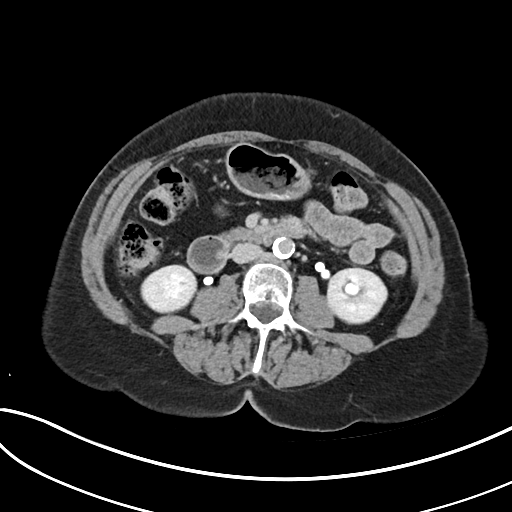
[im 57/91  lung]
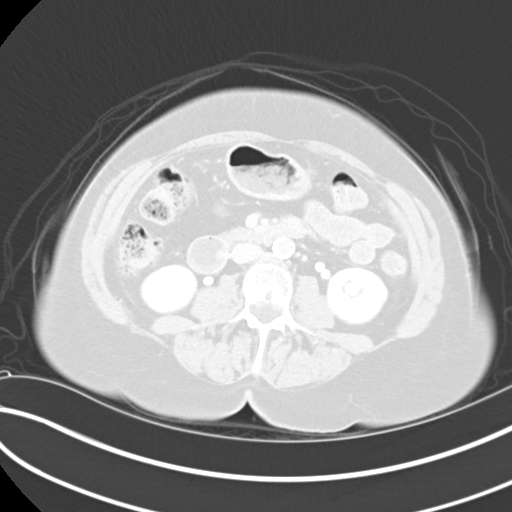
[im 68/91  soft-tissue]
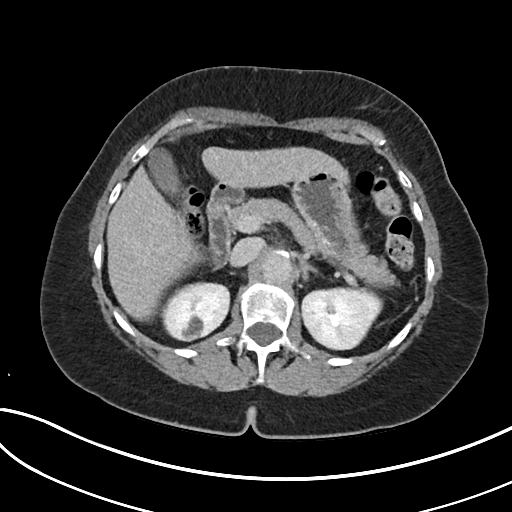
[im 68/91  lung]
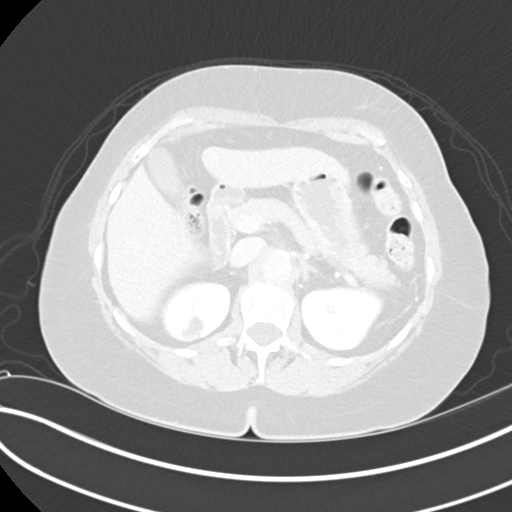
[im 79/91  soft-tissue]
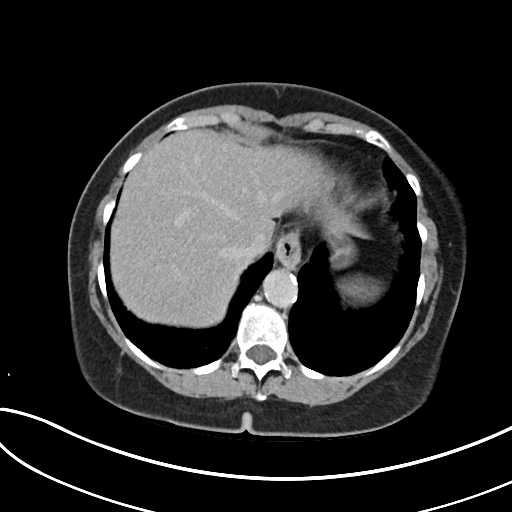
[im 79/91  lung]
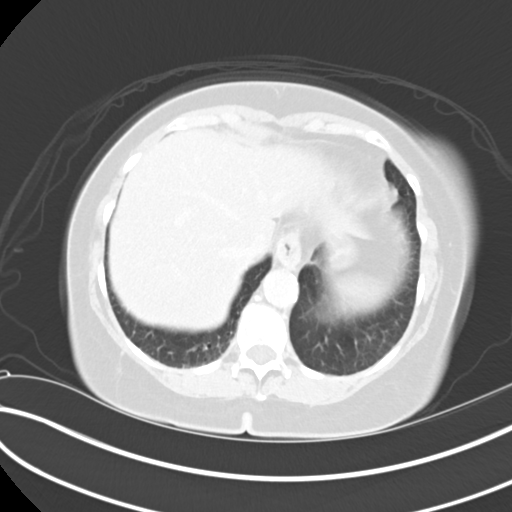

[11 of 46 positions shown; findings below may reference images not displayed]

FINDINGS: Lower chest: No consolidation.  No pleural effusion.

Hepatobiliary: Suspect background hepatic steatosis. No focal,
suspicious hepatic lesion. No pericholecystic stranding. Portal vein
is patent in the liver.

Pancreas: Pancreas without focal lesion, ductal dilation or
inflammation.

Spleen: Spleen normal size and contour.

Adrenals/Urinary Tract: Adrenal glands are normal.

No nephrolithiasis. Symmetric renal enhancement. Small cysts in the
bilateral kidneys largest on the RIGHT in the upper pole 13 mm and
on the LEFT in the interpolar LEFT kidney also approximately 13 mm.
No hydronephrosis.

Bifid renal pelvis on the LEFT with diffuse urothelial enhancement
throughout the LEFT ureter. Funneling of the ureter through the
hiatus at the pelvic floor along with the bladder base.

Descent of the bladder base below the pelvic floor compatible with
cystocele, approximately 2.5 cm below the pubococcygeal line.
Funneling of the ureter into the anterior compartment pelvic floor
descent.

Bifid LEFT renal pelvis may explain some of the distortion of the
renal pelvic anatomy seen on the venous phase assessment. There is
no filling defect in the upper tracts.

Stomach/Bowel: No acute gastrointestinal process. The appendix is
normal. Stool fills much of the colon. Colonic diverticulosis.

Vascular/Lymphatic: Calcified atheromatous plaque in the abdominal
aorta without aneurysm. Moderate calcification. Retroaortic LEFT
renal vein. No adenopathy in the upper abdomen or in the
retroperitoneum.

No pelvic lymphadenopathy.

Reproductive: Atrophic uterus. No pelvic mass. Pelvic floor
dysfunction as described.

Other: Small fat containing umbilical hernia.

Musculoskeletal: No acute musculoskeletal process. No destructive
bone finding.
IMPRESSION: 1. Mild diffuse urothelial enhancement particularly on the LEFT may
be due to urinary tract infection or transient reflux or obstruction
in the setting of marked pelvic floor dysfunction with funneling of
the bladder base through the anterior compartment of the pelvic
floor hiatus. Uro-gynecologic consultation may be helpful. Consider
follow-up urinalysis with further imaging as warranted. No upper
tract lesion seen on excretory phase imaging.
2. Moderately large cystocele presumably without provocation or
straining, may worsen with straining. Correlate with urinary
symptoms.
3. Bilateral renal cysts.
4. Colonic diverticulosis without evidence of acute diverticulitis.
5. Aortic atherosclerosis.
6. Small fat containing umbilical hernia.

Aortic Atherosclerosis ([id number]).

## 2021-02-12 ENCOUNTER — Other Ambulatory Visit (INDEPENDENT_AMBULATORY_CARE_PROVIDER_SITE_OTHER): Payer: Medicare PPO

## 2021-02-12 ENCOUNTER — Other Ambulatory Visit: Payer: Self-pay

## 2021-02-12 DIAGNOSIS — E7800 Pure hypercholesterolemia, unspecified: Secondary | ICD-10-CM

## 2021-02-12 DIAGNOSIS — I10 Essential (primary) hypertension: Secondary | ICD-10-CM | POA: Diagnosis not present

## 2021-02-12 LAB — CBC WITH DIFFERENTIAL/PLATELET
Basophils Absolute: 0.1 10*3/uL (ref 0.0–0.1)
Basophils Relative: 1.4 % (ref 0.0–3.0)
Eosinophils Absolute: 0.1 10*3/uL (ref 0.0–0.7)
Eosinophils Relative: 2.9 % (ref 0.0–5.0)
HCT: 40.4 % (ref 36.0–46.0)
Hemoglobin: 13.2 g/dL (ref 12.0–15.0)
Lymphocytes Relative: 22.3 % (ref 12.0–46.0)
Lymphs Abs: 1.1 10*3/uL (ref 0.7–4.0)
MCHC: 32.7 g/dL (ref 30.0–36.0)
MCV: 98.1 fl (ref 78.0–100.0)
Monocytes Absolute: 0.9 10*3/uL (ref 0.1–1.0)
Monocytes Relative: 18.3 % — ABNORMAL HIGH (ref 3.0–12.0)
Neutro Abs: 2.6 10*3/uL (ref 1.4–7.7)
Neutrophils Relative %: 55.1 % (ref 43.0–77.0)
Platelets: 219 10*3/uL (ref 150.0–400.0)
RBC: 4.12 Mil/uL (ref 3.87–5.11)
RDW: 14.2 % (ref 11.5–15.5)
WBC: 4.8 10*3/uL (ref 4.0–10.5)

## 2021-02-12 LAB — LIPID PANEL
Cholesterol: 198 mg/dL (ref 0–200)
HDL: 77.3 mg/dL (ref >39.00)
LDL Cholesterol: 101 mg/dL — ABNORMAL HIGH (ref 0–99)
NonHDL: 120.66
Total CHOL/HDL Ratio: 3
Triglycerides: 96 mg/dL (ref 0.0–149.0)
VLDL: 19.2 mg/dL (ref 0.0–40.0)

## 2021-02-12 LAB — HEPATIC FUNCTION PANEL
ALT: 28 U/L (ref 0–35)
AST: 38 U/L — ABNORMAL HIGH (ref 0–37)
Albumin: 4 g/dL (ref 3.5–5.2)
Alkaline Phosphatase: 50 U/L (ref 39–117)
Bilirubin, Direct: 0.1 mg/dL (ref 0.0–0.3)
Total Bilirubin: 0.4 mg/dL (ref 0.2–1.2)
Total Protein: 6.7 g/dL (ref 6.0–8.3)

## 2021-02-12 LAB — BASIC METABOLIC PANEL
BUN: 16 mg/dL (ref 6–23)
CO2: 25 mEq/L (ref 19–32)
Calcium: 9.2 mg/dL (ref 8.4–10.5)
Chloride: 108 mEq/L (ref 96–112)
Creatinine, Ser: 0.69 mg/dL (ref 0.40–1.20)
GFR: 86.23 mL/min (ref >60.00)
Glucose, Bld: 92 mg/dL (ref 70–99)
Potassium: 3.8 mEq/L (ref 3.5–5.1)
Sodium: 141 mEq/L (ref 135–145)

## 2021-02-14 ENCOUNTER — Other Ambulatory Visit: Payer: Self-pay

## 2021-02-14 ENCOUNTER — Ambulatory Visit: Payer: Medicare PPO | Admitting: Internal Medicine

## 2021-02-14 ENCOUNTER — Encounter: Payer: Self-pay | Admitting: Internal Medicine

## 2021-02-14 VITALS — BP 118/70 | HR 100 | Temp 97.9°F | Ht 62.99 in | Wt 142.4 lb

## 2021-02-14 DIAGNOSIS — I10 Essential (primary) hypertension: Secondary | ICD-10-CM | POA: Diagnosis not present

## 2021-02-14 DIAGNOSIS — I700 Atherosclerosis of aorta: Secondary | ICD-10-CM

## 2021-02-14 DIAGNOSIS — D72821 Monocytosis (symptomatic): Secondary | ICD-10-CM

## 2021-02-14 DIAGNOSIS — J432 Centrilobular emphysema: Secondary | ICD-10-CM

## 2021-02-14 DIAGNOSIS — E039 Hypothyroidism, unspecified: Secondary | ICD-10-CM

## 2021-02-14 DIAGNOSIS — Z23 Encounter for immunization: Secondary | ICD-10-CM

## 2021-02-14 DIAGNOSIS — K219 Gastro-esophageal reflux disease without esophagitis: Secondary | ICD-10-CM | POA: Diagnosis not present

## 2021-02-14 DIAGNOSIS — R945 Abnormal results of liver function studies: Secondary | ICD-10-CM | POA: Diagnosis not present

## 2021-02-14 DIAGNOSIS — R079 Chest pain, unspecified: Secondary | ICD-10-CM | POA: Diagnosis not present

## 2021-02-14 DIAGNOSIS — E7800 Pure hypercholesterolemia, unspecified: Secondary | ICD-10-CM

## 2021-02-14 DIAGNOSIS — R7989 Other specified abnormal findings of blood chemistry: Secondary | ICD-10-CM | POA: Insufficient documentation

## 2021-02-14 NOTE — Progress Notes (Signed)
Patient ID: Savannah Cox, female   DOB: [birth year], 75 y.o.   MRN: [id number]   Subjective:    Patient ID: Savannah Cox, female    DOB: 02/22/1946, 75 y.o.   MRN: [id number]  This visit occurred during the SARS-CoV-2 public health emergency.  Safety protocols were in place, including screening questions prior to the visit, additional usage of staff PPE, and extensive cleaning of exam room while observing appropriate contact time as indicated for disinfecting solutions.   Patient here for a scheduled follow up.    Chief Complaint  Patient presents with   Follow-up   Gastroesophageal Reflux   Hyperlipidemia   Hypertension   Hypothyroidism   Anemia   .   HPI Reports she is doing relatively well.  Trying to stay active.  Saw Dr Savannah Cox 01/17/21 - f/u mild COPD with chronic bronchitis.  Note reviewed.  Recommended continuing trelegy.  She has seen Dr Savannah Cox previously - last visit 05/14/20.  Negative ETT/echo previously.  EF normal.  No changes made.  Recommended continuing to follow.  She reports that two weeks ago, noticed chest pain.  She had just come home from shopping.  Was unloading groceries.  Noticed chest pain and pressure into her neck.  Took TUMS.  Went to lie down.  No nausea or vomiting.  Breathing overall stable.  Has noticed some sob with exertion.  No significant change.  Omeprazole controls acid reflux.  Bowels moving.  H   Past Medical History:  Diagnosis Date   Allergic state    Anemia    Complication of anesthesia    Diverticulosis    GERD (gastroesophageal reflux disease)    History of hiatal hernia    History of kidney stones    Hypercholesterolemia    Hypertension    Hypothyroidism    Osteoporosis, postmenopausal    PONV (postoperative nausea and vomiting)    nauseated with colonoscopy   Shingles    Past Surgical History:  Procedure Laterality Date   BREAST BIOPSY Left 90s   benign   COLONOSCOPY WITH PROPOFOL N/A 01/28/2016   Procedure: COLONOSCOPY  WITH PROPOFOL;  Surgeon: Savannah Sails, MD;  Location: [facility] ENDOSCOPY;  Service: Endoscopy;  Laterality: N/A;   EXCISION METACARPAL MASS Right 04/30/2017   Procedure: EXCISION MUCOID CYST FIFTH FINGER RIGHT HAND;  Surgeon: Savannah Knows, MD;  Location: [facility] ORS;  Service: Orthopedics;  Laterality: Right;   EYE SURGERY Bilateral 2010   cataracts   Family History  Problem Relation Age of Onset   Heart disease Father        died age 44 - myocardial infarction   Hypercholesterolemia Mother    Arthritis Mother    Heart attack Mother    Kidney cancer Other        aunt   Breast cancer Maternal Aunt    Colon cancer Neg Hx    Social History   Socioeconomic History   Marital status: Married    Spouse name: Not on file   Number of children: 3   Years of education: 16   Highest education level: Not on file  Occupational History   Occupation: retired Product manager: RETIRED  Tobacco Use   Smoking status: Former    Packs/day: 1.50    Years: 30.00    Pack years: 45.00    Types: Cigarettes    Quit date: 1990    Years since quitting: 32.6   Smokeless tobacco: Never  Vaping Use  Vaping Use: Never used  Substance and Sexual Activity   Alcohol use: Yes    Alcohol/week: 0.0 standard drinks    Comment: Rare drink, one drink per month   Drug use: No   Sexual activity: Yes  Other Topics Concern   Not on file  Social History Narrative   Not on file   Social Determinants of Health   Financial Resource Strain: Low Risk    Difficulty of Paying Living Expenses: Not hard at all  Food Insecurity: No Food Insecurity   Worried About Charity fundraiser in the Last Year: Never true   [place] in the Last Year: Never true  Transportation Needs: No Transportation Needs   Lack of Transportation (Medical): No   Lack of Transportation (Non-Medical): No  Physical Activity: Sufficiently Active   Days of Exercise per Week: 3 days   Minutes of Exercise per Session: 60 min   Stress: No Stress Concern Present   Feeling of Stress : Not at all  Social Connections: Unknown   Frequency of Communication with Friends and Family: More than three times a week   Frequency of Social Gatherings with Friends and Family: More than three times a week   Attends Religious Services: Not on Electrical engineer or Organizations: Not on file   Attends Archivist Meetings: Not on file   Marital Status: Not on file    Review of Systems  Constitutional:  Negative for appetite change and unexpected weight change.  HENT:  Negative for congestion and sinus pressure.   Respiratory:  Negative for cough.        SOB with exertion.  Breathing overall stable. No increased cough.   Cardiovascular:  Negative for chest pain, palpitations and leg swelling.  Gastrointestinal:  Negative for abdominal pain, diarrhea, nausea and vomiting.  Genitourinary:  Negative for difficulty urinating and dysuria.  Musculoskeletal:  Negative for joint swelling and myalgias.  Skin:  Negative for color change and rash.  Neurological:  Negative for dizziness, light-headedness and headaches.  Psychiatric/Behavioral:  Negative for agitation and dysphoric mood.       Objective:     BP 118/70   Pulse 100   Temp 97.9 F (36.6 C)   Ht 5' 2.99" (1.6 m)   Wt 142 lb 6.4 oz (64.6 kg)   SpO2 97%   BMI 25.23 kg/m  Wt Readings from Last 3 Encounters:  02/14/21 142 lb 6.4 oz (64.6 kg)  01/17/21 140 lb 9.6 oz (63.8 kg)  10/29/20 139 lb (63 kg)    Physical Exam Vitals reviewed.  Constitutional:      General: She is not in acute distress.    Appearance: Normal appearance.  HENT:     Head: Normocephalic and atraumatic.     Right Ear: External ear normal.     Left Ear: External ear normal.  Eyes:     General: No scleral icterus.       Right eye: No discharge.        Left eye: No discharge.     Conjunctiva/sclera: Conjunctivae normal.  Neck:     Thyroid: No thyromegaly.   Cardiovascular:     Rate and Rhythm: Normal rate and regular rhythm.  Pulmonary:     Effort: No respiratory distress.     Breath sounds: Normal breath sounds. No wheezing.  Abdominal:     General: Bowel sounds are normal.     Palpations: Abdomen is soft.  Tenderness: There is no abdominal tenderness.  Musculoskeletal:        General: No swelling or tenderness.     Cervical back: Neck supple. No tenderness.  Lymphadenopathy:     Cervical: No cervical adenopathy.  Skin:    Findings: No erythema or rash.  Neurological:     Mental Status: She is alert.  Psychiatric:        Mood and Affect: Mood normal.        Behavior: Behavior normal.    Outpatient Encounter Medications as of 02/14/2021  Medication Sig   albuterol (VENTOLIN HFA) 108 (90 Base) MCG/ACT inhaler Inhale 2 puffs into the lungs every 6 (six) hours as needed for wheezing or shortness of breath.   Fluticasone-Umeclidin-Vilant (TRELEGY ELLIPTA) 100-62.5-25 MCG/INH AEPB Inhale 100 mcg into the lungs daily.   levothyroxine (SYNTHROID) 75 MCG tablet TAKE 1 TABLET BY MOUTH EVERY DAY   lisinopril (ZESTRIL) 10 MG tablet TAKE 1 TABLET BY MOUTH EVERY DAY   loratadine (CLARITIN) 10 MG tablet Take 10 mg by mouth daily.    Multiple Vitamins-Minerals (CENTRUM SILVER ULTRA WOMENS PO) Take 1 tablet by mouth daily.    omeprazole (PRILOSEC) 40 MG capsule TAKE 1 CAPSULE (40 MG TOTAL) BY MOUTH ONCE DAILY TAKE 30 MINS BEFORE A MEAL   rosuvastatin (CRESTOR) 5 MG tablet TAKE 2 TABLETS BY MOUTH ON Monday, Wednesday and Friday. TAKE 1 TABLET ON ALL OTHER DAYS (Patient taking differently: Take 1 TABLET by mouth everyday)   sertraline (ZOLOFT) 50 MG tablet TAKE 1 TABLET BY MOUTH EVERY DAY   [DISCONTINUED] betamethasone dipropionate 0.05 % cream Apply topically 2 (two) times daily as needed. (Patient not taking: Reported on 02/14/2021)   [DISCONTINUED] Fluticasone-Umeclidin-Vilant (TRELEGY ELLIPTA) 100-62.5-25 MCG/INH AEPB Inhale 1 puff into the lungs  daily. (Patient not taking: Reported on 02/14/2021)   No facility-administered encounter medications on file as of 02/14/2021.     Lab Results  Component Value Date   WBC 4.8 02/12/2021   HGB 13.2 02/12/2021   HCT 40.4 02/12/2021   PLT 219.0 02/12/2021   GLUCOSE 92 02/12/2021   CHOL 198 02/12/2021   TRIG 96.0 02/12/2021   HDL 77.30 02/12/2021   LDLDIRECT 165.9 05/11/2013   LDLCALC 101 (H) 02/12/2021   ALT 28 02/12/2021   AST 38 (H) 02/12/2021   NA 141 02/12/2021   K 3.8 02/12/2021   CL 108 02/12/2021   CREATININE 0.69 02/12/2021   BUN 16 02/12/2021   CO2 25 02/12/2021   TSH 0.65 10/09/2020   INR 0.96 04/14/2016    MM 3D SCREEN BREAST BILATERAL  Result Date: 08/06/2020 CLINICAL DATA:  Screening. EXAM: DIGITAL SCREENING BILATERAL MAMMOGRAM WITH TOMOSYNTHESIS AND CAD TECHNIQUE: Bilateral screening digital craniocaudal and mediolateral oblique mammograms were obtained. Bilateral screening digital breast tomosynthesis was performed. The images were evaluated with computer-aided detection. COMPARISON:  Previous exam(s). ACR Breast Density Category b: There are scattered areas of fibroglandular density. FINDINGS: There are no findings suspicious for malignancy. IMPRESSION: No mammographic evidence of malignancy. A result letter of this screening mammogram will be mailed directly to the patient. RECOMMENDATION: Screening mammogram in one year. (Code:[id number]-B-01Y) BI-RADS CATEGORY  1: Negative. Electronically Signed   By: Franki Cabot M.D.   On: 08/06/2020 11:59       Assessment & Plan:   Problem List Items Addressed This Visit     Abnormal liver function test    Recheck liver panel to confirm wnl.       Relevant Orders  Hepatic function panel   Aortic atherosclerosis (HCC)    Continue crestor.       Centrilobular emphysema ([place])    Evaluated by pulmonary as outlined.   Continue trelegy.  Breathing overall appears to be stable.       Chest pain    Chest pain episode as  outlined.  Has seen Dr Savannah Cox previously.  Note reviewed.  This episode occurred while unloading car and carrying in groceries.  Took TUMS.  Pain in chest and radiated up into her jaw.   EKG - SR with no acute ischemic changes. Given symptoms, risk factors and family history - discussed reevaluation - cardiology. Pt in agreement.  If any return of pain or symptoms, she is to be evaluated.        Relevant Orders   EKG 12-Lead (Completed)   GERD (gastroesophageal reflux disease)    prilosec controls acid reflux symptoms.  Had the chest pressure as outlined.  Took TUMS.  No actual acid reflux.  Continue prilosec.  Cardiac w/up as outlined.  If negative, can f/u with GI evaluation.  prilosec daily.       Hypercholesterolemia    Continue crestor.  Low cholesterol diet and exercise.  Follow lipid panel and liver function tests.   Lab Results  Component Value Date   CHOL 198 02/12/2021   HDL 77.30 02/12/2021   LDLCALC 101 (H) 02/12/2021   LDLDIRECT 165.9 05/11/2013   TRIG 96.0 02/12/2021   CHOLHDL 3 02/12/2021       Hypertension    Continue lisinopril.  Blood pressure doing well.  Follow pressure.  Follow metabolic panel.       Hypothyroidism    On thyroid replacement.  Follow tsh.       Monocytosis    Recheck cbc.        Relevant Orders   CBC with Differential/Platelet   Other Visit Diagnoses     Need for influenza vaccination    -  Primary   Relevant Orders   Flu Vaccine QUAD High Dose(Fluad) (Completed)        Einar Pheasant, MD

## 2021-02-18 ENCOUNTER — Encounter: Payer: Self-pay | Admitting: Internal Medicine

## 2021-02-18 NOTE — Assessment & Plan Note (Signed)
Chest pain episode as outlined.  Has seen Dr Ubaldo Glassing previously.  Note reviewed.  This episode occurred while unloading car and carrying in groceries.  Took TUMS.  Pain in chest and radiated up into her jaw.   EKG - SR with no acute ischemic changes. Given symptoms, risk factors and family history - discussed reevaluation - cardiology. Pt in agreement.  If any return of pain or symptoms, she is to be evaluated.

## 2021-02-18 NOTE — Assessment & Plan Note (Signed)
prilosec controls acid reflux symptoms.  Had the chest pressure as outlined.  Took TUMS.  No actual acid reflux.  Continue prilosec.  Cardiac w/up as outlined.  If negative, can f/u with GI evaluation.  prilosec daily.

## 2021-02-18 NOTE — Assessment & Plan Note (Signed)
Continue crestor 

## 2021-02-18 NOTE — Assessment & Plan Note (Signed)
On thyroid replacement.  Follow tsh.  

## 2021-02-18 NOTE — Assessment & Plan Note (Signed)
Recheck cbc.  

## 2021-02-18 NOTE — Assessment & Plan Note (Signed)
Recheck liver panel to confirm wnl.  

## 2021-02-18 NOTE — Assessment & Plan Note (Signed)
Continue crestor.  Low cholesterol diet and exercise.  Follow lipid panel and liver function tests.   Lab Results  Component Value Date   CHOL 198 02/12/2021   HDL 77.30 02/12/2021   LDLCALC 101 (H) 02/12/2021   LDLDIRECT 165.9 05/11/2013   TRIG 96.0 02/12/2021   CHOLHDL 3 02/12/2021

## 2021-02-18 NOTE — Assessment & Plan Note (Signed)
Evaluated by pulmonary as outlined.   Continue trelegy.  Breathing overall appears to be stable.

## 2021-02-18 NOTE — Assessment & Plan Note (Signed)
F/u endocrinology.  Reclast.

## 2021-02-18 NOTE — Assessment & Plan Note (Signed)
Continue lisinopril.  Blood pressure doing well.  Follow pressure.  Follow metabolic panel.  

## 2021-02-28 ENCOUNTER — Other Ambulatory Visit: Payer: Self-pay | Admitting: Pulmonary Disease

## 2021-03-06 ENCOUNTER — Other Ambulatory Visit (INDEPENDENT_AMBULATORY_CARE_PROVIDER_SITE_OTHER): Payer: Medicare PPO

## 2021-03-06 ENCOUNTER — Other Ambulatory Visit: Payer: Self-pay

## 2021-03-06 DIAGNOSIS — M62830 Muscle spasm of back: Secondary | ICD-10-CM | POA: Diagnosis not present

## 2021-03-06 DIAGNOSIS — R7989 Other specified abnormal findings of blood chemistry: Secondary | ICD-10-CM

## 2021-03-06 DIAGNOSIS — M5136 Other intervertebral disc degeneration, lumbar region: Secondary | ICD-10-CM | POA: Diagnosis not present

## 2021-03-06 DIAGNOSIS — R945 Abnormal results of liver function studies: Secondary | ICD-10-CM

## 2021-03-06 DIAGNOSIS — M9905 Segmental and somatic dysfunction of pelvic region: Secondary | ICD-10-CM | POA: Diagnosis not present

## 2021-03-06 DIAGNOSIS — D72821 Monocytosis (symptomatic): Secondary | ICD-10-CM

## 2021-03-06 DIAGNOSIS — M9903 Segmental and somatic dysfunction of lumbar region: Secondary | ICD-10-CM | POA: Diagnosis not present

## 2021-03-06 LAB — CBC WITH DIFFERENTIAL/PLATELET
Basophils Absolute: 0.1 10*3/uL (ref 0.0–0.1)
Basophils Relative: 1.3 % (ref 0.0–3.0)
Eosinophils Absolute: 0.1 10*3/uL (ref 0.0–0.7)
Eosinophils Relative: 2.3 % (ref 0.0–5.0)
HCT: 40 % (ref 36.0–46.0)
Hemoglobin: 13.4 g/dL (ref 12.0–15.0)
Lymphocytes Relative: 29.1 % (ref 12.0–46.0)
Lymphs Abs: 1.3 10*3/uL (ref 0.7–4.0)
MCHC: 33.4 g/dL (ref 30.0–36.0)
MCV: 97.3 fl (ref 78.0–100.0)
Monocytes Absolute: 0.6 10*3/uL (ref 0.1–1.0)
Monocytes Relative: 13.8 % — ABNORMAL HIGH (ref 3.0–12.0)
Neutro Abs: 2.4 10*3/uL (ref 1.4–7.7)
Neutrophils Relative %: 53.5 % (ref 43.0–77.0)
Platelets: 189 10*3/uL (ref 150.0–400.0)
RBC: 4.12 Mil/uL (ref 3.87–5.11)
RDW: 14.3 % (ref 11.5–15.5)
WBC: 4.5 10*3/uL (ref 4.0–10.5)

## 2021-03-06 LAB — HEPATIC FUNCTION PANEL
ALT: 28 U/L (ref 0–35)
AST: 39 U/L — ABNORMAL HIGH (ref 0–37)
Albumin: 4.1 g/dL (ref 3.5–5.2)
Alkaline Phosphatase: 50 U/L (ref 39–117)
Bilirubin, Direct: 0.1 mg/dL (ref 0.0–0.3)
Total Bilirubin: 0.5 mg/dL (ref 0.2–1.2)
Total Protein: 7 g/dL (ref 6.0–8.3)

## 2021-03-07 ENCOUNTER — Other Ambulatory Visit: Payer: Medicare PPO

## 2021-03-07 ENCOUNTER — Other Ambulatory Visit: Payer: Self-pay | Admitting: Internal Medicine

## 2021-03-07 DIAGNOSIS — R945 Abnormal results of liver function studies: Secondary | ICD-10-CM

## 2021-03-07 DIAGNOSIS — R7989 Other specified abnormal findings of blood chemistry: Secondary | ICD-10-CM

## 2021-03-07 NOTE — Progress Notes (Signed)
Order placed for abdominal ultrasound.   

## 2021-03-19 ENCOUNTER — Other Ambulatory Visit: Payer: Self-pay

## 2021-03-19 ENCOUNTER — Ambulatory Visit
Admission: RE | Admit: 2021-03-19 | Discharge: 2021-03-19 | Disposition: A | Discharge status: Home / Self Care | Payer: Medicare PPO | Source: Ambulatory Visit | Attending: Internal Medicine | Admitting: Internal Medicine

## 2021-03-19 DIAGNOSIS — I700 Atherosclerosis of aorta: Secondary | ICD-10-CM | POA: Diagnosis not present

## 2021-03-19 DIAGNOSIS — R7989 Other specified abnormal findings of blood chemistry: Secondary | ICD-10-CM | POA: Insufficient documentation

## 2021-03-19 DIAGNOSIS — N281 Cyst of kidney, acquired: Secondary | ICD-10-CM | POA: Diagnosis not present

## 2021-03-20 ENCOUNTER — Encounter: Payer: Self-pay | Admitting: Internal Medicine

## 2021-03-20 DIAGNOSIS — N281 Cyst of kidney, acquired: Secondary | ICD-10-CM | POA: Insufficient documentation

## 2021-04-02 ENCOUNTER — Ambulatory Visit: Payer: Medicare PPO | Admitting: Internal Medicine

## 2021-04-02 ENCOUNTER — Other Ambulatory Visit: Payer: Self-pay

## 2021-04-02 ENCOUNTER — Encounter: Payer: Self-pay | Admitting: Internal Medicine

## 2021-04-02 DIAGNOSIS — I10 Essential (primary) hypertension: Secondary | ICD-10-CM | POA: Diagnosis not present

## 2021-04-02 DIAGNOSIS — K219 Gastro-esophageal reflux disease without esophagitis: Secondary | ICD-10-CM | POA: Diagnosis not present

## 2021-04-02 DIAGNOSIS — J432 Centrilobular emphysema: Secondary | ICD-10-CM

## 2021-04-02 DIAGNOSIS — E7800 Pure hypercholesterolemia, unspecified: Secondary | ICD-10-CM

## 2021-04-02 DIAGNOSIS — M9905 Segmental and somatic dysfunction of pelvic region: Secondary | ICD-10-CM | POA: Diagnosis not present

## 2021-04-02 DIAGNOSIS — E039 Hypothyroidism, unspecified: Secondary | ICD-10-CM

## 2021-04-02 DIAGNOSIS — M62830 Muscle spasm of back: Secondary | ICD-10-CM | POA: Diagnosis not present

## 2021-04-02 DIAGNOSIS — D649 Anemia, unspecified: Secondary | ICD-10-CM

## 2021-04-02 DIAGNOSIS — I700 Atherosclerosis of aorta: Secondary | ICD-10-CM | POA: Diagnosis not present

## 2021-04-02 DIAGNOSIS — R0602 Shortness of breath: Secondary | ICD-10-CM

## 2021-04-02 DIAGNOSIS — R079 Chest pain, unspecified: Secondary | ICD-10-CM | POA: Diagnosis not present

## 2021-04-02 DIAGNOSIS — M9903 Segmental and somatic dysfunction of lumbar region: Secondary | ICD-10-CM | POA: Diagnosis not present

## 2021-04-02 DIAGNOSIS — R7989 Other specified abnormal findings of blood chemistry: Secondary | ICD-10-CM

## 2021-04-02 DIAGNOSIS — M5136 Other intervertebral disc degeneration, lumbar region: Secondary | ICD-10-CM | POA: Diagnosis not present

## 2021-04-02 NOTE — Patient Instructions (Signed)
Appointment with [facility] - Dr Corky Sox - tomorrow (Wednesday 04/03/21) at 9:45.

## 2021-04-02 NOTE — Progress Notes (Signed)
Patient ID: Savannah Cox, female   DOB: 1945-07-04, 75 y.o.   MRN: [id number]   Subjective:    Patient ID: Savannah Cox, female    DOB: 20-Aug-1945, 75 y.o.   MRN: [id number]  This visit occurred during the SARS-CoV-2 public health emergency.  Safety protocols were in place, including screening questions prior to the visit, additional usage of staff PPE, and extensive cleaning of exam room while observing appropriate contact time as indicated for disinfecting solutions.   Patient here for a scheduled follow up.  Marland Kitchen   HPI Here to follow up regarding her blood pressure, cholesterol and liver function tests.  Recent abdominal ultrasound - no liver abnormality.  Last visit, she reported an episode of chest pain and pressure in her neck.  She had just come home from shopping and was unloading groceries.  EKG unrevealing.  Discussed f/u with cardiology.  Since her last visit, she has not experienced another episode of chest pain.  Does report sob with exertion.  Has mild copd.  Using trelegy. Due f/u with pulmonary (per pt).  Still occasional cough - some phlegm.  No acid reflux.  No abdominal pain or bowel change reported.  Does report noticing intermittent episodes - great toe - color change.  States one side of one toe will turn blue.  Intermittent episodes over the last several months.  No pain.  No injury.  No known triggers.     Past Medical History:  Diagnosis Date   Allergic state    Anemia    Complication of anesthesia    Diverticulosis    GERD (gastroesophageal reflux disease)    History of hiatal hernia    History of kidney stones    Hypercholesterolemia    Hypertension    Hypothyroidism    Osteoporosis, postmenopausal    PONV (postoperative nausea and vomiting)    nauseated with colonoscopy   Shingles    Past Surgical History:  Procedure Laterality Date   BREAST BIOPSY Left 90s   benign   COLONOSCOPY WITH PROPOFOL N/A 01/28/2016   Procedure: COLONOSCOPY WITH PROPOFOL;   Surgeon: Lollie Sails, MD;  Location: [facility] ENDOSCOPY;  Service: Endoscopy;  Laterality: N/A;   EXCISION METACARPAL MASS Right 04/30/2017   Procedure: EXCISION MUCOID CYST FIFTH FINGER RIGHT HAND;  Surgeon: Hessie Knows, MD;  Location: [facility] ORS;  Service: Orthopedics;  Laterality: Right;   EYE SURGERY Bilateral 2010   cataracts   Family History  Problem Relation Age of Onset   Heart disease Father        died age 26 - myocardial infarction   Hypercholesterolemia Mother    Arthritis Mother    Heart attack Mother    Kidney cancer Other        aunt   Breast cancer Maternal Aunt    Colon cancer Neg Hx    Social History   Socioeconomic History   Marital status: Married    Spouse name: Not on file   Number of children: 3   Years of education: 16   Highest education level: Not on file  Occupational History   Occupation: retired Product manager: RETIRED  Tobacco Use   Smoking status: Former    Packs/day: 1.50    Years: 30.00    Pack years: 45.00    Types: Cigarettes    Quit date: 1990    Years since quitting: 32.8   Smokeless tobacco: Never  Vaping Use   Vaping Use: Never used  Substance and Sexual Activity   Alcohol use: Yes    Alcohol/week: 0.0 standard drinks    Comment: Rare drink, one drink per month   Drug use: No   Sexual activity: Yes  Other Topics Concern   Not on file  Social History Narrative   Not on file   Social Determinants of Health   Financial Resource Strain: Low Risk    Difficulty of Paying Living Expenses: Not hard at all  Food Insecurity: No Food Insecurity   Worried About Charity fundraiser in the Last Year: Never true   [place] in the Last Year: Never true  Transportation Needs: No Transportation Needs   Lack of Transportation (Medical): No   Lack of Transportation (Non-Medical): No  Physical Activity: Sufficiently Active   Days of Exercise per Week: 3 days   Minutes of Exercise per Session: 60 min  Stress: No Stress  Concern Present   Feeling of Stress : Not at all  Social Connections: Unknown   Frequency of Communication with Friends and Family: More than three times a week   Frequency of Social Gatherings with Friends and Family: More than three times a week   Attends Religious Services: Not on Electrical engineer or Organizations: Not on file   Attends Archivist Meetings: Not on file   Marital Status: Not on file     Review of Systems  Constitutional:  Negative for appetite change and unexpected weight change.  HENT:  Negative for congestion and sinus pressure.   Respiratory:  Positive for shortness of breath. Negative for chest tightness.        Occasional cough as outlined.    Cardiovascular:  Negative for chest pain, palpitations and leg swelling.  Gastrointestinal:  Negative for abdominal pain, diarrhea, nausea and vomiting.  Genitourinary:  Negative for difficulty urinating and dysuria.  Musculoskeletal:  Negative for joint swelling and myalgias.  Skin:  Negative for rash.       Reports one side of great toe - blue (intermittent episodes).    Neurological:  Negative for dizziness, light-headedness and headaches.  Psychiatric/Behavioral:  Negative for agitation and dysphoric mood.       Objective:     BP 110/60   Pulse 64   Temp 97.6 F (36.4 C) (Oral)   Resp 16   Ht 5\' 2"  (1.575 m)   Wt 141 lb 6 oz (64.1 kg)   SpO2 96%   BMI 25.86 kg/m  Wt Readings from Last 3 Encounters:  04/02/21 141 lb 6 oz (64.1 kg)  02/14/21 142 lb 6.4 oz (64.6 kg)  01/17/21 140 lb 9.6 oz (63.8 kg)    Physical Exam Vitals reviewed.  Constitutional:      General: She is not in acute distress.    Appearance: Normal appearance.  HENT:     Head: Normocephalic and atraumatic.     Right Ear: External ear normal.     Left Ear: External ear normal.  Eyes:     General: No scleral icterus.       Right eye: No discharge.        Left eye: No discharge.     Conjunctiva/sclera:  Conjunctivae normal.  Neck:     Thyroid: No thyromegaly.  Cardiovascular:     Rate and Rhythm: Normal rate and regular rhythm.  Pulmonary:     Effort: No respiratory distress.     Breath sounds: Normal breath sounds. No wheezing.  Abdominal:     General: Bowel sounds are normal.     Palpations: Abdomen is soft.     Tenderness: There is no abdominal tenderness.  Musculoskeletal:        General: No swelling or tenderness.     Cervical back: Neck supple. No tenderness.     Comments: Pulses - DP pulses palpable and equal bilateral.   Lymphadenopathy:     Cervical: No cervical adenopathy.  Skin:    Findings: No erythema or rash.  Neurological:     Mental Status: She is alert.  Psychiatric:        Mood and Affect: Mood normal.        Behavior: Behavior normal.     Outpatient Encounter Medications as of 04/02/2021  Medication Sig   albuterol (VENTOLIN HFA) 108 (90 Base) MCG/ACT inhaler Inhale 2 puffs into the lungs every 6 (six) hours as needed for wheezing or shortness of breath.   levothyroxine (SYNTHROID) 75 MCG tablet TAKE 1 TABLET BY MOUTH EVERY DAY   lisinopril (ZESTRIL) 10 MG tablet TAKE 1 TABLET BY MOUTH EVERY DAY   loratadine (CLARITIN) 10 MG tablet Take 10 mg by mouth daily.    Multiple Vitamins-Minerals (CENTRUM SILVER ULTRA WOMENS PO) Take 1 tablet by mouth daily.    omeprazole (PRILOSEC) 40 MG capsule TAKE 1 CAPSULE (40 MG TOTAL) BY MOUTH ONCE DAILY TAKE 30 MINS BEFORE A MEAL   rosuvastatin (CRESTOR) 5 MG tablet TAKE 2 TABLETS BY MOUTH ON Monday, Wednesday and Friday. TAKE 1 TABLET ON ALL OTHER DAYS (Patient taking differently: Take 1 TABLET by mouth everyday)   sertraline (ZOLOFT) 50 MG tablet TAKE 1 TABLET BY MOUTH EVERY DAY   TRELEGY ELLIPTA 100-62.5-25 MCG/INH AEPB TAKE 1 PUFF BY MOUTH EVERY DAY   No facility-administered encounter medications on file as of 04/02/2021.     Lab Results  Component Value Date   WBC 4.5 03/06/2021   HGB 13.4 03/06/2021   HCT 40.0  03/06/2021   PLT 189.0 03/06/2021   GLUCOSE 92 02/12/2021   CHOL 198 02/12/2021   TRIG 96.0 02/12/2021   HDL 77.30 02/12/2021   LDLDIRECT 165.9 05/11/2013   LDLCALC 101 (H) 02/12/2021   ALT 28 03/06/2021   AST 39 (H) 03/06/2021   NA 141 02/12/2021   K 3.8 02/12/2021   CL 108 02/12/2021   CREATININE 0.69 02/12/2021   BUN 16 02/12/2021   CO2 25 02/12/2021   TSH 0.65 10/09/2020   INR 0.96 04/14/2016    US Abdomen Complete  Result Date: 03/19/2021 CLINICAL DATA:  Abnormal LFTs. EXAM: ABDOMEN ULTRASOUND COMPLETE COMPARISON:  Abdominal ultrasound dated 03/14/2014 and CT dated 02/10/2020. FINDINGS: Gallbladder: No gallstones or wall thickening visualized. No sonographic Murphy sign noted by sonographer. Common bile duct: Diameter: 3 mm Liver: The liver is unremarkable. Portal vein is patent on color Doppler imaging with normal direction of blood flow towards the liver. IVC: No abnormality visualized. Pancreas: Visualized portion unremarkable. Spleen: Size and appearance within normal limits. Right Kidney: Length: 9.8 cm. Normal echogenicity. No hydronephrosis or shadowing stone. There is is a 1.4 cm upper pole cyst. Left Kidney: Length: 10.6 cm. Normal echogenicity. No hydronephrosis or shadowing stone. There is a 1.8 cm interpolar cyst. Abdominal aorta: No aneurysm visualized. There is atherosclerotic calcification of the abdominal aorta. Other findings: None. IMPRESSION: 1. No acute findings. 2. Small bilateral renal cysts. 3.  Aortic Atherosclerosis (ICD10-I70.0). Electronically Signed   By: Anner Crete M.D.   On: 03/19/2021 16:42  Assessment & Plan:   Problem List Items Addressed This Visit     Abnormal liver function test    Recent abdominal ultrasound - no liver abnormality.  Recheck liver panel with next labs.       Anemia    Follow cbc.       Aortic atherosclerosis (HCC)    Continue crestor.       Centrilobular emphysema ([place])    Evaluated by pulmonary as  outlined.   Continue trelegy.  SOB with exertion as outlined.  Pursue cardiac w/up.  States due f/u with pulmonary - due f/u PFTs.  Contact pulmonary.        Chest pain    Had the episode of chest pain episode as outlined in last note.  This episode occurred while unloading car and carrying in groceries.  Took TUMS.  Pain in chest and radiated up into her jaw.   EKG - SR with no acute ischemic changes. Given symptoms, risk factors and family history - discussed reevaluation - cardiology. Pt in agreement.  If any return of pain or symptoms, she is to be evaluated.        GERD (gastroesophageal reflux disease)    No acid reflux reported.  Continues on prilosec.       Hypercholesterolemia    Continue crestor.  Low cholesterol diet and exercise.  Follow lipid panel and liver function tests.   Lab Results  Component Value Date   CHOL 198 02/12/2021   HDL 77.30 02/12/2021   LDLCALC 101 (H) 02/12/2021   LDLDIRECT 165.9 05/11/2013   TRIG 96.0 02/12/2021   CHOLHDL 3 02/12/2021       Relevant Orders   Hepatic function panel   Lipid panel   Hypertension    Continue lisinopril.  Blood pressure doing well.  Follow pressure.  Follow metabolic panel.       Relevant Orders   Basic metabolic panel   Hypothyroidism    On thyroid replacement.  Follow tsh.        SOB (shortness of breath)    SOB with exertion as outlined.  Had the episode of chest discomfort as outlined.  Discussed f/u with cardiology - question of need for further cardiac w/up.  See last note.  appt made with cardiology for tomorrow.  F/u with pulmonary as outlined (for f/u PFTs).          Einar Pheasant, MD

## 2021-04-03 ENCOUNTER — Ambulatory Visit: Payer: Medicare PPO | Admitting: Dermatology

## 2021-04-03 ENCOUNTER — Encounter: Payer: Self-pay | Admitting: Dermatology

## 2021-04-03 DIAGNOSIS — L821 Other seborrheic keratosis: Secondary | ICD-10-CM

## 2021-04-03 DIAGNOSIS — L578 Other skin changes due to chronic exposure to nonionizing radiation: Secondary | ICD-10-CM | POA: Diagnosis not present

## 2021-04-03 DIAGNOSIS — D1800 Hemangioma unspecified site: Secondary | ICD-10-CM

## 2021-04-03 DIAGNOSIS — D229 Melanocytic nevi, unspecified: Secondary | ICD-10-CM

## 2021-04-03 DIAGNOSIS — Z1283 Encounter for screening for malignant neoplasm of skin: Secondary | ICD-10-CM | POA: Diagnosis not present

## 2021-04-03 DIAGNOSIS — R0789 Other chest pain: Secondary | ICD-10-CM | POA: Diagnosis not present

## 2021-04-03 DIAGNOSIS — B079 Viral wart, unspecified: Secondary | ICD-10-CM | POA: Diagnosis not present

## 2021-04-03 DIAGNOSIS — I700 Atherosclerosis of aorta: Secondary | ICD-10-CM | POA: Diagnosis not present

## 2021-04-03 DIAGNOSIS — L219 Seborrheic dermatitis, unspecified: Secondary | ICD-10-CM

## 2021-04-03 DIAGNOSIS — L814 Other melanin hyperpigmentation: Secondary | ICD-10-CM | POA: Diagnosis not present

## 2021-04-03 MED ORDER — TRIAMCINOLONE ACETONIDE 0.1 % EX LOTN: 1.0000 "application " | TOPICAL_LOTION | Freq: Every day | CUTANEOUS | 5 refills | Status: DC | PRN

## 2021-04-03 NOTE — Patient Instructions (Addendum)
If you get an itchy rash where we applied the squaric acid on your arm, you can use the triamcinolone lotion (or clobetasol ointment if you still have some at home) twice a day for up to 2 weeks until the rash is gone.   Cantharidin Plus is a blistering agent that comes from a beetle.  It needs to be washed off in about 4 hours after application.  Although it is painless when applied in office, it may cause symptoms of mild pain and burning several hours later.  Treated areas will swell and turn red, and blisters may form.  Vaseline and a bandaid may be applied until wound has healed.     Cryotherapy Aftercare  Wash gently with soap and water everyday.   Apply Vaseline and Band-Aid daily until healed.   Recommend vitamin d supplement 600 - 800 units daily   Melanoma ABCDEs  Melanoma is the most dangerous type of skin cancer, and is the leading cause of death from skin disease.  You are more likely to develop melanoma if you: Have light-colored skin, light-colored eyes, or red or blond hair Spend a lot of time in the sun Tan regularly, either outdoors or in a tanning bed Have had blistering sunburns, especially during childhood Have a close family member who has had a melanoma Have atypical moles or large birthmarks  Early detection of melanoma is key since treatment is typically straightforward and cure rates are extremely high if we catch it early.   The first sign of melanoma is often a change in a mole or a new dark spot.  The ABCDE system is a way of remembering the signs of melanoma.  A for asymmetry:  The two halves do not match. B for border:  The edges of the growth are irregular. C for color:  A mixture of colors are present instead of an even brown color. D for diameter:  Melanomas are usually (but not always) greater than 65mm - the size of a pencil eraser. E for evolution:  The spot keeps changing in size, shape, and color.  Please check your skin once per month between  visits. You can use a small mirror in front and a large mirror behind you to keep an eye on the back side or your body.   If you see any new or changing lesions before your next follow-up, please call to schedule a visit.  Please continue daily skin protection including broad spectrum sunscreen SPF 30+ to sun-exposed areas, reapplying every 2 hours as needed when you're outdoors.   Staying in the shade or wearing long sleeves, sun glasses (UVA+UVB protection) and wide brim hats (4-inch brim around the entire circumference of the hat) are also recommended for sun protection.    Recommend taking Heliocare sun protection supplement daily in sunny weather for additional sun protection. For maximum protection on the sunniest days, you can take up to 2 capsules of regular Heliocare OR take 1 capsule of Heliocare Ultra. For prolonged exposure (such as a full day in the sun), you can repeat your dose of the supplement 4 hours after your first dose. Heliocare can be purchased at [facility] or at [email or web address].    If you have any questions or concerns for your doctor, please call our main line at [phone] and press option 4 to reach your doctor's medical assistant. If no one answers, please leave a voicemail as directed and we will return your call as soon as possible. Messages  left after 4 pm will be answered the following business day.   You may also send [place] a message via [place] [facility]. We typically respond to MyChart messages within 1-2 business days.  For prescription refills, please ask your pharmacy to contact our office. Our fax number is [phone].  If you have an urgent issue when the clinic is closed that cannot wait until the next business day, you can page your doctor at the number below.    Please note that while we do our best to be available for urgent issues outside of office hours, we are not available 24/7.   If you have an urgent issue and are unable to reach [place], you may  choose to seek medical care at your doctor's office, retail clinic, urgent care center, or emergency room.  If you have a medical emergency, please immediately call 911 or go to the emergency department.  Pager Numbers  - Dr. Nehemiah Massed: [phone]  - Dr. Laurence Ferrari: [phone]  - Dr. Nicole Kindred: [phone]  In the event of inclement weather, please call our main line at [phone] for an update on the status of any delays or closures.  Dermatology Medication Tips: Please keep the boxes that topical medications come in in order to help keep track of the instructions about where and how to use these. Pharmacies typically print the medication instructions only on the boxes and not directly on the medication tubes.   If your medication is too expensive, please contact our office at [phone] option 4 or send [place] a message through [place].   We are unable to tell what your co-pay for medications will be in advance as this is different depending on your insurance coverage. However, we may be able to find a substitute medication at lower cost or fill out paperwork to get insurance to cover a needed medication.   If a prior authorization is required to get your medication covered by your insurance company, please allow [place] 1-2 business days to complete this process.  Drug prices often vary depending on where the prescription is filled and some pharmacies may offer cheaper prices.  The website www.goodrx.com contains coupons for medications through different pharmacies. The prices here do not account for what the cost may be with help from insurance (it may be cheaper with your insurance), but the website can give you the price if you did not use any insurance.  - You can print the associated coupon and take it with your prescription to the pharmacy.  - You may also stop by our office during regular business hours and pick up a GoodRx coupon card.  - If you need your prescription sent  electronically to a different pharmacy, notify our office through [facility] or by phone at [phone] option 4.

## 2021-04-03 NOTE — Assessment & Plan Note (Signed)
Recent abdominal ultrasound - no liver abnormality.  Recheck liver panel with next labs.

## 2021-04-03 NOTE — Progress Notes (Signed)
Follow-Up Visit   Subjective  Savannah Cox is a 75 y.o. female who presents for the following: Follow-up (Patient here today for 1 year tbse. She reports a spot at left elbow area that is bothersome. She noticed about 2 months ago. ).  Scalp is doing well with triamcinolone occasionally as needed for itch  Patient here for full body skin exam and skin cancer screening.   The following portions of the chart were reviewed this encounter and updated as appropriate:  Tobacco  Allergies  Meds  Problems  Med Hx  Surg Hx  Fam Hx      Review of Systems: No other skin or systemic complaints except as noted in HPI or Assessment and Plan.   Objective  Well appearing patient in no apparent distress; mood and affect are within normal limits.  A full examination was performed including scalp, head, eyes, ears, nose, lips, neck, chest, axillae, abdomen, back, buttocks, bilateral upper extremities, bilateral lower extremities, hands, feet, fingers, toes, fingernails, and toenails. All findings within normal limits unless otherwise noted below.  right upper arm x 1, right calf x 1 (2) Verrucous papules -- Discussed viral etiology and contagion.   Scalp Faint scale at scalp  Assessment & Plan  Verruca (2) right upper arm x 1, right calf x 1  Squaric Acid 3% applied to warts today. Prior to application reviewed risk of inflammation and irritation.  Cantharidin Plus is a blistering agent that comes from a beetle.  It needs to be washed off in about 4 hours after application.  Although it is painless when applied in office, it may cause symptoms of mild pain and burning several hours later.  Treated areas will swell and turn red, and blisters may form.  Vaseline and a bandaid may be applied until wound has healed.  Once healed, the skin may remain temporarily discolored.  It can take weeks to months for pigmentation to return to normal.  Advised to wash off with soap and water in 4 hours or  sooner if it becomes tender before then.  Prior to procedure, discussed risks of blister formation, small wound, skin dyspigmentation, or rare scar following cryotherapy. Recommend Vaseline ointment to treated areas while healing.   Destruction of lesion - right upper arm x 1, right calf x 1  Destruction method: cryotherapy   Informed consent: discussed and consent obtained   Lesion destroyed using liquid nitrogen: Yes   Cryotherapy cycles:  2 Outcome: patient tolerated procedure well with no complications   Post-procedure details: wound care instructions given    Destruction of lesion - right upper arm x 1, right calf x 1  Destruction method: chemical removal   Informed consent: discussed and consent obtained   Timeout:  patient name, date of birth, surgical site, and procedure verified Chemical destruction method: cantharidin   Chemical destruction method comment:  Plus Application time:  4 hours Procedure instructions: patient instructed to wash and dry area   Outcome: patient tolerated procedure well with no complications   Post-procedure details: wound care instructions given    Seborrheic dermatitis Scalp  Seborrheic Dermatitis  -  is a chronic persistent rash characterized by pinkness and scaling most commonly of the mid face but also can occur on the scalp (dandruff), ears; mid chest, mid back and groin.  It tends to be exacerbated by stress and cooler weather.  People who have neurologic disease may experience new onset or exacerbation of existing seborrheic dermatitis.  The condition is  not curable but treatable and can be controlled.  Chronic condition with duration or expected duration over one year. Currently well-controlled.  Continue triamcinolone lotion daily as needed (infrequently) to affected areas of scalp for itch    triamcinolone lotion (KENALOG) 0.1 % - Scalp Apply 1 application topically daily as needed. Apply to affected areas of scalp for  itch  Lentigines - Scattered tan macules - Due to sun exposure - Benign-appearing, observe - Recommend daily broad spectrum sunscreen SPF 30+ to sun-exposed areas, reapply every 2 hours as needed. - Call for any changes  Seborrheic Keratoses - Stuck-on, waxy, tan-brown papules and/or plaques  - Benign-appearing - Discussed benign etiology and prognosis. - Observe - Call for any changes  Melanocytic Nevi - Tan-brown and/or pink-flesh-colored symmetric macules and papules - Benign appearing on exam today - Observation - Call clinic for new or changing moles - Recommend daily use of broad spectrum spf 30+ sunscreen to sun-exposed areas.   Hemangiomas - Red papules - Discussed benign nature - Observe - Call for any changes  Actinic Damage - Chronic condition, secondary to cumulative UV/sun exposure - diffuse scaly erythematous macules with underlying dyspigmentation - Recommend daily broad spectrum sunscreen SPF 30+ to sun-exposed areas, reapply every 2 hours as needed.  - Staying in the shade or wearing long sleeves, sun glasses (UVA+UVB protection) and wide brim hats (4-inch brim around the entire circumference of the hat) are also recommended for sun protection.  - Call for new or changing lesions.  Skin cancer screening performed today.  Return in about 1 year (around 04/03/2022) for tbse. I, Ruthell Rummage, CMA, am acting as scribe for Forest Gleason, MD.   Documentation: I have reviewed the above documentation for accuracy and completeness, and I agree with the above.  Forest Gleason, MD

## 2021-04-03 NOTE — Assessment & Plan Note (Signed)
Follow cbc.  

## 2021-04-03 NOTE — Assessment & Plan Note (Signed)
Continue lisinopril.  Blood pressure doing well.  Follow pressure.  Follow metabolic panel.  

## 2021-04-03 NOTE — Assessment & Plan Note (Signed)
On thyroid replacement.  Follow tsh.  

## 2021-04-03 NOTE — Assessment & Plan Note (Signed)
No acid reflux reported.  Continues on prilosec.  

## 2021-04-03 NOTE — Assessment & Plan Note (Signed)
Continue crestor 

## 2021-04-03 NOTE — Assessment & Plan Note (Signed)
SOB with exertion as outlined.  Had the episode of chest discomfort as outlined.  Discussed f/u with cardiology - question of need for further cardiac w/up.  See last note.  appt made with cardiology for tomorrow.  F/u with pulmonary as outlined (for f/u PFTs).

## 2021-04-03 NOTE — Assessment & Plan Note (Signed)
Evaluated by pulmonary as outlined.   Continue trelegy.  SOB with exertion as outlined.  Pursue cardiac w/up.  States due f/u with pulmonary - due f/u PFTs.  Contact pulmonary.

## 2021-04-03 NOTE — Assessment & Plan Note (Signed)
Continue crestor.  Low cholesterol diet and exercise.  Follow lipid panel and liver function tests.   Lab Results  Component Value Date   CHOL 198 02/12/2021   HDL 77.30 02/12/2021   LDLCALC 101 (H) 02/12/2021   LDLDIRECT 165.9 05/11/2013   TRIG 96.0 02/12/2021   CHOLHDL 3 02/12/2021

## 2021-04-03 NOTE — Assessment & Plan Note (Signed)
Had the episode of chest pain episode as outlined in last note.  This episode occurred while unloading car and carrying in groceries.  Took TUMS.  Pain in chest and radiated up into her jaw.   EKG - SR with no acute ischemic changes. Given symptoms, risk factors and family history - discussed reevaluation - cardiology. Pt in agreement.  If any return of pain or symptoms, she is to be evaluated.

## 2021-04-09 ENCOUNTER — Other Ambulatory Visit: Payer: Self-pay | Admitting: Internal Medicine

## 2021-04-18 ENCOUNTER — Other Ambulatory Visit: Payer: Self-pay | Admitting: Internal Medicine

## 2021-04-30 DIAGNOSIS — M5136 Other intervertebral disc degeneration, lumbar region: Secondary | ICD-10-CM | POA: Diagnosis not present

## 2021-04-30 DIAGNOSIS — M9905 Segmental and somatic dysfunction of pelvic region: Secondary | ICD-10-CM | POA: Diagnosis not present

## 2021-04-30 DIAGNOSIS — M62830 Muscle spasm of back: Secondary | ICD-10-CM | POA: Diagnosis not present

## 2021-04-30 DIAGNOSIS — M9903 Segmental and somatic dysfunction of lumbar region: Secondary | ICD-10-CM | POA: Diagnosis not present

## 2021-05-12 ENCOUNTER — Other Ambulatory Visit: Payer: Self-pay | Admitting: Internal Medicine

## 2021-05-28 DIAGNOSIS — M62830 Muscle spasm of back: Secondary | ICD-10-CM | POA: Diagnosis not present

## 2021-05-28 DIAGNOSIS — M5136 Other intervertebral disc degeneration, lumbar region: Secondary | ICD-10-CM | POA: Diagnosis not present

## 2021-05-28 DIAGNOSIS — Z961 Presence of intraocular lens: Secondary | ICD-10-CM | POA: Diagnosis not present

## 2021-05-28 DIAGNOSIS — M9903 Segmental and somatic dysfunction of lumbar region: Secondary | ICD-10-CM | POA: Diagnosis not present

## 2021-05-28 DIAGNOSIS — Z0100 Encounter for examination of eyes and vision without abnormal findings: Secondary | ICD-10-CM | POA: Diagnosis not present

## 2021-05-28 DIAGNOSIS — M9905 Segmental and somatic dysfunction of pelvic region: Secondary | ICD-10-CM | POA: Diagnosis not present

## 2021-06-03 ENCOUNTER — Ambulatory Visit: Payer: Medicare PPO | Admitting: Dermatology

## 2021-06-05 ENCOUNTER — Other Ambulatory Visit (INDEPENDENT_AMBULATORY_CARE_PROVIDER_SITE_OTHER): Payer: Medicare PPO

## 2021-06-05 ENCOUNTER — Other Ambulatory Visit: Payer: Self-pay

## 2021-06-05 DIAGNOSIS — E7800 Pure hypercholesterolemia, unspecified: Secondary | ICD-10-CM | POA: Diagnosis not present

## 2021-06-05 DIAGNOSIS — I10 Essential (primary) hypertension: Secondary | ICD-10-CM | POA: Diagnosis not present

## 2021-06-05 LAB — HEPATIC FUNCTION PANEL
ALT: 25 U/L (ref 0–35)
AST: 38 U/L — ABNORMAL HIGH (ref 0–37)
Albumin: 4 g/dL (ref 3.5–5.2)
Alkaline Phosphatase: 58 U/L (ref 39–117)
Bilirubin, Direct: 0.1 mg/dL (ref 0.0–0.3)
Total Bilirubin: 0.5 mg/dL (ref 0.2–1.2)
Total Protein: 6.4 g/dL (ref 6.0–8.3)

## 2021-06-05 LAB — BASIC METABOLIC PANEL
BUN: 9 mg/dL (ref 6–23)
CO2: 29 mEq/L (ref 19–32)
Calcium: 9.3 mg/dL (ref 8.4–10.5)
Chloride: 106 mEq/L (ref 96–112)
Creatinine, Ser: 0.8 mg/dL (ref 0.40–1.20)
GFR: 73.05 mL/min (ref >60.00)
Glucose, Bld: 94 mg/dL (ref 70–99)
Potassium: 3.9 mEq/L (ref 3.5–5.1)
Sodium: 142 mEq/L (ref 135–145)

## 2021-06-05 LAB — LIPID PANEL
Cholesterol: 204 mg/dL — ABNORMAL HIGH (ref 0–200)
HDL: 76.6 mg/dL (ref >39.00)
LDL Cholesterol: 111 mg/dL — ABNORMAL HIGH (ref 0–99)
NonHDL: 126.98
Total CHOL/HDL Ratio: 3
Triglycerides: 80 mg/dL (ref 0.0–149.0)
VLDL: 16 mg/dL (ref 0.0–40.0)

## 2021-06-12 ENCOUNTER — Ambulatory Visit: Payer: Medicare PPO | Admitting: Dermatology

## 2021-06-20 ENCOUNTER — Telehealth: Payer: Self-pay

## 2021-06-20 NOTE — Telephone Encounter (Signed)
Patient is aware of date/time of covid test prior to PFT.  

## 2021-06-24 DIAGNOSIS — H43811 Vitreous degeneration, right eye: Secondary | ICD-10-CM | POA: Diagnosis not present

## 2021-06-25 ENCOUNTER — Other Ambulatory Visit: Payer: Self-pay

## 2021-06-25 ENCOUNTER — Other Ambulatory Visit
Admission: RE | Admit: 2021-06-25 | Discharge: 2021-06-25 | Disposition: A | Discharge status: Home / Self Care | Payer: Medicare PPO | Source: Ambulatory Visit | Attending: Pulmonary Disease | Admitting: Pulmonary Disease

## 2021-06-25 DIAGNOSIS — Z20822 Contact with and (suspected) exposure to covid-19: Secondary | ICD-10-CM | POA: Insufficient documentation

## 2021-06-25 DIAGNOSIS — M9905 Segmental and somatic dysfunction of pelvic region: Secondary | ICD-10-CM | POA: Diagnosis not present

## 2021-06-25 DIAGNOSIS — M5136 Other intervertebral disc degeneration, lumbar region: Secondary | ICD-10-CM | POA: Diagnosis not present

## 2021-06-25 DIAGNOSIS — M62830 Muscle spasm of back: Secondary | ICD-10-CM | POA: Diagnosis not present

## 2021-06-25 DIAGNOSIS — Z01812 Encounter for preprocedural laboratory examination: Secondary | ICD-10-CM | POA: Insufficient documentation

## 2021-06-25 DIAGNOSIS — M9903 Segmental and somatic dysfunction of lumbar region: Secondary | ICD-10-CM | POA: Diagnosis not present

## 2021-06-25 LAB — SARS CORONAVIRUS 2 (TAT 6-24 HRS): SARS Coronavirus 2: NEGATIVE

## 2021-06-26 ENCOUNTER — Ambulatory Visit: Payer: Medicare PPO | Attending: Pulmonary Disease

## 2021-06-26 DIAGNOSIS — J42 Unspecified chronic bronchitis: Secondary | ICD-10-CM | POA: Insufficient documentation

## 2021-06-26 DIAGNOSIS — R0600 Dyspnea, unspecified: Secondary | ICD-10-CM | POA: Diagnosis not present

## 2021-06-26 DIAGNOSIS — Z87891 Personal history of nicotine dependence: Secondary | ICD-10-CM | POA: Diagnosis not present

## 2021-06-26 DIAGNOSIS — J449 Chronic obstructive pulmonary disease, unspecified: Secondary | ICD-10-CM | POA: Diagnosis not present

## 2021-06-26 DIAGNOSIS — R059 Cough, unspecified: Secondary | ICD-10-CM | POA: Insufficient documentation

## 2021-06-26 DIAGNOSIS — J439 Emphysema, unspecified: Secondary | ICD-10-CM | POA: Diagnosis not present

## 2021-06-26 DIAGNOSIS — Z7951 Long term (current) use of inhaled steroids: Secondary | ICD-10-CM | POA: Insufficient documentation

## 2021-06-26 MED ORDER — ALBUTEROL SULFATE (2.5 MG/3ML) 0.083% IN NEBU
2.5000 mg | INHALATION_SOLUTION | Freq: Once | RESPIRATORY_TRACT | Status: AC
  Administered 2021-06-26: 2.5 mg via RESPIRATORY_TRACT
  Filled 2021-06-26: qty 3

## 2021-07-02 ENCOUNTER — Other Ambulatory Visit: Payer: Self-pay | Admitting: Internal Medicine

## 2021-07-04 DIAGNOSIS — R9431 Abnormal electrocardiogram [ECG] [EKG]: Secondary | ICD-10-CM | POA: Diagnosis not present

## 2021-07-04 DIAGNOSIS — R0602 Shortness of breath: Secondary | ICD-10-CM | POA: Diagnosis not present

## 2021-07-04 DIAGNOSIS — I700 Atherosclerosis of aorta: Secondary | ICD-10-CM | POA: Diagnosis not present

## 2021-07-04 DIAGNOSIS — J432 Centrilobular emphysema: Secondary | ICD-10-CM | POA: Diagnosis not present

## 2021-07-04 DIAGNOSIS — R0789 Other chest pain: Secondary | ICD-10-CM | POA: Diagnosis not present

## 2021-07-08 ENCOUNTER — Ambulatory Visit: Payer: Medicare PPO | Admitting: Internal Medicine

## 2021-07-08 ENCOUNTER — Other Ambulatory Visit: Payer: Self-pay

## 2021-07-08 DIAGNOSIS — I10 Essential (primary) hypertension: Secondary | ICD-10-CM

## 2021-07-08 DIAGNOSIS — R252 Cramp and spasm: Secondary | ICD-10-CM

## 2021-07-08 DIAGNOSIS — R7989 Other specified abnormal findings of blood chemistry: Secondary | ICD-10-CM

## 2021-07-08 DIAGNOSIS — E039 Hypothyroidism, unspecified: Secondary | ICD-10-CM | POA: Diagnosis not present

## 2021-07-08 DIAGNOSIS — K219 Gastro-esophageal reflux disease without esophagitis: Secondary | ICD-10-CM | POA: Diagnosis not present

## 2021-07-08 DIAGNOSIS — E7800 Pure hypercholesterolemia, unspecified: Secondary | ICD-10-CM | POA: Diagnosis not present

## 2021-07-08 DIAGNOSIS — M810 Age-related osteoporosis without current pathological fracture: Secondary | ICD-10-CM | POA: Diagnosis not present

## 2021-07-08 DIAGNOSIS — J432 Centrilobular emphysema: Secondary | ICD-10-CM

## 2021-07-08 DIAGNOSIS — I700 Atherosclerosis of aorta: Secondary | ICD-10-CM

## 2021-07-08 NOTE — Progress Notes (Signed)
Patient ID: Savannah Cox, female   DOB: [birth year], 76 y.o.   MRN: [id number]   Subjective:    Patient ID: Savannah Cox, female    DOB: Dec 08, 1945, 76 y.o.   MRN: [id number]  This visit occurred during the SARS-CoV-2 public health emergency.  Safety protocols were in place, including screening questions prior to the visit, additional usage of staff PPE, and extensive cleaning of exam room while observing appropriate contact time as indicated for disinfecting solutions.   Patient here for a scheduled follow up.   Chief Complaint  Patient presents with   Hypertension   Hyperlipidemia   Hypothyroidism   .   HPI Recently evaluated by cardiology for sob. Previous stress test - normal EF - no evidence of ischemia 2020.  Elected to continue f/u with pulmonary.  She has joined silver sneakers.  No chest pain.  Breathing overall is stable.  No increased cough or congestion.  Using trelegy. No acid reflux.  No abdominal pain.  Bowels moving.  Some intermittent leg cramps.  Started otc potassium a couple of weeks ago.  Discussed labs and normal potassium.  Discussed staying hydrated and stretches.     Past Medical History:  Diagnosis Date   Allergic state    Anemia    Complication of anesthesia    Diverticulosis    GERD (gastroesophageal reflux disease)    History of hiatal hernia    History of kidney stones    Hypercholesterolemia    Hypertension    Hypothyroidism    Osteoporosis, postmenopausal    PONV (postoperative nausea and vomiting)    nauseated with colonoscopy   Shingles    Past Surgical History:  Procedure Laterality Date   BREAST BIOPSY Left 90s   benign   COLONOSCOPY WITH PROPOFOL N/A 01/28/2016   Procedure: COLONOSCOPY WITH PROPOFOL;  Surgeon: Lollie Sails, MD;  Location: [facility] ENDOSCOPY;  Service: Endoscopy;  Laterality: N/A;   EXCISION METACARPAL MASS Right 04/30/2017   Procedure: EXCISION MUCOID CYST FIFTH FINGER RIGHT HAND;  Surgeon: Hessie Knows, MD;   Location: [facility] ORS;  Service: Orthopedics;  Laterality: Right;   EYE SURGERY Bilateral 2010   cataracts   Family History  Problem Relation Age of Onset   Heart disease Father        died age 79 - myocardial infarction   Hypercholesterolemia Mother    Arthritis Mother    Heart attack Mother    Kidney cancer Other        aunt   Breast cancer Maternal Aunt    Colon cancer Neg Hx    Social History   Socioeconomic History   Marital status: Married    Spouse name: Not on file   Number of children: 3   Years of education: 16   Highest education level: Not on file  Occupational History   Occupation: retired Product manager: RETIRED  Tobacco Use   Smoking status: Former    Packs/day: 1.50    Years: 30.00    Pack years: 45.00    Types: Cigarettes    Quit date: 1990    Years since quitting: 33.0   Smokeless tobacco: Never  Vaping Use   Vaping Use: Never used  Substance and Sexual Activity   Alcohol use: Yes    Alcohol/week: 0.0 standard drinks    Comment: Rare drink, one drink per month   Drug use: No   Sexual activity: Yes  Other Topics Concern   Not on file  Social History Narrative   Not on file   Social Determinants of Health   Financial Resource Strain: Low Risk    Difficulty of Paying Living Expenses: Not hard at all  Food Insecurity: No Food Insecurity   Worried About Charity fundraiser in the Last Year: Never true   Arboriculturist in the Last Year: Never true  Transportation Needs: No Transportation Needs   Lack of Transportation (Medical): No   Lack of Transportation (Non-Medical): No  Physical Activity: Sufficiently Active   Days of Exercise per Week: 3 days   Minutes of Exercise per Session: 60 min  Stress: No Stress Concern Present   Feeling of Stress : Not at all  Social Connections: Unknown   Frequency of Communication with Friends and Family: More than three times a week   Frequency of Social Gatherings with Friends and Family: More than  three times a week   Attends Religious Services: Not on Electrical engineer or Organizations: Not on file   Attends Archivist Meetings: Not on file   Marital Status: Not on file     Review of Systems  Constitutional:  Negative for appetite change and unexpected weight change.  HENT:  Negative for congestion and sinus pressure.   Respiratory:  Negative for cough and chest tightness.        Breathing stable.   Cardiovascular:  Negative for chest pain, palpitations and leg swelling.  Gastrointestinal:  Negative for abdominal pain, diarrhea, nausea and vomiting.  Genitourinary:  Negative for difficulty urinating and dysuria.  Musculoskeletal:  Negative for joint swelling and myalgias.  Skin:  Negative for color change and rash.  Neurological:  Negative for dizziness, light-headedness and headaches.  Psychiatric/Behavioral:  Negative for agitation and dysphoric mood.       Objective:     BP 128/78    Pulse 85    Temp 97.7 F (36.5 C)    Resp 16    Ht 5\' 3"  (1.6 m)    Wt 141 lb 12.8 oz (64.3 kg)    SpO2 97%    BMI 25.12 kg/m  Wt Readings from Last 3 Encounters:  07/08/21 141 lb 12.8 oz (64.3 kg)  04/02/21 141 lb 6 oz (64.1 kg)  02/14/21 142 lb 6.4 oz (64.6 kg)    Physical Exam Vitals reviewed.  Constitutional:      General: She is not in acute distress.    Appearance: Normal appearance.  HENT:     Head: Normocephalic and atraumatic.     Right Ear: External ear normal.     Left Ear: External ear normal.  Eyes:     General: No scleral icterus.       Right eye: No discharge.        Left eye: No discharge.     Conjunctiva/sclera: Conjunctivae normal.  Neck:     Thyroid: No thyromegaly.  Cardiovascular:     Rate and Rhythm: Normal rate and regular rhythm.  Pulmonary:     Effort: No respiratory distress.     Breath sounds: Normal breath sounds. No wheezing.  Abdominal:     General: Bowel sounds are normal.     Palpations: Abdomen is soft.      Tenderness: There is no abdominal tenderness.  Musculoskeletal:        General: No swelling or tenderness.     Cervical back: Neck supple. No tenderness.  Lymphadenopathy:     Cervical: No cervical  adenopathy.  Skin:    Findings: No erythema or rash.  Neurological:     Mental Status: She is alert.  Psychiatric:        Mood and Affect: Mood normal.        Behavior: Behavior normal.     Outpatient Encounter Medications as of 07/08/2021  Medication Sig   levothyroxine (SYNTHROID) 75 MCG tablet TAKE 1 TABLET BY MOUTH EVERY DAY   lisinopril (ZESTRIL) 10 MG tablet TAKE 1 TABLET BY MOUTH EVERY DAY   loratadine (CLARITIN) 10 MG tablet Take 10 mg by mouth daily.    Multiple Vitamins-Minerals (CENTRUM SILVER ULTRA WOMENS PO) Take 1 tablet by mouth daily.    omeprazole (PRILOSEC) 40 MG capsule TAKE 1 CAPSULE (40 MG TOTAL) BY MOUTH ONCE DAILY TAKE 30 MINS BEFORE A MEAL   rosuvastatin (CRESTOR) 5 MG tablet TAKE 2 TABLETS BY MOUTH ON MONDAY, WEDNESDAY AND FRIDAY. TAKE 1 TABLET ON ALL OTHER DAYS   sertraline (ZOLOFT) 50 MG tablet TAKE 1 TABLET BY MOUTH EVERY DAY   TRELEGY ELLIPTA 100-62.5-25 MCG/INH AEPB TAKE 1 PUFF BY MOUTH EVERY DAY   triamcinolone lotion (KENALOG) 0.1 % Apply 1 application topically daily as needed. Apply to affected areas of scalp for itch   [DISCONTINUED] albuterol (VENTOLIN HFA) 108 (90 Base) MCG/ACT inhaler Inhale 2 puffs into the lungs every 6 (six) hours as needed for wheezing or shortness of breath.   No facility-administered encounter medications on file as of 07/08/2021.     Lab Results  Component Value Date   WBC 4.5 03/06/2021   HGB 13.4 03/06/2021   HCT 40.0 03/06/2021   PLT 189.0 03/06/2021   GLUCOSE 94 06/05/2021   CHOL 204 (H) 06/05/2021   TRIG 80.0 06/05/2021   HDL 76.60 06/05/2021   LDLDIRECT 165.9 05/11/2013   LDLCALC 111 (H) 06/05/2021   ALT 25 06/05/2021   AST 38 (H) 06/05/2021   NA 142 06/05/2021   K 3.9 06/05/2021   CL 106 06/05/2021    CREATININE 0.80 06/05/2021   BUN 9 06/05/2021   CO2 29 06/05/2021   TSH 0.65 10/09/2020   INR 0.96 04/14/2016    Pulmonary Function Test [facility] Only  Result Date: 06/27/2021 Spirometry Data Is Acceptable and Reproducible No obvious evidence of Obstructive Airways disease or Restrictive Lung disease Consider outpatient follow up with Pulmonary if needed. Clinical Correlation Advised       Assessment & Plan:   Problem List Items Addressed This Visit     Abnormal liver function test    Abdominal ultrasound - no liver abnormality.  Recheck liver panel with next labs.       Aortic atherosclerosis (HCC)    Continue crestor.       Centrilobular emphysema ([place])    Evaluated by pulmonary as outlined.   Continue trelegy.  SOB with exertion as outlined.  Saw cardiology.  No further cardiac w/up warranted at this time.  Follow.  F/u with pulmonary.       GERD (gastroesophageal reflux disease)    No acid reflux reported.  Continues on prilosec.       Hypercholesterolemia    Continue crestor.  Low cholesterol diet and exercise.  Follow lipid panel and liver function tests.   Lab Results  Component Value Date   CHOL 204 (H) 06/05/2021   HDL 76.60 06/05/2021   LDLCALC 111 (H) 06/05/2021   LDLDIRECT 165.9 05/11/2013   TRIG 80.0 06/05/2021   CHOLHDL 3 06/05/2021  Relevant Orders   CBC with Differential/Platelet   Hepatic function panel   Lipid panel   Hypertension    Continue lisinopril.  Blood pressure doing well.  Follow pressure.  Follow metabolic panel.       Relevant Orders   Basic metabolic panel   Hypothyroidism    On thyroid replacement.  Follow tsh.        Relevant Orders   TSH   Leg cramps    Stop otc potassium supplements.  Stay hydrated.  Stretches.  Follow.       Osteoporosis    Reclast.  Followed by endocrinology.         Einar Pheasant, MD

## 2021-07-09 ENCOUNTER — Ambulatory Visit: Payer: Medicare PPO | Admitting: Dermatology

## 2021-07-09 ENCOUNTER — Encounter: Payer: Self-pay | Admitting: Dermatology

## 2021-07-09 DIAGNOSIS — B078 Other viral warts: Secondary | ICD-10-CM

## 2021-07-09 NOTE — Patient Instructions (Addendum)
Cryotherapy Aftercare  Wash gently with soap and water everyday.   Apply Vaseline and Band-Aid daily until healed.   Prior to procedure, discussed risks of blister formation, small wound, skin dyspigmentation, or rare scar following cryotherapy. Recommend Vaseline ointment to treated areas while healing.   Cantharidin is a blistering agent that comes from a beetle.  It needs to be washed off in about 4 hours after application.  Although it is painless when applied in office, it may cause symptoms of mild pain and burning several hours later.  Treated areas will swell and turn red, and blisters may form.  Vaseline and a bandaid may be applied until wound has healed.  Once healed, the skin may remain temporarily discolored.  It can take weeks to months for pigmentation to return to normal.  The molluscum may resolve with this topical treatment, but often, additional treatments may be required to clear molluscum.  It is recommended to keep the skin well-moisturized and avoid scratching affected area to help prevent spread of the molluscum.   If You Need Anything After Your Visit  If you have any questions or concerns for your doctor, please call our main line at [phone] and press option 4 to reach your doctor's medical assistant. If no one answers, please leave a voicemail as directed and we will return your call as soon as possible. Messages left after 4 pm will be answered the following business day.   You may also send [place] a message via [place]. We typically respond to MyChart messages within 1-2 business days.  For prescription refills, please ask your pharmacy to contact our office. Our fax number is [phone].  If you have an urgent issue when the clinic is closed that cannot wait until the next business day, you can page your doctor at the number below.    Please note that while we do our best to be available for urgent issues outside of office hours, we are not available 24/7.   If  you have an urgent issue and are unable to reach [place], you may choose to seek medical care at your doctor's office, retail clinic, urgent care center, or emergency room.  If you have a medical emergency, please immediately call 911 or go to the emergency department.  Pager Numbers  - Dr. Nehemiah Massed: [phone]  - Dr. Laurence Ferrari: [phone]  - Dr. Nicole Kindred: [phone]  In the event of inclement weather, please call our main line at [phone] for an update on the status of any delays or closures.  Dermatology Medication Tips: Please keep the boxes that topical medications come in in order to help keep track of the instructions about where and how to use these. Pharmacies typically print the medication instructions only on the boxes and not directly on the medication tubes.   If your medication is too expensive, please contact our office at [phone] option 4 or send [place] a message through [place].   We are unable to tell what your co-pay for medications will be in advance as this is different depending on your insurance coverage. However, we may be able to find a substitute medication at lower cost or fill out paperwork to get insurance to cover a needed medication.   If a prior authorization is required to get your medication covered by your insurance company, please allow [place] 1-2 business days to complete this process.  Drug prices often vary depending on where the prescription is filled and some pharmacies may offer cheaper prices.  The website www.goodrx.com contains coupons for medications through different pharmacies. The prices here do not account for what the cost may be with help from insurance (it may be cheaper with your insurance), but the website can give you the price if you did not use any insurance.  - You can print the associated coupon and take it with your prescription to the pharmacy.  - You may also stop by our office during regular business hours and pick up a GoodRx  coupon card.  - If you need your prescription sent electronically to a different pharmacy, notify our office through [facility] or by phone at [phone] option 4.     Si Usted Necesita Algo Despus de Su Visita  Tambin puede enviarnos un mensaje a travs de Pharmacist, community. Por lo general respondemos a los mensajes de MyChart en el transcurso de 1 a 2 das hbiles.  Para renovar recetas, por favor pida a su farmacia que se ponga en contacto con nuestra oficina. Harland Dingwall de fax es [place] [phone].  Si tiene un asunto urgente cuando la clnica est cerrada y que no puede esperar hasta el siguiente da hbil, puede llamar/localizar a su doctor(a) al nmero que aparece a continuacin.   Por favor, tenga en cuenta que aunque hacemos todo lo posible para estar disponibles para asuntos urgentes fuera del horario de [place], no estamos disponibles las 24 horas del da, los 7 das de la [place].   Si tiene un problema urgente y no puede comunicarse con nosotros, puede optar por buscar atencin mdica  en el consultorio de su doctor(a), en una clnica privada, en un centro de atencin urgente o en una sala de emergencias.  Si tiene Engineering geologist, por favor llame inmediatamente al 911 o vaya a la sala de emergencias.  Nmeros de bper  - Dr. Nehemiah Massed: [phone]  - Dra. Moye: [phone]  - Dra. Nicole Kindred: [phone]  En caso de inclemencias del [place], por favor llame a Johnsie Kindred principal al [phone] para una actualizacin sobre el [place] de cualquier retraso o cierre.  Consejos para la medicacin en dermatologa: Por favor, guarde las cajas en las que vienen los medicamentos de uso tpico para ayudarle a seguir las instrucciones sobre dnde y cmo usarlos. Las farmacias generalmente imprimen las instrucciones del medicamento slo en las cajas y no directamente en los tubos del [place].   Si su medicamento es muy caro, por favor, pngase en contacto con  Zigmund Daniel llamando al [phone] y presione la opcin 4 o envenos un mensaje a travs de Pharmacist, community.   No podemos decirle cul ser su copago por los medicamentos por adelantado ya que esto es diferente dependiendo de la cobertura de su seguro. Sin embargo, es posible que podamos encontrar un medicamento sustituto a Electrical engineer un formulario para que el seguro cubra el medicamento que se considera necesario.   Si se requiere una autorizacin previa para que su compaa de seguros [place] su medicamento, por favor permtanos de 1 a 2 das hbiles para completar este proceso.  Los precios de los medicamentos varan con frecuencia dependiendo del Environmental consultant de dnde se surte la receta y alguna farmacias pueden ofrecer precios ms baratos.  El sitio web www.goodrx.com tiene cupones para medicamentos de Airline pilot. Los precios aqu no tienen en cuenta lo que podra costar con la ayuda del seguro (puede ser ms barato con su seguro), pero el sitio web puede darle el precio si no utiliz Research scientist (physical sciences).  -  Puede imprimir el cupn correspondiente y llevarlo con su receta a la farmacia.  - Tambin puede pasar por nuestra oficina durante el horario de atencin regular y Charity fundraiser una tarjeta de cupones de GoodRx.  - Si necesita que su receta se enve electrnicamente a una farmacia diferente, informe a nuestra oficina a travs de MyChart de [place] o por telfono llamando al [phone] y presione la opcin 4.

## 2021-07-09 NOTE — Progress Notes (Signed)
° °  Follow-Up Visit   Subjective  Savannah Cox is a 76 y.o. female who presents for the following: Warts (Right upper arm. Lesion has grown back after treatment).  The following portions of the chart were reviewed this encounter and updated as appropriate:  Tobacco   Allergies   Meds   Problems   Med Hx   Surg Hx   Fam Hx       Review of Systems: No other skin or systemic complaints except as noted in HPI or Assessment and Plan.   Objective  Well appearing patient in no apparent distress; mood and affect are within normal limits.  A focused examination was performed including face, right upper arm. Relevant physical exam findings are noted in the Assessment and Plan.  Right Upper Arm - Posterior x1 Verrucous papules -- Discussed viral etiology and contagion.       Assessment & Plan  Other viral warts Right Upper Arm - Posterior x1  Discussed viral etiology and risk of spread.  Discussed multiple treatments may be required to clear warts.  Discussed possible post-treatment dyspigmentation and risk of recurrence.  Cantharidin Plus is a blistering agent that comes from a beetle.  It needs to be washed off in about 4 hours after application.  Although it is painless when applied in office, it may cause symptoms of mild pain and burning several hours later.  Treated areas will swell and turn red, and blisters may form.  Vaseline and a bandaid may be applied until wound has healed.  Once healed, the skin may remain temporarily discolored.  It can take weeks to months for pigmentation to return to normal.  Advised to wash off with soap and water in 4 hours or sooner if it becomes tender before then.  Prior to procedure, discussed risks of blister formation, small wound, skin dyspigmentation, or rare scar following cryotherapy. Recommend Vaseline ointment to treated areas while healing.   Destruction of lesion - Right Upper Arm - Posterior x1  Destruction method: chemical removal    Informed consent: discussed and consent obtained   Timeout:  patient name, date of birth, surgical site, and procedure verified Chemical destruction method: cantharidin   Chemical destruction method comment:  Cantharone plus, Squaric Acid 3% Procedure instructions: patient instructed to wash and dry area   Outcome: patient tolerated procedure well with no complications   Post-procedure details: wound care instructions given   Additional details:  Wash off in 3-4 hours or sooner as needed.  Destruction of lesion - Right Upper Arm - Posterior x1  Destruction method: cryotherapy   Informed consent: discussed and consent obtained   Lesion destroyed using liquid nitrogen: Yes   Region frozen until ice ball extended beyond lesion: Yes   Outcome: patient tolerated procedure well with no complications   Post-procedure details: wound care instructions given     Return for wart recheck 4-6 weeks.  I, Emelia Salisbury, CMA, am acting as scribe for Forest Gleason, MD.  Documentation: I have reviewed the above documentation for accuracy and completeness, and I agree with the above.  Forest Gleason, MD

## 2021-07-12 ENCOUNTER — Encounter: Payer: Self-pay | Admitting: Dermatology

## 2021-07-14 ENCOUNTER — Encounter: Payer: Self-pay | Admitting: Internal Medicine

## 2021-07-14 DIAGNOSIS — R252 Cramp and spasm: Secondary | ICD-10-CM | POA: Insufficient documentation

## 2021-07-14 NOTE — Assessment & Plan Note (Signed)
Continue crestor.  Low cholesterol diet and exercise.  Follow lipid panel and liver function tests.   Lab Results  Component Value Date   CHOL 204 (H) 06/05/2021   HDL 76.60 06/05/2021   LDLCALC 111 (H) 06/05/2021   LDLDIRECT 165.9 05/11/2013   TRIG 80.0 06/05/2021   CHOLHDL 3 06/05/2021

## 2021-07-14 NOTE — Assessment & Plan Note (Signed)
Reclast.  Followed by endocrinology.  

## 2021-07-14 NOTE — Assessment & Plan Note (Signed)
Stop otc potassium supplements.  Stay hydrated.  Stretches.  Follow.

## 2021-07-14 NOTE — Assessment & Plan Note (Signed)
Continue lisinopril.  Blood pressure doing well.  Follow pressure.  Follow metabolic panel.  

## 2021-07-14 NOTE — Assessment & Plan Note (Signed)
Evaluated by pulmonary as outlined.   Continue trelegy.  SOB with exertion as outlined.  Saw cardiology.  No further cardiac w/up warranted at this time.  Follow.  F/u with pulmonary.

## 2021-07-14 NOTE — Assessment & Plan Note (Signed)
Continue crestor 

## 2021-07-14 NOTE — Assessment & Plan Note (Signed)
Abdominal ultrasound - no liver abnormality.  Recheck liver panel with next labs.  

## 2021-07-14 NOTE — Assessment & Plan Note (Signed)
On thyroid replacement.  Follow tsh.  

## 2021-07-14 NOTE — Assessment & Plan Note (Signed)
No acid reflux reported.  Continues on prilosec.  

## 2021-07-18 ENCOUNTER — Other Ambulatory Visit: Payer: Self-pay | Admitting: Internal Medicine

## 2021-07-23 DIAGNOSIS — M9905 Segmental and somatic dysfunction of pelvic region: Secondary | ICD-10-CM | POA: Diagnosis not present

## 2021-07-23 DIAGNOSIS — M5136 Other intervertebral disc degeneration, lumbar region: Secondary | ICD-10-CM | POA: Diagnosis not present

## 2021-07-23 DIAGNOSIS — M9903 Segmental and somatic dysfunction of lumbar region: Secondary | ICD-10-CM | POA: Diagnosis not present

## 2021-07-23 DIAGNOSIS — M62830 Muscle spasm of back: Secondary | ICD-10-CM | POA: Diagnosis not present

## 2021-07-26 ENCOUNTER — Other Ambulatory Visit (INDEPENDENT_AMBULATORY_CARE_PROVIDER_SITE_OTHER): Payer: Medicare PPO

## 2021-07-26 ENCOUNTER — Other Ambulatory Visit: Payer: Self-pay

## 2021-07-26 DIAGNOSIS — E7800 Pure hypercholesterolemia, unspecified: Secondary | ICD-10-CM

## 2021-07-26 DIAGNOSIS — I10 Essential (primary) hypertension: Secondary | ICD-10-CM

## 2021-07-26 DIAGNOSIS — R7989 Other specified abnormal findings of blood chemistry: Secondary | ICD-10-CM

## 2021-07-26 DIAGNOSIS — E039 Hypothyroidism, unspecified: Secondary | ICD-10-CM | POA: Diagnosis not present

## 2021-07-26 LAB — CBC WITH DIFFERENTIAL/PLATELET
Basophils Absolute: 0 10*3/uL (ref 0.0–0.1)
Basophils Relative: 1.1 % (ref 0.0–3.0)
Eosinophils Absolute: 0.1 10*3/uL (ref 0.0–0.7)
Eosinophils Relative: 2.8 % (ref 0.0–5.0)
HCT: 40 % (ref 36.0–46.0)
Hemoglobin: 13 g/dL (ref 12.0–15.0)
Lymphocytes Relative: 28 % (ref 12.0–46.0)
Lymphs Abs: 1.2 10*3/uL (ref 0.7–4.0)
MCHC: 32.5 g/dL (ref 30.0–36.0)
MCV: 98.1 fl (ref 78.0–100.0)
Monocytes Absolute: 0.6 10*3/uL (ref 0.1–1.0)
Monocytes Relative: 15.1 % — ABNORMAL HIGH (ref 3.0–12.0)
Neutro Abs: 2.2 10*3/uL (ref 1.4–7.7)
Neutrophils Relative %: 53 % (ref 43.0–77.0)
Platelets: 206 10*3/uL (ref 150.0–400.0)
RBC: 4.08 Mil/uL (ref 3.87–5.11)
RDW: 13.9 % (ref 11.5–15.5)
WBC: 4.2 10*3/uL (ref 4.0–10.5)

## 2021-07-26 LAB — LIPID PANEL
Cholesterol: 191 mg/dL (ref 0–200)
HDL: 85.4 mg/dL (ref >39.00)
LDL Cholesterol: 94 mg/dL (ref 0–99)
NonHDL: 105.32
Total CHOL/HDL Ratio: 2
Triglycerides: 56 mg/dL (ref 0.0–149.0)
VLDL: 11.2 mg/dL (ref 0.0–40.0)

## 2021-07-26 LAB — HEPATIC FUNCTION PANEL
ALT: 25 U/L (ref 0–35)
AST: 41 U/L — ABNORMAL HIGH (ref 0–37)
Albumin: 3.8 g/dL (ref 3.5–5.2)
Alkaline Phosphatase: 49 U/L (ref 39–117)
Bilirubin, Direct: 0.1 mg/dL (ref 0.0–0.3)
Total Bilirubin: 0.5 mg/dL (ref 0.2–1.2)
Total Protein: 5.7 g/dL — ABNORMAL LOW (ref 6.0–8.3)

## 2021-07-26 LAB — BASIC METABOLIC PANEL
BUN: 13 mg/dL (ref 6–23)
CO2: 29 mEq/L (ref 19–32)
Calcium: 9 mg/dL (ref 8.4–10.5)
Chloride: 107 mEq/L (ref 96–112)
Creatinine, Ser: 0.75 mg/dL (ref 0.40–1.20)
GFR: 78.85 mL/min (ref >60.00)
Glucose, Bld: 93 mg/dL (ref 70–99)
Potassium: 4.1 mEq/L (ref 3.5–5.1)
Sodium: 142 mEq/L (ref 135–145)

## 2021-07-26 LAB — TSH: TSH: 1.02 u[IU]/mL (ref 0.35–5.50)

## 2021-07-27 ENCOUNTER — Other Ambulatory Visit: Payer: Self-pay | Admitting: Internal Medicine

## 2021-07-31 NOTE — Addendum Note (Signed)
Addended by: Lars Masson on: 07/31/2021 09:01 AM   Modules accepted: Orders

## 2021-08-06 ENCOUNTER — Encounter: Payer: Self-pay | Admitting: Dermatology

## 2021-08-06 ENCOUNTER — Ambulatory Visit: Payer: Medicare PPO | Admitting: Dermatology

## 2021-08-06 ENCOUNTER — Other Ambulatory Visit: Payer: Self-pay

## 2021-08-06 DIAGNOSIS — B078 Other viral warts: Secondary | ICD-10-CM | POA: Diagnosis not present

## 2021-08-06 NOTE — Patient Instructions (Addendum)
Cryotherapy Aftercare  Wash gently with soap and water everyday.   Apply Vaseline and Band-Aid daily until healed.   Instructions for After In-Office Application of Cantharidin  1. This is a strong medicine; please follow ALL instructions.  2. Gently wash off with soap and water in four hours or sooner s directed by your physician.  3. **WARNING** this medicine can cause severe blistering, blood blisters, infection, and/or scarring if it is not washed off as directed.  4. Your progress will be rechecked in 1-2 months; call sooner if there are any questions or problems.  If You Need Anything After Your Visit  If you have any questions or concerns for your doctor, please call our main line at [phone] and press option 4 to reach your doctor's medical assistant. If no one answers, please leave a voicemail as directed and we will return your call as soon as possible. Messages left after 4 pm will be answered the following business day.   You may also send [place] a message via [place]. We typically respond to MyChart messages within 1-2 business days.  For prescription refills, please ask your pharmacy to contact our office. Our fax number is [phone].  If you have an urgent issue when the clinic is closed that cannot wait until the next business day, you can page your doctor at the number below.    Please note that while we do our best to be available for urgent issues outside of office hours, we are not available 24/7.   If you have an urgent issue and are unable to reach [place], you may choose to seek medical care at your doctor's office, retail clinic, urgent care center, or emergency room.  If you have a medical emergency, please immediately call 911 or go to the emergency department.  Pager Numbers  - Dr. Nehemiah Massed: [phone]  - Dr. Laurence Ferrari: [phone]  - Dr. Nicole Kindred: [phone]  In the event of inclement weather, please call our main line at [phone] for an update on  the status of any delays or closures.  Dermatology Medication Tips: Please keep the boxes that topical medications come in in order to help keep track of the instructions about where and how to use these. Pharmacies typically print the medication instructions only on the boxes and not directly on the medication tubes.   If your medication is too expensive, please contact our office at [phone] option 4 or send [place] a message through [place].   We are unable to tell what your co-pay for medications will be in advance as this is different depending on your insurance coverage. However, we may be able to find a substitute medication at lower cost or fill out paperwork to get insurance to cover a needed medication.   If a prior authorization is required to get your medication covered by your insurance company, please allow [place] 1-2 business days to complete this process.  Drug prices often vary depending on where the prescription is filled and some pharmacies may offer cheaper prices.  The website www.goodrx.com contains coupons for medications through different pharmacies. The prices here do not account for what the cost may be with help from insurance (it may be cheaper with your insurance), but the website can give you the price if you did not use any insurance.  - You can print the associated coupon and take it with your prescription to the pharmacy.  - You may also stop by our office during regular business hours and pick  up a GoodRx coupon card.  - If you need your prescription sent electronically to a different pharmacy, notify our office through [facility] or by phone at [phone] option 4.     Si Usted Necesita Algo Despus de Su Visita  Tambin puede enviarnos un mensaje a travs de Pharmacist, community. Por lo general respondemos a los mensajes de MyChart en el transcurso de 1 a 2 das hbiles.  Para renovar recetas, por favor pida a su farmacia que se ponga en contacto con nuestra  oficina. Harland Dingwall de fax es [place] [phone].  Si tiene un asunto urgente cuando la clnica est cerrada y que no puede esperar hasta el siguiente da hbil, puede llamar/localizar a su doctor(a) al nmero que aparece a continuacin.   Por favor, tenga en cuenta que aunque hacemos todo lo posible para estar disponibles para asuntos urgentes fuera del horario de [place], no estamos disponibles las 24 horas del da, los 7 das de la [place].   Si tiene un problema urgente y no puede comunicarse con nosotros, puede optar por buscar atencin mdica  en el consultorio de su doctor(a), en una clnica privada, en un centro de atencin urgente o en una sala de emergencias.  Si tiene Engineering geologist, por favor llame inmediatamente al 911 o vaya a la sala de emergencias.  Nmeros de bper  - Dr. Nehemiah Massed: [phone]  - Dra. Moye: [phone]  - Dra. Nicole Kindred: [phone]  En caso de inclemencias del [place], por favor llame a Johnsie Kindred principal al [phone] para una actualizacin sobre el [place] de cualquier retraso o cierre.  Consejos para la medicacin en dermatologa: Por favor, guarde las cajas en las que vienen los medicamentos de uso tpico para ayudarle a seguir las instrucciones sobre dnde y cmo usarlos. Las farmacias generalmente imprimen las instrucciones del medicamento slo en las cajas y no directamente en los tubos del [place].   Si su medicamento es muy caro, por favor, pngase en contacto con Zigmund Daniel llamando al [phone] y presione la opcin 4 o envenos un mensaje a travs de Pharmacist, community.   No podemos decirle cul ser su copago por los medicamentos por adelantado ya que esto es diferente dependiendo de la cobertura de su seguro. Sin embargo, es posible que podamos encontrar un medicamento sustituto a Electrical engineer un formulario para que el seguro cubra el medicamento que se considera necesario.   Si se requiere una autorizacin previa para  que su compaa de seguros [place] su medicamento, por favor permtanos de 1 a 2 das hbiles para completar este proceso.  Los precios de los medicamentos varan con frecuencia dependiendo del Environmental consultant de dnde se surte la receta y alguna farmacias pueden ofrecer precios ms baratos.  El sitio web www.goodrx.com tiene cupones para medicamentos de Airline pilot. Los precios aqu no tienen en cuenta lo que podra costar con la ayuda del seguro (puede ser ms barato con su seguro), pero el sitio web puede darle el precio si no utiliz Research scientist (physical sciences).  - Puede imprimir el cupn correspondiente y llevarlo con su receta a la farmacia.  - Tambin puede pasar por nuestra oficina durante el horario de atencin regular y Charity fundraiser una tarjeta de cupones de GoodRx.  - Si necesita que su receta se enve electrnicamente a una farmacia diferente, informe a nuestra oficina a travs de MyChart de  o por telfono llamando al [phone] y presione la opcin 4.

## 2021-08-06 NOTE — Progress Notes (Signed)
° °  Follow-Up Visit   Subjective  Savannah Cox is a 76 y.o. female who presents for the following: Follow-up (Patient here today for 4 week wart follow up at right upper arm. Wart treated previously with LN2, squaric acid 3% and cantharidin plus. Wart improved. ).  The following portions of the chart were reviewed this encounter and updated as appropriate:   Tobacco   Allergies   Meds   Problems   Med Hx   Surg Hx   Fam Hx       Review of Systems:  No other skin or systemic complaints except as noted in HPI or Assessment and Plan.  Objective  Well appearing patient in no apparent distress; mood and affect are within normal limits.  A focused examination was performed including right arm. Relevant physical exam findings are noted in the Assessment and Plan.  Right Upper Arm Verrucous papules -- Discussed viral etiology and contagion.     Assessment & Plan  Other viral warts Right Upper Arm  Discussed viral etiology and risk of spread.  Discussed multiple treatments may be required to clear warts.  Discussed possible post-treatment dyspigmentation and risk of recurrence.  Squaric Acid 3% applied to warts today. Prior to application reviewed risk of inflammation and irritation.  Prior to procedure, discussed risks of blister formation, small wound, skin dyspigmentation, or rare scar following cryotherapy. Recommend Vaseline ointment to treated areas while healing.  Cantharidin Plus is a blistering agent that comes from a beetle.  It needs to be washed off in about 4 hours after application.  Although it is painless when applied in office, it may cause symptoms of mild pain and burning several hours later.  Treated areas will swell and turn red, and blisters may form.  Vaseline and a bandaid may be applied until wound has healed.  Once healed, the skin may remain temporarily discolored.  It can take weeks to months for pigmentation to return to normal.  Advised to wash off with soap and  water in 4 hours or sooner if it becomes tender before then.    Destruction of lesion - Right Upper Arm  Destruction method: cryotherapy   Informed consent: discussed and consent obtained   Lesion destroyed using liquid nitrogen: Yes   Cryotherapy cycles:  2 Outcome: patient tolerated procedure well with no complications   Post-procedure details: wound care instructions given    Destruction of lesion - Right Upper Arm  Destruction method: chemical removal   Informed consent: discussed and consent obtained   Timeout:  patient name, date of birth, surgical site, and procedure verified Chemical destruction method comment:  Canthardin plus Application time:  4 hours Procedure instructions: patient instructed to wash and dry area   Outcome: patient tolerated procedure well with no complications   Post-procedure details: wound care instructions given     Return for as scheduled.  Graciella Belton, RMA, am acting as scribe for Forest Gleason, MD .  Documentation: I have reviewed the above documentation for accuracy and completeness, and I agree with the above.  Forest Gleason, MD

## 2021-08-20 DIAGNOSIS — M9905 Segmental and somatic dysfunction of pelvic region: Secondary | ICD-10-CM | POA: Diagnosis not present

## 2021-08-20 DIAGNOSIS — M62830 Muscle spasm of back: Secondary | ICD-10-CM | POA: Diagnosis not present

## 2021-08-20 DIAGNOSIS — M9903 Segmental and somatic dysfunction of lumbar region: Secondary | ICD-10-CM | POA: Diagnosis not present

## 2021-08-20 DIAGNOSIS — M5136 Other intervertebral disc degeneration, lumbar region: Secondary | ICD-10-CM | POA: Diagnosis not present

## 2021-08-21 ENCOUNTER — Other Ambulatory Visit: Payer: Self-pay

## 2021-08-21 ENCOUNTER — Encounter: Payer: Self-pay | Admitting: Pulmonary Disease

## 2021-08-21 ENCOUNTER — Ambulatory Visit: Payer: Medicare PPO | Admitting: Pulmonary Disease

## 2021-08-21 VITALS — BP 110/74 | HR 79 | Temp 97.7°F | Ht 63.0 in | Wt 143.8 lb

## 2021-08-21 DIAGNOSIS — J449 Chronic obstructive pulmonary disease, unspecified: Secondary | ICD-10-CM

## 2021-08-21 DIAGNOSIS — R0602 Shortness of breath: Secondary | ICD-10-CM

## 2021-08-21 DIAGNOSIS — Z87891 Personal history of nicotine dependence: Secondary | ICD-10-CM | POA: Diagnosis not present

## 2021-08-21 NOTE — Progress Notes (Signed)
? ?Subjective:  ? ? Patient ID: Savannah Cox, female    DOB: 03/11/1946, 76 y.o.   MRN: [id number] ?Patient Care Team: ?Einar Pheasant, MD as PCP - General (Internal Medicine) ? ?Chief Complaint  ?Patient presents with  ? Follow-up  ? ?HPI ?Savannah Cox is a 76 year old former smoker who follows for the issue of COPD with chronic bronchitis.  This is a scheduled visit.  Last seen here  17 January 2021.  She continues to use Trelegy Ellipta consistently.  She notes that this medication helps her and she has not had any flareups/exacerbations of bronchitis/COPD since her last visit.  Prior to being switched to Trelegy she had been on Stiolto and she noted issues with congestion and cough.  All of these issues have now resolved on the Trelegy.  [facility] and does treadmill workouts and can do over a mile on the treadmill without shortness of breath.She has not had any fevers, chills or sweats.  No hemoptysis.  No orthopnea, paroxysmal nocturnal dyspnea, chest pain, calf pain or tenderness.  Overall she feels well and looks well.  We discussed her latest PFTs that were obtained in January.  Compared to the prior she remains very stable in her lung function.  He has not had to use her rescue inhaler at all. ? ?She has a very remote former smoker therefore it does not qualify for lung cancer screening due to having quit cigarettes over 20 years ago. ? ?DATA: ?01/30/2020 PFTs: FEV1 1.99 L or 94% predicted, FVC 2.40 L or 85% predicted, no bronchodilator response.  FEV1/FVC 83%.  Lung volumes are low end of normal.  Diffusion capacity mildly reduced ?06/26/2021 PFTs: FEV1 2.09 L or 104% predicted, FVC 2.39 L or 90% predicted, FEV1/FVC 87%, no bronchodilator response.  Lung volumes at low end normal, small airways component.  Diffusion capacity mildly reduced.  Consistent with very mild COPD ? ?Review of Systems ?A 10 point review of systems was performed and it is as noted above otherwise negative. ? ?Patient Active  Problem List  ? Diagnosis Date Noted  ? Leg cramps 07/14/2021  ? Kidney cysts 03/20/2021  ? Chest pain 02/14/2021  ? Monocytosis 02/14/2021  ? Abnormal liver function test 02/14/2021  ? Back pain 10/11/2020  ? Centrilobular emphysema ([place]) 02/13/2020  ? Former smoker 02/13/2020  ? Sleeping difficulties 01/22/2020  ? Leukopenia 09/12/2019  ? SOB (shortness of breath) 06/06/2019  ? Chest tightness 03/20/2019  ? Osteoporosis 07/12/2018  ? Bilateral hip pain 06/12/2018  ? Aortic atherosclerosis ([place]) 10/10/2017  ? Finger pain, right 07/05/2017  ? Acute colitis 04/17/2016  ? Diarrhea 04/17/2016  ? Hypokalemia 04/17/2016  ? Headache 04/17/2016  ? GIB (gastrointestinal bleeding) 04/14/2016  ? History of colonic polyps 01/31/2016  ? Change in bowel movement 10/21/2015  ? Pain of both thumbs 01/14/2015  ? Right hip pain 01/10/2015  ? Health care maintenance 09/04/2014  ? Abnormal mammogram 09/25/2013  ? Abdominal pain 02/22/2013  ? Anemia 05/01/2012  ? GERD (gastroesophageal reflux disease) 05/01/2012  ? Hematuria 05/01/2012  ? Hypothyroidism 05/01/2012  ? Hypercholesterolemia 05/01/2012  ? Hypertension 05/01/2012  ? ?Social History  ? ?Tobacco Use  ? Smoking status: Former  ?  Packs/day: 1.50  ?  Years: 30.00  ?  Pack years: 45.00  ?  Types: Cigarettes  ?  Quit date: 86  ?  Years since quitting: 33.2  ? Smokeless tobacco: Never  ?Substance Use Topics  ? Alcohol use: Yes  ?  Alcohol/week: 0.0 standard drinks  ?  Comment: Rare drink, one drink per month  ? ?Allergies  ?Allergen Reactions  ? Augmentin [Amoxicillin-Pot Clavulanate] Rash  ?  Has patient had a PCN reaction causing immediate rash, facial/tongue/throat swelling, SOB or lightheadedness with hypotension: No ?Has patient had a PCN reaction causing severe rash involving mucus membranes or skin necrosis: No ?Has patient had a PCN reaction that required hospitalization: No ?Has patient had a PCN reaction occurring within the last 10 years: No ?If all of the above  answers are "NO", then may proceed with Cephalosporin use. ?  ? ?Current Meds  ?Medication Sig  ? levothyroxine (SYNTHROID) 75 MCG tablet TAKE 1 TABLET BY MOUTH EVERY DAY  ? lisinopril (ZESTRIL) 10 MG tablet TAKE 1 TABLET BY MOUTH EVERY DAY  ? loratadine (CLARITIN) 10 MG tablet Take 10 mg by mouth daily.   ? Multiple Vitamins-Minerals (CENTRUM SILVER ULTRA WOMENS PO) Take 1 tablet by mouth daily.   ? omeprazole (PRILOSEC) 40 MG capsule TAKE 1 CAPSULE (40 MG TOTAL) BY MOUTH ONCE DAILY TAKE 30 MINS BEFORE A MEAL  ? rosuvastatin (CRESTOR) 5 MG tablet TAKE 2 TABLETS BY MOUTH ON MONDAY, WEDNESDAY AND FRIDAY. TAKE 1 TABLET ON ALL OTHER DAYS  ? sertraline (ZOLOFT) 50 MG tablet TAKE 1 TABLET BY MOUTH EVERY DAY  ? TRELEGY ELLIPTA 100-62.5-25 MCG/INH AEPB TAKE 1 PUFF BY MOUTH EVERY DAY  ? triamcinolone lotion (KENALOG) 0.1 % Apply 1 application topically daily as needed. Apply to affected areas of scalp for itch  ? ?Immunization History  ?Administered Date(s) Administered  ? DTaP 12/09/2010  ? Fluad Quad(high Dose 65+) 02/14/2021  ? Influenza Split 04/29/2012  ? Influenza, High Dose Seasonal PF 02/19/2016, 03/19/2017, 06/03/2018, 02/28/2020  ? Influenza,inj,Quad PF,6+ Mos 02/21/2013, 02/13/2014, 05/14/2015  ? Influenza-Unspecified 02/16/2019  ? PFIZER(Purple Top)SARS-COV-2 Vaccination 07/31/2019, 08/23/2019, 04/05/2020  ? Pneumococcal Conjugate-13 04/28/2013  ? Pneumococcal Polysaccharide-23 02/13/2014  ? Zoster Recombinat (Shingrix) 02/01/2019, 05/16/2019  ? Zoster, Live 10/12/2011  ? ?   ?Objective:  ? Physical Exam ?BP 110/74 (BP Location: Left Arm, Patient Position: Sitting, Cuff Size: Normal)   Pulse 79   Temp 97.7 ?F (36.5 ?C) (Oral)   Ht '5\' 3"'$  (1.6 m)   Wt 143 lb 12.8 oz (65.2 kg)   SpO2 95%   BMI 25.47 kg/m?  ?GENERAL: Well-developed, well-nourished, no acute distress.  Fully ambulatory. ?HEAD: Normocephalic, atraumatic.  ?EYES: Pupils equal, round, reactive to light.  No scleral icterus.  ?MOUTH:  Nose/mouth/throat not examined due to masking requirements for COVID 19. ?NECK: Supple. No thyromegaly. Trachea midline. No JVD.  No adenopathy. ?PULMONARY: Good air entry bilaterally.  No adventitious sounds. ?CARDIOVASCULAR: S1 and S2. Regular rate and rhythm.  No rubs, murmurs or gallops heard. ?ABDOMEN: Benign. ?MUSCULOSKELETAL: No joint deformity, no clubbing, no edema.  ?NEUROLOGIC: No focal deficit, no gait disturbance, speech is fluent. ?SKIN: Intact,warm,dry. ?PSYCH: Mood and behavior normal. ? ?   ?Assessment & Plan:  ? ?  ICD-10-CM   ?1. COPD with chronic bronchitis and emphysema ([place])  J44.9   ? She is well compensated on Trelegy ?Continue Trelegy and as needed albuterol  ?  ?2. SOB (shortness of breath)  R06.02   ? Resolved on Trelegy ?Remains active and has been encouraged to continue to do so  ?  ?3. Former smoker - remote  [phone]   ? Quit over 30 years ?Does not qualify for lung cancer screening ?Has not relapsed  ?  ? ?The patient is  doing very well.  We will see her back in 6 months time she is to contact us prior to that time should any new difficulties arise. ? ?C. Derrill Kay, MD ?Advanced Bronchoscopy ?[facility] ? ? ? ?*This note was dictated using voice recognition software/Dragon.  Despite best efforts to proofread, errors can occur which can change the meaning. Any transcriptional errors that result from this process are unintentional and may not be fully corrected at the time of dictation.   ?

## 2021-08-21 NOTE — Patient Instructions (Signed)
You are doing well. ? ?Continue using Trelegy. ? ?We will see him in follow-up in 6 months time call sooner should any new problems arise. ?

## 2021-08-26 DIAGNOSIS — H43811 Vitreous degeneration, right eye: Secondary | ICD-10-CM | POA: Diagnosis not present

## 2021-09-17 DIAGNOSIS — M9903 Segmental and somatic dysfunction of lumbar region: Secondary | ICD-10-CM | POA: Diagnosis not present

## 2021-09-17 DIAGNOSIS — M62830 Muscle spasm of back: Secondary | ICD-10-CM | POA: Diagnosis not present

## 2021-09-17 DIAGNOSIS — M9905 Segmental and somatic dysfunction of pelvic region: Secondary | ICD-10-CM | POA: Diagnosis not present

## 2021-09-17 DIAGNOSIS — M5136 Other intervertebral disc degeneration, lumbar region: Secondary | ICD-10-CM | POA: Diagnosis not present

## 2021-09-19 ENCOUNTER — Other Ambulatory Visit: Payer: Self-pay | Admitting: Internal Medicine

## 2021-09-19 DIAGNOSIS — Z1231 Encounter for screening mammogram for malignant neoplasm of breast: Secondary | ICD-10-CM

## 2021-10-13 ENCOUNTER — Other Ambulatory Visit: Payer: Self-pay | Admitting: Internal Medicine

## 2021-10-15 DIAGNOSIS — M9903 Segmental and somatic dysfunction of lumbar region: Secondary | ICD-10-CM | POA: Diagnosis not present

## 2021-10-15 DIAGNOSIS — M9905 Segmental and somatic dysfunction of pelvic region: Secondary | ICD-10-CM | POA: Diagnosis not present

## 2021-10-15 DIAGNOSIS — M5136 Other intervertebral disc degeneration, lumbar region: Secondary | ICD-10-CM | POA: Diagnosis not present

## 2021-10-15 DIAGNOSIS — M62830 Muscle spasm of back: Secondary | ICD-10-CM | POA: Diagnosis not present

## 2021-10-25 ENCOUNTER — Ambulatory Visit
Admission: RE | Admit: 2021-10-25 | Discharge: 2021-10-25 | Disposition: A | Discharge status: Home / Self Care | Payer: Medicare PPO | Source: Ambulatory Visit | Attending: Internal Medicine | Admitting: Internal Medicine

## 2021-10-25 DIAGNOSIS — Z1231 Encounter for screening mammogram for malignant neoplasm of breast: Secondary | ICD-10-CM | POA: Insufficient documentation

## 2021-10-29 ENCOUNTER — Other Ambulatory Visit (HOSPITAL_COMMUNITY): Payer: Self-pay | Admitting: Gastroenterology

## 2021-10-29 ENCOUNTER — Other Ambulatory Visit: Payer: Self-pay | Admitting: Gastroenterology

## 2021-10-29 DIAGNOSIS — R7401 Elevation of levels of liver transaminase levels: Secondary | ICD-10-CM | POA: Diagnosis not present

## 2021-10-29 DIAGNOSIS — R1032 Left lower quadrant pain: Secondary | ICD-10-CM

## 2021-10-29 DIAGNOSIS — Z1159 Encounter for screening for other viral diseases: Secondary | ICD-10-CM | POA: Diagnosis not present

## 2021-10-30 ENCOUNTER — Other Ambulatory Visit: Payer: Self-pay | Admitting: Internal Medicine

## 2021-10-30 ENCOUNTER — Ambulatory Visit (INDEPENDENT_AMBULATORY_CARE_PROVIDER_SITE_OTHER): Payer: Medicare PPO

## 2021-10-30 VITALS — Ht 63.0 in | Wt 143.0 lb

## 2021-10-30 DIAGNOSIS — Z0000 Encounter for general adult medical examination without abnormal findings: Secondary | ICD-10-CM

## 2021-10-30 NOTE — Patient Instructions (Addendum)
?  Savannah Cox , ?Thank you for taking time to come for your Medicare Wellness Visit. I appreciate your ongoing commitment to your health goals. Please review the following plan we discussed and let me know if I can assist you in the future.  ? ?These are the goals we discussed: ? Goals   ? ?  Increase physical activity   ?  Stay active; monitoring steps with goal of 6,000 steps daily ?Stay hydrated ?Maintain weight ?  ? ?  ?  ?This is a list of the screening recommended for you and due dates:  ?Health Maintenance  ?Topic Date Due  ? COVID-19 Vaccine (4 - Booster for Pfizer series) 11/15/2021*  ? Colon Cancer Screening  02/14/2022*  ? Tetanus Vaccine  02/14/2022*  ? Flu Shot  01/14/2022  ? Mammogram  10/26/2022  ? Pneumonia Vaccine  Completed  ? DEXA scan (bone density measurement)  Completed  ? Hepatitis C Screening: USPSTF Recommendation to screen - Ages 67-79 yo.  Completed  ? Zoster (Shingles) Vaccine  Completed  ? HPV Vaccine  Aged Out  ?*Topic was postponed. The date shown is not the original due date.  ?  ?

## 2021-10-30 NOTE — Progress Notes (Signed)
Subjective:   Kiondra Frace is a 76 y.o. female who presents for Medicare Annual (Subsequent) preventive examination.  Review of Systems    No ROS.  Medicare Wellness Virtual Visit.  Visual/audio telehealth visit, UTA vital signs.   See social history for additional risk factors.   Cardiac Risk Factors include: advanced age (>44men, >50 women)     Objective:    Today's Vitals   10/30/21 1130  Weight: 143 lb (64.9 kg)  Height: 5\' 3"  (1.6 m)   Body mass index is 25.33 kg/m.     10/30/2021   11:37 AM 10/29/2020   10:37 AM 10/27/2019   10:57 AM 08/16/2018    2:38 PM 03/22/2018   10:16 AM 04/30/2017    1:57 PM 04/20/2017    9:50 AM  Advanced Directives  Does Patient Have a Medical Advance Directive? Yes No No No No No No  Type of Estate agent of [place];Living will        Does patient want to make changes to medical advance directive? No - Patient declined        Copy of Healthcare Power of Attorney in Chart? No - copy requested        Would patient like information on creating a medical advance directive?  No - Patient declined Yes (MAU/Ambulatory/Procedural Areas - Information given) No - Patient declined No - Patient declined No - Patient declined No - Patient declined    Current Medications (verified) Outpatient Encounter Medications as of 10/30/2021  Medication Sig   levothyroxine (SYNTHROID) 75 MCG tablet TAKE 1 TABLET BY MOUTH EVERY DAY   lisinopril (ZESTRIL) 10 MG tablet TAKE 1 TABLET BY MOUTH EVERY DAY   loratadine (CLARITIN) 10 MG tablet Take 10 mg by mouth daily.    Multiple Vitamins-Minerals (CENTRUM SILVER ULTRA WOMENS PO) Take 1 tablet by mouth daily.    omeprazole (PRILOSEC) 40 MG capsule TAKE 1 CAPSULE (40 MG TOTAL) BY MOUTH ONCE DAILY TAKE 30 MINS BEFORE A MEAL   rosuvastatin (CRESTOR) 5 MG tablet TAKE 2 TABLETS BY MOUTH ON MONDAY, WEDNESDAY AND FRIDAY. TAKE 1 TABLET ON ALL OTHER DAYS   sertraline (ZOLOFT) 50 MG tablet TAKE 1 TABLET BY  MOUTH EVERY DAY   TRELEGY ELLIPTA 100-62.5-25 MCG/INH AEPB TAKE 1 PUFF BY MOUTH EVERY DAY   [DISCONTINUED] triamcinolone lotion (KENALOG) 0.1 % Apply 1 application topically daily as needed. Apply to affected areas of scalp for itch   No facility-administered encounter medications on file as of 10/30/2021.    Allergies (verified) Augmentin [amoxicillin-pot clavulanate]   History: Past Medical History:  Diagnosis Date   Allergic state    Anemia    Complication of anesthesia    Diverticulosis    GERD (gastroesophageal reflux disease)    History of hiatal hernia    History of kidney stones    Hypercholesterolemia    Hypertension    Hypothyroidism    Osteoporosis, postmenopausal    PONV (postoperative nausea and vomiting)    nauseated with colonoscopy   Shingles    Past Surgical History:  Procedure Laterality Date   BREAST BIOPSY Left 90s   benign   COLONOSCOPY WITH PROPOFOL N/A 01/28/2016   Procedure: COLONOSCOPY WITH PROPOFOL;  Surgeon: Christena Deem, MD;  [facility] ENDOSCOPY;  Service: Endoscopy;  Laterality: N/A;   EXCISION METACARPAL MASS Right 04/30/2017   Procedure: EXCISION MUCOID CYST FIFTH FINGER RIGHT HAND;  Surgeon: Kennedy Bucker, MD;  Location: [facility] ORS;  Service: Orthopedics;  Laterality: Right;   EYE SURGERY Bilateral 2010   cataracts   Family History  Problem Relation Age of Onset   Heart disease Father        died age 8 - myocardial infarction   Hypercholesterolemia Mother    Arthritis Mother    Heart attack Mother    Kidney cancer Other        aunt   Breast cancer Maternal Aunt    Colon cancer Neg Hx    Social History   Socioeconomic History   Marital status: Married    Spouse name: Not on file   Number of children: 3   Years of education: 16   Highest education level: Not on file  Occupational History   Occupation: retired Magazine features editor: RETIRED  Tobacco Use   Smoking status: Former    Packs/day: 1.50    Years: 30.00     Pack years: 45.00    Types: Cigarettes    Quit date: 1990    Years since quitting: 33.3   Smokeless tobacco: Never  Vaping Use   Vaping Use: Never used  Substance and Sexual Activity   Alcohol use: Yes    Alcohol/week: 0.0 standard drinks    Comment: Rare drink, one drink per month   Drug use: No   Sexual activity: Yes  Other Topics Concern   Not on file  Social History Narrative   Not on file   Social Determinants of Health   Financial Resource Strain: Low Risk    Difficulty of Paying Living Expenses: Not hard at all  Food Insecurity: No Food Insecurity   Worried About Programme researcher, broadcasting/film/video in the Last Year: Never true   Ran Out of Food in the Last Year: Never true  Transportation Needs: No Transportation Needs   Lack of Transportation (Medical): No   Lack of Transportation (Non-Medical): No  Physical Activity: Sufficiently Active   Days of Exercise per Week: 3 days   Minutes of Exercise per Session: 60 min  Stress: No Stress Concern Present   Feeling of Stress : Not at all  Social Connections: Unknown   Frequency of Communication with Friends and Family: More than three times a week   Frequency of Social Gatherings with Friends and Family: More than three times a week   Attends Religious Services: Not on Scientist, clinical (histocompatibility and immunogenetics) or Organizations: Not on file   Attends Banker Meetings: Not on file   Marital Status: Married    Tobacco Counseling Counseling given: Not Answered   Clinical Intake:  Pre-visit preparation completed: Yes        Diabetes: No  How often do you need to have someone help you when you read instructions, pamphlets, or other written materials from your doctor or pharmacy?: 1 - Never  Interpreter Needed?: No    Activities of Daily Living    10/30/2021   11:32 AM  In your present state of health, do you have any difficulty performing the following activities:  Hearing? 0  Vision? 0  Difficulty concentrating or  making decisions? 0  Walking or climbing stairs? 0  Dressing or bathing? 0  Doing errands, shopping? 0  Preparing Food and eating ? N  Using the Toilet? N  In the past six months, have you accidently leaked urine? N  Do you have problems with loss of bowel control? N  Managing your Medications? N  Managing your Finances? N  Housekeeping or  managing your Housekeeping? N    Patient Care Team: Dale Leflore, MD as PCP - General (Internal Medicine)  Indicate any recent Medical Services you may have received from other than [facility] providers in the past year (date may be approximate).     Assessment:   This is a routine wellness examination for [place].  Virtual Visit via Telephone Note  I connected with  Veyda Govern on 10/30/21 at 11:30 AM EDT by telephone and verified that I am speaking with the correct person using two identifiers.  Persons participating in the virtual visit: patient/Nurse Health Advisor   I discussed the limitations of performing an evaluation and management service by telehealth. The patient expressed understanding and agreed to proceed. We continued and completed visit with audio only. Some vital signs may be absent or patient reported.   Hearing/Vision screen Hearing Screening - Comments:: Patient is able to hear conversational tones without difficulty. No issues reported. Vision Screening - Comments:: Followed by [facility]  Wears corrective lenses when reading  Cataract extraction, bilateral  They have regular follow up with the ophthalmologist  Dietary issues and exercise activities discussed: Current Exercise Habits: Home exercise routine, Type of exercise: walking (Chair yoga. Silver sneakers.), Time (Minutes): > 60, Frequency (Times/Week): 2, Weekly Exercise (Minutes/Week): 0, Intensity: Moderate   Goals Addressed             This Visit's Progress    Increase physical activity       Stay active; monitoring steps with goal of 6,000  steps daily Stay hydrated Maintain weight       Depression Screen    10/30/2021   11:35 AM 04/02/2021    8:25 AM 02/14/2021    1:40 PM 10/29/2020   10:36 AM 07/18/2020    8:07 AM 10/27/2019   10:58 AM 06/06/2019   10:11 AM  PHQ 2/9 Scores  PHQ - 2 Score 0 0 0 0 0 0 0    Fall Risk    10/30/2021   11:38 AM 04/02/2021    8:24 AM 02/14/2021    1:40 PM 10/29/2020   10:43 AM 07/18/2020    8:07 AM  Fall Risk   Falls in the past year? 0 0 0 0 0  Number falls in past yr: 0 0 0 0 0  Injury with Fall?  0 0 0   Risk for fall due to :  Other (Comment)     Follow up Falls evaluation completed Falls evaluation completed Falls evaluation completed Falls evaluation completed Falls evaluation completed    FALL RISK PREVENTION PERTAINING TO THE HOME: Home free of loose throw rugs in walkways, pet beds, electrical cords, etc? Yes  Adequate lighting in your home to reduce risk of falls? Yes   ASSISTIVE DEVICES UTILIZED TO PREVENT FALLS: Use of a cane, walker or w/c? No   TIMED UP AND GO: Was the test performed? No .   Cognitive Function: Patient is alert and oriented x3.     10/27/2019   11:00 AM 03/22/2018   10:22 AM 03/19/2017    8:49 AM 03/17/2016    9:55 AM  MMSE - Mini Mental State Exam  Not completed: Unable to complete     Orientation to time  5 5 5   Orientation to Place  5 5 5   Registration  3 3 3   Attention/ Calculation  5 5 5   Recall  3 3 3   Language- name 2 objects  2 2 2   Language- repeat  1 1 1   Language- follow 3 step command  3 3 3   Language- read & follow direction  1 1 1   Write a sentence  1 1 1   Copy design  1 1 1   Total score  30 30 30        Immunizations Immunization History  Administered Date(s) Administered   DTaP 12/09/2010   Fluad Quad(high Dose 65+) 02/14/2021   Influenza Split 04/29/2012   Influenza, High Dose Seasonal PF 02/19/2016, 03/19/2017, 06/03/2018, 02/28/2020   Influenza,inj,Quad PF,6+ Mos 02/21/2013, 02/13/2014, 05/14/2015    Influenza-Unspecified 02/16/2019   PFIZER(Purple Top)SARS-COV-2 Vaccination 07/31/2019, 08/23/2019, 04/05/2020   Pneumococcal Conjugate-13 04/28/2013   Pneumococcal Polysaccharide-23 02/13/2014   Zoster Recombinat (Shingrix) 02/01/2019, 05/16/2019   Zoster, Live 10/12/2011   Screening Tests Health Maintenance  Topic Date Due   COVID-19 Vaccine (4 - Booster for Pfizer series) 11/15/2021 (Originally 05/31/2020)   COLONOSCOPY (Pts 45-45yrs Insurance coverage will need to be confirmed)  02/14/2022 (Originally 01/27/2021)   TETANUS/TDAP  02/14/2022 (Originally 12/08/2020)   INFLUENZA VACCINE  01/14/2022   MAMMOGRAM  10/26/2022   Pneumonia Vaccine 21+ Years old  Completed   DEXA SCAN  Completed   Hepatitis C Screening  Completed   Zoster Vaccines- Shingrix  Completed   HPV VACCINES  Aged Out   Health Maintenance There are no preventive care reminders to display for this patient.  Vision Screening: Recommended annual ophthalmology exams for early detection of glaucoma and other disorders of the eye.  Dental Screening: Recommended annual dental exams for proper oral hygiene  Community Resource Referral / Chronic Care Management: CRR required this visit?  No   CCM required this visit?  No      Plan:   Keep all routine maintenance appointments.   I have personally reviewed and noted the following in the patient's chart:   Medical and social history Use of alcohol, tobacco or illicit drugs  Current medications and supplements including opioid prescriptions.  Functional ability and status Nutritional status Physical activity Advanced directives List of other physicians Hospitalizations, surgeries, and ER visits in previous 12 months Vitals Screenings to include cognitive, depression, and falls Referrals and appointments  In addition, I have reviewed and discussed with patient certain preventive protocols, quality metrics, and best practice recommendations. A written  personalized care plan for preventive services as well as general preventive health recommendations were provided to patient.     Ashok Pall, LPN   1/61/0960

## 2021-11-05 ENCOUNTER — Ambulatory Visit: Payer: Medicare PPO | Admitting: Internal Medicine

## 2021-11-05 ENCOUNTER — Encounter: Payer: Self-pay | Admitting: Internal Medicine

## 2021-11-05 ENCOUNTER — Ambulatory Visit
Admission: RE | Admit: 2021-11-05 | Discharge: 2021-11-05 | Disposition: A | Discharge status: Home / Self Care | Payer: Medicare PPO | Source: Ambulatory Visit | Attending: Gastroenterology | Admitting: Gastroenterology

## 2021-11-05 VITALS — BP 126/72 | HR 77 | Temp 98.0°F | Resp 14 | Ht 63.0 in | Wt 142.0 lb

## 2021-11-05 DIAGNOSIS — R1032 Left lower quadrant pain: Secondary | ICD-10-CM | POA: Diagnosis not present

## 2021-11-05 DIAGNOSIS — I700 Atherosclerosis of aorta: Secondary | ICD-10-CM | POA: Diagnosis not present

## 2021-11-05 DIAGNOSIS — K219 Gastro-esophageal reflux disease without esophagitis: Secondary | ICD-10-CM

## 2021-11-05 DIAGNOSIS — R7989 Other specified abnormal findings of blood chemistry: Secondary | ICD-10-CM | POA: Diagnosis not present

## 2021-11-05 DIAGNOSIS — R109 Unspecified abdominal pain: Secondary | ICD-10-CM

## 2021-11-05 DIAGNOSIS — R7401 Elevation of levels of liver transaminase levels: Secondary | ICD-10-CM | POA: Diagnosis not present

## 2021-11-05 DIAGNOSIS — I10 Essential (primary) hypertension: Secondary | ICD-10-CM | POA: Diagnosis not present

## 2021-11-05 DIAGNOSIS — E039 Hypothyroidism, unspecified: Secondary | ICD-10-CM | POA: Diagnosis not present

## 2021-11-05 DIAGNOSIS — E7800 Pure hypercholesterolemia, unspecified: Secondary | ICD-10-CM | POA: Diagnosis not present

## 2021-11-05 DIAGNOSIS — J432 Centrilobular emphysema: Secondary | ICD-10-CM | POA: Diagnosis not present

## 2021-11-05 DIAGNOSIS — K5730 Diverticulosis of large intestine without perforation or abscess without bleeding: Secondary | ICD-10-CM | POA: Diagnosis not present

## 2021-11-05 DIAGNOSIS — Z0000 Encounter for general adult medical examination without abnormal findings: Secondary | ICD-10-CM | POA: Diagnosis not present

## 2021-11-05 DIAGNOSIS — N281 Cyst of kidney, acquired: Secondary | ICD-10-CM | POA: Diagnosis not present

## 2021-11-05 DIAGNOSIS — M810 Age-related osteoporosis without current pathological fracture: Secondary | ICD-10-CM | POA: Diagnosis not present

## 2021-11-05 DIAGNOSIS — D649 Anemia, unspecified: Secondary | ICD-10-CM

## 2021-11-05 MED ORDER — IOHEXOL 300 MG/ML  SOLN
85.0000 mL | Freq: Once | INTRAMUSCULAR | Status: AC | PRN
  Administered 2021-11-05: 85 mL via INTRAVENOUS

## 2021-11-05 NOTE — Assessment & Plan Note (Signed)
Physical today 11/05/21. PAP 04/2017 - negative with negative HPV.  Mammogram 10/25/21 - Birads I.  colonoscopy 2021.

## 2021-11-05 NOTE — Progress Notes (Signed)
Patient ID: Elmina Hendel, female   DOB: 1946/04/19, 76 y.o.   MRN: [id number]   Subjective:    Patient ID: Verena Shawgo, female    DOB: 1946/04/21, 76 y.o.   MRN: [id number]   Patient here for her physical exam.   Chief Complaint  Patient presents with   Follow-up    Yearly CPE   Hypertension   .   HPI Recently evaluated by GI - LLQ pain, irregular bowels,  recommended daily fiber.  CT scan planned.  Feeling better.  Eating.  Loose stools.  Discussed benefiber.  No chest pain or sob reported.  No increased cough or congestion.  No acid reflux reported.  Saw pulmonary.  Continues trelegy.  Breathing stable.    Past Medical History:  Diagnosis Date   Allergic state    Anemia    Complication of anesthesia    Diverticulosis    GERD (gastroesophageal reflux disease)    History of hiatal hernia    History of kidney stones    Hypercholesterolemia    Hypertension    Hypothyroidism    Osteoporosis, postmenopausal    PONV (postoperative nausea and vomiting)    nauseated with colonoscopy   Shingles    Past Surgical History:  Procedure Laterality Date   BREAST BIOPSY Left 90s   benign   COLONOSCOPY WITH PROPOFOL N/A 01/28/2016   Procedure: COLONOSCOPY WITH PROPOFOL;  Surgeon: Lollie Sails, MD;  Location: [facility] ENDOSCOPY;  Service: Endoscopy;  Laterality: N/A;   EXCISION METACARPAL MASS Right 04/30/2017   Procedure: EXCISION MUCOID CYST FIFTH FINGER RIGHT HAND;  Surgeon: Hessie Knows, MD;  Location: [facility] ORS;  Service: Orthopedics;  Laterality: Right;   EYE SURGERY Bilateral 2010   cataracts   Family History  Problem Relation Age of Onset   Heart disease Father        died age 31 - myocardial infarction   Hypercholesterolemia Mother    Arthritis Mother    Heart attack Mother    Kidney cancer Other        aunt   Breast cancer Maternal Aunt    Colon cancer Neg Hx    Social History   Socioeconomic History   Marital status: Married    Spouse name: Not on file    Number of children: 3   Years of education: 16   Highest education level: Not on file  Occupational History   Occupation: retired Product manager: RETIRED  Tobacco Use   Smoking status: Former    Packs/day: 1.50    Years: 30.00    Pack years: 45.00    Types: Cigarettes    Quit date: 1990    Years since quitting: 33.4   Smokeless tobacco: Never  Vaping Use   Vaping Use: Never used  Substance and Sexual Activity   Alcohol use: Yes    Alcohol/week: 0.0 standard drinks    Comment: Rare drink, one drink per month   Drug use: No   Sexual activity: Yes  Other Topics Concern   Not on file  Social History Narrative   Not on file   Social Determinants of Health   Financial Resource Strain: Low Risk    Difficulty of Paying Living Expenses: Not hard at all  Food Insecurity: No Food Insecurity   Worried About Charity fundraiser in the Last Year: Never true   Tice in the Last Year: Never true  Transportation Needs: No Transportation Needs  Lack of Transportation (Medical): No   Lack of Transportation (Non-Medical): No  Physical Activity: Sufficiently Active   Days of Exercise per Week: 3 days   Minutes of Exercise per Session: 60 min  Stress: No Stress Concern Present   Feeling of Stress : Not at all  Social Connections: Unknown   Frequency of Communication with Friends and Family: More than three times a week   Frequency of Social Gatherings with Friends and Family: More than three times a week   Attends Religious Services: Not on Electrical engineer or Organizations: Not on file   Attends Archivist Meetings: Not on file   Marital Status: Married     Review of Systems  Constitutional:  Negative for appetite change and unexpected weight change.  HENT:  Negative for congestion, sinus pressure and sore throat.   Eyes:  Negative for pain and visual disturbance.  Respiratory:  Negative for cough, chest tightness and shortness of breath.    Cardiovascular:  Negative for chest pain, palpitations and leg swelling.  Gastrointestinal:  Negative for diarrhea, nausea and vomiting.       Reported left lower quadrant pain.  Genitourinary:  Negative for difficulty urinating and dysuria.  Musculoskeletal:  Negative for back pain and joint swelling.  Skin:  Negative for color change and rash.  Neurological:  Negative for dizziness, light-headedness and headaches.  Hematological:  Negative for adenopathy. Does not bruise/bleed easily.  Psychiatric/Behavioral:  Negative for decreased concentration and dysphoric mood.       Objective:     BP 126/72 (BP Location: Left Arm, Patient Position: Sitting, Cuff Size: Small)   Pulse 77   Temp 98 F (36.7 C) (Temporal)   Resp 14   Ht '5\' 3"'$  (1.6 m)   Wt 142 lb (64.4 kg)   SpO2 95%   BMI 25.15 kg/m  Wt Readings from Last 3 Encounters:  11/05/21 142 lb (64.4 kg)  10/30/21 143 lb (64.9 kg)  08/21/21 143 lb 12.8 oz (65.2 kg)    Physical Exam Vitals reviewed.  Constitutional:      General: She is not in acute distress.    Appearance: Normal appearance. She is well-developed.  HENT:     Head: Normocephalic and atraumatic.     Right Ear: External ear normal.     Left Ear: External ear normal.  Eyes:     General: No scleral icterus.       Right eye: No discharge.        Left eye: No discharge.     Conjunctiva/sclera: Conjunctivae normal.  Neck:     Thyroid: No thyromegaly.  Cardiovascular:     Rate and Rhythm: Normal rate and regular rhythm.  Pulmonary:     Effort: No tachypnea, accessory muscle usage or respiratory distress.     Breath sounds: Normal breath sounds. No decreased breath sounds or wheezing.  Chest:  Breasts:    Right: No inverted nipple, mass, nipple discharge or tenderness (no axillary adenopathy).     Left: No inverted nipple, mass, nipple discharge or tenderness (no axilarry adenopathy).  Abdominal:     General: Bowel sounds are normal.     Palpations:  Abdomen is soft.     Tenderness: There is no abdominal tenderness.  Musculoskeletal:        General: No swelling or tenderness.     Cervical back: Neck supple.  Lymphadenopathy:     Cervical: No cervical adenopathy.  Skin:  Findings: No erythema or rash.  Neurological:     Mental Status: She is alert and oriented to person, place, and time.  Psychiatric:        Mood and Affect: Mood normal.        Behavior: Behavior normal.     Outpatient Encounter Medications as of 11/05/2021  Medication Sig   levothyroxine (SYNTHROID) 75 MCG tablet TAKE 1 TABLET BY MOUTH EVERY DAY   lisinopril (ZESTRIL) 10 MG tablet TAKE 1 TABLET BY MOUTH EVERY DAY   loratadine (CLARITIN) 10 MG tablet Take 10 mg by mouth daily.    Multiple Vitamins-Minerals (CENTRUM SILVER ULTRA WOMENS PO) Take 1 tablet by mouth daily.    omeprazole (PRILOSEC) 40 MG capsule TAKE 1 CAPSULE (40 MG TOTAL) BY MOUTH ONCE DAILY TAKE 30 MINS BEFORE A MEAL   rosuvastatin (CRESTOR) 5 MG tablet TAKE 2 TABLETS BY MOUTH ON MONDAY, WEDNESDAY AND FRIDAY. TAKE 1 TABLET ON ALL OTHER DAYS   sertraline (ZOLOFT) 50 MG tablet TAKE 1 TABLET BY MOUTH EVERY DAY   TRELEGY ELLIPTA 100-62.5-25 MCG/INH AEPB TAKE 1 PUFF BY MOUTH EVERY DAY   No facility-administered encounter medications on file as of 11/05/2021.     Lab Results  Component Value Date   WBC 4.2 07/26/2021   HGB 13.0 07/26/2021   HCT 40.0 07/26/2021   PLT 206.0 07/26/2021   GLUCOSE 93 07/26/2021   CHOL 191 07/26/2021   TRIG 56.0 07/26/2021   HDL 85.40 07/26/2021   LDLDIRECT 165.9 05/11/2013   LDLCALC 94 07/26/2021   ALT 25 07/26/2021   AST 41 (H) 07/26/2021   NA 142 07/26/2021   K 4.1 07/26/2021   CL 107 07/26/2021   CREATININE 0.75 07/26/2021   BUN 13 07/26/2021   CO2 29 07/26/2021   TSH 1.02 07/26/2021   INR 0.96 04/14/2016    MM 3D SCREEN BREAST BILATERAL  Result Date: 10/25/2021 CLINICAL DATA:  Screening. EXAM: DIGITAL SCREENING BILATERAL MAMMOGRAM WITH TOMOSYNTHESIS  AND CAD TECHNIQUE: Bilateral screening digital craniocaudal and mediolateral oblique mammograms were obtained. Bilateral screening digital breast tomosynthesis was performed. The images were evaluated with computer-aided detection. COMPARISON:  Previous exam(s). ACR Breast Density Category c: The breast tissue is heterogeneously dense, which may obscure small masses. FINDINGS: There are no findings suspicious for malignancy. IMPRESSION: No mammographic evidence of malignancy. A result letter of this screening mammogram will be mailed directly to the patient. RECOMMENDATION: Screening mammogram in one year. (Code:SM-B-01Y) BI-RADS CATEGORY  1: Negative. Electronically Signed   By: Claudie Revering M.D.   On: 10/25/2021 13:14       Assessment & Plan:   Problem List Items Addressed This Visit     Abdominal pain    LLQ pain as outlined.  Saw GI.  Planning CT.  Better.  Follow.        Abnormal liver function test   Relevant Orders   Hepatic function panel   Anemia    Follow cbc.        Aortic atherosclerosis (HCC)    Continue crestor.  Low cholesterol diet and exercise.  Follow lipid panel and liver function tests.   Lab Results  Component Value Date   CHOL 191 07/26/2021   HDL 85.40 07/26/2021   LDLCALC 94 07/26/2021   LDLDIRECT 165.9 05/11/2013   TRIG 56.0 07/26/2021   CHOLHDL 2 07/26/2021       Centrilobular emphysema (HCC)    Evaluated by pulmonary as outlined.   Continue trelegy.  GERD (gastroesophageal reflux disease)    No acid reflux reported.  Continues on prilosec.        Health care maintenance    Physical today 11/05/21. PAP 04/2017 - negative with negative HPV.  Mammogram 10/25/21 - Birads I.  colonoscopy 2021.        Hypercholesterolemia    Continue crestor.  Low cholesterol diet and exercise.  Follow lipid panel and liver function tests.   Lab Results  Component Value Date   CHOL 191 07/26/2021   HDL 85.40 07/26/2021   LDLCALC 94 07/26/2021    LDLDIRECT 165.9 05/11/2013   TRIG 56.0 07/26/2021   CHOLHDL 2 07/26/2021       Relevant Orders   Lipid Profile   Hypertension    Continue lisinopril.  Blood pressure doing well.  Follow pressure.  Follow metabolic panel.        Relevant Orders   Basic Metabolic Panel (BMET)   Hypothyroidism    On thyroid replacement.  Follow tsh.         Osteoporosis    Reclast.  Followed by endocrinology.        Other Visit Diagnoses     Routine general medical examination at a health care facility    -  Primary        Einar Pheasant, MD

## 2021-11-10 ENCOUNTER — Encounter: Payer: Self-pay | Admitting: Internal Medicine

## 2021-11-10 NOTE — Assessment & Plan Note (Signed)
Reclast.  Followed by endocrinology.  

## 2021-11-10 NOTE — Assessment & Plan Note (Signed)
Continue crestor.  Low cholesterol diet and exercise.  Follow lipid panel and liver function tests.   Lab Results  Component Value Date   CHOL 191 07/26/2021   HDL 85.40 07/26/2021   LDLCALC 94 07/26/2021   LDLDIRECT 165.9 05/11/2013   TRIG 56.0 07/26/2021   CHOLHDL 2 07/26/2021

## 2021-11-10 NOTE — Assessment & Plan Note (Signed)
Continue lisinopril.  Blood pressure doing well.  Follow pressure.  Follow metabolic panel.  

## 2021-11-10 NOTE — Assessment & Plan Note (Signed)
LLQ pain as outlined.  Saw GI.  Planning CT.  Better.  Follow.

## 2021-11-10 NOTE — Assessment & Plan Note (Signed)
No acid reflux reported.  Continues on prilosec.  

## 2021-11-10 NOTE — Assessment & Plan Note (Signed)
Follow cbc.  

## 2021-11-10 NOTE — Assessment & Plan Note (Signed)
Evaluated by pulmonary as outlined.   Continue trelegy.   

## 2021-11-10 NOTE — Assessment & Plan Note (Signed)
On thyroid replacement.  Follow tsh.  

## 2021-11-19 DIAGNOSIS — M9905 Segmental and somatic dysfunction of pelvic region: Secondary | ICD-10-CM | POA: Diagnosis not present

## 2021-11-19 DIAGNOSIS — M62830 Muscle spasm of back: Secondary | ICD-10-CM | POA: Diagnosis not present

## 2021-11-19 DIAGNOSIS — M9903 Segmental and somatic dysfunction of lumbar region: Secondary | ICD-10-CM | POA: Diagnosis not present

## 2021-11-19 DIAGNOSIS — M5136 Other intervertebral disc degeneration, lumbar region: Secondary | ICD-10-CM | POA: Diagnosis not present

## 2021-12-16 DIAGNOSIS — M5136 Other intervertebral disc degeneration, lumbar region: Secondary | ICD-10-CM | POA: Diagnosis not present

## 2021-12-16 DIAGNOSIS — M62830 Muscle spasm of back: Secondary | ICD-10-CM | POA: Diagnosis not present

## 2021-12-16 DIAGNOSIS — M9905 Segmental and somatic dysfunction of pelvic region: Secondary | ICD-10-CM | POA: Diagnosis not present

## 2021-12-16 DIAGNOSIS — M9903 Segmental and somatic dysfunction of lumbar region: Secondary | ICD-10-CM | POA: Diagnosis not present

## 2021-12-30 DIAGNOSIS — R9431 Abnormal electrocardiogram [ECG] [EKG]: Secondary | ICD-10-CM | POA: Diagnosis not present

## 2021-12-30 DIAGNOSIS — R0602 Shortness of breath: Secondary | ICD-10-CM | POA: Diagnosis not present

## 2021-12-30 DIAGNOSIS — R0789 Other chest pain: Secondary | ICD-10-CM | POA: Diagnosis not present

## 2021-12-30 DIAGNOSIS — I700 Atherosclerosis of aorta: Secondary | ICD-10-CM | POA: Diagnosis not present

## 2021-12-31 DIAGNOSIS — M6289 Other specified disorders of muscle: Secondary | ICD-10-CM | POA: Diagnosis not present

## 2022-01-06 ENCOUNTER — Ambulatory Visit: Payer: Medicare PPO | Attending: Gastroenterology

## 2022-01-06 DIAGNOSIS — R278 Other lack of coordination: Secondary | ICD-10-CM | POA: Diagnosis not present

## 2022-01-06 DIAGNOSIS — M6289 Other specified disorders of muscle: Secondary | ICD-10-CM | POA: Insufficient documentation

## 2022-01-06 DIAGNOSIS — M6281 Muscle weakness (generalized): Secondary | ICD-10-CM | POA: Insufficient documentation

## 2022-01-06 NOTE — Therapy (Signed)
OUTPATIENT PHYSICAL THERAPY FEMALE PELVIC EVALUATION   Patient Name: Savannah Cox MRN: [id number] DOB:1946-03-26, 76 y.o., female Today's Date: 01/06/2022   PT End of Session - 01/06/22 1014     Visit Number 1    Number of Visits 12    Date for PT Re-Evaluation 03/31/22    Authorization Type IE: 01/06/22    PT Start Time 1015    PT Stop Time 1055    PT Time Calculation (min) 40 min    Activity Tolerance Patient tolerated treatment well             Past Medical History:  Diagnosis Date   Allergic state    Anemia    Complication of anesthesia    Diverticulosis    GERD (gastroesophageal reflux disease)    History of hiatal hernia    History of kidney stones    Hypercholesterolemia    Hypertension    Hypothyroidism    Osteoporosis, postmenopausal    PONV (postoperative nausea and vomiting)    nauseated with colonoscopy   Shingles    Past Surgical History:  Procedure Laterality Date   BREAST BIOPSY Left 90s   benign   COLONOSCOPY WITH PROPOFOL N/A 01/28/2016   Procedure: COLONOSCOPY WITH PROPOFOL;  Surgeon: Lollie Sails, MD;  Location: [facility] ENDOSCOPY;  Service: Endoscopy;  Laterality: N/A;   EXCISION METACARPAL MASS Right 04/30/2017   Procedure: EXCISION MUCOID CYST FIFTH FINGER RIGHT HAND;  Surgeon: Hessie Knows, MD;  Location: [facility] ORS;  Service: Orthopedics;  Laterality: Right;   EYE SURGERY Bilateral 2010   cataracts   Patient Active Problem List   Diagnosis Date Noted   Leg cramps 07/14/2021   Kidney cysts 03/20/2021   Chest pain 02/14/2021   Monocytosis 02/14/2021   Abnormal liver function test 02/14/2021   Back pain 10/11/2020   Centrilobular emphysema ([place]) 02/13/2020   Former smoker 02/13/2020   Sleeping difficulties 01/22/2020   Leukopenia 09/12/2019   SOB (shortness of breath) 06/06/2019   Chest tightness 03/20/2019   Osteoporosis 07/12/2018   Bilateral hip pain 06/12/2018   Aortic atherosclerosis ([place]) 10/10/2017   Finger pain, right  07/05/2017   Acute colitis 04/17/2016   Diarrhea 04/17/2016   Hypokalemia 04/17/2016   Headache 04/17/2016   GIB (gastrointestinal bleeding) 04/14/2016   History of colonic polyps 01/31/2016   Change in bowel movement 10/21/2015   Pain of both thumbs 01/14/2015   Right hip pain 01/10/2015   Health care maintenance 09/04/2014   Abnormal mammogram 09/25/2013   Abdominal pain 02/22/2013   Anemia 05/01/2012   GERD (gastroesophageal reflux disease) 05/01/2012   Hematuria 05/01/2012   Hypothyroidism 05/01/2012   Hypercholesterolemia 05/01/2012   Hypertension 05/01/2012    PCP: Einar Pheasant, MD  REFERRING PROVIDER:  [phone] (ICD-10-CM) - Pelvic floor dysfunction   REFERRING DIAG: Ok Edwards, NP   THERAPY DIAG:  Pelvic floor dysfunction  Other lack of coordination  Muscle weakness (generalized)  Rationale for Evaluation and Treatment: Rehabilitation  ONSET DATE: 5 years ago   RED FLAGS: N/A  Have you had any night sweats? Unexplained weight loss? Saddle anesthesia? Unexplained changes in bowel or bladder habits?   SUBJECTIVE: Patient confirms identification and approves PT to assess pelvic floor and treatment Yes  PRECAUTIONS: None  WEIGHT BEARING RESTRICTIONS: No  FALLS:  Has patient fallen in last 6 months? No  OCCUPATION/SOCIAL ACTIVITIES: Retired, gardening, reading, walking, Silver Sneakers once/week  PLOF: Independent    CHIEF CONCERN: Pt has had a loss of bladder control and she feels it is getting worse because she is having bowel leakage as well. Bowel leakage began about a year ago. Urinary leakage began 5 years ago and she began wearing a thin pad. Urinary leakage has become worse. Physical activity, squatting, sneezing, coughing, laughing all causes  urinary leakage. When Pt gardens, she feels her pad is soiled after. Pt at times feels she cannot sense her bowel movements coming out when she sits on the toilet to urinate. Pt does not have pain with bowel movements or straining but sometimes will push so more comes out in order to not have bowel leakage. Bowel leakage is noticed more with walking/exercising or after a Silver Sneaker class. Pt has had instances of both urinary and bowel leakage when performing physical activity.    PAIN:  Are you having pain? Yes NPRS scale: 0/10 (current), 3/10 (worst) Pain location: lower back   Pain type: aching Pain description: intermittent   Aggravating factors: in the morning Relieving factors: chiropractor once a month, heat    LIVING ENVIRONMENT: Lives with: lives with their spouse Lives in: House/apartment   PATIENT GOALS: Not to have bowel leakage and not have as much urine leakage, Pt has COPD and wants to not leak as much with coughing     UROLOGICAL HISTORY Fluid intake: 2 cups of coffee in morning, water during day, 1 soft drink a day, milk   Pain with urination: No Fully empty bladder: No  Stream: constant  Urgency: No Frequency: 7-8x/day Nocturia: 1x  Leakage: Walking to the bathroom, Coughing, Sneezing, Laughing, Exercise, Lifting, and Bending forward Pads: Yes Type: Poise, Lvl 4 Amount: 3x/day Bladder control (0-10): 4/10  GASTROINTESTINAL HISTORY Pain with bowel movement: No Type of bowel movement:Type (Bristol Stool Scale) Type 1 and Type 4, Frequency 1x/day, and Strain No Frequency: 1x/day Fully empty rectum: No Leakage: Yes, mucus like but can be formed too, 5x/week Pads: Yes - wears a long one for urinary incontinence  Stool softner- every now and then    SEXUAL HISTORY/FUNCTION Pt has no concerns    OBSTETRICAL HISTORY Vaginal deliveries: G3P3 Tearing: Yes - episiotomy with first child    GYNECOLOGICAL HISTORY Hysterectomy: no Pelvic Organ Prolapse:  none Heaviness/pressure: no    OBJECTIVE:    COGNITION: Overall cognitive status: Within functional limits for tasks assessed     POSTURE:  Crossed legs in sitting and adducted hips (increase PFM tension)  Lumbar lordosis:  Deferred 2/2 time constraints  Thoracic kyphosis: Iliac crest height:  Lumbar lateral shift:  Pelvic obliquity:  Leg length discrepancy:   GAIT: Deferred 2/2 time constraints  Distance walked:  Comments:   Trendelenburg:   SENSATION: Deferred 2/2 time constraints  Light touch: , L2-S2 dermatomes  Proprioception:    RANGE OF MOTION:  Deferred 2/2 time constraints   (Norm range in degrees)  LEFT  RIGHT   Lumbar forward flexion (65):      Lumbar extension (30):     Lumbar lateral flexion (25):     Thoracic and Lumbar rotation (30 degrees):       Hip Flexion (0-125):      Hip IR (0-45):     Hip ER (0-45):     Hip Adduction:  Hip Abduction (0-40):     Hip extension (0-15):     (*= pain, Blank rows = not tested)   STRENGTH: MMT Deferred 2/2 time constraints   RLE  LLE   Hip Flexion    Hip Extension    Hip Abduction     Hip Adduction     Hip ER     Hip IR     Knee Extension    Knee Flexion    Dorsiflexion     Plantarflexion (seated)    (*= pain, Blank rows = not tested)   SPECIAL TESTS: Deferred 2/2 time constraints  Centralization and Peripheralization (SN 92, -LR 0.12):  Slump (SN 83, -LR 0.32):  SLR (SN 92, -LR 0.29): R: Lumbar quadrant (SN 70): R:  FABER (SN 81): FADIR (SN 94):  Hip scour (SN 50):  Thigh Thrust (SN 88, -LR 0.18) : Distraction (EV03):  Compression (SN/SP 69): Stork/March (SP 93):   PHYSICAL PERFORMANCE MEASURES: Deferred 2/2 time constraints   STS:  RLE SLS:  LLE SLS:  6 MWT:  10MWT:  5TSTS:   PALPATION: Deferred 2/2 time constraints  Abdominal:  Diastasis:  finger above umbilicus,  fingers at and below umbilicus  Scar mobility: present/mobile perpendicular, parallel Rib flare:  present/absent  EXTERNAL PELVIC EXAM: Patient educated on the purpose of the pelvic exam and articulated understanding; patient consented to the exam verbally. Deferred 2/2 time constraints  Palpation: Breath coordination: present/absent/inconsistent Voluntary Contraction: present/absent Relaxation: full/delayed/non-relaxing Perineal movement with sustained IAP increase ("bear down"): descent/no change/elevation/excessive descent Perineal movement with rapid IAP increase ("cough"): elevation/no change/descent Pubic symphysis: (0= no contraction, 1= flicker, 2= weak squeeze, 3= fair squeeze with lift, 4= good squeeze and lift against resistance, 5= strong squeeze against strong resistance)   INTERNAL PELVIC EXAM: Patient educated on the purpose of the pelvic exam and articulated understanding; patient consented to the exam verbally. Not applicable to Pt at this time Introitus Appears:  Skin integrity:  Scar mobility: Strength (PERF):  Symmetry: Palpation: Prolapse: (0= no contraction, 1= flicker, 2= weak squeeze, 3= fair squeeze with lift, 4= good squeeze and lift against resistance, 5= strong squeeze against strong resistance)    Patient Education:  Patient educated on what to expect during course of physical therapy, POC, and provided with HEP including: toileting posture handout and bowel incontinence skin protection pads handout. Patient verbalized understanding and returned demonstration. Patient will benefit from further education in order to maximize compliance and understanding for long-term therapeutic gains.   Patient Surveys:  FOTO Urinary Problem - 51     ASSESSMENT:  Clinical Impression: Patient is a 76 y.o. who was seen today for physical therapy evaluation and treatment for a chief concern of urinary and bowel leakage. Today's evaluation suggest deficits in IAP management, PFM coordination, PFM endurance, PFM extensibility, posture, pain, as evidenced by intermittent  worst LBP 4/10 (NPRS), urinary leakage with physical activity/sneezing/squatting/lifting/coughing, use of incontinence pads (Lvl 4), bowel leakage with increased physical activity, changing of incontinence pads 3x/day due to more urinary leakage than bowel, up to 5x/week of bowel leakage, and crossed legs in sitting with B adducted hips (increase PFM tension). Patient's responses on FOTO Urinary Problem (51) indicates moderate limitation/disability/distress. Patient's progress may be limited due to time since onset and COPD flare-ups; however, patient's motivation is advantageous. Pt with basic understanding of PFM function in bowel/bladder habits, the deep core, posture, and sexual function. Patient will benefit from skilled therapeutic intervention to address deficits in IAP management, PFM coordination, PFM  endurance, PFM extensibility, posture, pain in order to increase PLOF and improve overall QOL.    Objective Impairments: decreased coordination, decreased endurance, decreased strength, improper body mechanics, postural dysfunction, and pain.   Activity Limitations: lifting, bending, sitting, standing, squatting, continence, toileting, and locomotion level  Personal Factors: Age, Behavior pattern, Past/current experiences, Time since onset of injury/illness/exacerbation, and 3+ comorbidities: colitis, COPD, HTN, diverticulitis  are also affecting patient's functional outcome.   Rehab Potential: Good  Clinical Decision Making: Evolving/moderate complexity  Evaluation Complexity: Moderate   GOALS: Goals reviewed with patient? Yes  SHORT TERM GOALS: Target date: 02/17/2022  Patient will demonstrate independence with HEP in order to maximize therapeutic gains and improve carryover from physical therapy sessions to ADLs in the home and community. Baseline: toileting posture/skin protection pads handout,  Goal status: INITIAL    LONG TERM GOALS: Target date: 03/31/2022   Patient will score  >/= 60 on FOTO Urinary Problem  in order to demonstrate improved IAP management, improved PFM coordination, and overall improved QOL.  Baseline: 51 Goal status: INITIAL  2.  Patient will report confidence in ability to control bladder > 7/10 in order to demonstrate improved function and ability to participate more fully in activities at home and in the community. Baseline: 4/10 Goal status: INITIAL  3.  Patient will report being able to return to activities including, but not limited to: gardening, walking, physical fitness classes, household chores without disruption or limitation to indicate complete resolution of the chief concern and return to prior level of participation at home and in the community. Baseline: has leakage with all the above Goal status: INITIAL  4.  Patient will report decreased reliance on protective undergarments as indicated by a 24 hour period to demonstrate improved bladder control and allow for increased participation in activities outside of the home. Baseline: Lvl 4 incontinence Poise and 3x/day Goal status: INITIAL  5.  Patient will demonstrate independent and coordinated diaphragmatic breathing in supine with a 1:2 breathing pattern for improved down-regulation of the nervous system and improved management of intra-abdominal pressures in order to increase function at home and in the community. Baseline: will assess next visit  Goal status: INITIAL  6.  Patient will report less than 5 incidents of stress urinary incontinence over the course of 3 weeks while physical activity/coughing/sneezing/laughing/prolonged activity in order to demonstrate improved PFM coordination, strength, and function for improved overall QOL. Baseline: urinary leakage with all the above, bowel leakage more with physical activity  Goal status: INITIAL  PLAN: PT Frequency: 1x/week  PT Duration: 12 weeks  Planned Interventions: Therapeutic exercises, Therapeutic activity,  Neuromuscular re-education, Balance training, Gait training, Patient/Family education, Self Care, Joint mobilization, Cryotherapy, Moist heat, scar mobilization, Taping, and Manual therapy  Plan For Next Session: phys assess, bladder irritants   Savannah Cox, PT, DPT  01/06/2022, 10:59 AM

## 2022-01-10 ENCOUNTER — Other Ambulatory Visit: Payer: Self-pay | Admitting: Internal Medicine

## 2022-01-13 DIAGNOSIS — M9903 Segmental and somatic dysfunction of lumbar region: Secondary | ICD-10-CM | POA: Diagnosis not present

## 2022-01-13 DIAGNOSIS — M9905 Segmental and somatic dysfunction of pelvic region: Secondary | ICD-10-CM | POA: Diagnosis not present

## 2022-01-13 DIAGNOSIS — M5136 Other intervertebral disc degeneration, lumbar region: Secondary | ICD-10-CM | POA: Diagnosis not present

## 2022-01-13 DIAGNOSIS — M62830 Muscle spasm of back: Secondary | ICD-10-CM | POA: Diagnosis not present

## 2022-01-14 ENCOUNTER — Ambulatory Visit: Payer: Medicare PPO | Attending: Gastroenterology

## 2022-01-14 DIAGNOSIS — M6281 Muscle weakness (generalized): Secondary | ICD-10-CM | POA: Diagnosis not present

## 2022-01-14 DIAGNOSIS — M6289 Other specified disorders of muscle: Secondary | ICD-10-CM | POA: Insufficient documentation

## 2022-01-14 DIAGNOSIS — R278 Other lack of coordination: Secondary | ICD-10-CM | POA: Diagnosis not present

## 2022-01-14 NOTE — Therapy (Signed)
OUTPATIENT PHYSICAL THERAPY FEMALE PELVIC TREATMENT   Patient Name: Dejanee Thibeaux MRN: [id number] DOB:16-Jan-1946, 76 y.o., female Today's Date: 01/14/2022   PT End of Session - 01/14/22 0927     Visit Number 2    Number of Visits 12    Date for PT Re-Evaluation 03/31/22    PT Start Time 0930    PT Stop Time 1010    PT Time Calculation (min) 40 min    Activity Tolerance Patient tolerated treatment well             Past Medical History:  Diagnosis Date   Allergic state    Anemia    Complication of anesthesia    Diverticulosis    GERD (gastroesophageal reflux disease)    History of hiatal hernia    History of kidney stones    Hypercholesterolemia    Hypertension    Hypothyroidism    Osteoporosis, postmenopausal    PONV (postoperative nausea and vomiting)    nauseated with colonoscopy   Shingles    Past Surgical History:  Procedure Laterality Date   BREAST BIOPSY Left 90s   benign   COLONOSCOPY WITH PROPOFOL N/A 01/28/2016   Procedure: COLONOSCOPY WITH PROPOFOL;  Surgeon: Lollie Sails, MD;  Location: [facility] ENDOSCOPY;  Service: Endoscopy;  Laterality: N/A;   EXCISION METACARPAL MASS Right 04/30/2017   Procedure: EXCISION MUCOID CYST FIFTH FINGER RIGHT HAND;  Surgeon: Hessie Knows, MD;  Location: [facility] ORS;  Service: Orthopedics;  Laterality: Right;   EYE SURGERY Bilateral 2010   cataracts   Patient Active Problem List   Diagnosis Date Noted   Leg cramps 07/14/2021   Kidney cysts 03/20/2021   Chest pain 02/14/2021   Monocytosis 02/14/2021   Abnormal liver function test 02/14/2021   Back pain 10/11/2020   Centrilobular emphysema ([place]) 02/13/2020   Former smoker 02/13/2020   Sleeping difficulties 01/22/2020   Leukopenia 09/12/2019   SOB (shortness of breath) 06/06/2019   Chest tightness 03/20/2019   Osteoporosis 07/12/2018   Bilateral hip pain 06/12/2018   Aortic atherosclerosis ([place]) 10/10/2017   Finger pain, right 07/05/2017   Acute colitis  04/17/2016   Diarrhea 04/17/2016   Hypokalemia 04/17/2016   Headache 04/17/2016   GIB (gastrointestinal bleeding) 04/14/2016   History of colonic polyps 01/31/2016   Change in bowel movement 10/21/2015   Pain of both thumbs 01/14/2015   Right hip pain 01/10/2015   Health care maintenance 09/04/2014   Abnormal mammogram 09/25/2013   Abdominal pain 02/22/2013   Anemia 05/01/2012   GERD (gastroesophageal reflux disease) 05/01/2012   Hematuria 05/01/2012   Hypothyroidism 05/01/2012   Hypercholesterolemia 05/01/2012   Hypertension 05/01/2012    PCP: Einar Pheasant, MD  REFERRING PROVIDER:  [phone] (ICD-10-CM) - Pelvic floor dysfunction   REFERRING DIAG: Ok Edwards, NP   THERAPY DIAG:  Pelvic floor dysfunction  Other lack of coordination  Muscle weakness (generalized)  Rationale for Evaluation and Treatment: Rehabilitation  ONSET DATE: 5 years ago  PRECAUTIONS: None  WEIGHT BEARING RESTRICTIONS: No  FALLS:  Has patient fallen in last 6 months? No  OCCUPATION/SOCIAL ACTIVITIES: Retired, gardening, reading, walking, Silver Sneakers once/week   CHIEF CONCERN: Pt has had a loss of bladder control and she feels it is getting worse because she is having bowel leakage as well. Bowel leakage began about a year ago. Urinary leakage began 5 years ago and she began wearing a thin pad. Urinary leakage has become worse. Physical activity, squatting, sneezing, coughing, laughing all causes urinary leakage. When Pt gardens, she feels her pad is soiled after. Pt at times feels she cannot sense her bowel movements coming out when she sits on the toilet to urinate. Pt does not have pain with bowel movements or straining but sometimes will push so more comes out in order to not have bowel  leakage. Bowel leakage is noticed more with walking/exercising or after a Silver Sneaker class. Pt has had instances of both urinary and bowel leakage when performing physical activity.   NPRS scale: 0/10 (current), 3/10 (worst) Pain location: lower back   Pain type: aching Pain description: intermittent   Aggravating factors: in the morning Relieving factors: chiropractor once a month, heat   LIVING ENVIRONMENT: Lives with: lives with their spouse Lives in: House/apartment   PATIENT GOALS: Not to have bowel leakage and not have as much urine leakage, Pt has COPD and wants to not leak as much with coughing     UROLOGICAL HISTORY Fluid intake: 2 cups of coffee in morning, water during day, 1 soft drink a day, milk   Pain with urination: No Fully empty bladder: No  Stream: constant  Urgency: No Frequency: 7-8x/day Nocturia: 1x  Leakage: Walking to the bathroom, Coughing, Sneezing, Laughing, Exercise, Lifting, and Bending forward Pads: Yes Type: Poise, Lvl 4 Amount: 3x/day Bladder control (0-10): 4/10  GASTROINTESTINAL HISTORY Pain with bowel movement: No Type of bowel movement:Type (Bristol Stool Scale) Type 1 and Type 4, Frequency 1x/day, and Strain No Frequency: 1x/day Fully empty rectum: No Leakage: Yes, mucus like but can be formed too, 5x/week Pads: Yes - wears a long one for urinary incontinence  Stool softner- every now and then    SEXUAL HISTORY/FUNCTION Pt has no concerns    OBSTETRICAL HISTORY Vaginal deliveries: G3P3 Tearing: Yes - episiotomy with first child    GYNECOLOGICAL HISTORY Hysterectomy: no Pelvic Organ Prolapse: none Heaviness/pressure: no   SUBJECTIVE:  Pt had an episode of N/T on the R leg but went to her chiropractor and he adjusted her. Pt feels better.    PAIN:  Are you having pain? No, some numbness at R shin    TODAY'S TREATMENT  Neuromuscular Re-education:  Pre-treatment assessment  OBJECTIVE:    COGNITION: Overall  cognitive status: Within functional limits for tasks assessed     POSTURE:  Thoracic kyphosis: slight in standing Iliac crest height: L iliac crest Pelvic obliquity: WNL   SENSATION:  Light touch: intact, L2-S2 dermatomes     RANGE OF MOTION:    (Norm range in degrees)  LEFT 01/14/22 RIGHT 01/14/22  Lumbar forward flexion (65):  WNL    Lumbar extension (30): WNL    Lumbar lateral flexion (25):  WNL WNL  Thoracic and Lumbar rotation (30 degrees):    WNL WNL  Hip Flexion (0-125):   WNL WNL  Hip IR (0-45):  WNL WNL  Hip ER (0-45):  WNL WNL  Hip Adduction:      Hip Abduction (0-40):  WNL WNL  Hip extension (0-15):     (*= pain, Blank rows = not tested)   STRENGTH: MMT   RLE 01/14/22 LLE 01/14/22  Hip Flexion 4* 4  Hip Extension 4 4  Hip Abduction     Hip Adduction     Hip ER  5 5  Hip IR  4* 5  Knee Extension 5 5  Knee Flexion 4 4  Dorsiflexion     Plantarflexion (seated) 5 5  (*= pain, Blank rows = not tested)   SPECIAL TESTS:  FABER (SN 81): negative B FADIR (SN 94): negative B   PALPATION:  Abdominal:  Diastasis: none Rib flare: present B Tension felt throughout the abdominal cavity   R gluteal musculature and piriformis with significant tension compared to L, causing discomfort upon palpation   EXTERNAL PELVIC EXAM: Patient educated on the purpose of the pelvic exam and articulated understanding; patient consented to the exam verbally.  Breath coordination: inconsistent Voluntary Contraction: present, 2/5 MMT with Valsalva Relaxation: delayed Perineal movement with sustained IAP increase ("bear down"): elevation with significant pelvic movement and Valsalva Perineal movement with rapid IAP increase ("cough"): no change (0= no contraction, 1= flicker, 2= weak squeeze, 3= fair squeeze with lift, 4= good squeeze and lift against resistance, 5= strong squeeze against strong resistance)   Neuromuscular Re-education: Supine hooklying diaphragmatic breathing  with VCs and TCs for downregulation of the nervous system and improved management of IAP  Seated piriformis intervention for improved tissue extensibility and pain modulation   Discussion and demonstration on log roll technique for improved IAP management, VCs and TCs required to decrease Valsalva maneuver   Patient response to interventions: Pt occasionally said she gets a cramp at the breast bone but cleared with cardiology. Pt feels that she is learning a new way to breathe   Patient Education:  Patient provided with HEP including: supine diaphragmatic breathing and seated piriformis stretch. Patient verbalized understanding and returned demonstration. Patient educated throughout session on appropriate technique and form using multi-modal cueing, HEP, and activity modification. Patient will benefit from further education in order to maximize compliance and understanding for long-term therapeutic gains.   ASSESSMENT:  Clinical Impression: Patient with excellent motivation to participate in today's session. Upon physical examination, Pt demonstrates deficits in IAP management, PFM coordination, PFM strength, PFM extensibility, LE strength, posture and pain, as evidenced by increased L iliac crest height, B rib flare, 4/5 MMT with B hip flexion/ext/knee flexion, 4/5 MMT with R IR and increased pain at buttocks, muscular tension at R gluteal muscles/piriformis compared to L with discomfort, inconsistent breath coordination, 2/5 MMT PFM with Valsalva, delayed PFM relaxation, and elevation with sustained IAP increase (bear down) with significant activation of pelvis (posterior pelvic tilt) and abdominals with observed Valsalva. Pt required significant VCs and TCs during supine diaphragmatic breathing to allow natural elevation/descent of abdomen. Discussion on abdominal tension and rib flare which can contribute to the difficulty in mobility of the diaphragm. Pt verbalized understanding. Pt responded  positively to all active and educational interventions. Patient will continue to benefit from skilled therapeutic intervention to address deficits in IAP management, PFM coordination, PFM strength, PFM extensibility, LE strength, posture and pain in order to increase PLOF and improve overall QOL.    Objective Impairments: decreased coordination, decreased endurance, decreased strength, improper body mechanics, postural dysfunction, and pain.   Activity Limitations: lifting, bending, sitting, standing, squatting, continence, toileting, and locomotion level  Personal Factors: Age, Behavior pattern, Past/current experiences, Time since onset of injury/illness/exacerbation, and  3+ comorbidities: colitis, COPD, HTN, diverticulitis  are also affecting patient's functional outcome.   Rehab Potential: Good  Clinical Decision Making: Evolving/moderate complexity  Evaluation Complexity: Moderate   GOALS: Goals reviewed with patient? Yes  SHORT TERM GOALS: Target date: 02/25/2022  Patient will demonstrate independence with HEP in order to maximize therapeutic gains and improve carryover from physical therapy sessions to ADLs in the home and community. Baseline: toileting posture/skin protection pads handout,  Goal status: INITIAL    LONG TERM GOALS: Target date: 04/08/2022   Patient will score >/= 60 on FOTO Urinary Problem  in order to demonstrate improved IAP management, improved PFM coordination, and overall improved QOL.  Baseline: 51 Goal status: INITIAL  2.  Patient will report confidence in ability to control bladder > 7/10 in order to demonstrate improved function and ability to participate more fully in activities at home and in the community. Baseline: 4/10 Goal status: INITIAL  3.  Patient will report being able to return to activities including, but not limited to: gardening, walking, physical fitness classes, household chores without disruption or limitation to indicate complete  resolution of the chief concern and return to prior level of participation at home and in the community. Baseline: has leakage with all the above Goal status: INITIAL  4.  Patient will report decreased reliance on protective undergarments as indicated by a 24 hour period to demonstrate improved bladder control and allow for increased participation in activities outside of the home. Baseline: Lvl 4 incontinence Poise and 3x/day Goal status: INITIAL  5.  Patient will demonstrate independent and coordinated diaphragmatic breathing in supine with a 1:2 breathing pattern for improved down-regulation of the nervous system and improved management of intra-abdominal pressures in order to increase function at home and in the community. Baseline: inconsistent breath/activation of abdominals on exhale  Goal status: INITIAL  6.  Patient will report less than 5 incidents of stress urinary incontinence over the course of 3 weeks while physical activity/coughing/sneezing/laughing/prolonged activity in order to demonstrate improved PFM coordination, strength, and function for improved overall QOL. Baseline: urinary leakage with all the above, bowel leakage more with physical activity  Goal status: INITIAL  PLAN: PT Frequency: 1x/week  PT Duration: 12 weeks  Planned Interventions: Therapeutic exercises, Therapeutic activity, Neuromuscular re-education, Balance training, Gait training, Patient/Family education, Self Care, Joint mobilization, Cryotherapy, Moist heat, scar mobilization, Taping, and Manual therapy  Plan For Next Session: rib flare technique, myofascial abdominal release, begin deep core?   Lillyann Ahart, PT, DPT  01/14/2022, 9:28 AM

## 2022-01-20 DIAGNOSIS — M62830 Muscle spasm of back: Secondary | ICD-10-CM | POA: Diagnosis not present

## 2022-01-20 DIAGNOSIS — M5136 Other intervertebral disc degeneration, lumbar region: Secondary | ICD-10-CM | POA: Diagnosis not present

## 2022-01-20 DIAGNOSIS — M9905 Segmental and somatic dysfunction of pelvic region: Secondary | ICD-10-CM | POA: Diagnosis not present

## 2022-01-20 DIAGNOSIS — M9903 Segmental and somatic dysfunction of lumbar region: Secondary | ICD-10-CM | POA: Diagnosis not present

## 2022-01-21 ENCOUNTER — Ambulatory Visit: Payer: Medicare PPO

## 2022-01-21 DIAGNOSIS — R278 Other lack of coordination: Secondary | ICD-10-CM | POA: Diagnosis not present

## 2022-01-21 DIAGNOSIS — M6281 Muscle weakness (generalized): Secondary | ICD-10-CM

## 2022-01-21 DIAGNOSIS — M6289 Other specified disorders of muscle: Secondary | ICD-10-CM

## 2022-01-21 NOTE — Therapy (Addendum)
OUTPATIENT PHYSICAL THERAPY FEMALE PELVIC TREATMENT   Patient Name: Savannah Cox MRN: [id number] DOB:1946-03-15, 76 y.o., female Today's Date: 01/21/2022   PT End of Session - 01/21/22 0928     Visit Number 3    Number of Visits 12    Date for PT Re-Evaluation 03/31/22    PT Start Time 0930    PT Stop Time 1008    PT Time Calculation (min) 38 min    Activity Tolerance Patient tolerated treatment well             Past Medical History:  Diagnosis Date   Allergic state    Anemia    Complication of anesthesia    Diverticulosis    GERD (gastroesophageal reflux disease)    History of hiatal hernia    History of kidney stones    Hypercholesterolemia    Hypertension    Hypothyroidism    Osteoporosis, postmenopausal    PONV (postoperative nausea and vomiting)    nauseated with colonoscopy   Shingles    Past Surgical History:  Procedure Laterality Date   BREAST BIOPSY Left 90s   benign   COLONOSCOPY WITH PROPOFOL N/A 01/28/2016   Procedure: COLONOSCOPY WITH PROPOFOL;  Surgeon: Lollie Sails, MD;  Location: [facility] ENDOSCOPY;  Service: Endoscopy;  Laterality: N/A;   EXCISION METACARPAL MASS Right 04/30/2017   Procedure: EXCISION MUCOID CYST FIFTH FINGER RIGHT HAND;  Surgeon: Hessie Knows, MD;  Location: [facility] ORS;  Service: Orthopedics;  Laterality: Right;   EYE SURGERY Bilateral 2010   cataracts   Patient Active Problem List   Diagnosis Date Noted   Leg cramps 07/14/2021   Kidney cysts 03/20/2021   Chest pain 02/14/2021   Monocytosis 02/14/2021   Abnormal liver function test 02/14/2021   Back pain 10/11/2020   Centrilobular emphysema ([place]) 02/13/2020   Former smoker 02/13/2020   Sleeping difficulties 01/22/2020   Leukopenia 09/12/2019   SOB (shortness of breath) 06/06/2019   Chest tightness 03/20/2019   Osteoporosis 07/12/2018   Bilateral hip pain 06/12/2018   Aortic atherosclerosis ([place]) 10/10/2017   Finger pain, right 07/05/2017   Acute colitis  04/17/2016   Diarrhea 04/17/2016   Hypokalemia 04/17/2016   Headache 04/17/2016   GIB (gastrointestinal bleeding) 04/14/2016   History of colonic polyps 01/31/2016   Change in bowel movement 10/21/2015   Pain of both thumbs 01/14/2015   Right hip pain 01/10/2015   Health care maintenance 09/04/2014   Abnormal mammogram 09/25/2013   Abdominal pain 02/22/2013   Anemia 05/01/2012   GERD (gastroesophageal reflux disease) 05/01/2012   Hematuria 05/01/2012   Hypothyroidism 05/01/2012   Hypercholesterolemia 05/01/2012   Hypertension 05/01/2012    PCP: Einar Pheasant, MD  REFERRING PROVIDER:  [phone] (ICD-10-CM) - Pelvic floor dysfunction   REFERRING DIAG: Ok Edwards, NP   THERAPY DIAG:  Pelvic floor dysfunction  Other lack of coordination  Muscle weakness (generalized)  Rationale for Evaluation and Treatment: Rehabilitation  ONSET DATE: 5 years ago  PRECAUTIONS: None  WEIGHT BEARING RESTRICTIONS: No  FALLS:  Has patient fallen in last 6 months? No  OCCUPATION/SOCIAL ACTIVITIES: Retired, gardening, reading, walking, Silver [employer]neakers once/week   CHIEF CONCERN: Pt has had a loss of bladder control and she feels it is getting worse because she is having bowel leakage as well. Bowel leakage began about a year ago. Urinary leakage began 5 years ago and she began wearing a thin pad. Urinary leakage has become worse. Physical activity, squatting, sneezing, coughing, laughing all causes urinary leakage. When Pt gardens, she feels her pad is soiled after. Pt at times feels she cannot sense her bowel movements coming out when she sits on the toilet to urinate. Pt does not have pain with bowel movements or straining but sometimes will push so more comes out in order to not have bowel  leakage. Bowel leakage is noticed more with walking/exercising or after a Silver Sneaker class. Pt has had instances of both urinary and bowel leakage when performing physical activity.   NPRS scale: 0/10 (current), 3/10 (worst) Pain location: lower back   Pain type: aching Pain description: intermittent   Aggravating factors: in the morning Relieving factors: chiropractor once a month, heat   LIVING ENVIRONMENT: Lives with: lives with their spouse Lives in: House/apartment   PATIENT GOALS: Not to have bowel leakage and not have as much urine leakage, Pt has COPD and wants to not leak as much with coughing     UROLOGICAL HISTORY Fluid intake: 2 cups of coffee in morning, water during day, 1 soft drink a day, milk   Pain with urination: No Fully empty bladder: No  Stream: constant  Urgency: No Frequency: 7-8x/day Nocturia: 1x  Leakage: Walking to the bathroom, Coughing, Sneezing, Laughing, Exercise, Lifting, and Bending forward Pads: Yes Type: Poise, Lvl 4 Amount: 3x/day Bladder control (0-10): 4/10  GASTROINTESTINAL HISTORY Pain with bowel movement: No Type of bowel movement:Type (Bristol Stool Scale) Type 1 and Type 4, Frequency 1x/day, and Strain No Frequency: 1x/day Fully empty rectum: No Leakage: Yes, mucus like but can be formed too, 5x/week Pads: Yes - wears a long one for urinary incontinence  Stool softner- every now and then    SEXUAL HISTORY/FUNCTION Pt has no concerns    OBSTETRICAL HISTORY Vaginal deliveries: G3P3 Tearing: Yes - episiotomy with first child    GYNECOLOGICAL HISTORY Hysterectomy: no Pelvic Organ Prolapse: none Heaviness/pressure: no   SUBJECTIVE:  Pt has been able to practice her breathing exercises at home and is going well.   PAIN:  Are you having pain? No    OBJECTIVE:    COGNITION: Overall cognitive status: Within functional limits for tasks assessed     POSTURE:  Thoracic kyphosis: slight in standing Iliac crest  height: L iliac crest Pelvic obliquity: WNL   SENSATION:  Light touch: intact, L2-S2 dermatomes     RANGE OF MOTION:    (Norm range in degrees)  LEFT 01/14/22 RIGHT 01/14/22  Lumbar forward flexion (65):  WNL    Lumbar extension (30): WNL    Lumbar lateral flexion (25):  WNL WNL  Thoracic and Lumbar rotation (30 degrees):    WNL WNL  Hip Flexion (0-125):   WNL WNL  Hip IR (0-45):  WNL WNL  Hip ER (0-45):  WNL WNL  Hip Adduction:      Hip Abduction (0-40):  WNL WNL  Hip extension (0-15):     (*= pain, Blank rows = not tested)   STRENGTH: MMT   RLE  01/14/22 LLE 01/14/22  Hip Flexion 4* 4  Hip Extension 4 4  Hip Abduction     Hip Adduction     Hip ER  5 5  Hip IR  4* 5  Knee Extension 5 5  Knee Flexion 4 4  Dorsiflexion     Plantarflexion (seated) 5 5  (*= pain, Blank rows = not tested)   SPECIAL TESTS:  FABER (SN 81): negative B FADIR (SN 94): negative B   PALPATION:  Abdominal:  Diastasis: none Rib flare: present B Tension felt throughout the abdominal cavity   R gluteal musculature and piriformis with significant tension compared to L, causing discomfort upon palpation   EXTERNAL PELVIC EXAM: Patient educated on the purpose of the pelvic exam and articulated understanding; patient consented to the exam verbally.  Breath coordination: inconsistent Voluntary Contraction: present, 2/5 MMT with Valsalva Relaxation: delayed Perineal movement with sustained IAP increase ("bear down"): elevation with significant pelvic movement and Valsalva Perineal movement with rapid IAP increase ("cough"): no change (0= no contraction, 1= flicker, 2= weak squeeze, 3= fair squeeze with lift, 4= good squeeze and lift against resistance, 5= strong squeeze against strong resistance)   TODAY'S TREATMENT   Manual Therapy: Abdominal myofascial release throughout abdominal cavity for improved muscular tension and fascial slings   Diaphragmatic release B with coordinated exhale for  improved mobility of diaphragm  Neuromuscular Re-education: Supine hooklying diaphragmatic breathing with VCs and TCs for downregulation of the nervous system and improved management of IAP  Supine hooklying PFM lengthening techniques with diaphragmatic breathing, VCs and TCs as needed              B Single knee to chest   Double knee to chest              "Happy baby" pose   Child's pose with pillow behind bottom   R sidelying thoracic rotation, x10, for improved lengthening of the anterior fascial slings    Patient response to interventions: Pt felt less tension in the abdominal cavity at end of session   Patient Education:  Patient provided with HEP including: sidelying thoracic rotation, PFM lengthening techniques above. Patient educated throughout session on appropriate technique and form using multi-modal cueing, HEP, and activity modification. Patient will benefit from further education in order to maximize compliance and understanding for long-term therapeutic gains.   ASSESSMENT:  Clinical Impression: Patient with excellent motivation to participate in today's session. Pt continues to demonstrate deficits in IAP management, PFM coordination, PFM strength, PFM extensibility, LE strength, posture and pain. Upon manual intervention, Pt with improved mobility at the diaphragm (Pt able to take a nice slow inhale without restriction) and decreased tension noted in the abdominal cavity after technique. Pt also reports feeling a "release" at her abdomen after technique. Pt required moderate VCs and TCs to decrease bodily compensations (increased tension of B shoulders) during active interventions. Pt responded positively to manual, active, and educational interventions. Patient will continue to benefit from skilled therapeutic intervention to address deficits in IAP management, PFM coordination, PFM strength, PFM extensibility, LE strength, posture and pain in order to increase PLOF and  improve overall QOL.    Objective Impairments: decreased coordination, decreased endurance, decreased strength, improper body mechanics, postural dysfunction, and pain.   Activity Limitations: lifting, bending, sitting, standing, squatting, continence, toileting, and locomotion level  Personal Factors: Age, Behavior pattern, Past/current experiences, Time since onset of injury/illness/exacerbation, and 3+ comorbidities: colitis, COPD, HTN, diverticulitis  are also affecting patient's functional  outcome.   Rehab Potential: Good  Clinical Decision Making: Evolving/moderate complexity  Evaluation Complexity: Moderate   GOALS: Goals reviewed with patient? Yes  SHORT TERM GOALS: Target date: 03/04/2022  Patient will demonstrate independence with HEP in order to maximize therapeutic gains and improve carryover from physical therapy sessions to ADLs in the home and community. Baseline: toileting posture/skin protection pads handout,  Goal status: INITIAL    LONG TERM GOALS: Target date: 04/15/2022   Patient will score >/= 60 on FOTO Urinary Problem  in order to demonstrate improved IAP management, improved PFM coordination, and overall improved QOL.  Baseline: 51 Goal status: INITIAL  2.  Patient will report confidence in ability to control bladder > 7/10 in order to demonstrate improved function and ability to participate more fully in activities at home and in the community. Baseline: 4/10 Goal status: INITIAL  3.  Patient will report being able to return to activities including, but not limited to: gardening, walking, physical fitness classes, household chores without disruption or limitation to indicate complete resolution of the chief concern and return to prior level of participation at home and in the community. Baseline: has leakage with all the above Goal status: INITIAL  4.  Patient will report decreased reliance on protective undergarments as indicated by a 24 hour period  to demonstrate improved bladder control and allow for increased participation in activities outside of the home. Baseline: Lvl 4 incontinence Poise and 3x/day Goal status: INITIAL  5.  Patient will demonstrate independent and coordinated diaphragmatic breathing in supine with a 1:2 breathing pattern for improved down-regulation of the nervous system and improved management of intra-abdominal pressures in order to increase function at home and in the community. Baseline: inconsistent breath/activation of abdominals on exhale  Goal status: INITIAL  6.  Patient will report less than 5 incidents of stress urinary incontinence over the course of 3 weeks while physical activity/coughing/sneezing/laughing/prolonged activity in order to demonstrate improved PFM coordination, strength, and function for improved overall QOL. Baseline: urinary leakage with all the above, bowel leakage more with physical activity  Goal status: INITIAL  PLAN: PT Frequency: 1x/week  PT Duration: 12 weeks  Planned Interventions: Therapeutic exercises, Therapeutic activity, Neuromuscular re-education, Balance training, Gait training, Patient/Family education, Self Care, Joint mobilization, Cryotherapy, Moist heat, scar mobilization, Taping, and Manual therapy  Plan For Next Session: start deep core? B rib flare/myofascial technique    Berkleigh Beckles, PT, DPT  01/21/2022, 10:20 AM

## 2022-01-27 DIAGNOSIS — M5136 Other intervertebral disc degeneration, lumbar region: Secondary | ICD-10-CM | POA: Diagnosis not present

## 2022-01-27 DIAGNOSIS — M62830 Muscle spasm of back: Secondary | ICD-10-CM | POA: Diagnosis not present

## 2022-01-27 DIAGNOSIS — M9903 Segmental and somatic dysfunction of lumbar region: Secondary | ICD-10-CM | POA: Diagnosis not present

## 2022-01-27 DIAGNOSIS — M9905 Segmental and somatic dysfunction of pelvic region: Secondary | ICD-10-CM | POA: Diagnosis not present

## 2022-01-28 ENCOUNTER — Ambulatory Visit: Payer: Medicare PPO

## 2022-01-28 DIAGNOSIS — R278 Other lack of coordination: Secondary | ICD-10-CM

## 2022-01-28 DIAGNOSIS — M6281 Muscle weakness (generalized): Secondary | ICD-10-CM

## 2022-01-28 DIAGNOSIS — M6289 Other specified disorders of muscle: Secondary | ICD-10-CM

## 2022-01-28 NOTE — Therapy (Signed)
OUTPATIENT PHYSICAL THERAPY FEMALE PELVIC TREATMENT   Patient Name: Savannah Cox MRN: [id number] DOB:August 21, 1945, 76 y.o., female Today's Date: 01/28/2022   PT End of Session - 01/28/22 0929     Visit Number 4    Number of Visits 12    Date for PT Re-Evaluation 03/31/22    Authorization Type IE: 01/06/22    PT Start Time 0930    PT Stop Time 1010    PT Time Calculation (min) 40 min    Activity Tolerance Patient tolerated treatment well             Past Medical History:  Diagnosis Date   Allergic state    Anemia    Complication of anesthesia    Diverticulosis    GERD (gastroesophageal reflux disease)    History of hiatal hernia    History of kidney stones    Hypercholesterolemia    Hypertension    Hypothyroidism    Osteoporosis, postmenopausal    PONV (postoperative nausea and vomiting)    nauseated with colonoscopy   Shingles    Past Surgical History:  Procedure Laterality Date   BREAST BIOPSY Left 90s   benign   COLONOSCOPY WITH PROPOFOL N/A 01/28/2016   Procedure: COLONOSCOPY WITH PROPOFOL;  Surgeon: Lollie Sails, MD;  Location: [facility] ENDOSCOPY;  Service: Endoscopy;  Laterality: N/A;   EXCISION METACARPAL MASS Right 04/30/2017   Procedure: EXCISION MUCOID CYST FIFTH FINGER RIGHT HAND;  Surgeon: Hessie Knows, MD;  Location: [facility] ORS;  Service: Orthopedics;  Laterality: Right;   EYE SURGERY Bilateral 2010   cataracts   Patient Active Problem List   Diagnosis Date Noted   Leg cramps 07/14/2021   Kidney cysts 03/20/2021   Chest pain 02/14/2021   Monocytosis 02/14/2021   Abnormal liver function test 02/14/2021   Back pain 10/11/2020   Centrilobular emphysema ([place]) 02/13/2020   Former smoker 02/13/2020   Sleeping difficulties 01/22/2020   Leukopenia 09/12/2019   SOB (shortness of breath) 06/06/2019   Chest tightness 03/20/2019   Osteoporosis 07/12/2018   Bilateral hip pain 06/12/2018   Aortic atherosclerosis ([place]) 10/10/2017   Finger pain, right  07/05/2017   Acute colitis 04/17/2016   Diarrhea 04/17/2016   Hypokalemia 04/17/2016   Headache 04/17/2016   GIB (gastrointestinal bleeding) 04/14/2016   History of colonic polyps 01/31/2016   Change in bowel movement 10/21/2015   Pain of both thumbs 01/14/2015   Right hip pain 01/10/2015   Health care maintenance 09/04/2014   Abnormal mammogram 09/25/2013   Abdominal pain 02/22/2013   Anemia 05/01/2012   GERD (gastroesophageal reflux disease) 05/01/2012   Hematuria 05/01/2012   Hypothyroidism 05/01/2012   Hypercholesterolemia 05/01/2012   Hypertension 05/01/2012    PCP: Einar Pheasant, MD  REFERRING PROVIDER:  [phone] (ICD-10-CM) - Pelvic floor dysfunction   REFERRING DIAG: Ok Edwards, NP   THERAPY DIAG:  Pelvic floor dysfunction  Other lack of coordination  Muscle weakness (generalized)  Rationale for Evaluation and Treatment: Rehabilitation  ONSET DATE: 5 years ago  PRECAUTIONS: None  WEIGHT BEARING RESTRICTIONS: No  FALLS:  Has patient fallen in last 6 months? No  OCCUPATION/SOCIAL ACTIVITIES: Retired, gardening, reading, walking, Silver [employer]neakers once/week   CHIEF CONCERN: Pt has had a loss of bladder control and she feels it is getting worse because she is having bowel leakage as well. Bowel leakage began about a year ago. Urinary leakage began 5 years ago and she began wearing a thin pad. Urinary leakage has become worse. Physical activity, squatting, sneezing, coughing, laughing all causes urinary leakage. When Pt gardens, she feels her pad is soiled after. Pt at times feels she cannot sense her bowel movements coming out when she sits on the toilet to urinate. Pt does not have pain with bowel movements or straining but sometimes will push so more comes out in  order to not have bowel leakage. Bowel leakage is noticed more with walking/exercising or after a Silver Sneaker class. Pt has had instances of both urinary and bowel leakage when performing physical activity.   NPRS scale: 0/10 (current), 3/10 (worst) Pain location: lower back   Pain type: aching Pain description: intermittent   Aggravating factors: in the morning Relieving factors: chiropractor once a month, heat   LIVING ENVIRONMENT: Lives with: lives with their spouse Lives in: House/apartment   PATIENT GOALS: Not to have bowel leakage and not have as much urine leakage, Pt has COPD and wants to not leak as much with coughing     UROLOGICAL HISTORY Fluid intake: 2 cups of coffee in morning, water during day, 1 soft drink a day, milk   Pain with urination: No Fully empty bladder: No  Stream: constant  Urgency: No Frequency: 7-8x/day Nocturia: 1x  Leakage: Walking to the bathroom, Coughing, Sneezing, Laughing, Exercise, Lifting, and Bending forward Pads: Yes Type: Poise, Lvl 4 Amount: 3x/day Bladder control (0-10): 4/10  GASTROINTESTINAL HISTORY Pain with bowel movement: No Type of bowel movement:Type (Bristol Stool Scale) Type 1 and Type 4, Frequency 1x/day, and Strain No Frequency: 1x/day Fully empty rectum: No Leakage: Yes, mucus like but can be formed too, 5x/week Pads: Yes - wears a long one for urinary incontinence  Stool softner- every now and then    SEXUAL HISTORY/FUNCTION Pt has no concerns    OBSTETRICAL HISTORY Vaginal deliveries: G3P3 Tearing: Yes - episiotomy with first child    GYNECOLOGICAL HISTORY Hysterectomy: no Pelvic Organ Prolapse: none Heaviness/pressure: no   SUBJECTIVE:  Pt is performing HEP and has no questions or problems with that.     PAIN:  Are you having pain? No    OBJECTIVE:    COGNITION: Overall cognitive status: Within functional limits for tasks assessed     POSTURE:  Thoracic kyphosis: slight in  standing Iliac crest height: L iliac crest Pelvic obliquity: WNL   SENSATION:  Light touch: intact, L2-S2 dermatomes     RANGE OF MOTION:    (Norm range in degrees)  LEFT 01/14/22 RIGHT 01/14/22  Lumbar forward flexion (65):  WNL    Lumbar extension (30): WNL    Lumbar lateral flexion (25):  WNL WNL  Thoracic and Lumbar rotation (30 degrees):    WNL WNL  Hip Flexion (0-125):   WNL WNL  Hip IR (0-45):  WNL WNL  Hip ER (0-45):  WNL WNL  Hip Adduction:      Hip Abduction (0-40):  WNL WNL  Hip extension (0-15):     (*= pain, Blank rows = not tested)   STRENGTH: MMT   RLE 01/14/22  LLE 01/14/22  Hip Flexion 4* 4  Hip Extension 4 4  Hip Abduction     Hip Adduction     Hip ER  5 5  Hip IR  4* 5  Knee Extension 5 5  Knee Flexion 4 4  Dorsiflexion     Plantarflexion (seated) 5 5  (*= pain, Blank rows = not tested)   SPECIAL TESTS:  FABER (SN 81): negative B FADIR (SN 94): negative B   PALPATION:  Abdominal:  Diastasis: none Rib flare: present B Tension felt throughout the abdominal cavity   R gluteal musculature and piriformis with significant tension compared to L, causing discomfort upon palpation   EXTERNAL PELVIC EXAM: Patient educated on the purpose of the pelvic exam and articulated understanding; patient consented to the exam verbally.  Breath coordination: inconsistent Voluntary Contraction: present, 2/5 MMT with Valsalva Relaxation: delayed Perineal movement with sustained IAP increase ("bear down"): elevation with significant pelvic movement and Valsalva Perineal movement with rapid IAP increase ("cough"): no change (0= no contraction, 1= flicker, 2= weak squeeze, 3= fair squeeze with lift, 4= good squeeze and lift against resistance, 5= strong squeeze against strong resistance)   TODAY'S TREATMENT   Manual Therapy: Abdominal myofascial release throughout abdominal cavity for improved muscular tension and fascial slings  Increased tension felt at RLQ but  improved with increased time   Diaphragmatic release B with coordinated exhale for improved mobility of diaphragm  Neuromuscular Re-education: Supine hooklying diaphragmatic breathing with VCs and TCs for downregulation of the nervous system and improved management of IAP  Seated diaphragmatic breathing with VCs and TCs for downregulation of the nervous system and improved management of IAP   [facility]   Supine hooklying TrA contraction with coordinated exhale, significant cueing required   Discussion on diaphragmatic breathing to downregulate nervous system especially before bed as Pt reports it takes about up to an hour to fall asleep.   Patient response to interventions: Pt able to feel subtle tightening of TrA after significant cueing    Patient Education:  Patient provided with HEP including: supine TrA activation. Patient educated throughout session on appropriate technique and form using multi-modal cueing, HEP, and activity modification. Patient will benefit from further education in order to maximize compliance and understanding for long-term therapeutic gains.   ASSESSMENT:  Clinical Impression: Patient with excellent motivation to participate in today's session. Pt continues to demonstrate deficits in IAP management, PFM coordination, PFM strength, PFM extensibility, LE strength, posture and pain. Upon manual intervention, Pt with increased abdominal tension specifically at the RLQ but with no discomfort. With increased time of intervention, tension improved. Pt required significant VCs and TCs to decrease bodily compensations (posterior pelvic tilt and forceful draw in of superficial abdominals) during TrA activation. After increased time, Pt able to feel subtle lower abdominal tightening of TrA without compensations. Pt responded positively to manual, active, and educational interventions. Patient will continue to benefit from skilled therapeutic intervention to  address deficits in IAP management, PFM coordination, PFM strength, PFM extensibility, LE strength, posture and pain in order to increase PLOF and improve overall QOL.    Objective Impairments: decreased coordination, decreased endurance, decreased strength, improper body mechanics, postural dysfunction, and pain.   Activity Limitations: lifting, bending, sitting, standing, squatting, continence, toileting, and locomotion level  Personal Factors: Age, Behavior pattern, Past/current experiences, Time since onset of injury/illness/exacerbation, and 3+ comorbidities: colitis, COPD, HTN, diverticulitis  are also affecting patient's functional outcome.   Rehab Potential: Good  Clinical  Decision Making: Evolving/moderate complexity  Evaluation Complexity: Moderate   GOALS: Goals reviewed with patient? Yes  SHORT TERM GOALS: Target date: 03/11/2022  Patient will demonstrate independence with HEP in order to maximize therapeutic gains and improve carryover from physical therapy sessions to ADLs in the home and community. Baseline: toileting posture/skin protection pads handout,  Goal status: INITIAL    LONG TERM GOALS: Target date: 04/22/2022   Patient will score >/= 60 on FOTO Urinary Problem  in order to demonstrate improved IAP management, improved PFM coordination, and overall improved QOL.  Baseline: 51 Goal status: INITIAL  2.  Patient will report confidence in ability to control bladder > 7/10 in order to demonstrate improved function and ability to participate more fully in activities at home and in the community. Baseline: 4/10 Goal status: INITIAL  3.  Patient will report being able to return to activities including, but not limited to: gardening, walking, physical fitness classes, household chores without disruption or limitation to indicate complete resolution of the chief concern and return to prior level of participation at home and in the community. Baseline: has leakage  with all the above Goal status: INITIAL  4.  Patient will report decreased reliance on protective undergarments as indicated by a 24 hour period to demonstrate improved bladder control and allow for increased participation in activities outside of the home. Baseline: Lvl 4 incontinence Poise and 3x/day Goal status: INITIAL  5.  Patient will demonstrate independent and coordinated diaphragmatic breathing in supine with a 1:2 breathing pattern for improved down-regulation of the nervous system and improved management of intra-abdominal pressures in order to increase function at home and in the community. Baseline: inconsistent breath/activation of abdominals on exhale  Goal status: INITIAL  6.  Patient will report less than 5 incidents of stress urinary incontinence over the course of 3 weeks while physical activity/coughing/sneezing/laughing/prolonged activity in order to demonstrate improved PFM coordination, strength, and function for improved overall QOL. Baseline: urinary leakage with all the above, bowel leakage more with physical activity  Goal status: INITIAL  PLAN: PT Frequency: 1x/week  PT Duration: 12 weeks  Planned Interventions: Therapeutic exercises, Therapeutic activity, Neuromuscular re-education, Balance training, Gait training, Patient/Family education, Self Care, Joint mobilization, Cryotherapy, Moist heat, scar mobilization, Taping, and Manual therapy  Plan For Next Session: how was deep core?, deep core progression quadr/seated   Meggen Spaziani, PT, DPT  01/28/2022, 9:30 AM

## 2022-01-31 ENCOUNTER — Other Ambulatory Visit (INDEPENDENT_AMBULATORY_CARE_PROVIDER_SITE_OTHER): Payer: Medicare PPO

## 2022-01-31 DIAGNOSIS — R7989 Other specified abnormal findings of blood chemistry: Secondary | ICD-10-CM

## 2022-01-31 DIAGNOSIS — I10 Essential (primary) hypertension: Secondary | ICD-10-CM

## 2022-01-31 DIAGNOSIS — E7800 Pure hypercholesterolemia, unspecified: Secondary | ICD-10-CM | POA: Diagnosis not present

## 2022-01-31 LAB — BASIC METABOLIC PANEL
BUN: 14 mg/dL (ref 6–23)
CO2: 26 mEq/L (ref 19–32)
Calcium: 9 mg/dL (ref 8.4–10.5)
Chloride: 105 mEq/L (ref 96–112)
Creatinine, Ser: 0.81 mg/dL (ref 0.40–1.20)
GFR: 71.63 mL/min (ref >60.00)
Glucose, Bld: 99 mg/dL (ref 70–99)
Potassium: 3.8 mEq/L (ref 3.5–5.1)
Sodium: 141 mEq/L (ref 135–145)

## 2022-01-31 LAB — HEPATIC FUNCTION PANEL
ALT: 26 U/L (ref 0–35)
AST: 35 U/L (ref 0–37)
Albumin: 4.2 g/dL (ref 3.5–5.2)
Alkaline Phosphatase: 57 U/L (ref 39–117)
Bilirubin, Direct: 0.1 mg/dL (ref 0.0–0.3)
Total Bilirubin: 0.5 mg/dL (ref 0.2–1.2)
Total Protein: 6.5 g/dL (ref 6.0–8.3)

## 2022-01-31 LAB — LIPID PANEL
Cholesterol: 212 mg/dL — ABNORMAL HIGH (ref 0–200)
HDL: 79.9 mg/dL (ref >39.00)
LDL Cholesterol: 118 mg/dL — ABNORMAL HIGH (ref 0–99)
NonHDL: 132.45
Total CHOL/HDL Ratio: 3
Triglycerides: 72 mg/dL (ref 0.0–149.0)
VLDL: 14.4 mg/dL (ref 0.0–40.0)

## 2022-02-04 ENCOUNTER — Ambulatory Visit: Payer: Medicare PPO

## 2022-02-04 DIAGNOSIS — R278 Other lack of coordination: Secondary | ICD-10-CM

## 2022-02-04 DIAGNOSIS — M6281 Muscle weakness (generalized): Secondary | ICD-10-CM | POA: Diagnosis not present

## 2022-02-04 DIAGNOSIS — M6289 Other specified disorders of muscle: Secondary | ICD-10-CM

## 2022-02-04 NOTE — Therapy (Signed)
OUTPATIENT PHYSICAL THERAPY FEMALE PELVIC TREATMENT   Patient Name: Savannah Cox MRN: [id number] DOB:06-03-1946, 76 y.o., female Today's Date: 02/04/2022   PT End of Session - 02/04/22 0933     Visit Number 5    Number of Visits 12    Date for PT Re-Evaluation 03/31/22    Authorization Type IE: 01/06/22    PT Start Time 0931    PT Stop Time 1011    PT Time Calculation (min) 40 min    Activity Tolerance Patient tolerated treatment well             Past Medical History:  Diagnosis Date   Allergic state    Anemia    Complication of anesthesia    Diverticulosis    GERD (gastroesophageal reflux disease)    History of hiatal hernia    History of kidney stones    Hypercholesterolemia    Hypertension    Hypothyroidism    Osteoporosis, postmenopausal    PONV (postoperative nausea and vomiting)    nauseated with colonoscopy   Shingles    Past Surgical History:  Procedure Laterality Date   BREAST BIOPSY Left 90s   benign   COLONOSCOPY WITH PROPOFOL N/A 01/28/2016   Procedure: COLONOSCOPY WITH PROPOFOL;  Surgeon: Lollie Sails, MD;  Location: [facility] ENDOSCOPY;  Service: Endoscopy;  Laterality: N/A;   EXCISION METACARPAL MASS Right 04/30/2017   Procedure: EXCISION MUCOID CYST FIFTH FINGER RIGHT HAND;  Surgeon: Hessie Knows, MD;  Location: [facility] ORS;  Service: Orthopedics;  Laterality: Right;   EYE SURGERY Bilateral 2010   cataracts   Patient Active Problem List   Diagnosis Date Noted   Leg cramps 07/14/2021   Kidney cysts 03/20/2021   Chest pain 02/14/2021   Monocytosis 02/14/2021   Abnormal liver function test 02/14/2021   Back pain 10/11/2020   Centrilobular emphysema ([place]) 02/13/2020   Former smoker 02/13/2020   Sleeping difficulties 01/22/2020   Leukopenia 09/12/2019   SOB (shortness of breath) 06/06/2019   Chest tightness 03/20/2019   Osteoporosis 07/12/2018   Bilateral hip pain 06/12/2018   Aortic atherosclerosis ([place]) 10/10/2017   Finger pain, right  07/05/2017   Acute colitis 04/17/2016   Diarrhea 04/17/2016   Hypokalemia 04/17/2016   Headache 04/17/2016   GIB (gastrointestinal bleeding) 04/14/2016   History of colonic polyps 01/31/2016   Change in bowel movement 10/21/2015   Pain of both thumbs 01/14/2015   Right hip pain 01/10/2015   Health care maintenance 09/04/2014   Abnormal mammogram 09/25/2013   Abdominal pain 02/22/2013   Anemia 05/01/2012   GERD (gastroesophageal reflux disease) 05/01/2012   Hematuria 05/01/2012   Hypothyroidism 05/01/2012   Hypercholesterolemia 05/01/2012   Hypertension 05/01/2012    PCP: Einar Pheasant, MD  REFERRING PROVIDER:  [phone] (ICD-10-CM) - Pelvic floor dysfunction   REFERRING DIAG: Ok Edwards, NP   THERAPY DIAG:  Pelvic floor dysfunction  Other lack of coordination  Muscle weakness (generalized)  Rationale for Evaluation and Treatment: Rehabilitation  ONSET DATE: 5 years ago  PRECAUTIONS: None  WEIGHT BEARING RESTRICTIONS: No  FALLS:  Has patient fallen in last 6 months? No  OCCUPATION/SOCIAL ACTIVITIES: Retired, gardening, reading, walking, Silver Sneakers once/week   CHIEF CONCERN: Pt has had a loss of bladder control and she feels it is getting worse because she is having bowel leakage as well. Bowel leakage began about a year ago. Urinary leakage began 5 years ago and she began wearing a thin pad. Urinary leakage has become worse. Physical activity, squatting, sneezing, coughing, laughing all causes urinary leakage. When Pt gardens, she feels her pad is soiled after. Pt at times feels she cannot sense her bowel movements coming out when she sits on the toilet to urinate. Pt does not have pain with bowel movements or straining but sometimes will push so more comes out in  order to not have bowel leakage. Bowel leakage is noticed more with walking/exercising or after a Silver Sneaker class. Pt has had instances of both urinary and bowel leakage when performing physical activity.   NPRS scale: 0/10 (current), 3/10 (worst) Pain location: lower back   Pain type: aching Pain description: intermittent   Aggravating factors: in the morning Relieving factors: chiropractor once a month, heat   LIVING ENVIRONMENT: Lives with: lives with their spouse Lives in: House/apartment   PATIENT GOALS: Not to have bowel leakage and not have as much urine leakage, Pt has COPD and wants to not leak as much with coughing     UROLOGICAL HISTORY Fluid intake: 2 cups of coffee in morning, water during day, 1 soft drink a day, milk   Pain with urination: No Fully empty bladder: No  Stream: constant  Urgency: No Frequency: 7-8x/day Nocturia: 1x  Leakage: Walking to the bathroom, Coughing, Sneezing, Laughing, Exercise, Lifting, and Bending forward Pads: Yes Type: Poise, Lvl 4 Amount: 3x/day Bladder control (0-10): 4/10  GASTROINTESTINAL HISTORY Pain with bowel movement: No Type of bowel movement:Type (Bristol Stool Scale) Type 1 and Type 4, Frequency 1x/day, and Strain No Frequency: 1x/day Fully empty rectum: No Leakage: Yes, mucus like but can be formed too, 5x/week Pads: Yes - wears a long one for urinary incontinence  Stool softner- every now and then    SEXUAL HISTORY/FUNCTION Pt has no concerns    OBSTETRICAL HISTORY Vaginal deliveries: G3P3 Tearing: Yes - episiotomy with first child    GYNECOLOGICAL HISTORY Hysterectomy: no Pelvic Organ Prolapse: none Heaviness/pressure: no   SUBJECTIVE:  Pt is doing well and feels that her bowel leakage has not been a problem recently. Pt is also not having as much urinary leakage unless she is coughing really hard.    PAIN:  Are you having pain? No    OBJECTIVE:    COGNITION: Overall cognitive status:  Within functional limits for tasks assessed     POSTURE:  Thoracic kyphosis: slight in standing Iliac crest height: L iliac crest Pelvic obliquity: WNL   SENSATION:  Light touch: intact, L2-S2 dermatomes     RANGE OF MOTION:    (Norm range in degrees)  LEFT 01/14/22 RIGHT 01/14/22  Lumbar forward flexion (65):  WNL    Lumbar extension (30): WNL    Lumbar lateral flexion (25):  WNL WNL  Thoracic and Lumbar rotation (30 degrees):    WNL WNL  Hip Flexion (0-125):   WNL WNL  Hip IR (0-45):  WNL WNL  Hip ER (0-45):  WNL WNL  Hip Adduction:      Hip Abduction (0-40):  WNL WNL  Hip extension (0-15):     (*=  pain, Blank rows = not tested)   STRENGTH: MMT   RLE 01/14/22 LLE 01/14/22  Hip Flexion 4* 4  Hip Extension 4 4  Hip Abduction     Hip Adduction     Hip ER  5 5  Hip IR  4* 5  Knee Extension 5 5  Knee Flexion 4 4  Dorsiflexion     Plantarflexion (seated) 5 5  (*= pain, Blank rows = not tested)   SPECIAL TESTS:  FABER (SN 81): negative B FADIR (SN 94): negative B   PALPATION:  Abdominal:  Diastasis: none Rib flare: present B Tension felt throughout the abdominal cavity   R gluteal musculature and piriformis with significant tension compared to L, causing discomfort upon palpation   EXTERNAL PELVIC EXAM: Patient educated on the purpose of the pelvic exam and articulated understanding; patient consented to the exam verbally.  Breath coordination: inconsistent Voluntary Contraction: present, 2/5 MMT with Valsalva Relaxation: delayed Perineal movement with sustained IAP increase ("bear down"): elevation with significant pelvic movement and Valsalva Perineal movement with rapid IAP increase ("cough"): no change (0= no contraction, 1= flicker, 2= weak squeeze, 3= fair squeeze with lift, 4= good squeeze and lift against resistance, 5= strong squeeze against strong resistance)    TODAY'S TREATMENT    Neuromuscular Re-education: Supine hooklying diaphragmatic  breathing with VCs and TCs for downregulation of the nervous system and improved management of IAP  Review of [facility] abdominal rehab   Supine hooklying TrA contraction with coordinated exhale Supine hooklying TrA w/marches    Seated diaphragmatic breathing with VCs and TCs for downregulation of the nervous system and improved management of IAP  Seated TrA activation with VCs and TCs for improved IAP management   Discussion on exhaling with movement especially during transition movements and during Silver Sneakers. Will assess more about movement during physical activity class next session.    Patient response to interventions: Pt able to feel TrA activation with alternating marches, with increased time Pt able to coordinate movement with activation   Patient Education:  Patient provided with HEP including: supine TrA activation with marches and seated TrA. Patient educated throughout session on appropriate technique and form using multi-modal cueing, HEP, and activity modification. Patient will benefit from further education in order to maximize compliance and understanding for long-term therapeutic gains.   ASSESSMENT:  Clinical Impression: Patient with excellent motivation to participate in today's session. Pt continues to demonstrate deficits in IAP management, PFM coordination, PFM strength, PFM extensibility, LE strength, posture and pain. Pt reports decreased bowel leakage (a couple ago) and decreased urinary leakage during everyday activities. Pt required moderate VCs and TCs to decrease bodily compensations during progression of TrA activation in various positions. Pt responded positively to active and educational interventions. Patient will continue to benefit from skilled therapeutic intervention to address deficits in IAP management, PFM coordination, PFM strength, PFM extensibility, LE strength, posture and pain in order to increase PLOF and improve overall QOL.    Objective  Impairments: decreased coordination, decreased endurance, decreased strength, improper body mechanics, postural dysfunction, and pain.   Activity Limitations: lifting, bending, sitting, standing, squatting, continence, toileting, and locomotion level  Personal Factors: Age, Behavior pattern, Past/current experiences, Time since onset of injury/illness/exacerbation, and 3+ comorbidities: colitis, COPD, HTN, diverticulitis  are also affecting patient's functional outcome.   Rehab Potential: Good  Clinical Decision Making: Evolving/moderate complexity  Evaluation Complexity: Moderate   GOALS: Goals reviewed with patient? Yes  SHORT TERM GOALS: Target date: 03/18/2022  Patient will demonstrate independence with HEP in order to maximize therapeutic gains and improve carryover from physical therapy sessions to ADLs in the home and community. Baseline: toileting posture/skin protection pads handout,  Goal status: INITIAL    LONG TERM GOALS: Target date: 04/29/2022   Patient will score >/= 60 on FOTO Urinary Problem  in order to demonstrate improved IAP management, improved PFM coordination, and overall improved QOL.  Baseline: 51 Goal status: INITIAL  2.  Patient will report confidence in ability to control bladder > 7/10 in order to demonstrate improved function and ability to participate more fully in activities at home and in the community. Baseline: 4/10 Goal status: INITIAL  3.  Patient will report being able to return to activities including, but not limited to: gardening, walking, physical fitness classes, household chores without disruption or limitation to indicate complete resolution of the chief concern and return to prior level of participation at home and in the community. Baseline: has leakage with all the above Goal status: INITIAL  4.  Patient will report decreased reliance on protective undergarments as indicated by a 24 hour period to demonstrate improved bladder  control and allow for increased participation in activities outside of the home. Baseline: Lvl 4 incontinence Poise and 3x/day Goal status: INITIAL  5.  Patient will demonstrate independent and coordinated diaphragmatic breathing in supine with a 1:2 breathing pattern for improved down-regulation of the nervous system and improved management of intra-abdominal pressures in order to increase function at home and in the community. Baseline: inconsistent breath/activation of abdominals on exhale  Goal status: INITIAL  6.  Patient will report less than 5 incidents of stress urinary incontinence over the course of 3 weeks while physical activity/coughing/sneezing/laughing/prolonged activity in order to demonstrate improved PFM coordination, strength, and function for improved overall QOL. Baseline: urinary leakage with all the above, bowel leakage more with physical activity  Goal status: INITIAL  PLAN: PT Frequency: 1x/week  PT Duration: 12 weeks  Planned Interventions: Therapeutic exercises, Therapeutic activity, Neuromuscular re-education, Balance training, Gait training, Patient/Family education, Self Care, Joint mobilization, Cryotherapy, Moist heat, scar mobilization, Taping, and Manual therapy  Plan For Next Session: STSs with breath, go over movements with breath, practice lying<>sitting with breath    Daeshaun Specht, PT, DPT  02/04/2022, 9:34 AM

## 2022-02-05 ENCOUNTER — Ambulatory Visit: Payer: Medicare PPO | Admitting: Internal Medicine

## 2022-02-05 ENCOUNTER — Encounter: Payer: Self-pay | Admitting: Internal Medicine

## 2022-02-05 VITALS — BP 138/70 | HR 72 | Temp 98.5°F | Resp 15 | Ht 63.0 in | Wt 142.2 lb

## 2022-02-05 DIAGNOSIS — D649 Anemia, unspecified: Secondary | ICD-10-CM

## 2022-02-05 DIAGNOSIS — E7800 Pure hypercholesterolemia, unspecified: Secondary | ICD-10-CM

## 2022-02-05 DIAGNOSIS — I10 Essential (primary) hypertension: Secondary | ICD-10-CM

## 2022-02-05 DIAGNOSIS — I700 Atherosclerosis of aorta: Secondary | ICD-10-CM

## 2022-02-05 DIAGNOSIS — R5383 Other fatigue: Secondary | ICD-10-CM

## 2022-02-05 DIAGNOSIS — J432 Centrilobular emphysema: Secondary | ICD-10-CM

## 2022-02-05 DIAGNOSIS — E039 Hypothyroidism, unspecified: Secondary | ICD-10-CM | POA: Diagnosis not present

## 2022-02-05 DIAGNOSIS — K219 Gastro-esophageal reflux disease without esophagitis: Secondary | ICD-10-CM

## 2022-02-05 DIAGNOSIS — M810 Age-related osteoporosis without current pathological fracture: Secondary | ICD-10-CM | POA: Diagnosis not present

## 2022-02-05 DIAGNOSIS — R7989 Other specified abnormal findings of blood chemistry: Secondary | ICD-10-CM

## 2022-02-05 LAB — CBC WITH DIFFERENTIAL/PLATELET
Basophils Absolute: 0.1 10*3/uL (ref 0.0–0.1)
Basophils Relative: 1.5 % (ref 0.0–3.0)
Eosinophils Absolute: 0.1 10*3/uL (ref 0.0–0.7)
Eosinophils Relative: 2.6 % (ref 0.0–5.0)
HCT: 41 % (ref 36.0–46.0)
Hemoglobin: 13.8 g/dL (ref 12.0–15.0)
Lymphocytes Relative: 20.2 % (ref 12.0–46.0)
Lymphs Abs: 1 10*3/uL (ref 0.7–4.0)
MCHC: 33.6 g/dL (ref 30.0–36.0)
MCV: 97.3 fl (ref 78.0–100.0)
Monocytes Absolute: 0.9 10*3/uL (ref 0.1–1.0)
Monocytes Relative: 17.4 % — ABNORMAL HIGH (ref 3.0–12.0)
Neutro Abs: 2.9 10*3/uL (ref 1.4–7.7)
Neutrophils Relative %: 58.3 % (ref 43.0–77.0)
Platelets: 218 10*3/uL (ref 150.0–400.0)
RBC: 4.22 Mil/uL (ref 3.87–5.11)
RDW: 13.5 % (ref 11.5–15.5)
WBC: 5 10*3/uL (ref 4.0–10.5)

## 2022-02-05 LAB — TSH: TSH: 1.78 u[IU]/mL (ref 0.35–5.50)

## 2022-02-05 MED ORDER — LEVOTHYROXINE SODIUM 75 MCG PO TABS: 75.0000 ug | ORAL_TABLET | Freq: Every day | ORAL | 2 refills | Status: DC

## 2022-02-05 MED ORDER — ROSUVASTATIN CALCIUM 10 MG PO TABS: 10.0000 mg | ORAL_TABLET | Freq: Every day | ORAL | 3 refills | Status: DC

## 2022-02-05 MED ORDER — LISINOPRIL 10 MG PO TABS: 10.0000 mg | ORAL_TABLET | Freq: Every day | ORAL | 1 refills | Status: DC

## 2022-02-05 MED ORDER — SERTRALINE HCL 50 MG PO TABS: 50.0000 mg | ORAL_TABLET | Freq: Every day | ORAL | 3 refills | Status: DC

## 2022-02-05 NOTE — Progress Notes (Signed)
Patient ID: Savannah Cox, female   DOB: April 02, 1946, 76 y.o.   MRN: [id number]   Subjective:    Patient ID: Savannah Cox, female    DOB: Dec 25, 1945, 76 y.o.   MRN: [id number]   Patient here for a scheduled follow up.   Chief Complaint  Patient presents with   Hypertension   .   HPI Here to follow up regarding her cholesterol and blood pressure.  Saw GI 12/31/21 - f/u - likely IBS - recommended daily fiber.  Also f/u - elevated AST.  Liver unremarkable.  Recommended diet and exercise.  Some bladder/bowel issues - leakage.  Has been going to PT for pelvic floor therapy.  Sw cardiology 12/30/21 - discussed coronary CTA.  Elected to follow.  Blood pressures - 120/70s.       Past Medical History:  Diagnosis Date   Allergic state    Anemia    Complication of anesthesia    Diverticulosis    GERD (gastroesophageal reflux disease)    History of hiatal hernia    History of kidney stones    Hypercholesterolemia    Hypertension    Hypothyroidism    Osteoporosis, postmenopausal    PONV (postoperative nausea and vomiting)    nauseated with colonoscopy   Shingles    Past Surgical History:  Procedure Laterality Date   BREAST BIOPSY Left 90s   benign   COLONOSCOPY WITH PROPOFOL N/A 01/28/2016   Procedure: COLONOSCOPY WITH PROPOFOL;  Surgeon: Lollie Sails, MD;  Location: [facility] ENDOSCOPY;  Service: Endoscopy;  Laterality: N/A;   EXCISION METACARPAL MASS Right 04/30/2017   Procedure: EXCISION MUCOID CYST FIFTH FINGER RIGHT HAND;  Surgeon: Hessie Knows, MD;  Location: [facility] ORS;  Service: Orthopedics;  Laterality: Right;   EYE SURGERY Bilateral 2010   cataracts   Family History  Problem Relation Age of Onset   Heart disease Father        died age 23 - myocardial infarction   Hypercholesterolemia Mother    Arthritis Mother    Heart attack Mother    Kidney cancer Other        aunt   Breast cancer Maternal Aunt    Colon cancer Neg Hx    Social History   Socioeconomic History    Marital status: Married    Spouse name: Not on file   Number of children: 3   Years of education: 16   Highest education level: Not on file  Occupational History   Occupation: retired Product manager: RETIRED  Tobacco Use   Smoking status: Former    Packs/day: 1.50    Years: 30.00    Total pack years: 45.00    Types: Cigarettes    Quit date: 1990    Years since quitting: 33.6   Smokeless tobacco: Never  Vaping Use   Vaping Use: Never used  Substance and Sexual Activity   Alcohol use: Yes    Alcohol/week: 0.0 standard drinks of alcohol    Comment: Rare drink, one drink per month   Drug use: No   Sexual activity: Yes  Other Topics Concern   Not on file  Social History Narrative   Not on file   Social Determinants of Health   Financial Resource Strain: Low Risk  (10/30/2021)   Overall Financial Resource Strain (CARDIA)    Difficulty of Paying Living Expenses: Not hard at all  Food Insecurity: No Food Insecurity (10/30/2021)   Hunger Vital Sign    Worried About  Running Out of Food in the Last Year: Never true    [place] in the Last Year: Never true  Transportation Needs: No Transportation Needs (10/30/2021)   PRAPARE - Hydrologist (Medical): No    Lack of Transportation (Non-Medical): No  Physical Activity: Sufficiently Active (10/30/2021)   Exercise Vital Sign    Days of Exercise per Week: 3 days    Minutes of Exercise per Session: 60 min  Stress: No Stress Concern Present (10/30/2021)   [place]    Feeling of Stress : Not at all  Social Connections: Unknown (10/30/2021)   Social Connection and Isolation Panel [NHANES]    Frequency of Communication with Friends and Family: More than three times a week    Frequency of Social Gatherings with Friends and Family: More than three times a week    Attends Religious Services: Not on Advertising copywriter or  Organizations: Not on file    Attends Archivist Meetings: Not on file    Marital Status: Married     Review of Systems  Constitutional:  Negative for appetite change and unexpected weight change.  HENT:  Negative for congestion and sinus pressure.   Respiratory:  Negative for cough, chest tightness and shortness of breath.   Cardiovascular:  Negative for chest pain, palpitations and leg swelling.  Gastrointestinal:  Negative for abdominal pain, diarrhea, nausea and vomiting.  Genitourinary:  Negative for difficulty urinating and dysuria.  Musculoskeletal:  Negative for joint swelling and myalgias.  Skin:  Negative for color change and rash.  Neurological:  Negative for dizziness, light-headedness and headaches.  Psychiatric/Behavioral:  Negative for agitation and dysphoric mood.        Objective:     BP 138/70 (BP Location: Left Arm, Patient Position: Sitting, Cuff Size: Small)   Pulse 72   Temp 98.5 F (36.9 C) (Temporal)   Resp 15   Ht '5\' 3"'$  (1.6 m)   Wt 142 lb 3.2 oz (64.5 kg)   SpO2 96%   BMI 25.19 kg/m  Wt Readings from Last 3 Encounters:  02/05/22 142 lb 3.2 oz (64.5 kg)  11/05/21 142 lb (64.4 kg)  10/30/21 143 lb (64.9 kg)    Physical Exam Vitals reviewed.  Constitutional:      General: She is not in acute distress.    Appearance: Normal appearance.  HENT:     Head: Normocephalic and atraumatic.     Right Ear: External ear normal.     Left Ear: External ear normal.  Eyes:     General: No scleral icterus.       Right eye: No discharge.        Left eye: No discharge.     Conjunctiva/sclera: Conjunctivae normal.  Neck:     Thyroid: No thyromegaly.  Cardiovascular:     Rate and Rhythm: Normal rate and regular rhythm.  Pulmonary:     Effort: No respiratory distress.     Breath sounds: Normal breath sounds. No wheezing.  Abdominal:     General: Bowel sounds are normal.     Palpations: Abdomen is soft.     Tenderness: There is no abdominal  tenderness.  Musculoskeletal:        General: No swelling or tenderness.     Cervical back: Neck supple. No tenderness.  Lymphadenopathy:     Cervical: No cervical adenopathy.  Skin:  Findings: No erythema or rash.  Neurological:     Mental Status: She is alert.  Psychiatric:        Mood and Affect: Mood normal.        Behavior: Behavior normal.      Outpatient Encounter Medications as of 02/05/2022  Medication Sig   loratadine (CLARITIN) 10 MG tablet Take 10 mg by mouth daily.    Multiple Vitamins-Minerals (CENTRUM SILVER ULTRA WOMENS PO) Take 1 tablet by mouth daily.    omeprazole (PRILOSEC) 40 MG capsule TAKE 1 CAPSULE (40 MG TOTAL) BY MOUTH ONCE DAILY TAKE 30 MINS BEFORE A MEAL   rosuvastatin (CRESTOR) 10 MG tablet Take 1 tablet (10 mg total) by mouth daily.   TRELEGY ELLIPTA 100-62.5-25 MCG/INH AEPB TAKE 1 PUFF BY MOUTH EVERY DAY   [DISCONTINUED] levothyroxine (SYNTHROID) 75 MCG tablet TAKE 1 TABLET BY MOUTH EVERY DAY   [DISCONTINUED] lisinopril (ZESTRIL) 10 MG tablet TAKE 1 TABLET BY MOUTH EVERY DAY   [DISCONTINUED] rosuvastatin (CRESTOR) 5 MG tablet TAKE 2 TABLETS BY MOUTH ON MONDAY, WEDNESDAY AND FRIDAY. TAKE 1 TABLET ON ALL OTHER DAYS   [DISCONTINUED] sertraline (ZOLOFT) 50 MG tablet TAKE 1 TABLET BY MOUTH EVERY DAY   levothyroxine (SYNTHROID) 75 MCG tablet Take 1 tablet (75 mcg total) by mouth daily.   lisinopril (ZESTRIL) 10 MG tablet Take 1 tablet (10 mg total) by mouth daily.   sertraline (ZOLOFT) 50 MG tablet Take 1 tablet (50 mg total) by mouth daily.   No facility-administered encounter medications on file as of 02/05/2022.     Lab Results  Component Value Date   WBC 5.0 02/05/2022   HGB 13.8 02/05/2022   HCT 41.0 02/05/2022   PLT 218.0 02/05/2022   GLUCOSE 99 01/31/2022   CHOL 212 (H) 01/31/2022   TRIG 72.0 01/31/2022   HDL 79.90 01/31/2022   LDLDIRECT 165.9 05/11/2013   LDLCALC 118 (H) 01/31/2022   ALT 26 01/31/2022   AST 35 01/31/2022   NA 141  01/31/2022   K 3.8 01/31/2022   CL 105 01/31/2022   CREATININE 0.81 01/31/2022   BUN 14 01/31/2022   CO2 26 01/31/2022   TSH 1.78 02/05/2022   INR 0.96 04/14/2016    CT ABDOMEN PELVIS W CONTRAST  Result Date: 11/07/2021 CLINICAL DATA:  Left lower quadrant tenderness for 3 weeks. EXAM: CT ABDOMEN AND PELVIS WITH CONTRAST TECHNIQUE: Multidetector CT imaging of the abdomen and pelvis was performed using the standard protocol following bolus administration of intravenous contrast. RADIATION DOSE REDUCTION: This exam was performed according to the departmental dose-optimization program which includes automated exposure control, adjustment of the mA and/or kV according to patient size and/or use of iterative reconstruction technique. CONTRAST:  36m OMNIPAQUE IOHEXOL 300 MG/ML  SOLN COMPARISON:  02/10/2020 FINDINGS: Lower chest: No acute findings. Hepatobiliary: No suspicious focal abnormality within the liver parenchyma. There is no evidence for gallstones, gallbladder wall thickening, or pericholecystic fluid. No intrahepatic or extrahepatic biliary dilation. Pancreas: No focal mass lesion. No dilatation of the main duct. No intraparenchymal cyst. No peripancreatic edema. Spleen: No splenomegaly. No focal mass lesion. Adrenals/Urinary Tract: No adrenal nodule or mass. Duplicated intrarenal collecting system noted bilaterally. Stable small bilateral renal cysts. No evidence for hydroureter. The urinary bladder appears normal for the degree of distention. Stomach/Bowel: Small hiatal hernia. Stomach is unremarkable. No gastric wall thickening. No evidence of outlet obstruction. Duodenum is normally positioned as is the ligament of Treitz. No small bowel wall thickening. No small bowel dilatation. The  terminal ileum is normal. The appendix is not well visualized, but there is no edema or inflammation in the region of the cecum. No gross colonic mass. No colonic wall thickening. Diverticular changes are noted in  the left colon without evidence of diverticulitis. Vascular/Lymphatic: There is moderate atherosclerotic calcification of the abdominal aorta without aneurysm. There is no gastrohepatic or hepatoduodenal ligament lymphadenopathy. No retroperitoneal or mesenteric lymphadenopathy. No pelvic sidewall lymphadenopathy. Reproductive: The uterus is unremarkable.  There is no adnexal mass. Other: No intraperitoneal free fluid. Musculoskeletal: No worrisome lytic or sclerotic osseous abnormality. Pelvic floor laxity evident. IMPRESSION: 1. No acute findings in the abdomen or pelvis. 2. Left colonic diverticulosis without diverticulitis. 3. Small hiatal hernia. 4. Aortic Atherosclerosis (ICD10-I70.0). Electronically Signed   By: Misty Stanley M.D.   On: 11/07/2021 08:23       Assessment & Plan:   Problem List Items Addressed This Visit     Abnormal liver function test    Abdominal ultrasound - no liver abnormality.  Recheck liver panel with next labs.       Anemia    Follow cbc.       Aortic atherosclerosis (HCC)    Continue crestor.  Low cholesterol diet and exercise.  Follow lipid panel and liver function tests.   Lab Results  Component Value Date   CHOL 212 (H) 01/31/2022   HDL 79.90 01/31/2022   LDLCALC 118 (H) 01/31/2022   LDLDIRECT 165.9 05/11/2013   TRIG 72.0 01/31/2022   CHOLHDL 3 01/31/2022       Relevant Medications   lisinopril (ZESTRIL) 10 MG tablet   rosuvastatin (CRESTOR) 10 MG tablet   Centrilobular emphysema (HCC)    Evaluated by pulmonary as outlined.   Continue trelegy.        Fatigue     Check routine labs.        Relevant Orders   CBC w/Diff (Completed)   GERD (gastroesophageal reflux disease)    No acid reflux reported.  Continues on prilosec.       Hypercholesterolemia    Discussed cholesterol labs. On crestor.  Increase crestor to '10mg'$  q day.  Follow lipid panel and liver function tests.        Relevant Medications   lisinopril (ZESTRIL) 10 MG tablet    rosuvastatin (CRESTOR) 10 MG tablet   Other Relevant Orders   Lipid Profile   Hepatic function panel   Hypertension - Primary    Continue lisinopril.  Blood pressure doing well.  Follow pressure.  Follow metabolic panel.       Relevant Medications   lisinopril (ZESTRIL) 10 MG tablet   rosuvastatin (CRESTOR) 10 MG tablet   Other Relevant Orders   Basic Metabolic Panel (BMET)   Hypothyroidism    On thyroid replacement.  Follow tsh.        Relevant Medications   levothyroxine (SYNTHROID) 75 MCG tablet   Other Relevant Orders   TSH (Completed)   Osteoporosis    Reclast.  Followed by endocrinology.         Einar Pheasant, MD

## 2022-02-05 NOTE — Patient Instructions (Signed)
Increase crestor to '10mg'$  per day

## 2022-02-10 DIAGNOSIS — M9905 Segmental and somatic dysfunction of pelvic region: Secondary | ICD-10-CM | POA: Diagnosis not present

## 2022-02-10 DIAGNOSIS — M9903 Segmental and somatic dysfunction of lumbar region: Secondary | ICD-10-CM | POA: Diagnosis not present

## 2022-02-10 DIAGNOSIS — M62830 Muscle spasm of back: Secondary | ICD-10-CM | POA: Diagnosis not present

## 2022-02-10 DIAGNOSIS — M5136 Other intervertebral disc degeneration, lumbar region: Secondary | ICD-10-CM | POA: Diagnosis not present

## 2022-02-11 ENCOUNTER — Ambulatory Visit: Payer: Medicare PPO

## 2022-02-16 ENCOUNTER — Encounter: Payer: Self-pay | Admitting: Internal Medicine

## 2022-02-16 NOTE — Assessment & Plan Note (Signed)
Continue lisinopril.  Blood pressure doing well.  Follow pressure.  Follow metabolic panel.  

## 2022-02-16 NOTE — Assessment & Plan Note (Signed)
Discussed cholesterol labs. On crestor.  Increase crestor to '10mg'$  q day.  Follow lipid panel and liver function tests.

## 2022-02-16 NOTE — Assessment & Plan Note (Signed)
Continue crestor.  Low cholesterol diet and exercise.  Follow lipid panel and liver function tests.   Lab Results  Component Value Date   CHOL 212 (H) 01/31/2022   HDL 79.90 01/31/2022   LDLCALC 118 (H) 01/31/2022   LDLDIRECT 165.9 05/11/2013   TRIG 72.0 01/31/2022   CHOLHDL 3 01/31/2022

## 2022-02-16 NOTE — Assessment & Plan Note (Signed)
No acid reflux reported.  Continues on prilosec.

## 2022-02-16 NOTE — Assessment & Plan Note (Signed)
Check routine labs.

## 2022-02-16 NOTE — Assessment & Plan Note (Signed)
Evaluated by pulmonary as outlined.   Continue trelegy.   

## 2022-02-16 NOTE — Assessment & Plan Note (Signed)
Reclast.  Followed by endocrinology.  

## 2022-02-16 NOTE — Assessment & Plan Note (Signed)
Abdominal ultrasound - no liver abnormality.  Recheck liver panel with next labs.

## 2022-02-16 NOTE — Assessment & Plan Note (Signed)
Follow cbc.  

## 2022-02-16 NOTE — Assessment & Plan Note (Signed)
On thyroid replacement.  Follow tsh.  

## 2022-02-27 ENCOUNTER — Ambulatory Visit: Payer: Medicare PPO | Attending: Gastroenterology

## 2022-02-27 DIAGNOSIS — M6281 Muscle weakness (generalized): Secondary | ICD-10-CM | POA: Insufficient documentation

## 2022-02-27 DIAGNOSIS — R278 Other lack of coordination: Secondary | ICD-10-CM | POA: Insufficient documentation

## 2022-02-27 DIAGNOSIS — M6289 Other specified disorders of muscle: Secondary | ICD-10-CM | POA: Insufficient documentation

## 2022-02-27 NOTE — Therapy (Signed)
OUTPATIENT PHYSICAL THERAPY FEMALE PELVIC TREATMENT   Patient Name: Savannah Cox MRN: [id number] DOB:April 26, 1946, 76 y.o., female Today's Date: 02/27/2022   PT End of Session - 02/27/22 1444     Visit Number 6    Number of Visits 12    Date for PT Re-Evaluation 03/31/22    Authorization Type IE: 01/06/22    PT Start Time 1445    PT Stop Time 1525    PT Time Calculation (min) 40 min    Activity Tolerance Patient tolerated treatment well             Past Medical History:  Diagnosis Date   Allergic state    Anemia    Complication of anesthesia    Diverticulosis    GERD (gastroesophageal reflux disease)    History of hiatal hernia    History of kidney stones    Hypercholesterolemia    Hypertension    Hypothyroidism    Osteoporosis, postmenopausal    PONV (postoperative nausea and vomiting)    nauseated with colonoscopy   Shingles    Past Surgical History:  Procedure Laterality Date   BREAST BIOPSY Left 90s   benign   COLONOSCOPY WITH PROPOFOL N/A 01/28/2016   Procedure: COLONOSCOPY WITH PROPOFOL;  Surgeon: Lollie Sails, MD;  Location: [facility] ENDOSCOPY;  Service: Endoscopy;  Laterality: N/A;   EXCISION METACARPAL MASS Right 04/30/2017   Procedure: EXCISION MUCOID CYST FIFTH FINGER RIGHT HAND;  Surgeon: Hessie Knows, MD;  Location: [facility] ORS;  Service: Orthopedics;  Laterality: Right;   EYE SURGERY Bilateral 2010   cataracts   Patient Active Problem List   Diagnosis Date Noted   Fatigue 02/05/2022   Leg cramps 07/14/2021   Kidney cysts 03/20/2021   Chest pain 02/14/2021   Monocytosis 02/14/2021   Abnormal liver function test 02/14/2021   Back pain 10/11/2020   Centrilobular emphysema ([place]) 02/13/2020   Former smoker 02/13/2020   Sleeping difficulties 01/22/2020   Leukopenia 09/12/2019   SOB (shortness of breath) 06/06/2019   Chest tightness 03/20/2019   Osteoporosis 07/12/2018   Bilateral hip pain 06/12/2018   Aortic atherosclerosis ([place]) 10/10/2017    Finger pain, right 07/05/2017   Acute colitis 04/17/2016   Diarrhea 04/17/2016   Hypokalemia 04/17/2016   Headache 04/17/2016   GIB (gastrointestinal bleeding) 04/14/2016   History of colonic polyps 01/31/2016   Change in bowel movement 10/21/2015   Pain of both thumbs 01/14/2015   Right hip pain 01/10/2015   Health care maintenance 09/04/2014   Abnormal mammogram 09/25/2013   Abdominal pain 02/22/2013   Anemia 05/01/2012   GERD (gastroesophageal reflux disease) 05/01/2012   Hematuria 05/01/2012   Hypothyroidism 05/01/2012   Hypercholesterolemia 05/01/2012   Hypertension 05/01/2012    PCP: Einar Pheasant, MD  REFERRING PROVIDER:  [phone] (ICD-10-CM) - Pelvic floor dysfunction   REFERRING DIAG: Ok Edwards, NP   THERAPY DIAG:  Pelvic floor dysfunction  Other lack of coordination  Muscle weakness (generalized)  Rationale for Evaluation and Treatment: Rehabilitation  ONSET DATE: 5 years ago  PRECAUTIONS: None  WEIGHT BEARING RESTRICTIONS: No  FALLS:  Has patient fallen in last 6 months? No  OCCUPATION/SOCIAL ACTIVITIES: Retired, gardening, reading, walking, Silver Sneakers once/week   CHIEF CONCERN: Pt has had a loss of bladder control and she feels it is getting worse because she is having bowel leakage as well. Bowel leakage began about a year ago. Urinary leakage began 5 years ago and she began wearing a thin pad. Urinary leakage has become worse. Physical activity, squatting, sneezing, coughing, laughing all causes urinary leakage. When Pt gardens, she feels her pad is soiled after. Pt at times feels she cannot sense her bowel movements coming out when she sits on the toilet to urinate. Pt does not have pain with bowel movements or straining but sometimes will push  so more comes out in order to not have bowel leakage. Bowel leakage is noticed more with walking/exercising or after a Silver Sneaker class. Pt has had instances of both urinary and bowel leakage when performing physical activity.   NPRS scale: 0/10 (current), 3/10 (worst) Pain location: lower back   Pain type: aching Pain description: intermittent   Aggravating factors: in the morning Relieving factors: chiropractor once a month, heat   LIVING ENVIRONMENT: Lives with: lives with their spouse Lives in: House/apartment   PATIENT GOALS: Not to have bowel leakage and not have as much urine leakage, Pt has COPD and wants to not leak as much with coughing     UROLOGICAL HISTORY Fluid intake: 2 cups of coffee in morning, water during day, 1 soft drink a day, milk   Pain with urination: No Fully empty bladder: No  Stream: constant  Urgency: No Frequency: 7-8x/day Nocturia: 1x  Leakage: Walking to the bathroom, Coughing, Sneezing, Laughing, Exercise, Lifting, and Bending forward Pads: Yes Type: Poise, Lvl 4 Amount: 3x/day Bladder control (0-10): 4/10  GASTROINTESTINAL HISTORY Pain with bowel movement: No Type of bowel movement:Type (Bristol Stool Scale) Type 1 and Type 4, Frequency 1x/day, and Strain No Frequency: 1x/day Fully empty rectum: No Leakage: Yes, mucus like but can be formed too, 5x/week Pads: Yes - wears a long one for urinary incontinence  Stool softner- every now and then    SEXUAL HISTORY/FUNCTION Pt has no concerns    OBSTETRICAL HISTORY Vaginal deliveries: G3P3 Tearing: Yes - episiotomy with first child    GYNECOLOGICAL HISTORY Hysterectomy: no Pelvic Organ Prolapse: none Heaviness/pressure: no   SUBJECTIVE:  Pt has been doing well especially since she has had no bowel leakage. Pt is still having some urinary leakage but not like it used to be.    PAIN:  Are you having pain? No    OBJECTIVE:    COGNITION: Overall cognitive status: Within  functional limits for tasks assessed     POSTURE:  Thoracic kyphosis: slight in standing Iliac crest height: L iliac crest Pelvic obliquity: WNL   SENSATION:  Light touch: intact, L2-S2 dermatomes     RANGE OF MOTION:    (Norm range in degrees)  LEFT 01/14/22 RIGHT 01/14/22  Lumbar forward flexion (65):  WNL    Lumbar extension (30): WNL    Lumbar lateral flexion (25):  WNL WNL  Thoracic and Lumbar rotation (30 degrees):    WNL WNL  Hip Flexion (0-125):   WNL WNL  Hip IR (0-45):  WNL WNL  Hip ER (0-45):  WNL WNL  Hip Adduction:      Hip Abduction (0-40):  WNL WNL  Hip extension (0-15):     (*=  pain, Blank rows = not tested)   STRENGTH: MMT   RLE 01/14/22 LLE 01/14/22  Hip Flexion 4* 4  Hip Extension 4 4  Hip Abduction     Hip Adduction     Hip ER  5 5  Hip IR  4* 5  Knee Extension 5 5  Knee Flexion 4 4  Dorsiflexion     Plantarflexion (seated) 5 5  (*= pain, Blank rows = not tested)   SPECIAL TESTS:  FABER (SN 81): negative B FADIR (SN 94): negative B   PALPATION:  Abdominal:  Diastasis: none Rib flare: present B Tension felt throughout the abdominal cavity   R gluteal musculature and piriformis with significant tension compared to L, causing discomfort upon palpation   EXTERNAL PELVIC EXAM: Patient educated on the purpose of the pelvic exam and articulated understanding; patient consented to the exam verbally.  Breath coordination: inconsistent Voluntary Contraction: present, 2/5 MMT with Valsalva Relaxation: delayed Perineal movement with sustained IAP increase ("bear down"): elevation with significant pelvic movement and Valsalva Perineal movement with rapid IAP increase ("cough"): no change (0= no contraction, 1= flicker, 2= weak squeeze, 3= fair squeeze with lift, 4= good squeeze and lift against resistance, 5= strong squeeze against strong resistance)    TODAY'S TREATMENT    Neuromuscular Re-education: FOTO Reassessment:  PFDI Bowel - IE: 77,  Today: 8 Urinary Problem - IE: 51, Today: 49 (-2)   Discussion on results and responses   Discussion on LTGs below in relation to incontinence pad use and urinary leakage.    Discussion on "just in case" trips to the bathroom and how that affects PFM coordination and bladder-brain connection. Pt verbalized understanding.   STSs, x10,  with coordinated breath for improved IAP management, cueing required   Review of [place] abdominal rehab   Supine hooklying TrA contraction with coordinated exhale Supine hooklying TrA w/marches  Cueing to not lift the leg so high.    Patient response to interventions: Pt found the breathing with squats easier to perform and she held her breath more than she thought   Patient Education:  Patient provided with HEP including: STSs with breath. Patient educated throughout session on appropriate technique and form using multi-modal cueing, HEP, and activity modification. Patient will benefit from further education in order to maximize compliance and understanding for long-term therapeutic gains.   ASSESSMENT:  Clinical Impression: Patient with excellent motivation to participate in today's session. Pt continues to demonstrate deficits in IAP management, PFM coordination, PFM strength, PFM extensibility, LE strength, posture and pain. Based on FOTO Reassessment of PFDI Bowel, Pt has demonstrated significant improvement (8) from IE (77) and has had no bowel leakage. Although FOTO Urinary Problem demonstrates -2 point change, subjective reports from Pt demonstrate improvement as Pt does not require frequent changing of incontinence pad due to being soiled (2x/day) and does not have urinary leakage with household activities/traveling/gardening. Pt required moderate VCs and TCs to decrease bodily compensations during STSs (increased chest excursion with inhale). Pt responded positively to active and educational interventions. Patient will continue to benefit from  skilled therapeutic intervention to address deficits in IAP management, PFM coordination, PFM strength, PFM extensibility, LE strength, posture and pain in order to increase PLOF and improve overall QOL.    Objective Impairments: decreased coordination, decreased endurance, decreased strength, improper body mechanics, postural dysfunction, and pain.   Activity Limitations: lifting, bending, sitting, standing, squatting, continence, toileting, and locomotion level  Personal Factors: Age, Behavior pattern, Past/current experiences, Time since  onset of injury/illness/exacerbation, and 3+ comorbidities: colitis, COPD, HTN, diverticulitis  are also affecting patient's functional outcome.   Rehab Potential: Good  Clinical Decision Making: Evolving/moderate complexity  Evaluation Complexity: Moderate   GOALS: Goals reviewed with patient? Yes  SHORT TERM GOALS: Target date: 03/31/2022  Patient will demonstrate independence with HEP in order to maximize therapeutic gains and improve carryover from physical therapy sessions to ADLs in the home and community. Baseline: toileting posture/skin protection pads handout,  Goal status: IN PROGRESS    LONG TERM GOALS: Target date: 03/31/2022  Patient will score >/= 60 on FOTO Urinary Problem  in order to demonstrate improved IAP management, improved PFM coordination, and overall improved QOL.  Baseline: 51; (9/14): 49 Goal status: IN PROGRESS  2.  Patient will report confidence in ability to control bladder > 7/10 in order to demonstrate improved function and ability to participate more fully in activities at home and in the community. Baseline: 4/10 Goal status: INITIAL  3.  Patient will report being able to return to activities including, but not limited to: gardening, walking, physical fitness classes, household chores without disruption or limitation to indicate complete resolution of the chief concern and return to prior level of  participation at home and in the community. Baseline: has leakage with all the above; (9/14): no leakage with exercise, household chores, none when outside attending to garden  Goal status: IN PROGRESS   4.  Patient will report decreased reliance on protective undergarments as indicated by a 24 hour period to demonstrate improved bladder control and allow for increased participation in activities outside of the home. Baseline: Lvl 4 incontinence Poise and 3x/day, (9/14): 1-2x/day, but only 1 is damp  Goal status: IN PROGRESS  5.  Patient will demonstrate independent and coordinated diaphragmatic breathing in supine with a 1:2 breathing pattern for improved down-regulation of the nervous system and improved management of intra-abdominal pressures in order to increase function at home and in the community. Baseline: inconsistent breath/activation of abdominals on exhale  Goal status: INITIAL  6.  Patient will report less than 5 incidents of stress urinary incontinence over the course of 3 weeks while physical activity/coughing/sneezing/laughing/prolonged activity in order to demonstrate improved PFM coordination, strength, and function for improved overall QOL. Baseline: urinary leakage with all the above, bowel leakage more with physical activity; (9/14): leakage with coughing and sneezing   Goal status: IN PROGRESS  PLAN: PT Frequency: 1x/week  PT Duration: 12 weeks  Planned Interventions: Therapeutic exercises, Therapeutic activity, Neuromuscular re-education, Balance training, Gait training, Patient/Family education, Self Care, Joint mobilization, Cryotherapy, Moist heat, scar mobilization, Taping, and Manual therapy  Plan For Next Session: how was class? Continue deep core, practice lying<>sitting with breath    Remijio Holleran, PT, DPT  02/27/2022, 2:45 PM

## 2022-02-28 ENCOUNTER — Encounter: Payer: Self-pay | Admitting: Pulmonary Disease

## 2022-02-28 ENCOUNTER — Ambulatory Visit: Payer: Medicare PPO | Admitting: Pulmonary Disease

## 2022-02-28 VITALS — BP 118/70 | HR 83 | Temp 97.5°F | Ht 63.0 in | Wt 142.6 lb

## 2022-02-28 DIAGNOSIS — R0602 Shortness of breath: Secondary | ICD-10-CM | POA: Diagnosis not present

## 2022-02-28 DIAGNOSIS — Z87891 Personal history of nicotine dependence: Secondary | ICD-10-CM | POA: Diagnosis not present

## 2022-02-28 DIAGNOSIS — J449 Chronic obstructive pulmonary disease, unspecified: Secondary | ICD-10-CM

## 2022-02-28 MED ORDER — TRELEGY ELLIPTA 100-62.5-25 MCG/ACT IN AEPB: 1.0000 | INHALATION_SPRAY | Freq: Every day | RESPIRATORY_TRACT | 11 refills | Status: DC

## 2022-02-28 NOTE — Progress Notes (Signed)
Subjective:    Patient ID: Savannah Cox, female    DOB: 09/07/1945, 76 y.o.   MRN: [id number] Patient Care Team: Einar Pheasant, MD as PCP - General (Internal Medicine)  Chief Complaint  Patient presents with   Follow-up   HPI Savannah Cox is a 76 year old former smoker who follows for the issue of COPD with chronic bronchitis.  This is a scheduled visit.  Last seen here 21 August 2021.  She continues to use Trelegy Ellipta consistently.  She notes that this medication helps her and she has not had any flareups/exacerbations of bronchitis/COPD since her last visit.  Prior to being switched to Trelegy she had been on Stiolto and she noted issues with congestion and cough and frequent exacerbations during the year.  All of these issues have now resolved on the Trelegy.  She is active at the [facility] and does treadmill workouts and can do over a mile on the treadmill without shortness of breath.She has not had any fevers, chills or sweats.  No hemoptysis.  No orthopnea, paroxysmal nocturnal dyspnea, chest pain, calf pain or tenderness.  She has had some mild postnasal drip related to fall allergies.  She takes Claritin over-the-counter for this.  No malaise. Overall she feels well and looks well.  She has not had to use her rescue inhaler at all.   She has a very remote former smoker therefore it does not qualify for lung cancer screening due to having quit cigarettes over 20 years ago.   DATA: 01/30/2020 PFTs: FEV1 1.99 L or 94% predicted, FVC 2.40 L or 85% predicted, no bronchodilator response. FEV1/FVC 83%.  Lung volumes are low end of normal.  Diffusion capacity mildly reduced 06/26/2021 PFTs: FEV1 2.09 L or 104% predicted, FVC 2.39 L or 90% predicted, FEV1/FVC 87%, no bronchodilator response.  Lung volumes at low end normal, small airways component.  Diffusion capacity mildly reduced.  Consistent with very mild COPD  Review of Systems A 10 point review of systems was performed and it is as noted  above otherwise negative.    Objective:   Physical Exam BP 118/70 (BP Location: Left Arm, Patient Position: Sitting, Cuff Size: Normal)   Pulse 83   Temp (!) 97.5 F (36.4 C) (Oral)   Ht '5\' 3"'$  (1.6 m)   Wt 142 lb 9.6 oz (64.7 kg)   SpO2 94%   BMI 25.26 kg/m  GENERAL: Well-developed, well-nourished, no acute distress.  Fully ambulatory.  Mild hoarseness and throat clearing today due to postnasal drip. HEAD: Normocephalic, atraumatic.  EYES: Pupils equal, round, reactive to light.  No scleral icterus.  MOUTH: Dentition is intact. NECK: Supple. No thyromegaly. Trachea midline. No JVD.  No adenopathy. PULMONARY: Good air entry bilaterally.  No adventitious sounds. CARDIOVASCULAR: S1 and S2. Regular rate and rhythm.  No rubs, murmurs or gallops heard. ABDOMEN: Benign. MUSCULOSKELETAL: No joint deformity, no clubbing, no edema.  NEUROLOGIC: No focal deficit, no gait disturbance, speech is fluent. SKIN: Intact,warm,dry. PSYCH: Mood and behavior normal.      Assessment & Plan:     ICD-10-CM   1. COPD with chronic bronchitis and emphysema ([place])  J44.9    Continues to be well compensated. Continue Trelegy Continue as needed albuterol Follow-up 6 months or as needed    2. SOB (shortness of breath)  R06.02    Resolved on Trelegy Continues to stay active    3. Former smoker - remote  [phone]    No evidence of relapse  Meds ordered this encounter  Medications   Fluticasone-Umeclidin-Vilant (TRELEGY ELLIPTA) 100-62.5-25 MCG/ACT AEPB    Sig: Inhale 1 puff into the lungs daily.    Dispense:  60 each    Refill:  11   We will see the patient in follow-up in 6 months time she is to contact us prior to that time should any new difficulties arise.  Patient was instructed qualifies for influenza and RSV vaccine.  She will procure these at either [facility] or [facility].  Savannah Don, MD [facility] P[facility]  Pulmonary-    *This note was dictated using  voice recognition software/Dragon.  Despite best efforts to proofread, errors can occur which can change the meaning. Any transcriptional errors that result from this process are unintentional and may not be fully corrected at the time of dictation.

## 2022-02-28 NOTE — Patient Instructions (Signed)
Your lungs sounded very good today.  We refilled your Trelegy.  We will follow-up in 6 months time call sooner should any problems arise.

## 2022-03-10 DIAGNOSIS — M62830 Muscle spasm of back: Secondary | ICD-10-CM | POA: Diagnosis not present

## 2022-03-10 DIAGNOSIS — M5136 Other intervertebral disc degeneration, lumbar region: Secondary | ICD-10-CM | POA: Diagnosis not present

## 2022-03-10 DIAGNOSIS — M9903 Segmental and somatic dysfunction of lumbar region: Secondary | ICD-10-CM | POA: Diagnosis not present

## 2022-03-10 DIAGNOSIS — M9905 Segmental and somatic dysfunction of pelvic region: Secondary | ICD-10-CM | POA: Diagnosis not present

## 2022-03-12 ENCOUNTER — Ambulatory Visit: Payer: Medicare PPO

## 2022-03-12 DIAGNOSIS — R278 Other lack of coordination: Secondary | ICD-10-CM | POA: Diagnosis not present

## 2022-03-12 DIAGNOSIS — M6289 Other specified disorders of muscle: Secondary | ICD-10-CM

## 2022-03-12 DIAGNOSIS — M6281 Muscle weakness (generalized): Secondary | ICD-10-CM | POA: Diagnosis not present

## 2022-03-12 NOTE — Therapy (Signed)
OUTPATIENT PHYSICAL THERAPY FEMALE PELVIC TREATMENT   Patient Name: Savannah Cox MRN: [id number] DOB:[birth year], 76 y.o., female Today's Date: 03/12/2022   PT End of Session - 03/12/22 1306     Visit Number 7    Number of Visits 12    Date for PT Re-Evaluation 03/31/22    Authorization Type IE: 01/06/22    PT Start Time 1315    PT Stop Time 1355    PT Time Calculation (min) 40 min    Activity Tolerance Patient tolerated treatment well             Past Medical History:  Diagnosis Date   Allergic state    Anemia    Complication of anesthesia    Diverticulosis    GERD (gastroesophageal reflux disease)    History of hiatal hernia    History of kidney stones    Hypercholesterolemia    Hypertension    Hypothyroidism    Osteoporosis, postmenopausal    PONV (postoperative nausea and vomiting)    nauseated with colonoscopy   Shingles    Past Surgical History:  Procedure Laterality Date   BREAST BIOPSY Left 90s   benign   COLONOSCOPY WITH PROPOFOL N/A 01/28/2016   Procedure: COLONOSCOPY WITH PROPOFOL;  Surgeon: Lollie Sails, MD;  Location: [facility] ENDOSCOPY;  Service: Endoscopy;  Laterality: N/A;   EXCISION METACARPAL MASS Right 04/30/2017   Procedure: EXCISION MUCOID CYST FIFTH FINGER RIGHT HAND;  Surgeon: Hessie Knows, MD;  Location: [facility] ORS;  Service: Orthopedics;  Laterality: Right;   EYE SURGERY Bilateral 2010   cataracts   Patient Active Problem List   Diagnosis Date Noted   Fatigue 02/05/2022   Leg cramps 07/14/2021   Kidney cysts 03/20/2021   Chest pain 02/14/2021   Monocytosis 02/14/2021   Abnormal liver function test 02/14/2021   Back pain 10/11/2020   Centrilobular emphysema ([place]) 02/13/2020   Former smoker 02/13/2020   Sleeping difficulties 01/22/2020   Leukopenia 09/12/2019   SOB (shortness of breath) 06/06/2019   Chest tightness 03/20/2019   Osteoporosis 07/12/2018   Bilateral hip pain 06/12/2018   Aortic atherosclerosis ([place]) 10/10/2017    Finger pain, right 07/05/2017   Acute colitis 04/17/2016   Diarrhea 04/17/2016   Hypokalemia 04/17/2016   Headache 04/17/2016   GIB (gastrointestinal bleeding) 04/14/2016   History of colonic polyps 01/31/2016   Change in bowel movement 10/21/2015   Pain of both thumbs 01/14/2015   Right hip pain 01/10/2015   Health care maintenance 09/04/2014   Abnormal mammogram 09/25/2013   Abdominal pain 02/22/2013   Anemia 05/01/2012   GERD (gastroesophageal reflux disease) 05/01/2012   Hematuria 05/01/2012   Hypothyroidism 05/01/2012   Hypercholesterolemia 05/01/2012   Hypertension 05/01/2012    PCP: Einar Pheasant, MD  REFERRING PROVIDER:  [phone] (ICD-10-CM) - Pelvic floor dysfunction   REFERRING DIAG: Ok Edwards, NP   THERAPY DIAG:  Pelvic floor dysfunction  Other lack of coordination  Muscle weakness (generalized)  Rationale for Evaluation and Treatment: Rehabilitation  ONSET DATE: 5 years ago  PRECAUTIONS: None  WEIGHT BEARING RESTRICTIONS: No  FALLS:  Has patient fallen in last 6 months? No  OCCUPATION/SOCIAL ACTIVITIES: Retired, gardening, reading, walking, Silver Sneakers once/week   CHIEF CONCERN: Pt has had a loss of bladder control and she feels it is getting worse because she is having bowel leakage as well. Bowel leakage began about a year ago. Urinary leakage began 5 years ago and she began wearing a thin pad. Urinary leakage has become worse. Physical activity, squatting, sneezing, coughing, laughing all causes urinary leakage. When Pt gardens, she feels her pad is soiled after. Pt at times feels she cannot sense her bowel movements coming out when she sits on the toilet to urinate. Pt does not have pain with bowel movements or straining but sometimes will push  so more comes out in order to not have bowel leakage. Bowel leakage is noticed more with walking/exercising or after a Silver Sneaker class. Pt has had instances of both urinary and bowel leakage when performing physical activity.   NPRS scale: 0/10 (current), 3/10 (worst) Pain location: lower back   Pain type: aching Pain description: intermittent   Aggravating factors: in the morning Relieving factors: chiropractor once a month, heat   LIVING ENVIRONMENT: Lives with: lives with their spouse Lives in: House/apartment   PATIENT GOALS: Not to have bowel leakage and not have as much urine leakage, Pt has COPD and wants to not leak as much with coughing     UROLOGICAL HISTORY Fluid intake: 2 cups of coffee in morning, water during day, 1 soft drink a day, milk   Pain with urination: No Fully empty bladder: No  Stream: constant  Urgency: No Frequency: 7-8x/day Nocturia: 1x  Leakage: Walking to the bathroom, Coughing, Sneezing, Laughing, Exercise, Lifting, and Bending forward Pads: Yes Type: Poise, Lvl 4 Amount: 3x/day Bladder control (0-10): 4/10  GASTROINTESTINAL HISTORY Pain with bowel movement: No Type of bowel movement:Type (Bristol Stool Scale) Type 1 and Type 4, Frequency 1x/day, and Strain No Frequency: 1x/day Fully empty rectum: No Leakage: Yes, mucus like but can be formed too, 5x/week Pads: Yes - wears a long one for urinary incontinence  Stool softner- every now and then    SEXUAL HISTORY/FUNCTION Pt has no concerns    OBSTETRICAL HISTORY Vaginal deliveries: G3P3 Tearing: Yes - episiotomy with first child    GYNECOLOGICAL HISTORY Hysterectomy: no Pelvic Organ Prolapse: none Heaviness/pressure: no   SUBJECTIVE:  Pt continues to participate in HEP. Pt reports using a Lvl 2 incontinence pad now but still having some trouble with her confidence as she put a pantyliner on top to make sure she doesn't leak too much.    PAIN:  Are you having pain?  No    OBJECTIVE:    COGNITION: Overall cognitive status: Within functional limits for tasks assessed     POSTURE:  Thoracic kyphosis: slight in standing Iliac crest height: L iliac crest Pelvic obliquity: WNL   SENSATION:  Light touch: intact, L2-S2 dermatomes     RANGE OF MOTION:    (Norm range in degrees)  LEFT 01/14/22 RIGHT 01/14/22  Lumbar forward flexion (65):  WNL    Lumbar extension (30): WNL    Lumbar lateral flexion (25):  WNL WNL  Thoracic and Lumbar rotation (30 degrees):    WNL WNL  Hip Flexion (0-125):   WNL WNL  Hip IR (0-45):  WNL WNL  Hip ER (0-45):  WNL WNL  Hip Adduction:      Hip Abduction (0-40):  WNL WNL  Hip extension (0-15):     (*= pain, Blank rows = not tested)   STRENGTH: MMT   RLE 01/14/22 LLE 01/14/22  Hip Flexion 4* 4  Hip Extension 4 4  Hip Abduction     Hip Adduction     Hip ER  5 5  Hip IR  4* 5  Knee Extension 5 5  Knee Flexion 4 4  Dorsiflexion     Plantarflexion (seated) 5 5  (*= pain, Blank rows = not tested)   SPECIAL TESTS:  FABER (SN 81): negative B FADIR (SN 94): negative B   PALPATION:  Abdominal:  Diastasis: none Rib flare: present B Tension felt throughout the abdominal cavity   R gluteal musculature and piriformis with significant tension compared to L, causing discomfort upon palpation   EXTERNAL PELVIC EXAM: Patient educated on the purpose of the pelvic exam and articulated understanding; patient consented to the exam verbally.  Breath coordination: inconsistent Voluntary Contraction: present, 2/5 MMT with Valsalva Relaxation: delayed Perineal movement with sustained IAP increase ("bear down"): elevation with significant pelvic movement and Valsalva Perineal movement with rapid IAP increase ("cough"): no change (0= no contraction, 1= flicker, 2= weak squeeze, 3= fair squeeze with lift, 4= good squeeze and lift against resistance, 5= strong squeeze against strong resistance)    TODAY'S TREATMENT     Neuromuscular Re-education: Supine hooklying diaphragmatic breathing with VCs and TCs for downregulation of the nervous system and improved management of IAP  Supine hooklying TrA contraction with 90/90 alternating heel taps and coordinated breath, 3x6, for improved IAP management   Supine isometric 90/90 holds with coordinated breathing, 2x30 secs, for improved IAP management   Quadruped diaphragmatic breathing with VCs and TCs for downregulation of the nervous system and improved management of IAP, significant cueing required   Practice sit<>lying transfer log roll technique with coordinated breathing to avoid Valsalva maneuver during transitional movements. Required VCs and TCs for proper technique.   Discussion on continuing to build confidence in her bladder control as Pt has been able to use Lvl 2 incontinence pads.   Patient response to interventions: Pt found the breathing with squats easier to perform and she held her breath more than she thought   Patient Education:  Patient provided with HEP including: quadruped diaphragmatic breathing, supine 90/90 holds then heel drop with TrA activation. Patient educated throughout session on appropriate technique and form using multi-modal cueing, HEP, and activity modification. Patient will benefit from further education in order to maximize compliance and understanding for long-term therapeutic gains.   ASSESSMENT:  Clinical Impression: Patient with excellent motivation to participate in today's session. Pt continues to demonstrate deficits in IAP management, PFM coordination, PFM strength, PFM extensibility, LE strength, posture and pain. Pt reports a decrease in Lvl of incontinence pads (Lvl 2) compared to IE (Lvl 4) and not requiring frequent changes. Pt required significant VCs and TCs to decrease bodily compensations during quadruped diaphragmatic breathing (increased muscular holding on inhalation). With continued practice, Pt  able to differentiate between allowing the abdomen to natural expand on inhale in quadruped. Pt required moderate cueing during progression of TrA activation. Pt responded positively to active and educational interventions. Patient will continue to benefit from skilled therapeutic intervention to address deficits in IAP management, PFM coordination, PFM strength, PFM extensibility, LE strength, posture and pain in order to increase PLOF and improve overall QOL.    Objective Impairments: decreased coordination, decreased endurance, decreased strength, improper body mechanics, postural dysfunction, and  pain.   Activity Limitations: lifting, bending, sitting, standing, squatting, continence, toileting, and locomotion level  Personal Factors: Age, Behavior pattern, Past/current experiences, Time since onset of injury/illness/exacerbation, and 3+ comorbidities: colitis, COPD, HTN, diverticulitis  are also affecting patient's functional outcome.   Rehab Potential: Good  Clinical Decision Making: Evolving/moderate complexity  Evaluation Complexity: Moderate   GOALS: Goals reviewed with patient? Yes  SHORT TERM GOALS: Target date: 03/31/2022  Patient will demonstrate independence with HEP in order to maximize therapeutic gains and improve carryover from physical therapy sessions to ADLs in the home and community. Baseline: toileting posture/skin protection pads handout,  Goal status: IN PROGRESS    LONG TERM GOALS: Target date: 03/31/2022  Patient will score >/= 60 on FOTO Urinary Problem  in order to demonstrate improved IAP management, improved PFM coordination, and overall improved QOL.  Baseline: 51; (9/14): 49 Goal status: IN PROGRESS  2.  Patient will report confidence in ability to control bladder > 7/10 in order to demonstrate improved function and ability to participate more fully in activities at home and in the community. Baseline: 4/10 Goal status: INITIAL  3.  Patient will  report being able to return to activities including, but not limited to: gardening, walking, physical fitness classes, household chores without disruption or limitation to indicate complete resolution of the chief concern and return to prior level of participation at home and in the community. Baseline: has leakage with all the above; (9/14): no leakage with exercise, household chores, none when outside attending to garden  Goal status: IN PROGRESS   4.  Patient will report decreased reliance on protective undergarments as indicated by a 24 hour period to demonstrate improved bladder control and allow for increased participation in activities outside of the home. Baseline: Lvl 4 incontinence Poise and 3x/day, (9/14): 1-2x/day, but only 1 is damp  Goal status: IN PROGRESS  5.  Patient will demonstrate independent and coordinated diaphragmatic breathing in supine with a 1:2 breathing pattern for improved down-regulation of the nervous system and improved management of intra-abdominal pressures in order to increase function at home and in the community. Baseline: inconsistent breath/activation of abdominals on exhale  Goal status: INITIAL  6.  Patient will report less than 5 incidents of stress urinary incontinence over the course of 3 weeks while physical activity/coughing/sneezing/laughing/prolonged activity in order to demonstrate improved PFM coordination, strength, and function for improved overall QOL. Baseline: urinary leakage with all the above, bowel leakage more with physical activity; (9/14): leakage with coughing and sneezing   Goal status: IN PROGRESS  PLAN: PT Frequency: 1x/week  PT Duration: 12 weeks  Planned Interventions: Therapeutic exercises, Therapeutic activity, Neuromuscular re-education, Balance training, Gait training, Patient/Family education, Self Care, Joint mobilization, Cryotherapy, Moist heat, scar mobilization, Taping, and Manual therapy  Plan For Next Session:  Continue deep core progression (standing), check hip (aligned?)   Claudis Giovanelli, PT, DPT  03/12/2022, 1:07 PM

## 2022-03-19 ENCOUNTER — Ambulatory Visit: Payer: Medicare PPO | Attending: Gastroenterology

## 2022-03-19 DIAGNOSIS — R278 Other lack of coordination: Secondary | ICD-10-CM | POA: Insufficient documentation

## 2022-03-19 DIAGNOSIS — M6281 Muscle weakness (generalized): Secondary | ICD-10-CM | POA: Diagnosis not present

## 2022-03-19 DIAGNOSIS — M6289 Other specified disorders of muscle: Secondary | ICD-10-CM | POA: Insufficient documentation

## 2022-03-19 NOTE — Therapy (Addendum)
OUTPATIENT PHYSICAL THERAPY FEMALE PELVIC TREATMENT   Patient Name: Savannah Cox MRN: [id number] DOB:07/03/1945, 76 y.o., female Today's Date: 03/19/2022   PT End of Session - 03/19/22 1058     Visit Number 8    Number of Visits 12    Date for PT Re-Evaluation 03/31/22    Authorization Type IE: 01/06/22    PT Start Time 1100    PT Stop Time 1140    PT Time Calculation (min) 40 min    Activity Tolerance Patient tolerated treatment well             Past Medical History:  Diagnosis Date   Allergic state    Anemia    Complication of anesthesia    Diverticulosis    GERD (gastroesophageal reflux disease)    History of hiatal hernia    History of kidney stones    Hypercholesterolemia    Hypertension    Hypothyroidism    Osteoporosis, postmenopausal    PONV (postoperative nausea and vomiting)    nauseated with colonoscopy   Shingles    Past Surgical History:  Procedure Laterality Date   BREAST BIOPSY Left 90s   benign   COLONOSCOPY WITH PROPOFOL N/A 01/28/2016   Procedure: COLONOSCOPY WITH PROPOFOL;  Surgeon: Lollie Sails, MD;  Location: [facility] ENDOSCOPY;  Service: Endoscopy;  Laterality: N/A;   EXCISION METACARPAL MASS Right 04/30/2017   Procedure: EXCISION MUCOID CYST FIFTH FINGER RIGHT HAND;  Surgeon: Hessie Knows, MD;  Location: [facility] ORS;  Service: Orthopedics;  Laterality: Right;   EYE SURGERY Bilateral 2010   cataracts   Patient Active Problem List   Diagnosis Date Noted   Fatigue 02/05/2022   Leg cramps 07/14/2021   Kidney cysts 03/20/2021   Chest pain 02/14/2021   Monocytosis 02/14/2021   Abnormal liver function test 02/14/2021   Back pain 10/11/2020   Centrilobular emphysema ([place]) 02/13/2020   Former smoker 02/13/2020   Sleeping difficulties 01/22/2020   Leukopenia 09/12/2019   SOB (shortness of breath) 06/06/2019   Chest tightness 03/20/2019   Osteoporosis 07/12/2018   Bilateral hip pain 06/12/2018   Aortic atherosclerosis ([place]) 10/10/2017    Finger pain, right 07/05/2017   Acute colitis 04/17/2016   Diarrhea 04/17/2016   Hypokalemia 04/17/2016   Headache 04/17/2016   GIB (gastrointestinal bleeding) 04/14/2016   History of colonic polyps 01/31/2016   Change in bowel movement 10/21/2015   Pain of both thumbs 01/14/2015   Right hip pain 01/10/2015   Health care maintenance 09/04/2014   Abnormal mammogram 09/25/2013   Abdominal pain 02/22/2013   Anemia 05/01/2012   GERD (gastroesophageal reflux disease) 05/01/2012   Hematuria 05/01/2012   Hypothyroidism 05/01/2012   Hypercholesterolemia 05/01/2012   Hypertension 05/01/2012    PCP: Einar Pheasant, MD  REFERRING PROVIDER:  [id number] (ICD-10-CM) - Pelvic floor dysfunction   REFERRING DIAG: Ok Edwards, NP   THERAPY DIAG:  Pelvic floor dysfunction  Other lack of coordination  Muscle weakness (generalized)  Rationale for Evaluation and Treatment: Rehabilitation  ONSET DATE: 5 years ago  PRECAUTIONS: None  WEIGHT BEARING RESTRICTIONS: No  FALLS:  Has patient fallen in last 6 months? No  OCCUPATION/SOCIAL ACTIVITIES: Retired, gardening, reading, walking, Silver [employer]neakers once/week   CHIEF CONCERN: Pt has had a loss of bladder control and she feels it is getting worse because she is having bowel leakage as well. Bowel leakage began about a year ago. Urinary leakage began 5 years ago and she began wearing a thin pad. Urinary leakage has become worse. Physical activity, squatting, sneezing, coughing, laughing all causes urinary leakage. When Pt gardens, she feels her pad is soiled after. Pt at times feels she cannot sense her bowel movements coming out when she sits on the toilet to urinate. Pt does not have pain with bowel movements or straining but sometimes will push  so more comes out in order to not have bowel leakage. Bowel leakage is noticed more with walking/exercising or after a Silver Sneaker class. Pt has had instances of both urinary and bowel leakage when performing physical activity.   NPRS scale: 0/10 (current), 3/10 (worst) Pain location: lower back   Pain type: aching Pain description: intermittent   Aggravating factors: in the morning Relieving factors: chiropractor once a month, heat   LIVING ENVIRONMENT: Lives with: lives with their spouse Lives in: House/apartment   PATIENT GOALS: Not to have bowel leakage and not have as much urine leakage, Pt has COPD and wants to not leak as much with coughing     UROLOGICAL HISTORY Fluid intake: 2 cups of coffee in morning, water during day, 1 soft drink a day, milk   Pain with urination: No Fully empty bladder: No  Stream: constant  Urgency: No Frequency: 7-8x/day Nocturia: 1x  Leakage: Walking to the bathroom, Coughing, Sneezing, Laughing, Exercise, Lifting, and Bending forward Pads: Yes Type: Poise, Lvl 4 Amount: 3x/day Bladder control (0-10): 4/10  GASTROINTESTINAL HISTORY Pain with bowel movement: No Type of bowel movement:Type (Bristol Stool Scale) Type 1 and Type 4, Frequency 1x/day, and Strain No Frequency: 1x/day Fully empty rectum: No Leakage: Yes, mucus like but can be formed too, 5x/week Pads: Yes - wears a long one for urinary incontinence  Stool softner- every now and then    SEXUAL HISTORY/FUNCTION Pt has no concerns    OBSTETRICAL HISTORY Vaginal deliveries: G3P3 Tearing: Yes - episiotomy with first child    GYNECOLOGICAL HISTORY Hysterectomy: no Pelvic Organ Prolapse: none Heaviness/pressure: no   SUBJECTIVE:  Pt continues to do well. Pt has been able to use just the Lvl 2 incontinence pads without any leakage.    PAIN:  Are you having pain? No    OBJECTIVE:    COGNITION: Overall cognitive status: Within functional limits for tasks  assessed     POSTURE:  Thoracic kyphosis: slight in standing Iliac crest height: L iliac crest Pelvic obliquity: WNL   SENSATION:  Light touch: intact, L2-S2 dermatomes     RANGE OF MOTION:    (Norm range in degrees)  LEFT 01/14/22 RIGHT 01/14/22  Lumbar forward flexion (65):  WNL    Lumbar extension (30): WNL    Lumbar lateral flexion (25):  WNL WNL  Thoracic and Lumbar rotation (30 degrees):    WNL WNL  Hip Flexion (0-125):   WNL WNL  Hip IR (0-45):  WNL WNL  Hip ER (0-45):  WNL WNL  Hip Adduction:      Hip Abduction (0-40):  WNL WNL  Hip extension (0-15):     (*= pain, Blank rows = not tested)  STRENGTH: MMT   RLE 01/14/22 LLE 01/14/22  Hip Flexion 4* 4  Hip Extension 4 4  Hip Abduction     Hip Adduction     Hip ER  5 5  Hip IR  4* 5  Knee Extension 5 5  Knee Flexion 4 4  Dorsiflexion     Plantarflexion (seated) 5 5  (*= pain, Blank rows = not tested)   SPECIAL TESTS:  FABER (SN 81): negative B FADIR (SN 94): negative B   PALPATION:  Abdominal:  Diastasis: none Rib flare: present B Tension felt throughout the abdominal cavity   R gluteal musculature and piriformis with significant tension compared to L, causing discomfort upon palpation   EXTERNAL PELVIC EXAM: Patient educated on the purpose of the pelvic exam and articulated understanding; patient consented to the exam verbally.  Breath coordination: inconsistent Voluntary Contraction: present, 2/5 MMT with Valsalva Relaxation: delayed Perineal movement with sustained IAP increase ("bear down"): elevation with significant pelvic movement and Valsalva Perineal movement with rapid IAP increase ("cough"): no change (0= no contraction, 1= flicker, 2= weak squeeze, 3= fair squeeze with lift, 4= good squeeze and lift against resistance, 5= strong squeeze against strong resistance)    TODAY'S TREATMENT   Neuromuscular Re-education: Discussion on urinary urgency control with using techniques such as  diaphragmatic breathing, seated heel raises, and distraction techniques.   Discussion on bladder irritants such as caffeinated beverages and acidic foods. Pt verbalized understanding.   Supine hooklying diaphragmatic breathing with VCs and TCs for downregulation of the nervous system and improved management of IAP  Review: Supine hooklying TrA contraction with 90/90 alternating heel taps and coordinated breath, 3x6, for improved IAP management  Supine isometric 90/90 holds with coordinated breathing, 2x30 secs, for improved IAP management   Body mechanics for transitional movements in and out of the car as Pt experiences urinary leakage with transfers. Technique of bringing bottom back into the seat first then move BLE.   Wall squats, x10, with coordinated breath for improved IAP management. VCs and TCs required.    Patient response to interventions: Pt liked the modification on how to get in and out the car    Patient Education:  Patient provided with HEP including: wall squats with breath. Patient educated throughout session on appropriate technique and form using multi-modal cueing, HEP, and activity modification. Patient will benefit from further education in order to maximize compliance and understanding for long-term therapeutic gains.   ASSESSMENT:  Clinical Impression: Patient with excellent motivation to participate in today's session. Pt continues to demonstrate deficits in IAP management, PFM coordination, PFM strength, PFM extensibility, LE strength, posture and pain. Pt reports improved urinary incontinence over the past week but noticed some with transitional movements particularly entering/exiting a car. Time spent to discuss how to get inside a car especially with higher height and using her breath to guide her. Pt required moderate VCs and TCs to decrease bodily compensations during TrA progression in standing. Pt responded positively to active and educational  interventions. Patient will continue to benefit from skilled therapeutic intervention to address deficits in IAP management, PFM coordination, PFM strength, PFM extensibility, LE strength, posture and pain in order to increase PLOF and improve overall QOL.    Objective Impairments: decreased coordination, decreased endurance, decreased strength, improper body mechanics, postural dysfunction, and pain.   Activity Limitations: lifting, bending, sitting, standing, squatting, continence, toileting, and locomotion level  Personal Factors: Age, Behavior pattern, Past/current experiences, Time since onset of injury/illness/exacerbation, and 3+  comorbidities: colitis, COPD, HTN, diverticulitis  are also affecting patient's functional outcome.   Rehab Potential: Good  Clinical Decision Making: Evolving/moderate complexity  Evaluation Complexity: Moderate   GOALS: Goals reviewed with patient? Yes  SHORT TERM GOALS: Target date: 03/31/2022  Patient will demonstrate independence with HEP in order to maximize therapeutic gains and improve carryover from physical therapy sessions to ADLs in the home and community. Baseline: toileting posture/skin protection pads handout,  Goal status: IN PROGRESS    LONG TERM GOALS: Target date: 03/31/2022  Patient will score >/= 60 on FOTO Urinary Problem  in order to demonstrate improved IAP management, improved PFM coordination, and overall improved QOL.  Baseline: 51; (9/14): 49 Goal status: IN PROGRESS  2.  Patient will report confidence in ability to control bladder > 7/10 in order to demonstrate improved function and ability to participate more fully in activities at home and in the community. Baseline: 4/10 Goal status: INITIAL  3.  Patient will report being able to return to activities including, but not limited to: gardening, walking, physical fitness classes, household chores without disruption or limitation to indicate complete resolution of the  chief concern and return to prior level of participation at home and in the community. Baseline: has leakage with all the above; (9/14): no leakage with exercise, household chores, none when outside attending to garden  Goal status: IN PROGRESS   4.  Patient will report decreased reliance on protective undergarments as indicated by a 24 hour period to demonstrate improved bladder control and allow for increased participation in activities outside of the home. Baseline: Lvl 4 incontinence Poise and 3x/day, (9/14): 1-2x/day, but only 1 is damp  Goal status: IN PROGRESS  5.  Patient will demonstrate independent and coordinated diaphragmatic breathing in supine with a 1:2 breathing pattern for improved down-regulation of the nervous system and improved management of intra-abdominal pressures in order to increase function at home and in the community. Baseline: inconsistent breath/activation of abdominals on exhale  Goal status: INITIAL  6.  Patient will report less than 5 incidents of stress urinary incontinence over the course of 3 weeks while physical activity/coughing/sneezing/laughing/prolonged activity in order to demonstrate improved PFM coordination, strength, and function for improved overall QOL. Baseline: urinary leakage with all the above, bowel leakage more with physical activity; (9/14): leakage with coughing and sneezing   Goal status: IN PROGRESS  PLAN: PT Frequency: 1x/week  PT Duration: 12 weeks  Planned Interventions: Therapeutic exercises, Therapeutic activity, Neuromuscular re-education, Balance training, Gait training, Patient/Family education, Self Care, Joint mobilization, Cryotherapy, Moist heat, scar mobilization, Taping, and Manual therapy  Plan For Next Session: Continue deep core progression (standing), check hip (aligned?), how did the car go?    [employer], PT, DPT  03/19/2022, 12:39 PM

## 2022-03-26 ENCOUNTER — Ambulatory Visit: Payer: Medicare PPO

## 2022-03-26 DIAGNOSIS — M6289 Other specified disorders of muscle: Secondary | ICD-10-CM

## 2022-03-26 DIAGNOSIS — R278 Other lack of coordination: Secondary | ICD-10-CM | POA: Diagnosis not present

## 2022-03-26 DIAGNOSIS — M6281 Muscle weakness (generalized): Secondary | ICD-10-CM | POA: Diagnosis not present

## 2022-03-26 NOTE — Therapy (Signed)
OUTPATIENT PHYSICAL THERAPY FEMALE PELVIC TREATMENT   Patient Name: Tianni Escamilla MRN: [id number] DOB:17-May-1946, 76 y.o., female Today's Date: 03/26/2022   PT End of Session - 03/26/22 1018     Visit Number 9    Number of Visits 12    Date for PT Re-Evaluation 03/31/22    Authorization Type IE: 01/06/22    PT Start Time 1020    PT Stop Time 1100    PT Time Calculation (min) 40 min    Activity Tolerance Patient tolerated treatment well             Past Medical History:  Diagnosis Date   Allergic state    Anemia    Complication of anesthesia    Diverticulosis    GERD (gastroesophageal reflux disease)    History of hiatal hernia    History of kidney stones    Hypercholesterolemia    Hypertension    Hypothyroidism    Osteoporosis, postmenopausal    PONV (postoperative nausea and vomiting)    nauseated with colonoscopy   Shingles    Past Surgical History:  Procedure Laterality Date   BREAST BIOPSY Left 90s   benign   COLONOSCOPY WITH PROPOFOL N/A 01/28/2016   Procedure: COLONOSCOPY WITH PROPOFOL;  Surgeon: Lollie Sails, MD;  Location: [facility] ENDOSCOPY;  Service: Endoscopy;  Laterality: N/A;   EXCISION METACARPAL MASS Right 04/30/2017   Procedure: EXCISION MUCOID CYST FIFTH FINGER RIGHT HAND;  Surgeon: Hessie Knows, MD;  Location: [facility] ORS;  Service: Orthopedics;  Laterality: Right;   EYE SURGERY Bilateral 2010   cataracts   Patient Active Problem List   Diagnosis Date Noted   Fatigue 02/05/2022   Leg cramps 07/14/2021   Kidney cysts 03/20/2021   Chest pain 02/14/2021   Monocytosis 02/14/2021   Abnormal liver function test 02/14/2021   Back pain 10/11/2020   Centrilobular emphysema ([place]) 02/13/2020   Former smoker 02/13/2020   Sleeping difficulties 01/22/2020   Leukopenia 09/12/2019   SOB (shortness of breath) 06/06/2019   Chest tightness 03/20/2019   Osteoporosis 07/12/2018   Bilateral hip pain 06/12/2018   Aortic atherosclerosis ([place]) 10/10/2017    Finger pain, right 07/05/2017   Acute colitis 04/17/2016   Diarrhea 04/17/2016   Hypokalemia 04/17/2016   Headache 04/17/2016   GIB (gastrointestinal bleeding) 04/14/2016   History of colonic polyps 01/31/2016   Change in bowel movement 10/21/2015   Pain of both thumbs 01/14/2015   Right hip pain 01/10/2015   Health care maintenance 09/04/2014   Abnormal mammogram 09/25/2013   Abdominal pain 02/22/2013   Anemia 05/01/2012   GERD (gastroesophageal reflux disease) 05/01/2012   Hematuria 05/01/2012   Hypothyroidism 05/01/2012   Hypercholesterolemia 05/01/2012   Hypertension 05/01/2012    PCP: Einar Pheasant, MD  REFERRING PROVIDER:  [phone] (ICD-10-CM) - Pelvic floor dysfunction   REFERRING DIAG: Ok Edwards, NP   THERAPY DIAG:  Pelvic floor dysfunction  Other lack of coordination  Muscle weakness (generalized)  Rationale for Evaluation and Treatment: Rehabilitation  ONSET DATE: 5 years ago  PRECAUTIONS: None  WEIGHT BEARING RESTRICTIONS: No  FALLS:  Has patient fallen in last 6 months? No  OCCUPATION/SOCIAL ACTIVITIES: Retired, gardening, reading, walking, Silver Sneakers once/week   CHIEF CONCERN: Pt has had a loss of bladder control and she feels it is getting worse because she is having bowel leakage as well. Bowel leakage began about a year ago. Urinary leakage began 5 years ago and she began wearing a thin pad. Urinary leakage has become worse. Physical activity, squatting, sneezing, coughing, laughing all causes urinary leakage. When Pt gardens, she feels her pad is soiled after. Pt at times feels she cannot sense her bowel movements coming out when she sits on the toilet to urinate. Pt does not have pain with bowel movements or straining but sometimes will push  so more comes out in order to not have bowel leakage. Bowel leakage is noticed more with walking/exercising or after a Silver Sneaker class. Pt has had instances of both urinary and bowel leakage when performing physical activity.   NPRS scale: 0/10 (current), 3/10 (worst) Pain location: lower back   Pain type: aching Pain description: intermittent   Aggravating factors: in the morning Relieving factors: chiropractor once a month, heat   LIVING ENVIRONMENT: Lives with: lives with their spouse Lives in: House/apartment   PATIENT GOALS: Not to have bowel leakage and not have as much urine leakage, Pt has COPD and wants to not leak as much with coughing     UROLOGICAL HISTORY Fluid intake: 2 cups of coffee in morning, water during day, 1 soft drink a day, milk   Pain with urination: No Fully empty bladder: No  Stream: constant  Urgency: No Frequency: 7-8x/day Nocturia: 1x  Leakage: Walking to the bathroom, Coughing, Sneezing, Laughing, Exercise, Lifting, and Bending forward Pads: Yes Type: Poise, Lvl 4 Amount: 3x/day Bladder control (0-10): 4/10  GASTROINTESTINAL HISTORY Pain with bowel movement: No Type of bowel movement:Type (Bristol Stool Scale) Type 1 and Type 4, Frequency 1x/day, and Strain No Frequency: 1x/day Fully empty rectum: No Leakage: Yes, mucus like but can be formed too, 5x/week Pads: Yes - wears a long one for urinary incontinence  Stool softner- every now and then    SEXUAL HISTORY/FUNCTION Pt has no concerns    OBSTETRICAL HISTORY Vaginal deliveries: G3P3 Tearing: Yes - episiotomy with first child    GYNECOLOGICAL HISTORY Hysterectomy: no Pelvic Organ Prolapse: none Heaviness/pressure: no   SUBJECTIVE:  Pt is doing well and did not have much urinary leakage especially as she went on vacation. Pt was able to participate in Silver Sneakers without any leakage.    PAIN:  Are you having pain? No    OBJECTIVE:    COGNITION: Overall  cognitive status: Within functional limits for tasks assessed     POSTURE:  Thoracic kyphosis: slight in standing Iliac crest height: L iliac crest Pelvic obliquity: WNL   SENSATION:  Light touch: intact, L2-S2 dermatomes     RANGE OF MOTION:    (Norm range in degrees)  LEFT 01/14/22 RIGHT 01/14/22  Lumbar forward flexion (65):  WNL    Lumbar extension (30): WNL    Lumbar lateral flexion (25):  WNL WNL  Thoracic and Lumbar rotation (30 degrees):    WNL WNL  Hip Flexion (0-125):   WNL WNL  Hip IR (0-45):  WNL WNL  Hip ER (0-45):  WNL WNL  Hip Adduction:      Hip Abduction (0-40):  WNL WNL  Hip extension (0-15):     (*=  pain, Blank rows = not tested)   STRENGTH: MMT   RLE 01/14/22 LLE 01/14/22  Hip Flexion 4* 4  Hip Extension 4 4  Hip Abduction     Hip Adduction     Hip ER  5 5  Hip IR  4* 5  Knee Extension 5 5  Knee Flexion 4 4  Dorsiflexion     Plantarflexion (seated) 5 5  (*= pain, Blank rows = not tested)   SPECIAL TESTS:  FABER (SN 81): negative B FADIR (SN 94): negative B   PALPATION:  Abdominal:  Diastasis: none Rib flare: present B Tension felt throughout the abdominal cavity   R gluteal musculature and piriformis with significant tension compared to L, causing discomfort upon palpation   EXTERNAL PELVIC EXAM: Patient educated on the purpose of the pelvic exam and articulated understanding; patient consented to the exam verbally.  Breath coordination: inconsistent Voluntary Contraction: present, 2/5 MMT with Valsalva Relaxation: delayed Perineal movement with sustained IAP increase ("bear down"): elevation with significant pelvic movement and Valsalva Perineal movement with rapid IAP increase ("cough"): no change (0= no contraction, 1= flicker, 2= weak squeeze, 3= fair squeeze with lift, 4= good squeeze and lift against resistance, 5= strong squeeze against strong resistance)    TODAY'S TREATMENT   Neuromuscular Re-education: Discussion on  possibility of reducing Level of incontinence pad as Pt reports very minimal leakage on the Lvl 2 pad. Pt verbalized understanding.   Supine hooklying diaphragmatic breathing with VCs and TCs for downregulation of the nervous system and improved management of IAP  Dead bug hold with coordinated breathing for improved IAP management, 3x20", VCs and TCs required   Dead bug with UE/LE movement and coordinated breathing for improved IAP management, 3x5, VCs and TCs required  Discussion on what to expect next visit (progress note) and re-assessment of posture and PFM externally.   Standing wall planks, 3 x30", with coordinated breathing for improved IAP management, VCs and TCs required   Patient response to interventions: Pt comfortable to transition to every other week    Patient Education:  Patient provided with HEP including: dead bug holds/dead bug, standing wall planks. Patient educated throughout session on appropriate technique and form using multi-modal cueing, HEP, and activity modification. Patient will benefit from further education in order to maximize compliance and understanding for long-term therapeutic gains.   ASSESSMENT:  Clinical Impression: Patient with excellent motivation to participate in today's session. Pt continues to demonstrate deficits in IAP management, PFM coordination, PFM strength, PFM extensibility, LE strength, posture and pain. Pt reports improved urinary incontinence over the past week especially as Pt was out of town. Pt required moderate VCs and TCs to decrease bodily compensations during TrA progression in various positions. Pt with some R posterior leg "ache" during dead bug but relieved with modifications. Pt responded positively to active and educational interventions. Patient will continue to benefit from skilled therapeutic intervention to address deficits in IAP management, PFM coordination, PFM strength, PFM extensibility, LE strength, posture and pain  in order to increase PLOF and improve overall QOL.    Objective Impairments: decreased coordination, decreased endurance, decreased strength, improper body mechanics, postural dysfunction, and pain.   Activity Limitations: lifting, bending, sitting, standing, squatting, continence, toileting, and locomotion level  Personal Factors: Age, Behavior pattern, Past/current experiences, Time since onset of injury/illness/exacerbation, and 3+ comorbidities: colitis, COPD, HTN, diverticulitis  are also affecting patient's functional outcome.   Rehab Potential: Good  Clinical Decision Making: Evolving/moderate complexity  Evaluation Complexity:  Moderate   GOALS: Goals reviewed with patient? Yes  SHORT TERM GOALS: Target date: 03/31/2022  Patient will demonstrate independence with HEP in order to maximize therapeutic gains and improve carryover from physical therapy sessions to ADLs in the home and community. Baseline: toileting posture/skin protection pads handout,  Goal status: IN PROGRESS    LONG TERM GOALS: Target date: 03/31/2022  Patient will score >/= 60 on FOTO Urinary Problem  in order to demonstrate improved IAP management, improved PFM coordination, and overall improved QOL.  Baseline: 51; (9/14): 49 Goal status: IN PROGRESS  2.  Patient will report confidence in ability to control bladder > 7/10 in order to demonstrate improved function and ability to participate more fully in activities at home and in the community. Baseline: 4/10 Goal status: INITIAL  3.  Patient will report being able to return to activities including, but not limited to: gardening, walking, physical fitness classes, household chores without disruption or limitation to indicate complete resolution of the chief concern and return to prior level of participation at home and in the community. Baseline: has leakage with all the above; (9/14): no leakage with exercise, household chores, none when outside  attending to garden  Goal status: IN PROGRESS   4.  Patient will report decreased reliance on protective undergarments as indicated by a 24 hour period to demonstrate improved bladder control and allow for increased participation in activities outside of the home. Baseline: Lvl 4 incontinence Poise and 3x/day, (9/14): 1-2x/day, but only 1 is damp  Goal status: IN PROGRESS  5.  Patient will demonstrate independent and coordinated diaphragmatic breathing in supine with a 1:2 breathing pattern for improved down-regulation of the nervous system and improved management of intra-abdominal pressures in order to increase function at home and in the community. Baseline: inconsistent breath/activation of abdominals on exhale  Goal status: INITIAL  6.  Patient will report less than 5 incidents of stress urinary incontinence over the course of 3 weeks while physical activity/coughing/sneezing/laughing/prolonged activity in order to demonstrate improved PFM coordination, strength, and function for improved overall QOL. Baseline: urinary leakage with all the above, bowel leakage more with physical activity; (9/14): leakage with coughing and sneezing   Goal status: IN PROGRESS  PLAN: PT Frequency: 1x/week  PT Duration: 12 weeks  Planned Interventions: Therapeutic exercises, Therapeutic activity, Neuromuscular re-education, Balance training, Gait training, Patient/Family education, Self Care, Joint mobilization, Cryotherapy, Moist heat, scar mobilization, Taping, and Manual therapy  Plan For Next Session: progress note (check hip, PFM external)   Yvonna Brun, PT, DPT  03/26/2022, 10:19 AM

## 2022-04-02 ENCOUNTER — Ambulatory Visit: Payer: Medicare PPO

## 2022-04-03 ENCOUNTER — Ambulatory Visit: Payer: Medicare PPO | Admitting: Dermatology

## 2022-04-03 DIAGNOSIS — L578 Other skin changes due to chronic exposure to nonionizing radiation: Secondary | ICD-10-CM

## 2022-04-03 DIAGNOSIS — D229 Melanocytic nevi, unspecified: Secondary | ICD-10-CM

## 2022-04-03 DIAGNOSIS — L814 Other melanin hyperpigmentation: Secondary | ICD-10-CM | POA: Diagnosis not present

## 2022-04-03 DIAGNOSIS — L821 Other seborrheic keratosis: Secondary | ICD-10-CM | POA: Diagnosis not present

## 2022-04-03 DIAGNOSIS — Z1283 Encounter for screening for malignant neoplasm of skin: Secondary | ICD-10-CM | POA: Diagnosis not present

## 2022-04-03 NOTE — Patient Instructions (Signed)
Recommend taking Heliocare sun protection supplement daily in sunny weather for additional sun protection. For maximum protection on the sunniest days, you can take up to 2 capsules of regular Heliocare OR take 1 capsule of Heliocare Ultra. For prolonged exposure (such as a full day in the sun), you can repeat your dose of the supplement 4 hours after your first dose. Heliocare can be purchased at [facility], at some [facility] or at [email or web address].    Melanoma ABCDEs  Melanoma is the most dangerous type of skin cancer, and is the leading cause of death from skin disease.  You are more likely to develop melanoma if you: Have light-colored skin, light-colored eyes, or red or blond hair Spend a lot of time in the sun Tan regularly, either outdoors or in a tanning bed Have had blistering sunburns, especially during childhood Have a close family member who has had a melanoma Have atypical moles or large birthmarks  Early detection of melanoma is key since treatment is typically straightforward and cure rates are extremely high if we catch it early.   The first sign of melanoma is often a change in a mole or a new dark spot.  The ABCDE system is a way of remembering the signs of melanoma.  A for asymmetry:  The two halves do not match. B for border:  The edges of the growth are irregular. C for color:  A mixture of colors are present instead of an even brown color. D for diameter:  Melanomas are usually (but not always) greater than 6mm - the size of a pencil eraser. E for evolution:  The spot keeps changing in size, shape, and color.  Please check your skin once per month between visits. You can use a small mirror in front and a large mirror behind you to keep an eye on the back side or your body.   If you see any new or changing lesions before your next follow-up, please call to schedule a visit.  Please continue daily skin protection including broad spectrum sunscreen SPF 30+ to  sun-exposed areas, reapplying every 2 hours as needed when you're outdoors.    Due to recent changes in healthcare laws, you may see results of your pathology and/or laboratory studies on MyChart before the doctors have had a chance to review them. We understand that in some cases there may be results that are confusing or concerning to you. Please understand that not all results are received at the same time and often the doctors may need to interpret multiple results in order to provide you with the best plan of care or course of treatment. Therefore, we ask that you please give us 2 business days to thoroughly review all your results before contacting the office for clarification. Should we see a critical lab result, you will be contacted sooner.   If You Need Anything After Your Visit  If you have any questions or concerns for your doctor, please call our main line at [phone] and press option 4 to reach your doctor's medical assistant. If no one answers, please leave a voicemail as directed and we will return your call as soon as possible. Messages left after 4 pm will be answered the following business day.   You may also send us a message via MyChart. We typically respond to MyChart messages within 1-2 business days.  For prescription refills, please ask your pharmacy to contact our office. Our fax number is [phone].  If you have   an urgent issue when the clinic is closed that cannot wait until the next business day, you can page your doctor at the number below.    Please note that while we do our best to be available for urgent issues outside of office hours, we are not available 24/7.   If you have an urgent issue and are unable to reach us, you may choose to seek medical care at your doctor's office, retail clinic, urgent care center, or emergency room.  If you have a medical emergency, please immediately call 911 or go to the emergency department.  Pager Numbers  - Dr.  Kowalski: [phone]  - Dr. Moye: [phone]  - Dr. Stewart: [phone]  In the event of inclement weather, please call our main line at [phone] for an update on the status of any delays or closures.  Dermatology Medication Tips: Please keep the boxes that topical medications come in in order to help keep track of the instructions about where and how to use these. Pharmacies typically print the medication instructions only on the boxes and not directly on the medication tubes.   If your medication is too expensive, please contact our office at [phone] option 4 or send us a message through MyChart.   We are unable to tell what your co-pay for medications will be in advance as this is different depending on your insurance coverage. However, we may be able to find a substitute medication at lower cost or fill out paperwork to get insurance to cover a needed medication.   If a prior authorization is required to get your medication covered by your insurance company, please allow us 1-2 business days to complete this process.  Drug prices often vary depending on where the prescription is filled and some pharmacies may offer cheaper prices.  The website www.goodrx.com contains coupons for medications through different pharmacies. The prices here do not account for what the cost may be with help from insurance (it may be cheaper with your insurance), but the website can give you the price if you did not use any insurance.  - You can print the associated coupon and take it with your prescription to the pharmacy.  - You may also stop by our office during regular business hours and pick up a GoodRx coupon card.  - If you need your prescription sent electronically to a different pharmacy, notify our office through [facility] MyChart or by phone at [phone] option 4.     Si Usted Necesita Algo Despus de Su Visita  Tambin puede enviarnos un mensaje a travs de MyChart. Por lo  general respondemos a los mensajes de MyChart en el transcurso de 1 a 2 das hbiles.  Para renovar recetas, por favor pida a su farmacia que se ponga en contacto con nuestra oficina. Nuestro nmero de fax es el [phone].  Si tiene un asunto urgente cuando la clnica est cerrada y que no puede esperar hasta el siguiente da hbil, puede llamar/localizar a su doctor(a) al nmero que aparece a continuacin.   Por favor, tenga en cuenta que aunque hacemos todo lo posible para estar disponibles para asuntos urgentes fuera del horario de oficina, no estamos disponibles las 24 horas del da, los 7 das de la semana.   Si tiene un problema urgente y no puede comunicarse con nosotros, puede optar por buscar atencin mdica  en el consultorio de su doctor(a), en una clnica privada, en un centro de atencin urgente o en una sala de   emergencias.  Si tiene una emergencia mdica, por favor llame inmediatamente al 911 o vaya a la sala de emergencias.  Nmeros de bper  - Dr. Kowalski: [phone]  - Dra. Moye: [phone]  - Dra. Stewart: [phone]  En caso de inclemencias del tiempo, por favor llame a nuestra lnea principal al [phone] para una actualizacin sobre el estado de cualquier retraso o cierre.  Consejos para la medicacin en dermatologa: Por favor, guarde las cajas en las que vienen los medicamentos de uso tpico para ayudarle a seguir las instrucciones sobre dnde y cmo usarlos. Las farmacias generalmente imprimen las instrucciones del medicamento slo en las cajas y no directamente en los tubos del medicamento.   Si su medicamento es muy caro, por favor, pngase en contacto con nuestra oficina llamando al [phone] y presione la opcin 4 o envenos un mensaje a travs de MyChart.   No podemos decirle cul ser su copago por los medicamentos por adelantado ya que esto es diferente dependiendo de la cobertura de su seguro. Sin embargo, es posible que podamos encontrar un  medicamento sustituto a menor costo o llenar un formulario para que el seguro cubra el medicamento que se considera necesario.   Si se requiere una autorizacin previa para que su compaa de seguros cubra su medicamento, por favor permtanos de 1 a 2 das hbiles para completar este proceso.  Los precios de los medicamentos varan con frecuencia dependiendo del lugar de dnde se surte la receta y alguna farmacias pueden ofrecer precios ms baratos.  El sitio web www.goodrx.com tiene cupones para medicamentos de diferentes farmacias. Los precios aqu no tienen en cuenta lo que podra costar con la ayuda del seguro (puede ser ms barato con su seguro), pero el sitio web puede darle el precio si no utiliz ningn seguro.  - Puede imprimir el cupn correspondiente y llevarlo con su receta a la farmacia.  - Tambin puede pasar por nuestra oficina durante el horario de atencin regular y recoger una tarjeta de cupones de GoodRx.  - Si necesita que su receta se enve electrnicamente a una farmacia diferente, informe a nuestra oficina a travs de MyChart de [place] o por telfono llamando al [phone] y presione la opcin 4.  

## 2022-04-03 NOTE — Progress Notes (Signed)
   Follow-Up Visit   Subjective  Savannah Cox is a 76 y.o. female who presents for the following: Annual Exam (No personal or fhx skin cancer. ).  The patient presents for Total-Body Skin Exam (TBSE) for skin cancer screening and mole check.  The patient has spots, moles and lesions to be evaluated, some may be new or changing and the patient has concerns that these could be cancer.   The following portions of the chart were reviewed this encounter and updated as appropriate:   Tobacco  Allergies  Meds  Problems  Med Hx  Surg Hx  Fam Hx      Review of Systems:  No other skin or systemic complaints except as noted in HPI or Assessment and Plan.  Objective  Well appearing patient in no apparent distress; mood and affect are within normal limits.  A full examination was performed including scalp, head, eyes, ears, nose, lips, neck, chest, axillae, abdomen, back, buttocks, bilateral upper extremities, bilateral lower extremities, hands, feet, fingers, toes, fingernails, and toenails. All findings within normal limits unless otherwise noted below.    Assessment & Plan   Lentigines - Scattered tan macules - Due to sun exposure - Benign-appearing, observe - Recommend daily broad spectrum sunscreen SPF 30+ to sun-exposed areas, reapply every 2 hours as needed. - Call for any changes  Seborrheic Keratoses - Stuck-on, waxy, tan-brown papules and/or plaques  - Benign-appearing - Discussed benign etiology and prognosis. - Observe - Call for any changes  Melanocytic Nevi - Tan-brown and/or pink-flesh-colored symmetric macules and papules - Benign appearing on exam today - Observation - Call clinic for new or changing moles - Recommend daily use of broad spectrum spf 30+ sunscreen to sun-exposed areas.   Hemangiomas - Red papules - Discussed benign nature - Observe - Call for any changes  Actinic Damage - Chronic condition, secondary to cumulative UV/sun exposure - diffuse  scaly erythematous macules with underlying dyspigmentation - Recommend daily broad spectrum sunscreen SPF 30+ to sun-exposed areas, reapply every 2 hours as needed.  - Staying in the shade or wearing long sleeves, sun glasses (UVA+UVB protection) and wide brim hats (4-inch brim around the entire circumference of the hat) are also recommended for sun protection.  - Call for new or changing lesions.  Skin cancer screening performed today.  Return in about 1 year (around 04/04/2023) for TBSE.  Graciella Belton, RMA, am acting as scribe for Forest Gleason, MD .  Documentation: I have reviewed the above documentation for accuracy and completeness, and I agree with the above.  Forest Gleason, MD

## 2022-04-07 DIAGNOSIS — M9903 Segmental and somatic dysfunction of lumbar region: Secondary | ICD-10-CM | POA: Diagnosis not present

## 2022-04-07 DIAGNOSIS — M5136 Other intervertebral disc degeneration, lumbar region: Secondary | ICD-10-CM | POA: Diagnosis not present

## 2022-04-07 DIAGNOSIS — M9905 Segmental and somatic dysfunction of pelvic region: Secondary | ICD-10-CM | POA: Diagnosis not present

## 2022-04-07 DIAGNOSIS — M62830 Muscle spasm of back: Secondary | ICD-10-CM | POA: Diagnosis not present

## 2022-04-09 ENCOUNTER — Ambulatory Visit: Payer: Medicare PPO

## 2022-04-09 ENCOUNTER — Other Ambulatory Visit: Payer: Self-pay | Admitting: Family

## 2022-04-09 DIAGNOSIS — M6289 Other specified disorders of muscle: Secondary | ICD-10-CM | POA: Diagnosis not present

## 2022-04-09 DIAGNOSIS — M6281 Muscle weakness (generalized): Secondary | ICD-10-CM | POA: Diagnosis not present

## 2022-04-09 DIAGNOSIS — R278 Other lack of coordination: Secondary | ICD-10-CM

## 2022-04-09 NOTE — Therapy (Addendum)
OUTPATIENT PHYSICAL THERAPY FEMALE PELVIC RE-CERT/PROGRESS NOTE  REPORTING PERIODS OF 01/06/22 - 04/09/22   Patient Name: Savannah Cox MRN: [id number] DOB:1946/02/06, 76 y.o., female Today's Date: 04/09/2022   PT End of Session - 04/09/22 1056     Visit Number 10    Number of Visits 20    Date for PT Re-Evaluation 06/18/22    Authorization Type IE: 01/06/22; PN/RC: 04/09/22    PT Start Time 1100    PT Stop Time 1140    PT Time Calculation (min) 40 min    Activity Tolerance Patient tolerated treatment well             Past Medical History:  Diagnosis Date   Allergic state    Anemia    Complication of anesthesia    Diverticulosis    GERD (gastroesophageal reflux disease)    History of hiatal hernia    History of kidney stones    Hypercholesterolemia    Hypertension    Hypothyroidism    Osteoporosis, postmenopausal    PONV (postoperative nausea and vomiting)    nauseated with colonoscopy   Shingles    Past Surgical History:  Procedure Laterality Date   BREAST BIOPSY Left 90s   benign   COLONOSCOPY WITH PROPOFOL N/A 01/28/2016   Procedure: COLONOSCOPY WITH PROPOFOL;  Surgeon: Lollie Sails, MD;  Location: [facility] ENDOSCOPY;  Service: Endoscopy;  Laterality: N/A;   EXCISION METACARPAL MASS Right 04/30/2017   Procedure: EXCISION MUCOID CYST FIFTH FINGER RIGHT HAND;  Surgeon: Hessie Knows, MD;  Location: [facility] ORS;  Service: Orthopedics;  Laterality: Right;   EYE SURGERY Bilateral 2010   cataracts   Patient Active Problem List   Diagnosis Date Noted   Fatigue 02/05/2022   Leg cramps 07/14/2021   Kidney cysts 03/20/2021   Chest pain 02/14/2021   Monocytosis 02/14/2021   Abnormal liver function test 02/14/2021   Back pain 10/11/2020   Centrilobular emphysema ([place]) 02/13/2020   Former smoker 02/13/2020   Sleeping difficulties 01/22/2020   Leukopenia 09/12/2019   SOB (shortness of breath) 06/06/2019   Chest tightness 03/20/2019   Osteoporosis 07/12/2018    Bilateral hip pain 06/12/2018   Aortic atherosclerosis ([place]) 10/10/2017   Finger pain, right 07/05/2017   Acute colitis 04/17/2016   Diarrhea 04/17/2016   Hypokalemia 04/17/2016   Headache 04/17/2016   GIB (gastrointestinal bleeding) 04/14/2016   History of colonic polyps 01/31/2016   Change in bowel movement 10/21/2015   Pain of both thumbs 01/14/2015   Right hip pain 01/10/2015   Health care maintenance 09/04/2014   Abnormal mammogram 09/25/2013   Abdominal pain 02/22/2013   Anemia 05/01/2012   GERD (gastroesophageal reflux disease) 05/01/2012   Hematuria 05/01/2012   Hypothyroidism 05/01/2012   Hypercholesterolemia 05/01/2012   Hypertension 05/01/2012    PCP: Einar Pheasant, MD  REFERRING PROVIDER:  [phone] (ICD-10-CM) - Pelvic floor dysfunction   REFERRING DIAG: Ok Edwards, NP   THERAPY DIAG:  Pelvic floor dysfunction  Other lack of coordination  Muscle weakness (generalized)  Rationale for Evaluation and Treatment: Rehabilitation  ONSET DATE: 5 years ago  PRECAUTIONS: None  WEIGHT BEARING RESTRICTIONS: No  FALLS:  Has patient fallen in last 6 months? No  OCCUPATION/SOCIAL ACTIVITIES: Retired, gardening, reading, walking, Silver Sneakers once/week   CHIEF CONCERN: Pt has had a loss of bladder control and she feels it is getting worse because she is having bowel leakage as well. Bowel leakage began about a year ago. Urinary leakage began 5 years ago and she began wearing a thin pad. Urinary leakage has become worse. Physical activity, squatting, sneezing, coughing, laughing all causes urinary leakage. When Pt gardens, she feels her pad is soiled after. Pt at times feels she cannot sense her bowel movements coming out when she sits on the toilet to urinate. Pt does  not have pain with bowel movements or straining but sometimes will push so more comes out in order to not have bowel leakage. Bowel leakage is noticed more with walking/exercising or after a Silver Sneaker class. Pt has had instances of both urinary and bowel leakage when performing physical activity.   NPRS scale: 0/10 (current), 3/10 (worst) Pain location: lower back   Pain type: aching Pain description: intermittent   Aggravating factors: in the morning Relieving factors: chiropractor once a month, heat   LIVING ENVIRONMENT: Lives with: lives with their spouse Lives in: House/apartment   PATIENT GOALS: Not to have bowel leakage and not have as much urine leakage, Pt has COPD and wants to not leak as much with coughing     UROLOGICAL HISTORY Fluid intake: 2 cups of coffee in morning, water during day, 1 soft drink a day, milk   Pain with urination: No Fully empty bladder: No  Stream: constant  Urgency: No Frequency: 7-8x/day Nocturia: 1x  Leakage: Walking to the bathroom, Coughing, Sneezing, Laughing, Exercise, Lifting, and Bending forward Pads: Yes Type: Poise, Lvl 4 Amount: 3x/day Bladder control (0-10): 4/10  GASTROINTESTINAL HISTORY Pain with bowel movement: No Type of bowel movement:Type (Bristol Stool Scale) Type 1 and Type 4, Frequency 1x/day, and Strain No Frequency: 1x/day Fully empty rectum: No Leakage: Yes, mucus like but can be formed too, 5x/week Pads: Yes - wears a long one for urinary incontinence  Stool softner- every now and then    SEXUAL HISTORY/FUNCTION Pt has no concerns    OBSTETRICAL HISTORY Vaginal deliveries: G3P3 Tearing: Yes - episiotomy with first child    GYNECOLOGICAL HISTORY Hysterectomy: no Pelvic Organ Prolapse: none Heaviness/pressure: no   SUBJECTIVE:  Pt has been doing well. Pt has felt more in control of her urinary leakage.    PAIN:  Are you having pain? No    OBJECTIVE:    COGNITION: Overall cognitive  status: Within functional limits for tasks assessed     POSTURE:  Thoracic kyphosis: slight in standing Iliac crest height: L iliac crest Pelvic obliquity: WNL   SENSATION:  Light touch: intact, L2-S2 dermatomes     RANGE OF MOTION:    (Norm range in degrees)  LEFT 01/14/22 RIGHT 01/14/22  Lumbar forward flexion (65):  WNL    Lumbar extension (30): WNL    Lumbar lateral flexion (25):  WNL WNL  Thoracic and Lumbar rotation (30 degrees):    WNL WNL  Hip Flexion (0-125):   WNL WNL  Hip IR (0-45):  WNL WNL  Hip ER (0-45):  WNL WNL  Hip Adduction:      Hip Abduction (0-40):  WNL WNL  Hip extension (0-15):     (*= pain, Blank rows = not tested)   STRENGTH: MMT  RLE 01/14/22 LLE 01/14/22  Hip Flexion 4* 4  Hip Extension 4 4  Hip Abduction     Hip Adduction     Hip ER  5 5  Hip IR  4* 5  Knee Extension 5 5  Knee Flexion 4 4  Dorsiflexion     Plantarflexion (seated) 5 5  (*= pain, Blank rows = not tested)   SPECIAL TESTS:  FABER (SN 81): negative B FADIR (SN 94): negative B   PALPATION:  Abdominal:  Diastasis: none Rib flare: present B Tension felt throughout the abdominal cavity   R gluteal musculature and piriformis with significant tension compared to L, causing discomfort upon palpation   EXTERNAL PELVIC EXAM: Patient educated on the purpose of the pelvic exam and articulated understanding; patient consented to the exam verbally.  Breath coordination: inconsistent Voluntary Contraction: present, 2/5 MMT with Valsalva Relaxation: delayed Perineal movement with sustained IAP increase ("bear down"): elevation with significant pelvic movement and Valsalva Perineal movement with rapid IAP increase ("cough"): no change (0= no contraction, 1= flicker, 2= weak squeeze, 3= fair squeeze with lift, 4= good squeeze and lift against resistance, 5= strong squeeze against strong resistance)    TODAY'S TREATMENT   Neuromuscular Re-education: Reassessment of FOTO: Urinary  Problem - 9/14 (49), Today (51)  Discussion on FOTO responses as Pt felt she thought too much about the questions  Pt is having 1x per week bowel leakage, and urinary leakage when walking to the bathroom after several hours of retaining and has leakage  Pt is not having urinary leakage with physical activity (no difficulty)  MET 4/7 goals    EXTERNAL PELVIC EXAM: Patient educated on the purpose of the pelvic exam and articulated understanding; patient consented to the exam verbally.  Breath coordination: present - (IE: inconsistent) Voluntary Contraction: present, 2/5 MMT with Valsalva -same as IE Relaxation: full (IE: delayed) Perineal movement with sustained IAP increase ("bear down"): descent (IE: elevation with significant pelvic movement and Valsalva) Perineal movement with rapid IAP increase ("cough"): no change - same as IE (0= no contraction, 1= flicker, 2= weak squeeze, 3= fair squeeze with lift, 4= good squeeze and lift against resistance, 5= strong squeeze against strong resistance)   Patient educated on pressure management strategy ("the knack") with movements against gravity for improved IAP management and decreased urinary incontinence. Patient demonstrated, x3, and verbalized understanding. Cueing to decrease movement seen at upper traps and abdominals.    Patient response to interventions: Pt comfortable to continue with PFPT   Patient Education:  Patient provided with HEP including: "the knack" technique. Patient educated throughout session on appropriate technique and form using multi-modal cueing, HEP, and activity modification. Patient will benefit from further education in order to maximize compliance and understanding for long-term therapeutic gains.   ASSESSMENT:  Clinical Impression: Patient with excellent motivation to participate in today's reassessment/progress note. Pt has demonstrated significant improvement from IE on 01/06/22 in regards to IAP management,  PFM coordination, PFM extensibility, and pain. Based on FOTO responses, Pt has improved significantly with bowel leakage with now only having an incident up to 1x/week but very minimal. Upon further discussion, Pt feels she may hold bowel/bladder more than she should and ignores signals given. FOTO Urinary Problem continues to be at a 51 but based on STGs/LTGs below the intake does not reflect the improvement. Pt has increased confidence in controlling her bladder (8/10) compared to IE (4/10) and no urinary or bowel leakage with physical activity, walking, gardening, and prolonged  activity. In relation to the PFM, Pt with the most significant difference as Pt now has consistent breath coordination, full relaxation of PFM, and able to descend PFM with sustained IAP increase "bear down" with minimal Valsalva. Pt is now able to wear Lvl 2 incontinence pads (from moderate Lvl 4) and changing up to 2x/day (damp but not soaked like previously). Pt has met 4/7 goals created below. Although Pt has demonstrated improvement, Pt will continue to benefit from skilled therapeutic intervention to address remaining deficits in IAP management, PFM coordination, PFM strength, LE strength, and posture in order to return to PLOF and improve overall QOL.    Objective Impairments: decreased coordination, decreased endurance, decreased strength, improper body mechanics, postural dysfunction, and pain.   Activity Limitations: lifting, bending, sitting, standing, squatting, continence, toileting, and locomotion level  Personal Factors: Age, Behavior pattern, Past/current experiences, Time since onset of injury/illness/exacerbation, and 3+ comorbidities: colitis, COPD, HTN, diverticulitis  are also affecting patient's functional outcome.   Rehab Potential: Good  Clinical Decision Making: Evolving/moderate complexity  Evaluation Complexity: Moderate   GOALS: Goals reviewed with patient? Yes  SHORT TERM GOALS: Target date:  03/31/2022  Patient will demonstrate independence with HEP in order to maximize therapeutic gains and improve carryover from physical therapy sessions to ADLs in the home and community. Baseline: toileting posture/skin protection pads handout; (10/25): IND with HEP Goal status: MET    LONG TERM GOALS: Target date: 03/31/2022  Patient will score >/= 60 on FOTO Urinary Problem  in order to demonstrate improved IAP management, improved PFM coordination, and overall improved QOL.  Baseline: 51; (9/14): 49 Goal status: IN PROGRESS  2.  Patient will report confidence in ability to control bladder > 7/10 in order to demonstrate improved function and ability to participate more fully in activities at home and in the community. Baseline: 4/10; (10/24): 8/10 Goal status: MET  3.  Patient will report being able to return to activities including, but not limited to: gardening, walking, physical fitness classes, household chores without disruption or limitation to indicate complete resolution of the chief concern and return to prior level of participation at home and in the community. Baseline: has leakage with all the above; (9/14): no leakage with exercise, household chores, none when outside attending to garden; (10/24): no leakage with any of the above  Goal status: MET   4.  Patient will report decreased reliance on protective undergarments as indicated by a 24 hour period to demonstrate improved bladder control and allow for increased participation in activities outside of the home. Baseline: Lvl 4 incontinence Poise and 3x/day, (9/14): 1-2x/day, but only 1 is damp; (10/24): Lvl 2 very light, 2x due to being damp  Goal status: IN PROGRESS  5.  Patient will demonstrate independent and coordinated diaphragmatic breathing in supine with a 1:2 breathing pattern for improved down-regulation of the nervous system and improved management of intra-abdominal pressures in order to increase function at home  and in the community. Baseline: inconsistent breath/activation of abdominals on exhale; (10/25): coordinated breathing and improvement without chest excursion  Goal status: MET  6.  Patient will report less than 5 incidents of stress urinary incontinence over the course of 3 weeks while physical activity/coughing/sneezing/laughing/prolonged activity in order to demonstrate improved PFM coordination, strength, and function for improved overall QOL. Baseline: urinary leakage with all the above, bowel leakage more with physical activity; (9/14): leakage with coughing and sneezing; (10/25): more urinary leakage with walking to the bathroom  Goal status: IN PROGRESS  PLAN: PT Frequency: 1x/week  PT Duration: 12 weeks  Planned Interventions: Therapeutic exercises, Therapeutic activity, Neuromuscular re-education, Balance training, Gait training, Patient/Family education, Self Care, Joint mobilization, Cryotherapy, Moist heat, scar mobilization, Taping, and Manual therapy  Plan For Next Session: check hip, continue deep core in standing, bridging, PFM strength    Dillen Belmontes, PT, DPT  04/09/2022, 12:24 PM

## 2022-04-10 ENCOUNTER — Encounter: Payer: Self-pay | Admitting: Dermatology

## 2022-04-23 ENCOUNTER — Ambulatory Visit: Payer: Medicare PPO | Attending: Gastroenterology

## 2022-04-23 DIAGNOSIS — M6289 Other specified disorders of muscle: Secondary | ICD-10-CM | POA: Diagnosis not present

## 2022-04-23 DIAGNOSIS — R278 Other lack of coordination: Secondary | ICD-10-CM | POA: Insufficient documentation

## 2022-04-23 DIAGNOSIS — M6281 Muscle weakness (generalized): Secondary | ICD-10-CM | POA: Diagnosis not present

## 2022-04-23 NOTE — Therapy (Signed)
OUTPATIENT PHYSICAL THERAPY FEMALE PELVIC TREATMENT   Patient Name: Savannah Cox MRN: [id number] DOB:1946/01/02, 76 y.o., female Today's Date: 04/23/2022   PT End of Session - 04/23/22 1105     Visit Number 11    Number of Visits 20    Date for PT Re-Evaluation 06/18/22    Authorization Type IE: 01/06/22; PN/RC: 04/09/22    PT Start Time 1103    PT Stop Time 1141    PT Time Calculation (min) 38 min    Activity Tolerance Patient tolerated treatment well             Past Medical History:  Diagnosis Date   Allergic state    Anemia    Complication of anesthesia    Diverticulosis    GERD (gastroesophageal reflux disease)    History of hiatal hernia    History of kidney stones    Hypercholesterolemia    Hypertension    Hypothyroidism    Osteoporosis, postmenopausal    PONV (postoperative nausea and vomiting)    nauseated with colonoscopy   Shingles    Past Surgical History:  Procedure Laterality Date   BREAST BIOPSY Left 90s   benign   COLONOSCOPY WITH PROPOFOL N/A 01/28/2016   Procedure: COLONOSCOPY WITH PROPOFOL;  Surgeon: Lollie Sails, MD;  Location: [facility] ENDOSCOPY;  Service: Endoscopy;  Laterality: N/A;   EXCISION METACARPAL MASS Right 04/30/2017   Procedure: EXCISION MUCOID CYST FIFTH FINGER RIGHT HAND;  Surgeon: Hessie Knows, MD;  Location: [facility] ORS;  Service: Orthopedics;  Laterality: Right;   EYE SURGERY Bilateral 2010   cataracts   Patient Active Problem List   Diagnosis Date Noted   Fatigue 02/05/2022   Leg cramps 07/14/2021   Kidney cysts 03/20/2021   Chest pain 02/14/2021   Monocytosis 02/14/2021   Abnormal liver function test 02/14/2021   Back pain 10/11/2020   Centrilobular emphysema ([place]) 02/13/2020   Former smoker 02/13/2020   Sleeping difficulties 01/22/2020   Leukopenia 09/12/2019   SOB (shortness of breath) 06/06/2019   Chest tightness 03/20/2019   Osteoporosis 07/12/2018   Bilateral hip pain 06/12/2018   Aortic atherosclerosis  ([place]) 10/10/2017   Finger pain, right 07/05/2017   Acute colitis 04/17/2016   Diarrhea 04/17/2016   Hypokalemia 04/17/2016   Headache 04/17/2016   GIB (gastrointestinal bleeding) 04/14/2016   History of colonic polyps 01/31/2016   Change in bowel movement 10/21/2015   Pain of both thumbs 01/14/2015   Right hip pain 01/10/2015   Health care maintenance 09/04/2014   Abnormal mammogram 09/25/2013   Abdominal pain 02/22/2013   Anemia 05/01/2012   GERD (gastroesophageal reflux disease) 05/01/2012   Hematuria 05/01/2012   Hypothyroidism 05/01/2012   Hypercholesterolemia 05/01/2012   Hypertension 05/01/2012    PCP: Einar Pheasant, MD  REFERRING PROVIDER:  [phone] (ICD-10-CM) - Pelvic floor dysfunction   REFERRING DIAG: Ok Edwards, NP   THERAPY DIAG:  Pelvic floor dysfunction  Other lack of coordination  Muscle weakness (generalized)  Rationale for Evaluation and Treatment: Rehabilitation  ONSET DATE: 5 years ago  PRECAUTIONS: None  WEIGHT BEARING RESTRICTIONS: No  FALLS:  Has patient fallen in last 6 months? No  OCCUPATION/SOCIAL ACTIVITIES: Retired, gardening, reading, walking, Silver [employer]neakers once/week   CHIEF CONCERN: Pt has had a loss of bladder control and she feels it is getting worse because she is having bowel leakage as well. Bowel leakage began about a year ago. Urinary leakage began 5 years ago and she began wearing a thin pad. Urinary leakage has become worse. Physical activity, squatting, sneezing, coughing, laughing all causes urinary leakage. When Pt gardens, she feels her pad is soiled after. Pt at times feels she cannot sense her bowel movements coming out when she sits on the toilet to urinate. Pt does not have pain with bowel movements or straining but  sometimes will push so more comes out in order to not have bowel leakage. Bowel leakage is noticed more with walking/exercising or after a Silver Sneaker class. Pt has had instances of both urinary and bowel leakage when performing physical activity.   NPRS scale: 0/10 (current), 3/10 (worst) Pain location: lower back   Pain type: aching Pain description: intermittent   Aggravating factors: in the morning Relieving factors: chiropractor once a month, heat   LIVING ENVIRONMENT: Lives with: lives with their spouse Lives in: House/apartment   PATIENT GOALS: Not to have bowel leakage and not have as much urine leakage, Pt has COPD and wants to not leak as much with coughing     UROLOGICAL HISTORY Fluid intake: 2 cups of coffee in morning, water during day, 1 soft drink a day, milk   Pain with urination: No Fully empty bladder: No  Stream: constant  Urgency: No Frequency: 7-8x/day Nocturia: 1x  Leakage: Walking to the bathroom, Coughing, Sneezing, Laughing, Exercise, Lifting, and Bending forward Pads: Yes Type: Poise, Lvl 4 Amount: 3x/day Bladder control (0-10): 4/10  GASTROINTESTINAL HISTORY Pain with bowel movement: No Type of bowel movement:Type (Bristol Stool Scale) Type 1 and Type 4, Frequency 1x/day, and Strain No Frequency: 1x/day Fully empty rectum: No Leakage: Yes, mucus like but can be formed too, 5x/week Pads: Yes - wears a long one for urinary incontinence  Stool softner- every now and then    SEXUAL HISTORY/FUNCTION Pt has no concerns    OBSTETRICAL HISTORY Vaginal deliveries: G3P3 Tearing: Yes - episiotomy with first child    GYNECOLOGICAL HISTORY Hysterectomy: no Pelvic Organ Prolapse: none Heaviness/pressure: no   SUBJECTIVE:  Pt is doing well and has only noticed dribbling of urinary leakage. Pt has been able to practice "the knack" when coughing or sneezing and that has helped a lot as Pt has not leaked urine.    PAIN:  Are you having pain?  No    OBJECTIVE:    COGNITION: Overall cognitive status: Within functional limits for tasks assessed     POSTURE:  Thoracic kyphosis: slight in standing Iliac crest height: L iliac crest Pelvic obliquity: WNL   SENSATION:  Light touch: intact, L2-S2 dermatomes     RANGE OF MOTION:    (Norm range in degrees)  LEFT 01/14/22 RIGHT 01/14/22  Lumbar forward flexion (65):  WNL    Lumbar extension (30): WNL    Lumbar lateral flexion (25):  WNL WNL  Thoracic and Lumbar rotation (30 degrees):    WNL WNL  Hip Flexion (0-125):   WNL WNL  Hip IR (0-45):  WNL WNL  Hip ER (0-45):  WNL WNL  Hip Adduction:      Hip Abduction (0-40):  WNL WNL  Hip extension (0-15):     (*= pain, Blank rows = not tested)   STRENGTH: MMT   RLE 01/14/22 LLE 01/14/22  Hip Flexion 4* 4  Hip Extension 4 4  Hip Abduction     Hip Adduction     Hip ER  5 5  Hip IR  4* 5  Knee Extension 5 5  Knee Flexion 4 4  Dorsiflexion     Plantarflexion (seated) 5 5  (*= pain, Blank rows = not tested)   SPECIAL TESTS:  FABER (SN 81): negative B FADIR (SN 94): negative B   PALPATION:  Abdominal:  Diastasis: none Rib flare: present B Tension felt throughout the abdominal cavity   R gluteal musculature and piriformis with significant tension compared to L, causing discomfort upon palpation   EXTERNAL PELVIC EXAM: Patient educated on the purpose of the pelvic exam and articulated understanding; patient consented to the exam verbally.  Breath coordination: inconsistent Voluntary Contraction: present, 2/5 MMT with Valsalva Relaxation: delayed Perineal movement with sustained IAP increase ("bear down"): elevation with significant pelvic movement and Valsalva Perineal movement with rapid IAP increase ("cough"): no change (0= no contraction, 1= flicker, 2= weak squeeze, 3= fair squeeze with lift, 4= good squeeze and lift against resistance, 5= strong squeeze against strong resistance)    TODAY'S TREATMENT    Neuromuscular Re-education: Discussion on being more aware of when dribbling episodes happen as that will be able to guide DPT on future activities to focus on (whether with gardening, bending, lifting, or walking to the bathroom). Pt verbalized understanding.   Standing pallof press with coordinated breathing, GTB, for improved IAP management and postural stability, Vcs and Tcs required   Supine bridges with coordinated breathing for improved IAP management and posterior chain activation (gluteals), significant cueing required for proper techniques and coordination (decrease Valsalva maneuver)   Supine PFM strengthening for improved IAP management:  Pt able to demonstrate 2 quick flicks with consistent 2/5 MMT and no bodily compensation Given HEP with emphasis on not increasing repetitions until strength and coordination improves    Patient response to interventions: Pt able to feel more on the R side when performing pallof press   Patient Education:  Patient provided with HEP including: pallof press, bridging, and PFM strength. Patient educated throughout session on appropriate technique and form using multi-modal cueing, HEP, and activity modification. Patient will benefit from further education in order to maximize compliance and understanding for long-term therapeutic gains.   ASSESSMENT:  Clinical Impression: Patient with excellent motivation to participate in today's session. Pt continues to demonstrate deficits in IAP management, PFM coordination, PFM strength, LE strength, and posture. Pt has noticed improved urinary leakage with coughing/sneezing and believes "the knack" technique has helped. Pt required moderate VCs and TCs with continuing to improve IAP management and postural stability in various positions. With bridging, Pt required increased cueing for proper technique and decrease Valsalva maneuver. After increased time, Pt able to demonstrate understanding. Pt responded  positively to active and educational interventions. Pt will continue to benefit from skilled therapeutic intervention to address remaining deficits in IAP management, PFM coordination, PFM strength, LE strength, and posture in order to return to PLOF and improve overall QOL.    Objective Impairments: decreased coordination, decreased endurance, decreased strength, improper body mechanics, postural dysfunction, and pain.   Activity Limitations: lifting, bending, sitting, standing, squatting, continence, toileting, and locomotion level  Personal Factors: Age, Behavior pattern, Past/current experiences, Time since onset of injury/illness/exacerbation, and 3+ comorbidities: colitis,  COPD, HTN, diverticulitis  are also affecting patient's functional outcome.   Rehab Potential: Good  Clinical Decision Making: Evolving/moderate complexity  Evaluation Complexity: Moderate   GOALS: Goals reviewed with patient? Yes  SHORT TERM GOALS: Target date: 03/31/2022  Patient will demonstrate independence with HEP in order to maximize therapeutic gains and improve carryover from physical therapy sessions to ADLs in the home and community. Baseline: toileting posture/skin protection pads handout; (10/25): IND with HEP Goal status: MET    LONG TERM GOALS: Target date: 03/31/2022  Patient will score >/= 60 on FOTO Urinary Problem  in order to demonstrate improved IAP management, improved PFM coordination, and overall improved QOL.  Baseline: 51; (9/14): 49 Goal status: IN PROGRESS  2.  Patient will report confidence in ability to control bladder > 7/10 in order to demonstrate improved function and ability to participate more fully in activities at home and in the community. Baseline: 4/10; (10/24): 8/10 Goal status: MET  3.  Patient will report being able to return to activities including, but not limited to: gardening, walking, physical fitness classes, household chores without disruption or  limitation to indicate complete resolution of the chief concern and return to prior level of participation at home and in the community. Baseline: has leakage with all the above; (9/14): no leakage with exercise, household chores, none when outside attending to garden; (10/24): no leakage with any of the above  Goal status: MET   4.  Patient will report decreased reliance on protective undergarments as indicated by a 24 hour period to demonstrate improved bladder control and allow for increased participation in activities outside of the home. Baseline: Lvl 4 incontinence Poise and 3x/day, (9/14): 1-2x/day, but only 1 is damp; (10/24): Lvl 2 very light, 2x due to being damp  Goal status: IN PROGRESS  5.  Patient will demonstrate independent and coordinated diaphragmatic breathing in supine with a 1:2 breathing pattern for improved down-regulation of the nervous system and improved management of intra-abdominal pressures in order to increase function at home and in the community. Baseline: inconsistent breath/activation of abdominals on exhale; (10/25): coordinated breathing and improvement without chest excursion  Goal status: MET  6.  Patient will report less than 5 incidents of stress urinary incontinence over the course of 3 weeks while physical activity/coughing/sneezing/laughing/prolonged activity in order to demonstrate improved PFM coordination, strength, and function for improved overall QOL. Baseline: urinary leakage with all the above, bowel leakage more with physical activity; (9/14): leakage with coughing and sneezing; (10/25): more urinary leakage with walking to the bathroom  Goal status: IN PROGRESS  PLAN: PT Frequency: 1x/week  PT Duration: 12 weeks  Planned Interventions: Therapeutic exercises, Therapeutic activity, Neuromuscular re-education, Balance training, Gait training, Patient/Family education, Self Care, Joint mobilization, Cryotherapy, Moist heat, scar mobilization,  Taping, and Manual therapy  Plan For Next Session:  check hip, continue deep core in standing dead bug on wall, how was PFM strength    Laray Corbit, PT, DPT  04/23/2022, 11:52 AM

## 2022-05-05 DIAGNOSIS — M9903 Segmental and somatic dysfunction of lumbar region: Secondary | ICD-10-CM | POA: Diagnosis not present

## 2022-05-05 DIAGNOSIS — M9905 Segmental and somatic dysfunction of pelvic region: Secondary | ICD-10-CM | POA: Diagnosis not present

## 2022-05-05 DIAGNOSIS — M62830 Muscle spasm of back: Secondary | ICD-10-CM | POA: Diagnosis not present

## 2022-05-05 DIAGNOSIS — M5136 Other intervertebral disc degeneration, lumbar region: Secondary | ICD-10-CM | POA: Diagnosis not present

## 2022-05-07 ENCOUNTER — Ambulatory Visit: Payer: Medicare PPO

## 2022-05-07 DIAGNOSIS — M6289 Other specified disorders of muscle: Secondary | ICD-10-CM

## 2022-05-07 DIAGNOSIS — M6281 Muscle weakness (generalized): Secondary | ICD-10-CM

## 2022-05-07 DIAGNOSIS — R278 Other lack of coordination: Secondary | ICD-10-CM | POA: Diagnosis not present

## 2022-05-07 NOTE — Therapy (Signed)
OUTPATIENT PHYSICAL THERAPY FEMALE PELVIC TREATMENT   Patient Name: Savannah Cox MRN: [id number] DOB:08-May-1946, 76 y.o., female Today's Date: 05/07/2022   PT End of Session - 05/07/22 1301     Visit Number 12    Number of Visits 20    Date for PT Re-Evaluation 06/18/22    Authorization Type IE: 01/06/22; PN/RC: 04/09/22    PT Start Time 1305    PT Stop Time 1345    PT Time Calculation (min) 40 min    Activity Tolerance Patient tolerated treatment well             Past Medical History:  Diagnosis Date   Allergic state    Anemia    Complication of anesthesia    Diverticulosis    GERD (gastroesophageal reflux disease)    History of hiatal hernia    History of kidney stones    Hypercholesterolemia    Hypertension    Hypothyroidism    Osteoporosis, postmenopausal    PONV (postoperative nausea and vomiting)    nauseated with colonoscopy   Shingles    Past Surgical History:  Procedure Laterality Date   BREAST BIOPSY Left 90s   benign   COLONOSCOPY WITH PROPOFOL N/A 01/28/2016   Procedure: COLONOSCOPY WITH PROPOFOL;  Surgeon: Lollie Sails, MD;  Location: [facility] ENDOSCOPY;  Service: Endoscopy;  Laterality: N/A;   EXCISION METACARPAL MASS Right 04/30/2017   Procedure: EXCISION MUCOID CYST FIFTH FINGER RIGHT HAND;  Surgeon: Hessie Knows, MD;  Location: [facility] ORS;  Service: Orthopedics;  Laterality: Right;   EYE SURGERY Bilateral 2010   cataracts   Patient Active Problem List   Diagnosis Date Noted   Fatigue 02/05/2022   Leg cramps 07/14/2021   Kidney cysts 03/20/2021   Chest pain 02/14/2021   Monocytosis 02/14/2021   Abnormal liver function test 02/14/2021   Back pain 10/11/2020   Centrilobular emphysema ([place]) 02/13/2020   Former smoker 02/13/2020   Sleeping difficulties 01/22/2020   Leukopenia 09/12/2019   SOB (shortness of breath) 06/06/2019   Chest tightness 03/20/2019   Osteoporosis 07/12/2018   Bilateral hip pain 06/12/2018   Aortic  atherosclerosis ([place]) 10/10/2017   Finger pain, right 07/05/2017   Acute colitis 04/17/2016   Diarrhea 04/17/2016   Hypokalemia 04/17/2016   Headache 04/17/2016   GIB (gastrointestinal bleeding) 04/14/2016   History of colonic polyps 01/31/2016   Change in bowel movement 10/21/2015   Pain of both thumbs 01/14/2015   Right hip pain 01/10/2015   Health care maintenance 09/04/2014   Abnormal mammogram 09/25/2013   Abdominal pain 02/22/2013   Anemia 05/01/2012   GERD (gastroesophageal reflux disease) 05/01/2012   Hematuria 05/01/2012   Hypothyroidism 05/01/2012   Hypercholesterolemia 05/01/2012   Hypertension 05/01/2012    PCP: Einar Pheasant, MD  REFERRING PROVIDER:  [phone] (ICD-10-CM) - Pelvic floor dysfunction   REFERRING DIAG: Ok Edwards, NP   THERAPY DIAG:  Pelvic floor dysfunction  Other lack of coordination  Muscle weakness (generalized)  Rationale for Evaluation and Treatment: Rehabilitation  ONSET DATE: 5 years ago  PRECAUTIONS: None  WEIGHT BEARING RESTRICTIONS: No  FALLS:  Has patient fallen in last 6 months? No  OCCUPATION/SOCIAL ACTIVITIES: Retired, gardening, reading, walking, Silver Sneakers once/week   CHIEF CONCERN: Pt has had a loss of bladder control and she feels it is getting worse because she is having bowel leakage as well. Bowel leakage began about a year ago. Urinary leakage began 5 years ago and she began wearing a thin pad. Urinary leakage has become worse. Physical activity, squatting, sneezing, coughing, laughing all causes urinary leakage. When Pt gardens, she feels her pad is soiled after. Pt at times feels she cannot sense her bowel movements coming out when she sits on the toilet to urinate. Pt does not have pain with bowel movements or  straining but sometimes will push so more comes out in order to not have bowel leakage. Bowel leakage is noticed more with walking/exercising or after a Silver Sneaker class. Pt has had instances of both urinary and bowel leakage when performing physical activity.   NPRS scale: 0/10 (current), 3/10 (worst) Pain location: lower back   Pain type: aching Pain description: intermittent   Aggravating factors: in the morning Relieving factors: chiropractor once a month, heat   LIVING ENVIRONMENT: Lives with: lives with their spouse Lives in: House/apartment   PATIENT GOALS: Not to have bowel leakage and not have as much urine leakage, Pt has COPD and wants to not leak as much with coughing     UROLOGICAL HISTORY Fluid intake: 2 cups of coffee in morning, water during day, 1 soft drink a day, milk   Pain with urination: No Fully empty bladder: No  Stream: constant  Urgency: No Frequency: 7-8x/day Nocturia: 1x  Leakage: Walking to the bathroom, Coughing, Sneezing, Laughing, Exercise, Lifting, and Bending forward Pads: Yes Type: Poise, Lvl 4 Amount: 3x/day Bladder control (0-10): 4/10  GASTROINTESTINAL HISTORY Pain with bowel movement: No Type of bowel movement:Type (Bristol Stool Scale) Type 1 and Type 4, Frequency 1x/day, and Strain No Frequency: 1x/day Fully empty rectum: No Leakage: Yes, mucus like but can be formed too, 5x/week Pads: Yes - wears a long one for urinary incontinence  Stool softner- every now and then    SEXUAL HISTORY/FUNCTION Pt has no concerns    OBSTETRICAL HISTORY Vaginal deliveries: G3P3 Tearing: Yes - episiotomy with first child    GYNECOLOGICAL HISTORY Hysterectomy: no Pelvic Organ Prolapse: none Heaviness/pressure: no   SUBJECTIVE:  Pt is doing well and has had minimal urinary leakage. Pt has only once instance with various sneezes that she had some urinary leakage. Pt continues to wear Lvl 2 pads more for "just in case" but not really  having urinary leakage.    PAIN:  Are you having pain? No    OBJECTIVE:    COGNITION: Overall cognitive status: Within functional limits for tasks assessed     POSTURE:  Thoracic kyphosis: slight in standing Iliac crest height: L iliac crest Pelvic obliquity: WNL   SENSATION:  Light touch: intact, L2-S2 dermatomes     RANGE OF MOTION:    (Norm range in degrees)  LEFT 01/14/22 RIGHT 01/14/22  Lumbar forward flexion (65):  WNL    Lumbar extension (30): WNL    Lumbar lateral flexion (25):  WNL WNL  Thoracic and Lumbar rotation (30 degrees):    WNL WNL  Hip Flexion (0-125):   WNL WNL  Hip IR (0-45):  WNL WNL  Hip ER (0-45):  WNL WNL  Hip Adduction:  Hip Abduction (0-40):  WNL WNL  Hip extension (0-15):     (*= pain, Blank rows = not tested)   STRENGTH: MMT   RLE 01/14/22 LLE 01/14/22  Hip Flexion 4* 4  Hip Extension 4 4  Hip Abduction     Hip Adduction     Hip ER  5 5  Hip IR  4* 5  Knee Extension 5 5  Knee Flexion 4 4  Dorsiflexion     Plantarflexion (seated) 5 5  (*= pain, Blank rows = not tested)   SPECIAL TESTS:  FABER (SN 81): negative B FADIR (SN 94): negative B   PALPATION:  Abdominal:  Diastasis: none Rib flare: present B Tension felt throughout the abdominal cavity   R gluteal musculature and piriformis with significant tension compared to L, causing discomfort upon palpation   EXTERNAL PELVIC EXAM: Patient educated on the purpose of the pelvic exam and articulated understanding; patient consented to the exam verbally.  Breath coordination: inconsistent Voluntary Contraction: present, 2/5 MMT with Valsalva Relaxation: delayed Perineal movement with sustained IAP increase ("bear down"): elevation with significant pelvic movement and Valsalva Perineal movement with rapid IAP increase ("cough"): no change (0= no contraction, 1= flicker, 2= weak squeeze, 3= fair squeeze with lift, 4= good squeeze and lift against resistance, 5= strong  squeeze against strong resistance)    TODAY'S TREATMENT   Therapeutic Exercise:  Supine hip 3-way for improved LE strengthening, VCs and TCs required for proper technique and continued breathing Flex/abd/ext  Lateral sidestepping RTB, x2 rounds, for improved LE strengthening and postural stability    Neuromuscular Re-education: Discussion on gluteal activation and how that can affect the pelvic floor (increased activity) and lead to fatigue when it comes to activating with IAP. Pt verbalized understanding.    Discussion on PFM strengthening and when to increase to x3 repetitions if quick flick feels the same. Pt verbalized understanding.   Discussion on quality vs quantity approach to activities.   Patient response to interventions: Pt definitely felt abductors working with sidestepping   Patient Education:  Patient provided with HEP including: LE strengthening techniques above. Patient educated throughout session on appropriate technique and form using multi-modal cueing, HEP, and activity modification. Patient will benefit from further education in order to maximize compliance and understanding for long-term therapeutic gains.   ASSESSMENT:  Clinical Impression: Patient with excellent motivation to participate in today's session. Pt continues to demonstrate deficits in IAP management, PFM coordination, PFM strength, LE strength, and posture. Pt has noticed improved urinary leakage especially when walking to the bathroom. Pt reports continuing to wear Lvl 2 pad "just in case" due to some instances having urinary leakage with vigorous sneezing. Discussion on postural stance with shifting COG more anteriorly on both hindfoot/midfoot in order to reduce B knee hyperextension causing gluteal over activity. Pt verbalized understanding. Pt required moderate VCs and TCs with LE strengthening exercises to avoid Valsalva maneuver and decrease bodily compensations. Pt responded positively to  active and educational interventions. Pt will continue to benefit from skilled therapeutic intervention to address remaining deficits in IAP management, PFM coordination, PFM strength, LE strength, and posture in order to return to PLOF and improve overall QOL.    Objective Impairments: decreased coordination, decreased endurance, decreased strength, improper body mechanics, postural dysfunction, and pain.   Activity Limitations: lifting, bending, sitting, standing, squatting, continence, toileting, and locomotion level  Personal Factors: Age, Behavior pattern, Past/current experiences, Time since onset of injury/illness/exacerbation, and 3+ comorbidities: colitis, COPD, HTN, diverticulitis  are also affecting patient's functional outcome.   Rehab Potential: Good  Clinical Decision Making: Evolving/moderate complexity  Evaluation Complexity: Moderate   GOALS: Goals reviewed with patient? Yes  SHORT TERM GOALS: Target date: 03/31/2022  Patient will demonstrate independence with HEP in order to maximize therapeutic gains and improve carryover from physical therapy sessions to ADLs in the home and community. Baseline: toileting posture/skin protection pads handout; (10/25): IND with HEP Goal status: MET    LONG TERM GOALS: Target date: 03/31/2022  Patient will score >/= 60 on FOTO Urinary Problem  in order to demonstrate improved IAP management, improved PFM coordination, and overall improved QOL.  Baseline: 51; (9/14): 49 Goal status: IN PROGRESS  2.  Patient will report confidence in ability to control bladder > 7/10 in order to demonstrate improved function and ability to participate more fully in activities at home and in the community. Baseline: 4/10; (10/24): 8/10 Goal status: MET  3.  Patient will report being able to return to activities including, but not limited to: gardening, walking, physical fitness classes, household chores without disruption or limitation to indicate  complete resolution of the chief concern and return to prior level of participation at home and in the community. Baseline: has leakage with all the above; (9/14): no leakage with exercise, household chores, none when outside attending to garden; (10/24): no leakage with any of the above  Goal status: MET   4.  Patient will report decreased reliance on protective undergarments as indicated by a 24 hour period to demonstrate improved bladder control and allow for increased participation in activities outside of the home. Baseline: Lvl 4 incontinence Poise and 3x/day, (9/14): 1-2x/day, but only 1 is damp; (10/24): Lvl 2 very light, 2x due to being damp  Goal status: IN PROGRESS  5.  Patient will demonstrate independent and coordinated diaphragmatic breathing in supine with a 1:2 breathing pattern for improved down-regulation of the nervous system and improved management of intra-abdominal pressures in order to increase function at home and in the community. Baseline: inconsistent breath/activation of abdominals on exhale; (10/25): coordinated breathing and improvement without chest excursion  Goal status: MET  6.  Patient will report less than 5 incidents of stress urinary incontinence over the course of 3 weeks while physical activity/coughing/sneezing/laughing/prolonged activity in order to demonstrate improved PFM coordination, strength, and function for improved overall QOL. Baseline: urinary leakage with all the above, bowel leakage more with physical activity; (9/14): leakage with coughing and sneezing; (10/25): more urinary leakage with walking to the bathroom  Goal status: IN PROGRESS  PLAN: PT Frequency: 1x/week  PT Duration: 12 weeks  Planned Interventions: Therapeutic exercises, Therapeutic activity, Neuromuscular re-education, Balance training, Gait training, Patient/Family education, Self Care, Joint mobilization, Cryotherapy, Moist heat, scar mobilization, Taping, and Manual  therapy  Plan For Next Session:  check hip, continue deep core in standing dead bug on wall, how was LE strength, did you try lighter pad? Check back in 3 weeks?    Oryon Gary, PT, DPT  05/07/2022, 1:01 PM

## 2022-05-12 ENCOUNTER — Ambulatory Visit: Payer: Medicare PPO

## 2022-05-21 ENCOUNTER — Ambulatory Visit: Payer: Medicare PPO | Attending: Gastroenterology

## 2022-05-21 DIAGNOSIS — M6281 Muscle weakness (generalized): Secondary | ICD-10-CM | POA: Diagnosis not present

## 2022-05-21 DIAGNOSIS — M6289 Other specified disorders of muscle: Secondary | ICD-10-CM

## 2022-05-21 DIAGNOSIS — R278 Other lack of coordination: Secondary | ICD-10-CM

## 2022-05-21 NOTE — Therapy (Signed)
OUTPATIENT PHYSICAL THERAPY FEMALE PELVIC DISCHARGE   Patient Name: Savannah Cox MRN: [id number] DOB:1945/09/13, 76 y.o., female Today's Date: 05/21/2022   PT End of Session - 05/21/22 1451     Visit Number 13    Number of Visits 20    Date for PT Re-Evaluation 06/18/22    Authorization Type IE: 01/06/22; PN/RC: 04/09/22    PT Start Time 1445    PT Stop Time 1510    PT Time Calculation (min) 25 min    Activity Tolerance Patient tolerated treatment well             Past Medical History:  Diagnosis Date   Allergic state    Anemia    Complication of anesthesia    Diverticulosis    GERD (gastroesophageal reflux disease)    History of hiatal hernia    History of kidney stones    Hypercholesterolemia    Hypertension    Hypothyroidism    Osteoporosis, postmenopausal    PONV (postoperative nausea and vomiting)    nauseated with colonoscopy   Shingles    Past Surgical History:  Procedure Laterality Date   BREAST BIOPSY Left 90s   benign   COLONOSCOPY WITH PROPOFOL N/A 01/28/2016   Procedure: COLONOSCOPY WITH PROPOFOL;  Surgeon: Lollie Sails, MD;  Location: [facility] ENDOSCOPY;  Service: Endoscopy;  Laterality: N/A;   EXCISION METACARPAL MASS Right 04/30/2017   Procedure: EXCISION MUCOID CYST FIFTH FINGER RIGHT HAND;  Surgeon: Hessie Knows, MD;  Location: [facility] ORS;  Service: Orthopedics;  Laterality: Right;   EYE SURGERY Bilateral 2010   cataracts   Patient Active Problem List   Diagnosis Date Noted   Fatigue 02/05/2022   Leg cramps 07/14/2021   Kidney cysts 03/20/2021   Chest pain 02/14/2021   Monocytosis 02/14/2021   Abnormal liver function test 02/14/2021   Back pain 10/11/2020   Centrilobular emphysema ([place]) 02/13/2020   Former smoker 02/13/2020   Sleeping difficulties 01/22/2020   Leukopenia 09/12/2019   SOB (shortness of breath) 06/06/2019   Chest tightness 03/20/2019   Osteoporosis 07/12/2018   Bilateral hip pain 06/12/2018   Aortic atherosclerosis  ([place]) 10/10/2017   Finger pain, right 07/05/2017   Acute colitis 04/17/2016   Diarrhea 04/17/2016   Hypokalemia 04/17/2016   Headache 04/17/2016   GIB (gastrointestinal bleeding) 04/14/2016   History of colonic polyps 01/31/2016   Change in bowel movement 10/21/2015   Pain of both thumbs 01/14/2015   Right hip pain 01/10/2015   Health care maintenance 09/04/2014   Abnormal mammogram 09/25/2013   Abdominal pain 02/22/2013   Anemia 05/01/2012   GERD (gastroesophageal reflux disease) 05/01/2012   Hematuria 05/01/2012   Hypothyroidism 05/01/2012   Hypercholesterolemia 05/01/2012   Hypertension 05/01/2012    PCP: Einar Pheasant, MD  REFERRING PROVIDER:  [phone] (ICD-10-CM) - Pelvic floor dysfunction   REFERRING DIAG: Ok Edwards, NP   THERAPY DIAG:  Pelvic floor dysfunction  Other lack of coordination  Muscle weakness (generalized)  Rationale for Evaluation and Treatment: Rehabilitation  ONSET DATE: 5 years ago  PRECAUTIONS: None  WEIGHT BEARING RESTRICTIONS: No  FALLS:  Has patient fallen in last 6 months? No  OCCUPATION/SOCIAL ACTIVITIES: Retired, gardening, reading, walking, Silver [employer]neakers once/week   CHIEF CONCERN: Pt has had a loss of bladder control and she feels it is getting worse because she is having bowel leakage as well. Bowel leakage began about a year ago. Urinary leakage began 5 years ago and she began wearing a thin pad. Urinary leakage has become worse. Physical activity, squatting, sneezing, coughing, laughing all causes urinary leakage. When Pt gardens, she feels her pad is soiled after. Pt at times feels she cannot sense her bowel movements coming out when she sits on the toilet to urinate. Pt does not have pain with bowel movements or straining but  sometimes will push so more comes out in order to not have bowel leakage. Bowel leakage is noticed more with walking/exercising or after a Silver Sneaker class. Pt has had instances of both urinary and bowel leakage when performing physical activity.   NPRS scale: 0/10 (current), 3/10 (worst) Pain location: lower back   Pain type: aching Pain description: intermittent   Aggravating factors: in the morning Relieving factors: chiropractor once a month, heat   LIVING ENVIRONMENT: Lives with: lives with their spouse Lives in: House/apartment   PATIENT GOALS: Not to have bowel leakage and not have as much urine leakage, Pt has COPD and wants to not leak as much with coughing     UROLOGICAL HISTORY Fluid intake: 2 cups of coffee in morning, water during day, 1 soft drink a day, milk   Pain with urination: No Fully empty bladder: No  Stream: constant  Urgency: No Frequency: 7-8x/day Nocturia: 1x  Leakage: Walking to the bathroom, Coughing, Sneezing, Laughing, Exercise, Lifting, and Bending forward Pads: Yes Type: Poise, Lvl 4 Amount: 3x/day Bladder control (0-10): 4/10  GASTROINTESTINAL HISTORY Pain with bowel movement: No Type of bowel movement:Type (Bristol Stool Scale) Type 1 and Type 4, Frequency 1x/day, and Strain No Frequency: 1x/day Fully empty rectum: No Leakage: Yes, mucus like but can be formed too, 5x/week Pads: Yes - wears a long one for urinary incontinence  Stool softner- every now and then    SEXUAL HISTORY/FUNCTION Pt has no concerns    OBSTETRICAL HISTORY Vaginal deliveries: G3P3 Tearing: Yes - episiotomy with first child    GYNECOLOGICAL HISTORY Hysterectomy: no Pelvic Organ Prolapse: none Heaviness/pressure: no   SUBJECTIVE:  Pt is doing well with no urinary leakage. Pt has been wearing Lvl 1 pantyliner "just in case" but has had no SUI.    PAIN:  Are you having pain? No    OBJECTIVE:    COGNITION: Overall cognitive status: Within  functional limits for tasks assessed     POSTURE:  Thoracic kyphosis: slight in standing Iliac crest height: L iliac crest Pelvic obliquity: WNL   SENSATION:  Light touch: intact, L2-S2 dermatomes     RANGE OF MOTION:    (Norm range in degrees)  LEFT 01/14/22 RIGHT 01/14/22  Lumbar forward flexion (65):  WNL    Lumbar extension (30): WNL    Lumbar lateral flexion (25):  WNL WNL  Thoracic and Lumbar rotation (30 degrees):    WNL WNL  Hip Flexion (0-125):   WNL WNL  Hip IR (0-45):  WNL WNL  Hip ER (0-45):  WNL WNL  Hip Adduction:      Hip Abduction (0-40):  WNL WNL  Hip extension (0-15):     (*= pain, Blank rows =  not tested)   STRENGTH: MMT   RLE 01/14/22 LLE 01/14/22  Hip Flexion 4* 4  Hip Extension 4 4  Hip Abduction     Hip Adduction     Hip ER  5 5  Hip IR  4* 5  Knee Extension 5 5  Knee Flexion 4 4  Dorsiflexion     Plantarflexion (seated) 5 5  (*= pain, Blank rows = not tested)   SPECIAL TESTS:  FABER (SN 81): negative B FADIR (SN 94): negative B   PALPATION:  Abdominal:  Diastasis: none Rib flare: present B Tension felt throughout the abdominal cavity   R gluteal musculature and piriformis with significant tension compared to L, causing discomfort upon palpation   EXTERNAL PELVIC EXAM: Patient educated on the purpose of the pelvic exam and articulated understanding; patient consented to the exam verbally.  Breath coordination: inconsistent Voluntary Contraction: present, 2/5 MMT with Valsalva Relaxation: delayed Perineal movement with sustained IAP increase ("bear down"): elevation with significant pelvic movement and Valsalva Perineal movement with rapid IAP increase ("cough"): no change (0= no contraction, 1= flicker, 2= weak squeeze, 3= fair squeeze with lift, 4= good squeeze and lift against resistance, 5= strong squeeze against strong resistance)    TODAY'S TREATMENT   Neuromuscular Re-education: Reassessment of FOTO for Discharge  FOTO  Urinary Problem - 10/25 51, Today: 75 FOTO Bowel Leakage - IE: 71, Today: 0   Review of goals below for discharge - Pt has met all goals below since IE   Discussing continuing HEP as needed to further improve IAP management, PFM coordination, and LE strength. Pt with no concerns or questions on HEP.   Patient response to interventions: Pt comfortable to discharge.    Patient Education:  Patient provided with HEP including: no change. Patient educated throughout session on appropriate technique and form using multi-modal cueing, HEP, and activity modification. Patient will benefit from further education in order to maximize compliance and understanding for long-term therapeutic gains.   ASSESSMENT:  Clinical Impression: Patient with excellent motivation to participate in today's discharge session. Pt has made significant improvements since re-evaluation on 04/08/22 and overall since IE on 01/06/22. Pt reports no incidences of stress UI and no urinary incontinence while walking to the bathroom. Pt reports wearing a Lvl 1 incontinence liner "just in case" with only changing 1x/day for hygiene purposes which is improvement from October wearing Lvl 2 incontinence pads and changing 2x/day due to incontinence. Pt demonstrates coordinated diaphragmatic breathing and reports being more aware of her body and not delaying urination. Based on FOTO scores, FOTO Urinary Problem (75) and Bowel Leakage (0), Pt has made significant progress since IE (Urinary-49) and (Leakage-71). After discussion on HEP and continuing as needed, Pt is adequate and agrees with discharge from PFPT. Pt given rehab services business card for any future rehab needs.   Objective Impairments: decreased coordination, decreased endurance, decreased strength, improper body mechanics, postural dysfunction, and pain.   Activity Limitations: lifting, bending, sitting, standing, squatting, continence, toileting, and locomotion  level  Personal Factors: Age, Behavior pattern, Past/current experiences, Time since onset of injury/illness/exacerbation, and 3+ comorbidities: colitis, COPD, HTN, diverticulitis  are also affecting patient's functional outcome.   Rehab Potential: Good  Clinical Decision Making: Evolving/moderate complexity  Evaluation Complexity: Moderate   GOALS: Goals reviewed with patient? Yes  SHORT TERM GOALS: Target date: 03/31/2022  Patient will demonstrate independence with HEP in order to maximize therapeutic gains and improve carryover from physical therapy sessions to ADLs in  the home and community. Baseline: toileting posture/skin protection pads handout; (10/25): IND with HEP Goal status: MET    LONG TERM GOALS: Target date: 03/31/2022  Patient will score >/= 60 on FOTO Urinary Problem  in order to demonstrate improved IAP management, improved PFM coordination, and overall improved QOL.  Baseline: 51; (9/14): 49; (12/6): 75 Goal status: MET  2.  Patient will report confidence in ability to control bladder > 7/10 in order to demonstrate improved function and ability to participate more fully in activities at home and in the community. Baseline: 4/10; (10/24): 8/10 Goal status: MET  3.  Patient will report being able to return to activities including, but not limited to: gardening, walking, physical fitness classes, household chores without disruption or limitation to indicate complete resolution of the chief concern and return to prior level of participation at home and in the community. Baseline: has leakage with all the above; (9/14): no leakage with exercise, household chores, none when outside attending to garden; (10/24): no leakage with any of the above  Goal status: MET   4.  Patient will report decreased reliance on protective undergarments as indicated by a 24 hour period to demonstrate improved bladder control and allow for increased participation in activities outside of  the home. Baseline: Lvl 4 incontinence Poise and 3x/day, (9/14): 1-2x/day, but only 1 is damp; (10/24): Lvl 2 very light, 2x due to being damp; (12/6): Lvl 1 very very light, 1x but only for hygiene    Goal status: MET  5.  Patient will demonstrate independent and coordinated diaphragmatic breathing in supine with a 1:2 breathing pattern for improved down-regulation of the nervous system and improved management of intra-abdominal pressures in order to increase function at home and in the community. Baseline: inconsistent breath/activation of abdominals on exhale; (10/25): coordinated breathing and improvement without chest excursion  Goal status: MET  6.  Patient will report less than 5 incidents of stress urinary incontinence over the course of 3 weeks while physical activity/coughing/sneezing/laughing/prolonged activity in order to demonstrate improved PFM coordination, strength, and function for improved overall QOL. Baseline: urinary leakage with all the above, bowel leakage more with physical activity; (9/14): leakage with coughing and sneezing; (10/25): more urinary leakage with walking to the bathroom; (12/6): Pt with no stress UI   Goal status: MET  PLAN: PT Frequency: 1x/week  PT Duration: 12 weeks  Planned Interventions: Therapeutic exercises, Therapeutic activity, Neuromuscular re-education, Balance training, Gait training, Patient/Family education, Self Care, Joint mobilization, Cryotherapy, Moist heat, scar mobilization, Taping, and Manual therapy    Tamer Baughman, PT, DPT  05/21/2022, 3:18 PM

## 2022-05-26 ENCOUNTER — Ambulatory Visit: Payer: Medicare PPO

## 2022-06-02 DIAGNOSIS — H524 Presbyopia: Secondary | ICD-10-CM | POA: Diagnosis not present

## 2022-06-02 DIAGNOSIS — M9905 Segmental and somatic dysfunction of pelvic region: Secondary | ICD-10-CM | POA: Diagnosis not present

## 2022-06-02 DIAGNOSIS — H43811 Vitreous degeneration, right eye: Secondary | ICD-10-CM | POA: Diagnosis not present

## 2022-06-02 DIAGNOSIS — M62830 Muscle spasm of back: Secondary | ICD-10-CM | POA: Diagnosis not present

## 2022-06-02 DIAGNOSIS — M5136 Other intervertebral disc degeneration, lumbar region: Secondary | ICD-10-CM | POA: Diagnosis not present

## 2022-06-02 DIAGNOSIS — M9903 Segmental and somatic dysfunction of lumbar region: Secondary | ICD-10-CM | POA: Diagnosis not present

## 2022-06-03 ENCOUNTER — Other Ambulatory Visit (INDEPENDENT_AMBULATORY_CARE_PROVIDER_SITE_OTHER): Payer: Medicare PPO

## 2022-06-03 ENCOUNTER — Ambulatory Visit: Payer: Medicare PPO

## 2022-06-03 DIAGNOSIS — E7800 Pure hypercholesterolemia, unspecified: Secondary | ICD-10-CM

## 2022-06-03 DIAGNOSIS — I10 Essential (primary) hypertension: Secondary | ICD-10-CM | POA: Diagnosis not present

## 2022-06-03 LAB — LIPID PANEL
Cholesterol: 204 mg/dL — ABNORMAL HIGH (ref 0–200)
HDL: 79.6 mg/dL (ref >39.00)
LDL Cholesterol: 103 mg/dL — ABNORMAL HIGH (ref 0–99)
NonHDL: 124.05
Total CHOL/HDL Ratio: 3
Triglycerides: 104 mg/dL (ref 0.0–149.0)
VLDL: 20.8 mg/dL (ref 0.0–40.0)

## 2022-06-03 LAB — BASIC METABOLIC PANEL
BUN: 13 mg/dL (ref 6–23)
CO2: 28 mEq/L (ref 19–32)
Calcium: 9.5 mg/dL (ref 8.4–10.5)
Chloride: 105 mEq/L (ref 96–112)
Creatinine, Ser: 0.69 mg/dL (ref 0.40–1.20)
GFR: 85.44 mL/min (ref >60.00)
Glucose, Bld: 101 mg/dL — ABNORMAL HIGH (ref 70–99)
Potassium: 4 mEq/L (ref 3.5–5.1)
Sodium: 141 mEq/L (ref 135–145)

## 2022-06-03 LAB — HEPATIC FUNCTION PANEL
ALT: 31 U/L (ref 0–35)
AST: 35 U/L (ref 0–37)
Albumin: 4.1 g/dL (ref 3.5–5.2)
Alkaline Phosphatase: 61 U/L (ref 39–117)
Bilirubin, Direct: 0.1 mg/dL (ref 0.0–0.3)
Total Bilirubin: 0.5 mg/dL (ref 0.2–1.2)
Total Protein: 6.4 g/dL (ref 6.0–8.3)

## 2022-06-05 ENCOUNTER — Other Ambulatory Visit: Payer: Medicare PPO

## 2022-06-05 ENCOUNTER — Ambulatory Visit: Payer: Medicare PPO | Admitting: Internal Medicine

## 2022-06-05 ENCOUNTER — Encounter: Payer: Self-pay | Admitting: Internal Medicine

## 2022-06-05 VITALS — BP 130/62 | HR 68 | Temp 97.9°F | Resp 14 | Ht 63.0 in | Wt 143.2 lb

## 2022-06-05 DIAGNOSIS — E7800 Pure hypercholesterolemia, unspecified: Secondary | ICD-10-CM | POA: Diagnosis not present

## 2022-06-05 DIAGNOSIS — I700 Atherosclerosis of aorta: Secondary | ICD-10-CM

## 2022-06-05 DIAGNOSIS — M810 Age-related osteoporosis without current pathological fracture: Secondary | ICD-10-CM

## 2022-06-05 DIAGNOSIS — R7989 Other specified abnormal findings of blood chemistry: Secondary | ICD-10-CM | POA: Diagnosis not present

## 2022-06-05 DIAGNOSIS — E039 Hypothyroidism, unspecified: Secondary | ICD-10-CM | POA: Diagnosis not present

## 2022-06-05 DIAGNOSIS — J432 Centrilobular emphysema: Secondary | ICD-10-CM

## 2022-06-05 DIAGNOSIS — D649 Anemia, unspecified: Secondary | ICD-10-CM | POA: Diagnosis not present

## 2022-06-05 DIAGNOSIS — I10 Essential (primary) hypertension: Secondary | ICD-10-CM

## 2022-06-05 DIAGNOSIS — D72819 Decreased white blood cell count, unspecified: Secondary | ICD-10-CM | POA: Diagnosis not present

## 2022-06-05 DIAGNOSIS — K219 Gastro-esophageal reflux disease without esophagitis: Secondary | ICD-10-CM | POA: Diagnosis not present

## 2022-06-05 MED ORDER — LISINOPRIL 10 MG PO TABS
10.0000 mg | ORAL_TABLET | Freq: Every day | ORAL | 1 refills | Status: DC
Start: 2022-06-05 — End: 2023-01-06

## 2022-06-05 MED ORDER — LEVOTHYROXINE SODIUM 75 MCG PO TABS: 75.0000 ug | ORAL_TABLET | Freq: Every day | ORAL | 2 refills | Status: DC

## 2022-06-05 MED ORDER — ROSUVASTATIN CALCIUM 20 MG PO TABS: 20.0000 mg | ORAL_TABLET | Freq: Every day | ORAL | 1 refills | Status: DC

## 2022-06-05 NOTE — Progress Notes (Signed)
Patient ID: Savannah Cox, female   DOB: December 08, 1945, 76 y.o.   MRN: [id number]   Subjective:    Patient ID: Savannah Cox, female    DOB: [birth year], 75 y.o.   MRN: [id number]   Patient here for a scheduled follow up.   Chief Complaint  Patient presents with   Medical Management of Chronic Issues   Hypertension   .   HPI Here to follow up regarding her cholesterol and blood pressure.  Saw GI 12/31/21 - f/u - likely IBS - recommended daily fiber.  Discussed today - adding benefiber daily to help keep bowels regular. Also f/u - elevated AST.  Liver unremarkable.  Recent liver panel wnl. Recommended diet and exercise.  Previously had some bladder/bowel issues - leakage.  Has been going to PT for pelvic floor therapy.  Completed.  Is better.  Saw cardiology 12/30/21 - discussed coronary CTA.  Elected to follow.  Blood pressures - outside checks - 120/70s.  She is off omeprazole.  Off for two weeks.  Does not feel needs.  Will take TUMS prn.  Discussed remaining off and avoiding possible triggers.      Past Medical History:  Diagnosis Date   Allergic state    Anemia    Complication of anesthesia    Diverticulosis    GERD (gastroesophageal reflux disease)    History of hiatal hernia    History of kidney stones    Hypercholesterolemia    Hypertension    Hypothyroidism    Osteoporosis, postmenopausal    PONV (postoperative nausea and vomiting)    nauseated with colonoscopy   Shingles    Past Surgical History:  Procedure Laterality Date   BREAST BIOPSY Left 90s   benign   COLONOSCOPY WITH PROPOFOL N/A 01/28/2016   Procedure: COLONOSCOPY WITH PROPOFOL;  Surgeon: Lollie Sails, MD;  Location: [facility] ENDOSCOPY;  Service: Endoscopy;  Laterality: N/A;   EXCISION METACARPAL MASS Right 04/30/2017   Procedure: EXCISION MUCOID CYST FIFTH FINGER RIGHT HAND;  Surgeon: Hessie Knows, MD;  Location: [facility] ORS;  Service: Orthopedics;  Laterality: Right;   EYE SURGERY Bilateral 2010   cataracts    Family History  Problem Relation Age of Onset   Heart disease Father        died age 74 - myocardial infarction   Hypercholesterolemia Mother    Arthritis Mother    Heart attack Mother    Kidney cancer Other        aunt   Breast cancer Maternal Aunt    Colon cancer Neg Hx    Social History   Socioeconomic History   Marital status: Married    Spouse name: Not on file   Number of children: 3   Years of education: 16   Highest education level: Not on file  Occupational History   Occupation: retired Product manager: RETIRED  Tobacco Use   Smoking status: Former    Packs/day: 1.50    Years: 30.00    Total pack years: 45.00    Types: Cigarettes    Quit date: 1990    Years since quitting: 34.0   Smokeless tobacco: Never  Vaping Use   Vaping Use: Never used  Substance and Sexual Activity   Alcohol use: Yes    Alcohol/week: 0.0 standard drinks of alcohol    Comment: Rare drink, one drink per month   Drug use: No   Sexual activity: Yes  Other Topics Concern   Not on file  Social History Narrative   Not on file   Social Determinants of Health   Financial Resource Strain: Low Risk  (10/30/2021)   Overall Financial Resource Strain (CARDIA)    Difficulty of Paying Living Expenses: Not hard at all  Food Insecurity: No Food Insecurity (10/30/2021)   Hunger Vital Sign    Worried About Running Out of Food in the Last Year: Never true    Ran Out of Food in the Last Year: Never true  Transportation Needs: No Transportation Needs (10/30/2021)   PRAPARE - Hydrologist (Medical): No    Lack of Transportation (Non-Medical): No  Physical Activity: Sufficiently Active (10/30/2021)   Exercise Vital Sign    Days of Exercise per Week: 3 days    Minutes of Exercise per Session: 60 min  Stress: No Stress Concern Present (10/30/2021)   [place]    Feeling of Stress : Not at all  Social  Connections: Unknown (10/30/2021)   Social Connection and Isolation Panel [NHANES]    Frequency of Communication with Friends and Family: More than three times a week    Frequency of Social Gatherings with Friends and Family: More than three times a week    Attends Religious Services: Not on Advertising copywriter or Organizations: Not on file    Attends Archivist Meetings: Not on file    Marital Status: Married     Review of Systems  Constitutional:  Negative for appetite change and unexpected weight change.  HENT:  Negative for congestion and sinus pressure.   Respiratory:  Negative for cough, chest tightness and shortness of breath.   Cardiovascular:  Negative for chest pain, palpitations and leg swelling.  Gastrointestinal:  Negative for abdominal pain, diarrhea, nausea and vomiting.  Genitourinary:  Negative for difficulty urinating and dysuria.  Musculoskeletal:  Negative for joint swelling and myalgias.  Skin:  Negative for color change and rash.  Neurological:  Negative for dizziness, light-headedness and headaches.  Psychiatric/Behavioral:  Negative for agitation and dysphoric mood.        Objective:     BP 130/62 (BP Location: Left Arm, Patient Position: Sitting, Cuff Size: Small)   Pulse 68   Temp 97.9 F (36.6 C) (Temporal)   Resp 14   Ht '5\' 3"'$  (1.6 m)   Wt 143 lb 3.2 oz (65 kg)   SpO2 98%   BMI 25.37 kg/m  Wt Readings from Last 3 Encounters:  06/05/22 143 lb 3.2 oz (65 kg)  02/28/22 142 lb 9.6 oz (64.7 kg)  02/05/22 142 lb 3.2 oz (64.5 kg)    Physical Exam Vitals reviewed.  Constitutional:      General: She is not in acute distress.    Appearance: Normal appearance.  HENT:     Head: Normocephalic and atraumatic.     Right Ear: External ear normal.     Left Ear: External ear normal.  Eyes:     General: No scleral icterus.       Right eye: No discharge.        Left eye: No discharge.     Conjunctiva/sclera: Conjunctivae normal.   Neck:     Thyroid: No thyromegaly.  Cardiovascular:     Rate and Rhythm: Normal rate and regular rhythm.  Pulmonary:     Effort: No respiratory distress.     Breath sounds: Normal breath sounds. No wheezing.  Abdominal:  General: Bowel sounds are normal.     Palpations: Abdomen is soft.     Tenderness: There is no abdominal tenderness.  Musculoskeletal:        General: No swelling or tenderness.     Cervical back: Neck supple. No tenderness.  Lymphadenopathy:     Cervical: No cervical adenopathy.  Skin:    Findings: No erythema or rash.  Neurological:     Mental Status: She is alert.  Psychiatric:        Mood and Affect: Mood normal.        Behavior: Behavior normal.      Outpatient Encounter Medications as of 06/05/2022  Medication Sig   Fluticasone-Umeclidin-Vilant (TRELEGY ELLIPTA) 100-62.5-25 MCG/ACT AEPB Inhale 1 puff into the lungs daily.   loratadine (CLARITIN) 10 MG tablet Take 10 mg by mouth daily.    Multiple Vitamins-Minerals (CENTRUM SILVER ULTRA WOMENS PO) Take 1 tablet by mouth daily.    omeprazole (PRILOSEC) 40 MG capsule TAKE 1 CAPSULE (40 MG TOTAL) BY MOUTH ONCE DAILY TAKE 30 MINS BEFORE A MEAL   rosuvastatin (CRESTOR) 20 MG tablet Take 1 tablet (20 mg total) by mouth daily.   sertraline (ZOLOFT) 50 MG tablet Take 1 tablet (50 mg total) by mouth daily.   [DISCONTINUED] levothyroxine (SYNTHROID) 75 MCG tablet Take 1 tablet (75 mcg total) by mouth daily.   [DISCONTINUED] lisinopril (ZESTRIL) 10 MG tablet Take 1 tablet (10 mg total) by mouth daily.   [DISCONTINUED] rosuvastatin (CRESTOR) 10 MG tablet Take 1 tablet (10 mg total) by mouth daily.   levothyroxine (SYNTHROID) 75 MCG tablet Take 1 tablet (75 mcg total) by mouth daily.   lisinopril (ZESTRIL) 10 MG tablet Take 1 tablet (10 mg total) by mouth daily.   [DISCONTINUED] TRELEGY ELLIPTA 100-62.5-25 MCG/INH AEPB TAKE 1 PUFF BY MOUTH EVERY DAY   No facility-administered encounter medications on file as of  06/05/2022.     Lab Results  Component Value Date   WBC 5.0 02/05/2022   HGB 13.8 02/05/2022   HCT 41.0 02/05/2022   PLT 218.0 02/05/2022   GLUCOSE 101 (H) 06/03/2022   CHOL 204 (H) 06/03/2022   TRIG 104.0 06/03/2022   HDL 79.60 06/03/2022   LDLDIRECT 165.9 05/11/2013   LDLCALC 103 (H) 06/03/2022   ALT 31 06/03/2022   AST 35 06/03/2022   NA 141 06/03/2022   K 4.0 06/03/2022   CL 105 06/03/2022   CREATININE 0.69 06/03/2022   BUN 13 06/03/2022   CO2 28 06/03/2022   TSH 1.78 02/05/2022   INR 0.96 04/14/2016    CT ABDOMEN PELVIS W CONTRAST  Result Date: 11/07/2021 CLINICAL DATA:  Left lower quadrant tenderness for 3 weeks. EXAM: CT ABDOMEN AND PELVIS WITH CONTRAST TECHNIQUE: Multidetector CT imaging of the abdomen and pelvis was performed using the standard protocol following bolus administration of intravenous contrast. RADIATION DOSE REDUCTION: This exam was performed according to the departmental dose-optimization program which includes automated exposure control, adjustment of the mA and/or kV according to patient size and/or use of iterative reconstruction technique. CONTRAST:  43m OMNIPAQUE IOHEXOL 300 MG/ML  SOLN COMPARISON:  02/10/2020 FINDINGS: Lower chest: No acute findings. Hepatobiliary: No suspicious focal abnormality within the liver parenchyma. There is no evidence for gallstones, gallbladder wall thickening, or pericholecystic fluid. No intrahepatic or extrahepatic biliary dilation. Pancreas: No focal mass lesion. No dilatation of the main duct. No intraparenchymal cyst. No peripancreatic edema. Spleen: No splenomegaly. No focal mass lesion. Adrenals/Urinary Tract: No adrenal nodule or mass. Duplicated  intrarenal collecting system noted bilaterally. Stable small bilateral renal cysts. No evidence for hydroureter. The urinary bladder appears normal for the degree of distention. Stomach/Bowel: Small hiatal hernia. Stomach is unremarkable. No gastric wall thickening. No  evidence of outlet obstruction. Duodenum is normally positioned as is the ligament of Treitz. No small bowel wall thickening. No small bowel dilatation. The terminal ileum is normal. The appendix is not well visualized, but there is no edema or inflammation in the region of the cecum. No gross colonic mass. No colonic wall thickening. Diverticular changes are noted in the left colon without evidence of diverticulitis. Vascular/Lymphatic: There is moderate atherosclerotic calcification of the abdominal aorta without aneurysm. There is no gastrohepatic or hepatoduodenal ligament lymphadenopathy. No retroperitoneal or mesenteric lymphadenopathy. No pelvic sidewall lymphadenopathy. Reproductive: The uterus is unremarkable.  There is no adnexal mass. Other: No intraperitoneal free fluid. Musculoskeletal: No worrisome lytic or sclerotic osseous abnormality. Pelvic floor laxity evident. IMPRESSION: 1. No acute findings in the abdomen or pelvis. 2. Left colonic diverticulosis without diverticulitis. 3. Small hiatal hernia. 4. Aortic Atherosclerosis (ICD10-I70.0). Electronically Signed   By: Misty Stanley M.D.   On: 11/07/2021 08:23       Assessment & Plan:   Problem List Items Addressed This Visit     Abnormal liver function test    Abdominal ultrasound - no liver abnormality.  Recent liver panel wnl.       Anemia    Follow cbc.       Relevant Orders   CBC w/Diff   Aortic atherosclerosis ([place])    Continue crestor.       Relevant Medications   lisinopril (ZESTRIL) 10 MG tablet   rosuvastatin (CRESTOR) 20 MG tablet   Centrilobular emphysema ([place])    Evaluated by pulmonary as outlined.   Continue trelegy.        GERD (gastroesophageal reflux disease)    Off prilosec.  Doing well off.  Avoid possible triggers.  Notify - if symptoms return.       Hypercholesterolemia    Continue crestor.  Low cholesterol diet and exercise.  Follow lipid panel and liver function tests.        Relevant  Medications   lisinopril (ZESTRIL) 10 MG tablet   rosuvastatin (CRESTOR) 20 MG tablet   Other Relevant Orders   Hepatic function panel   Lipid Profile   Hypertension - Primary    Continue lisinopril.  Blood pressure doing well.  Follow pressure.  Follow metabolic panel.       Relevant Medications   lisinopril (ZESTRIL) 10 MG tablet   rosuvastatin (CRESTOR) 20 MG tablet   Other Relevant Orders   Basic Metabolic Panel (BMET)   Hypothyroidism    On thyroid replacement.  Follow tsh.        Relevant Medications   levothyroxine (SYNTHROID) 75 MCG tablet   Leukopenia    Follow cbc.       Osteoporosis    Reclast.  Followed by endocrinology.        Einar Pheasant, MD

## 2022-06-11 ENCOUNTER — Ambulatory Visit: Payer: Medicare PPO | Admitting: Internal Medicine

## 2022-06-15 ENCOUNTER — Encounter: Payer: Self-pay | Admitting: Internal Medicine

## 2022-06-15 NOTE — Assessment & Plan Note (Signed)
Off prilosec.  Doing well off.  Avoid possible triggers.  Notify - if symptoms return.

## 2022-06-15 NOTE — Assessment & Plan Note (Signed)
Evaluated by pulmonary as outlined.   Continue trelegy.

## 2022-06-15 NOTE — Assessment & Plan Note (Signed)
Continue lisinopril.  Blood pressure doing well.  Follow pressure.  Follow metabolic panel.

## 2022-06-15 NOTE — Assessment & Plan Note (Signed)
Reclast.  Followed by endocrinology.

## 2022-06-15 NOTE — Assessment & Plan Note (Signed)
Continue crestor.  Low cholesterol diet and exercise. Follow lipid panel and liver function tests.   

## 2022-06-15 NOTE — Assessment & Plan Note (Signed)
Follow cbc.  

## 2022-06-15 NOTE — Assessment & Plan Note (Signed)
Continue crestor 

## 2022-06-15 NOTE — Assessment & Plan Note (Signed)
On thyroid replacement.  Follow tsh.  

## 2022-06-15 NOTE — Assessment & Plan Note (Signed)
Abdominal ultrasound - no liver abnormality.  Recent liver panel wnl.

## 2022-06-30 DIAGNOSIS — M5136 Other intervertebral disc degeneration, lumbar region: Secondary | ICD-10-CM | POA: Diagnosis not present

## 2022-06-30 DIAGNOSIS — M9903 Segmental and somatic dysfunction of lumbar region: Secondary | ICD-10-CM | POA: Diagnosis not present

## 2022-06-30 DIAGNOSIS — M9905 Segmental and somatic dysfunction of pelvic region: Secondary | ICD-10-CM | POA: Diagnosis not present

## 2022-06-30 DIAGNOSIS — M62830 Muscle spasm of back: Secondary | ICD-10-CM | POA: Diagnosis not present

## 2022-07-02 DIAGNOSIS — J432 Centrilobular emphysema: Secondary | ICD-10-CM | POA: Diagnosis not present

## 2022-07-02 DIAGNOSIS — R9431 Abnormal electrocardiogram [ECG] [EKG]: Secondary | ICD-10-CM | POA: Diagnosis not present

## 2022-07-02 DIAGNOSIS — R0602 Shortness of breath: Secondary | ICD-10-CM | POA: Diagnosis not present

## 2022-07-02 DIAGNOSIS — I700 Atherosclerosis of aorta: Secondary | ICD-10-CM | POA: Diagnosis not present

## 2022-07-02 DIAGNOSIS — E7800 Pure hypercholesterolemia, unspecified: Secondary | ICD-10-CM | POA: Diagnosis not present

## 2022-07-28 DIAGNOSIS — M62830 Muscle spasm of back: Secondary | ICD-10-CM | POA: Diagnosis not present

## 2022-07-28 DIAGNOSIS — M9903 Segmental and somatic dysfunction of lumbar region: Secondary | ICD-10-CM | POA: Diagnosis not present

## 2022-07-28 DIAGNOSIS — M5136 Other intervertebral disc degeneration, lumbar region: Secondary | ICD-10-CM | POA: Diagnosis not present

## 2022-07-28 DIAGNOSIS — M9905 Segmental and somatic dysfunction of pelvic region: Secondary | ICD-10-CM | POA: Diagnosis not present

## 2022-08-25 DIAGNOSIS — M9905 Segmental and somatic dysfunction of pelvic region: Secondary | ICD-10-CM | POA: Diagnosis not present

## 2022-08-25 DIAGNOSIS — M9903 Segmental and somatic dysfunction of lumbar region: Secondary | ICD-10-CM | POA: Diagnosis not present

## 2022-08-25 DIAGNOSIS — M5136 Other intervertebral disc degeneration, lumbar region: Secondary | ICD-10-CM | POA: Diagnosis not present

## 2022-08-25 DIAGNOSIS — M62830 Muscle spasm of back: Secondary | ICD-10-CM | POA: Diagnosis not present

## 2022-08-27 ENCOUNTER — Ambulatory Visit: Payer: Medicare PPO | Admitting: Pulmonary Disease

## 2022-08-27 ENCOUNTER — Encounter: Payer: Self-pay | Admitting: Pulmonary Disease

## 2022-08-27 VITALS — BP 112/70 | HR 62 | Temp 97.8°F | Ht 63.0 in | Wt 143.0 lb

## 2022-08-27 DIAGNOSIS — J4489 Other specified chronic obstructive pulmonary disease: Secondary | ICD-10-CM

## 2022-08-27 DIAGNOSIS — J439 Emphysema, unspecified: Secondary | ICD-10-CM | POA: Diagnosis not present

## 2022-08-27 DIAGNOSIS — R0602 Shortness of breath: Secondary | ICD-10-CM | POA: Diagnosis not present

## 2022-08-27 DIAGNOSIS — Z87891 Personal history of nicotine dependence: Secondary | ICD-10-CM

## 2022-08-27 MED ORDER — ALBUTEROL SULFATE HFA 108 (90 BASE) MCG/ACT IN AERS: 2.0000 | INHALATION_SPRAY | Freq: Four times a day (QID) | RESPIRATORY_TRACT | 2 refills | Status: DC | PRN

## 2022-08-27 NOTE — Progress Notes (Signed)
Subjective:    Patient ID: Savannah Cox, female    DOB: Aug 22, 1945, 77 y.o.   MRN: [id number] Patient Care Team: Einar Pheasant, MD as PCP - General (Internal Medicine)  Chief Complaint  Patient presents with   Follow-up    Some SOB with exertion. No wheezing. Cough with yellowish sputum.    HPI Savannah Cox is a 76 year old former smoker who follows for the issue of COPD with chronic bronchitis.  This is a scheduled visit. Last seen here 28 February 2022.  She continues to use Trelegy Ellipta consistently.  She notes that this medication helps her and she has not had any flareups/exacerbations of bronchitis/COPD since her last visit.  She continues to do well with the Trelegy.  Some mild nasal congestion and postnasal drip over the last few days due to change in weather/allergies.  Claritin helps with this.  She remains active at the [facility] and does treadmill workouts and can do over a mile on the treadmill without shortness of breath. She has not had any fevers, chills or sweats.  No hemoptysis.  No orthopnea, paroxysmal nocturnal dyspnea, chest pain, calf pain or tenderness. No malaise. Overall she feels well and looks well.    She has run out of albuterol rescue inhaler.    She has a very remote former smoker therefore it does not qualify for lung cancer screening due to having quit cigarettes over 20 years ago.   DATA: 01/30/2020 PFTs: FEV1 1.99 L or 94% predicted, FVC 2.40 L or 85% predicted, no bronchodilator response. FEV1/FVC 83%.  Lung volumes are low end of normal.  Diffusion capacity mildly reduced 06/26/2021 PFTs: FEV1 2.09 L or 104% predicted, FVC 2.39 L or 90% predicted, FEV1/FVC 87%, no bronchodilator response.  Lung volumes at low end normal, small airways component.  Diffusion capacity mildly reduced.  Consistent with very mild COPD  Review of Systems A 10 point review of systems was performed and it is as noted above otherwise negative.  Patient Active Problem List    Diagnosis Date Noted   Fatigue 02/05/2022   Leg cramps 07/14/2021   Kidney cysts 03/20/2021   Chest pain 02/14/2021   Monocytosis 02/14/2021   Abnormal liver function test 02/14/2021   Back pain 10/11/2020   Centrilobular emphysema ([place]) 02/13/2020   Former smoker 02/13/2020   Sleeping difficulties 01/22/2020   Leukopenia 09/12/2019   SOB (shortness of breath) 06/06/2019   Chest tightness 03/20/2019   Osteoporosis 07/12/2018   Bilateral hip pain 06/12/2018   Aortic atherosclerosis ([place]) 10/10/2017   Finger pain, right 07/05/2017   Acute colitis 04/17/2016   Diarrhea 04/17/2016   Hypokalemia 04/17/2016   Headache 04/17/2016   GIB (gastrointestinal bleeding) 04/14/2016   History of colonic polyps 01/31/2016   Change in bowel movement 10/21/2015   Pain of both thumbs 01/14/2015   Right hip pain 01/10/2015   Health care maintenance 09/04/2014   Abnormal mammogram 09/25/2013   Abdominal pain 02/22/2013   Anemia 05/01/2012   GERD (gastroesophageal reflux disease) 05/01/2012   Hematuria 05/01/2012   Hypothyroidism 05/01/2012   Hypercholesterolemia 05/01/2012   Hypertension 05/01/2012   Social History   Tobacco Use   Smoking status: Former    Packs/day: 1.50    Years: 30.00    Total pack years: 45.00    Types: Cigarettes    Quit date: 1990    Years since quitting: 34.2   Smokeless tobacco: Never  Substance Use Topics   Alcohol use: Yes    Alcohol/week:  0.0 standard drinks of alcohol    Comment: Rare drink, one drink per month   Allergies  Allergen Reactions   Augmentin [Amoxicillin-Pot Clavulanate] Rash    Has patient had a PCN reaction causing immediate rash, facial/tongue/throat swelling, SOB or lightheadedness with hypotension: No Has patient had a PCN reaction causing severe rash involving mucus membranes or skin necrosis: No Has patient had a PCN reaction that required hospitalization: No Has patient had a PCN reaction occurring within the last 10 years: No If  all of the above answers are "NO", then may proceed with Cephalosporin use.    Current Meds  Medication Sig   Fluticasone-Umeclidin-Vilant (TRELEGY ELLIPTA) 100-62.5-25 MCG/ACT AEPB Inhale 1 puff into the lungs daily.   levothyroxine (SYNTHROID) 75 MCG tablet Take 1 tablet (75 mcg total) by mouth daily.   lisinopril (ZESTRIL) 10 MG tablet Take 1 tablet (10 mg total) by mouth daily.   loratadine (CLARITIN) 10 MG tablet Take 10 mg by mouth daily.    Multiple Vitamins-Minerals (CENTRUM SILVER ULTRA WOMENS PO) Take 1 tablet by mouth daily.    omeprazole (PRILOSEC) 40 MG capsule TAKE 1 CAPSULE (40 MG TOTAL) BY MOUTH ONCE DAILY TAKE 30 MINS BEFORE A MEAL (Patient taking differently: TAKE 1 CAPSULE (40 MG TOTAL) BY MOUTH AS NEEDED TAKE 30 MINS BEFORE A MEAL)   rosuvastatin (CRESTOR) 20 MG tablet Take 1 tablet (20 mg total) by mouth daily.   sertraline (ZOLOFT) 50 MG tablet Take 1 tablet (50 mg total) by mouth daily.   Immunization History  Administered Date(s) Administered   DTaP 12/09/2010   Fluad Quad(high Dose 65+) 02/14/2021, 03/01/2022   Influenza Split 04/29/2012   Influenza, High Dose Seasonal PF 02/19/2016, 03/19/2017, 06/03/2018, 02/28/2020   Influenza,inj,Quad PF,6+ Mos 02/21/2013, 02/13/2014, 05/14/2015   Influenza-Unspecified 02/16/2019   PFIZER(Purple Top)SARS-COV-2 Vaccination 07/31/2019, 08/23/2019, 04/05/2020   Pneumococcal Conjugate-13 04/28/2013   Pneumococcal Polysaccharide-23 02/13/2014   Respiratory Syncytial Virus Vaccine,Recomb Aduvanted(Arexvy) 03/31/2022   Tdap 03/01/2022   Zoster Recombinat (Shingrix) 02/01/2019, 05/16/2019   Zoster, Live 10/12/2011        Objective:   Physical Exam BP 112/70 (BP Location: Left Arm, Cuff Size: Normal)   Pulse 62   Temp 97.8 F (36.6 C)   Ht '5\' 3"'$  (1.6 m)   Wt 143 lb (64.9 kg)   SpO2 96%   BMI 25.33 kg/m   SpO2: 96 % O2 Device: None (Room air)  GENERAL: Well-developed, well-nourished, no acute distress.  Fully  ambulatory.  No conversational dyspnea.   HEAD: Normocephalic, atraumatic.  EYES: Pupils equal, round, reactive to light.  No scleral icterus.  MOUTH: Chipped teeth, caps on some teeth. NECK: Supple. No thyromegaly. Trachea midline. No JVD.  No adenopathy. PULMONARY: Good air entry bilaterally.  No adventitious sounds. CARDIOVASCULAR: S1 and S2. Regular rate and rhythm.  No rubs, murmurs or gallops heard. ABDOMEN: Benign. MUSCULOSKELETAL: No joint deformity, no clubbing, no edema.  NEUROLOGIC: No focal deficit, no gait disturbance, speech is fluent. SKIN: Intact,warm,dry. PSYCH: Mood and behavior normal.      Assessment & Plan:     ICD-10-CM   1. COPD with chronic bronchitis and emphysema ([place])  J44.89    J43.9    Continue Trelegy Albuterol for as needed use Well compensated    2. SOB (shortness of breath)  R06.02    Has not been an issue on Trelegy Continue same    3. Former smoker - remote  [phone]    No evidence of relapse  Meds ordered this encounter  Medications   albuterol (VENTOLIN HFA) 108 (90 Base) MCG/ACT inhaler    Sig: Inhale 2 puffs into the lungs every 6 (six) hours as needed.    Dispense:  8 g    Refill:  2   Patient is to continue Trelegy as she is doing.  We have refilled her albuterol inhaler prescription.  Will see her in follow-up in a year's time she is to contact us prior to that time should any new difficulties arise.  Renold Don, MD Advanced Bronchoscopy PCCM [facility]    *This note was dictated using voice recognition software/Dragon.  Despite best efforts to proofread, errors can occur which can change the meaning. Any transcriptional errors that result from this process are unintentional and may not be fully corrected at the time of dictation.

## 2022-08-27 NOTE — Patient Instructions (Signed)
We have sent in a prescription for an emergency inhaler just in case you needed.  This is always good to keep on hand.  Continue your Trelegy 1 puff daily.  We will see you in follow-up in 1 years time call sooner should any new problems arise.

## 2022-09-22 DIAGNOSIS — M9905 Segmental and somatic dysfunction of pelvic region: Secondary | ICD-10-CM | POA: Diagnosis not present

## 2022-09-22 DIAGNOSIS — M9903 Segmental and somatic dysfunction of lumbar region: Secondary | ICD-10-CM | POA: Diagnosis not present

## 2022-09-22 DIAGNOSIS — M62830 Muscle spasm of back: Secondary | ICD-10-CM | POA: Diagnosis not present

## 2022-09-22 DIAGNOSIS — M5136 Other intervertebral disc degeneration, lumbar region: Secondary | ICD-10-CM | POA: Diagnosis not present

## 2022-10-20 DIAGNOSIS — M62830 Muscle spasm of back: Secondary | ICD-10-CM | POA: Diagnosis not present

## 2022-10-20 DIAGNOSIS — M9905 Segmental and somatic dysfunction of pelvic region: Secondary | ICD-10-CM | POA: Diagnosis not present

## 2022-10-20 DIAGNOSIS — M9903 Segmental and somatic dysfunction of lumbar region: Secondary | ICD-10-CM | POA: Diagnosis not present

## 2022-10-20 DIAGNOSIS — M5136 Other intervertebral disc degeneration, lumbar region: Secondary | ICD-10-CM | POA: Diagnosis not present

## 2022-10-21 ENCOUNTER — Telehealth: Payer: Self-pay | Admitting: Internal Medicine

## 2022-10-21 NOTE — Telephone Encounter (Signed)
Contacted Savannah Cox to schedule their annual wellness visit. Appointment made for 11/03/2022.  Verlee Rossetti; Care Guide Ambulatory Clinical Support [facility] Direct Dial: [phone]

## 2022-11-03 ENCOUNTER — Ambulatory Visit (INDEPENDENT_AMBULATORY_CARE_PROVIDER_SITE_OTHER): Payer: Medicare PPO

## 2022-11-03 ENCOUNTER — Telehealth: Payer: Self-pay

## 2022-11-03 DIAGNOSIS — Z0000 Encounter for general adult medical examination without abnormal findings: Secondary | ICD-10-CM

## 2022-11-03 DIAGNOSIS — Z1231 Encounter for screening mammogram for malignant neoplasm of breast: Secondary | ICD-10-CM

## 2022-11-03 NOTE — Patient Instructions (Signed)

## 2022-11-03 NOTE — Progress Notes (Signed)
I connected with  Telisa Chastain on 11/03/22 by a audio enabled telemedicine application and verified that I am speaking with the correct person using two identifiers.  Patient Location: Home  Provider Location: Home Office  I discussed the limitations of evaluation and management by telemedicine. The patient expressed understanding and agreed to proceed.   Subjective:   Savannah Cox is a 77 y.o. female who presents for Medicare Annual (Subsequent) preventive examination.  Review of Systems    Cardiac Risk Factors include: advanced age (>77men, >69 women);female gender, Hypertension, and Hypercholesterolemia.          Objective:       08/27/2022    8:40 AM 06/05/2022    8:26 AM 02/28/2022    8:25 AM  Vitals with BMI  Height 5\' 3"  5\' 3"  5\' 3"   Weight 143 lbs 143 lbs 3 oz 142 lbs 10 oz  BMI 25.34 25.37 25.27  Systolic 112 130 161  Diastolic 70 62 70  Pulse 62 68 83    There were no vitals filed for this visit. There is no height or weight on file to calculate BMI.     11/03/2022    2:30 PM 01/06/2022   10:18 AM 10/30/2021   11:37 AM 10/29/2020   10:37 AM 10/27/2019   10:57 AM 08/16/2018    2:38 PM 03/22/2018   10:16 AM  Advanced Directives  Does Patient Have a Medical Advance Directive? Yes Yes Yes No No No No  Type of Estate agent of [place];Living will Healthcare Power of [place];Living will Healthcare Power of [place];Living will      Does patient want to make changes to medical advance directive? No - Patient declined  No - Patient declined      Copy of Healthcare Power of Attorney in Chart?   No - copy requested      Would patient like information on creating a medical advance directive?    No - Patient declined Yes (MAU/Ambulatory/Procedural Areas - Information given) No - Patient declined No - Patient declined    Current Medications (verified) Outpatient Encounter Medications as of 11/03/2022  Medication Sig   albuterol (VENTOLIN  HFA) 108 (90 Base) MCG/ACT inhaler Inhale 2 puffs into the lungs every 6 (six) hours as needed.   Fluticasone-Umeclidin-Vilant (TRELEGY ELLIPTA) 100-62.5-25 MCG/ACT AEPB Inhale 1 puff into the lungs daily.   levothyroxine (SYNTHROID) 75 MCG tablet Take 1 tablet (75 mcg total) by mouth daily.   lisinopril (ZESTRIL) 10 MG tablet Take 1 tablet (10 mg total) by mouth daily.   loratadine (CLARITIN) 10 MG tablet Take 10 mg by mouth daily.    Multiple Vitamins-Minerals (CENTRUM SILVER ULTRA WOMENS PO) Take 1 tablet by mouth daily.    omeprazole (PRILOSEC) 40 MG capsule TAKE 1 CAPSULE (40 MG TOTAL) BY MOUTH ONCE DAILY TAKE 30 MINS BEFORE A MEAL (Patient taking differently: TAKE 1 CAPSULE (40 MG TOTAL) BY MOUTH AS NEEDED TAKE 30 MINS BEFORE A MEAL)   rosuvastatin (CRESTOR) 20 MG tablet Take 1 tablet (20 mg total) by mouth daily. (Patient taking differently: Take 20 mg by mouth every other day.)   sertraline (ZOLOFT) 50 MG tablet Take 1 tablet (50 mg total) by mouth daily.   No facility-administered encounter medications on file as of 11/03/2022.    Allergies (verified) Augmentin [amoxicillin-pot clavulanate]   History: Past Medical History:  Diagnosis Date   Allergic state    Anemia    Complication of anesthesia  Diverticulosis    GERD (gastroesophageal reflux disease)    History of hiatal hernia    History of kidney stones    Hypercholesterolemia    Hypertension    Hypothyroidism    Osteoporosis, postmenopausal    PONV (postoperative nausea and vomiting)    nauseated with colonoscopy   Shingles    Past Surgical History:  Procedure Laterality Date   BREAST BIOPSY Left 90s   benign   COLONOSCOPY WITH PROPOFOL N/A 01/28/2016   Procedure: COLONOSCOPY WITH PROPOFOL;  Surgeon: Christena Deem, MD;  Location: [facility] ENDOSCOPY;  Service: Endoscopy;  Laterality: N/A;   EXCISION METACARPAL MASS Right 04/30/2017   Procedure: EXCISION MUCOID CYST FIFTH FINGER RIGHT HAND;  Surgeon: Kennedy Bucker,  MD;  Location: [facility] ORS;  Service: Orthopedics;  Laterality: Right;   EYE SURGERY Bilateral 2010   cataracts   Family History  Problem Relation Age of Onset   Heart disease Father        died age 16 - myocardial infarction   Hypercholesterolemia Mother    Arthritis Mother    Heart attack Mother    Kidney cancer Other        aunt   Breast cancer Maternal Aunt    Colon cancer Neg Hx    Social History   Socioeconomic History   Marital status: Married    Spouse name: Not on file   Number of children: 3   Years of education: 16   Highest education level: Not on file  Occupational History   Occupation: retired Magazine features editor: RETIRED  Tobacco Use   Smoking status: Former    Packs/day: 1.50    Years: 30.00    Additional pack years: 0.00    Total pack years: 45.00    Types: Cigarettes    Quit date: 1990    Years since quitting: 34.4   Smokeless tobacco: Never  Vaping Use   Vaping Use: Never used  Substance and Sexual Activity   Alcohol use: Yes    Alcohol/week: 0.0 standard drinks of alcohol    Comment: Rare drink, one drink per month   Drug use: No   Sexual activity: Yes  Other Topics Concern   Not on file  Social History Narrative   Not on file   Social Determinants of Health   Financial Resource Strain: Low Risk  (11/03/2022)   Overall Financial Resource Strain (CARDIA)    Difficulty of Paying Living Expenses: Not hard at all  Food Insecurity: No Food Insecurity (11/03/2022)   Hunger Vital Sign    Worried About Running Out of Food in the Last Year: Never true    Ran Out of Food in the Last Year: Never true  Transportation Needs: No Transportation Needs (11/03/2022)   PRAPARE - Administrator, Civil Service (Medical): No    Lack of Transportation (Non-Medical): No  Physical Activity: Sufficiently Active (11/03/2022)   Exercise Vital Sign    Days of Exercise per Week: 6 days    Minutes of Exercise per Session: 40 min  Stress: No Stress Concern  Present (11/03/2022)   [employer] of Occupational Health - Occupational Stress Questionnaire    Feeling of Stress : Not at all  Social Connections: Socially Integrated (11/03/2022)   Social Connection and Isolation Panel [NHANES]    Frequency of Communication with Friends and Family: More than three times a week    Frequency of Social Gatherings with Friends and Family: More than three times a  week    Attends Religious Services: More than 4 times per year    Active Member of Clubs or Organizations: Yes    Attends Banker Meetings: More than 4 times per year    Marital Status: Married    Tobacco Counseling Counseling given: No   Clinical Intake:  Pre-visit preparation completed: No  Pain : No/denies pain     Nutritional Status: BMI of 19-24  Normal Nutritional Risks: None Diabetes: No  How often do you need to have someone help you when you read instructions, pamphlets, or other written materials from your doctor or pharmacy?: 1 - Never  Diabetic?No  Interpreter Needed?: No  Information entered by :: Laurel Dimmer, CMA   Activities of Daily Living    11/03/2022    1:09 PM 10/30/2022    9:24 AM  In your present state of health, do you have any difficulty performing the following activities:  Hearing? 0 0  Vision? 0 0  Comment wear glasses, [facility]   Difficulty concentrating or making decisions? 0 0  Walking or climbing stairs? 0 0  Dressing or bathing? 0 0  Doing errands, shopping? 0 0  Preparing Food and eating ?  N  Using the Toilet?  N  In the past six months, have you accidently leaked urine?  Y  Do you have problems with loss of bowel control?  N  Managing your Medications?  N  Managing your Finances?  N  Housekeeping or managing your Housekeeping?  N    Patient Care Team: Dale [place], MD as PCP - General (Internal Medicine) Salena Saner, MD as Consulting Physician (Pulmonary Disease)  Indicate any recent  Medical Services you may have received from other than [facility] providers in the past year (date may be approximate).     Assessment:   This is a routine wellness examination for Savannah Cox.   Hearing/Vision screen Denies any hearing issues. Denies any change to her vision. Wear glasses. Annual Eye Exam, [facility].   Dietary issues and exercise activities discussed: Current Exercise Habits: Structured exercise class, Type of exercise: walking, Time (Minutes): 40, Frequency (Times/Week): 6, Weekly Exercise (Minutes/Week): 240, Intensity: Moderate, Exercise limited by: None identified   Goals Addressed   None    Depression Screen    11/03/2022    1:08 PM 06/05/2022    8:27 AM 02/05/2022    9:05 AM 11/05/2021    9:57 AM 10/30/2021   11:35 AM 04/02/2021    8:25 AM 02/14/2021    1:40 PM  PHQ 2/9 Scores  PHQ - 2 Score 0 0 0 0 0 0 0    Fall Risk    11/03/2022    1:08 PM 10/30/2022    9:24 AM 06/05/2022    8:26 AM 02/05/2022    9:05 AM 11/05/2021    9:57 AM  Fall Risk   Falls in the past year? 0 0 0 0 0  Number falls in past yr: 0  0 0   Injury with Fall? 0  0 0   Risk for fall due to : No Fall Risks  No Fall Risks No Fall Risks No Fall Risks  Follow up Falls evaluation completed  Falls evaluation completed Falls evaluation completed Falls evaluation completed    FALL RISK PREVENTION PERTAINING TO THE HOME:  Any stairs in or around the home? Yes  If so, are there any without handrails? No  Home free of loose throw rugs in walkways,  pet beds, electrical cords, etc? Yes  Adequate lighting in your home to reduce risk of falls? Yes   ASSISTIVE DEVICES UTILIZED TO PREVENT FALLS:  Life alert? Yes Use of a cane, walker or w/c? No  Grab bars in the bathroom? Yes  Shower chair or bench in shower? Yes  Elevated toilet seat or a handicapped toilet? Yes   TIMED UP AND GO: Unable to perform, virtual appointment    Cognitive Function:    10/27/2019   11:00 AM 03/22/2018   10:22  AM 03/19/2017    8:49 AM 03/17/2016    9:55 AM  MMSE - Mini Mental State Exam  Not completed: Unable to complete     Orientation to time  5 5 5   Orientation to Place  5 5 5   Registration  3 3 3   Attention/ Calculation  5 5 5   Recall  3 3 3   Language- name 2 objects  2 2 2   Language- repeat  1 1 1   Language- follow 3 step command  3 3 3   Language- read & follow direction  1 1 1   Write a sentence  1 1 1   Copy design  1 1 1   Total score  30 30 30         11/03/2022    1:13 PM  6CIT Screen  What Year? 0 points  What month? 0 points  What time? 0 points  Count back from 20 0 points  Months in reverse 0 points  Repeat phrase 0 points  Total Score 0 points    Immunizations Immunization History  Administered Date(s) Administered   DTaP 12/09/2010   Fluad Quad(high Dose 65+) 02/14/2021, 03/01/2022   Influenza Split 04/29/2012   Influenza, High Dose Seasonal PF 02/19/2016, 03/19/2017, 06/03/2018, 02/28/2020   Influenza,inj,Quad PF,6+ Mos 02/21/2013, 02/13/2014, 05/14/2015   Influenza-Unspecified 02/16/2019   PFIZER(Purple Top)SARS-COV-2 Vaccination 07/31/2019, 08/23/2019, 04/05/2020   Pneumococcal Conjugate-13 04/28/2013   Pneumococcal Polysaccharide-23 02/13/2014   Respiratory Syncytial Virus Vaccine,Recomb Aduvanted(Arexvy) 03/31/2022   Tdap 03/01/2022   Zoster Recombinat (Shingrix) 02/01/2019, 05/16/2019   Zoster, Live 10/12/2011    TDAP status: Up to date  Flu Vaccine status: Up to date  Pneumococcal vaccine status: Up to date  Covid-19 vaccine status: Information provided on how to obtain vaccines.   Qualifies for Shingles Vaccine? Yes   Zostavax completed Yes   Shingrix Completed?: Yes  Screening Tests Health Maintenance  Topic Date Due   COLONOSCOPY (Pts 45-30yrs Insurance coverage will need to be confirmed)  01/27/2021   COVID-19 Vaccine (4 - 2023-24 season) 02/14/2022   MAMMOGRAM  10/26/2022   INFLUENZA VACCINE  01/15/2023   Medicare Annual Wellness  (AWV)  11/03/2023   DTaP/Tdap/Td (3 - Td or Tdap) 03/01/2032   Pneumonia Vaccine 8+ Years old  Completed   DEXA SCAN  Completed   Hepatitis C Screening  Completed   Zoster Vaccines- Shingrix  Completed   HPV VACCINES  Aged Out    Health Maintenance  Health Maintenance Due  Topic Date Due   COLONOSCOPY (Pts 45-70yrs Insurance coverage will need to be confirmed)  01/27/2021   COVID-19 Vaccine (4 - 2023-24 season) 02/14/2022   MAMMOGRAM  10/26/2022    Colorectal Cancer Screening: The pt declined scheduling at this time. She stated that she will follow up with her Gastrologist to see if she needs to schedule an colonoscopy.  Mammogram status: Ordered 11/03/22. Pt provided with contact info and advised to call to schedule appt.   DEXA Scan:  06/23/2018  Lung Cancer Screening: (Low Dose CT Chest recommended if Age 16-80 years, 30 pack-year currently smoking OR have quit w/in 15years.) does not qualify.   Lung Cancer Screening Referral: not applicable   Additional Screening:  Hepatitis C Screening: does qualify; Completed 02/19/2016  Vision Screening: Recommended annual ophthalmology exams for early detection of glaucoma and other disorders of the eye. Is the patient up to date with their annual eye exam?  Yes  Who is the provider or what is the name of the office in which the patient attends annual eye exams? [facility]  If pt is not established with a provider, would they like to be referred to a provider to establish care? No .   Dental Screening: Recommended annual dental exams for proper oral hygiene  Community Resource Referral / Chronic Care Management: CRR required this visit?  No   CCM required this visit?  No      Plan:     I have personally reviewed and noted the following in the patient's chart:   Medical and social history Use of alcohol, tobacco or illicit drugs  Current medications and supplements including opioid prescriptions. Patient is not  currently taking opioid prescriptions. Functional ability and status Nutritional status Physical activity Advanced directives List of other physicians Hospitalizations, surgeries, and ER visits in previous 12 months Vitals Screenings to include cognitive, depression, and falls Referrals and appointments  In addition, I have reviewed and discussed with patient certain preventive protocols, quality metrics, and best practice recommendations. A written personalized care plan for preventive services as well as general preventive health recommendations were provided to patient.   Savannah Cox , Thank you for taking time to come for your Medicare Wellness Visit. I appreciate your ongoing commitment to your health goals. Please review the following plan we discussed and let me know if I can assist you in the future.   These are the goals we discussed:  Goals      Increase physical activity     Stay active; monitoring steps with goal of 6,000 steps daily Stay hydrated Maintain weight        This is a list of the screening recommended for you and due dates:  Health Maintenance  Topic Date Due   Colon Cancer Screening  01/27/2021   COVID-19 Vaccine (4 - 2023-24 season) 02/14/2022   Mammogram  10/26/2022   Flu Shot  01/15/2023   Medicare Annual Wellness Visit  11/03/2023   DTaP/Tdap/Td vaccine (3 - Td or Tdap) 03/01/2032   Pneumonia Vaccine  Completed   DEXA scan (bone density measurement)  Completed   Hepatitis C Screening: USPSTF Recommendation to screen - Ages 68-79 yo.  Completed   Zoster (Shingles) Vaccine  Completed   HPV Vaccine  Aged [address], [place]   11/03/2022   Nurse Notes: Approximately 30 minute Non-Face -To-Face Medicare Wellness Visit. The pt would like to lower her dosage of Omeprazole from 40 to 20 MG daily. She also complains of some intermittent muscle pain that she was noticing. She spoke with her pharmacist, and they informed her it could be from the  Crestor. She is taking the Crestor 20MG  every other day instead of every day. FYI: The patient has an upcoming appointment on 11/11/22. I told her If she doesn't hear anything back from me that you can discuss this with her at her upcoming appointment.

## 2022-11-03 NOTE — Telephone Encounter (Signed)
The patient had a Virtual AWV appointment today. She would like to lower her dosage of Omeprazole from 40 to 20 MG daily. She also complains of a history of intermittent muscle pain that she attributed to her statin medication. She discussed her concerns with her pharmacist who confirmed that it could be causing muscle pain and the patient stated the pharmacist recommended taking it every other day until she came into the office. Lorain Childes: The patient has an upcoming appointment on 11/11/22. I told her If she doesn't hear anything back from me that you can discuss this with her at her upcoming appointment.

## 2022-11-04 NOTE — Telephone Encounter (Signed)
I notified the patient that she can decrease her omeprazole to 20 MG and also take the cholesterol medication every other day  per Dr. Lorin Picket. She verbalize understanding.

## 2022-11-04 NOTE — Telephone Encounter (Signed)
I am ok to decrease omeprazole to 20mg  q day.  Also, ok with qod dosing of cholesterol medication.  Let us know if any persistent problems.

## 2022-11-05 ENCOUNTER — Ambulatory Visit
Admission: RE | Admit: 2022-11-05 | Discharge: 2022-11-05 | Disposition: A | Discharge status: Home / Self Care | Payer: Medicare PPO | Source: Ambulatory Visit | Attending: Internal Medicine | Admitting: Internal Medicine

## 2022-11-05 DIAGNOSIS — Z1231 Encounter for screening mammogram for malignant neoplasm of breast: Secondary | ICD-10-CM | POA: Insufficient documentation

## 2022-11-06 ENCOUNTER — Other Ambulatory Visit (INDEPENDENT_AMBULATORY_CARE_PROVIDER_SITE_OTHER): Payer: Medicare PPO

## 2022-11-06 DIAGNOSIS — I10 Essential (primary) hypertension: Secondary | ICD-10-CM

## 2022-11-06 DIAGNOSIS — D649 Anemia, unspecified: Secondary | ICD-10-CM | POA: Diagnosis not present

## 2022-11-06 DIAGNOSIS — E7800 Pure hypercholesterolemia, unspecified: Secondary | ICD-10-CM | POA: Diagnosis not present

## 2022-11-06 LAB — CBC WITH DIFFERENTIAL/PLATELET
Basophils Absolute: 0.1 10*3/uL (ref 0.0–0.1)
Basophils Relative: 1.9 % (ref 0.0–3.0)
Eosinophils Absolute: 0.1 10*3/uL (ref 0.0–0.7)
Eosinophils Relative: 2.8 % (ref 0.0–5.0)
HCT: 41.8 % (ref 36.0–46.0)
Hemoglobin: 13.5 g/dL (ref 12.0–15.0)
Lymphocytes Relative: 24.7 % (ref 12.0–46.0)
Lymphs Abs: 1.1 10*3/uL (ref 0.7–4.0)
MCHC: 32.4 g/dL (ref 30.0–36.0)
MCV: 98.7 fl (ref 78.0–100.0)
Monocytes Absolute: 0.7 10*3/uL (ref 0.1–1.0)
Monocytes Relative: 14.8 % — ABNORMAL HIGH (ref 3.0–12.0)
Neutro Abs: 2.6 10*3/uL (ref 1.4–7.7)
Neutrophils Relative %: 55.8 % (ref 43.0–77.0)
Platelets: 210 10*3/uL (ref 150.0–400.0)
RBC: 4.24 Mil/uL (ref 3.87–5.11)
RDW: 13.8 % (ref 11.5–15.5)
WBC: 4.6 10*3/uL (ref 4.0–10.5)

## 2022-11-06 LAB — LIPID PANEL
Cholesterol: 188 mg/dL (ref 0–200)
HDL: 73.9 mg/dL (ref >39.00)
LDL Cholesterol: 98 mg/dL (ref 0–99)
NonHDL: 113.64
Total CHOL/HDL Ratio: 3
Triglycerides: 79 mg/dL (ref 0.0–149.0)
VLDL: 15.8 mg/dL (ref 0.0–40.0)

## 2022-11-06 LAB — HEPATIC FUNCTION PANEL
ALT: 27 U/L (ref 0–35)
AST: 41 U/L — ABNORMAL HIGH (ref 0–37)
Albumin: 3.9 g/dL (ref 3.5–5.2)
Alkaline Phosphatase: 52 U/L (ref 39–117)
Bilirubin, Direct: 0.1 mg/dL (ref 0.0–0.3)
Total Bilirubin: 0.4 mg/dL (ref 0.2–1.2)
Total Protein: 6.4 g/dL (ref 6.0–8.3)

## 2022-11-06 LAB — BASIC METABOLIC PANEL
BUN: 12 mg/dL (ref 6–23)
CO2: 29 mEq/L (ref 19–32)
Calcium: 9 mg/dL (ref 8.4–10.5)
Chloride: 104 mEq/L (ref 96–112)
Creatinine, Ser: 0.79 mg/dL (ref 0.40–1.20)
GFR: 73.42 mL/min (ref >60.00)
Glucose, Bld: 95 mg/dL (ref 70–99)
Potassium: 3.6 mEq/L (ref 3.5–5.1)
Sodium: 137 mEq/L (ref 135–145)

## 2022-11-11 ENCOUNTER — Encounter: Payer: Self-pay | Admitting: Internal Medicine

## 2022-11-11 ENCOUNTER — Ambulatory Visit (INDEPENDENT_AMBULATORY_CARE_PROVIDER_SITE_OTHER): Payer: Medicare PPO | Admitting: Internal Medicine

## 2022-11-11 VITALS — BP 118/70 | HR 66 | Temp 97.9°F | Resp 16 | Ht 63.0 in | Wt 140.0 lb

## 2022-11-11 DIAGNOSIS — R198 Other specified symptoms and signs involving the digestive system and abdomen: Secondary | ICD-10-CM

## 2022-11-11 DIAGNOSIS — Z0000 Encounter for general adult medical examination without abnormal findings: Secondary | ICD-10-CM | POA: Diagnosis not present

## 2022-11-11 DIAGNOSIS — J432 Centrilobular emphysema: Secondary | ICD-10-CM

## 2022-11-11 DIAGNOSIS — E2839 Other primary ovarian failure: Secondary | ICD-10-CM | POA: Diagnosis not present

## 2022-11-11 DIAGNOSIS — E7800 Pure hypercholesterolemia, unspecified: Secondary | ICD-10-CM | POA: Diagnosis not present

## 2022-11-11 DIAGNOSIS — K219 Gastro-esophageal reflux disease without esophagitis: Secondary | ICD-10-CM

## 2022-11-11 DIAGNOSIS — I700 Atherosclerosis of aorta: Secondary | ICD-10-CM

## 2022-11-11 DIAGNOSIS — Z86010 Personal history of colonic polyps: Secondary | ICD-10-CM

## 2022-11-11 DIAGNOSIS — R7989 Other specified abnormal findings of blood chemistry: Secondary | ICD-10-CM

## 2022-11-11 DIAGNOSIS — I10 Essential (primary) hypertension: Secondary | ICD-10-CM

## 2022-11-11 DIAGNOSIS — M810 Age-related osteoporosis without current pathological fracture: Secondary | ICD-10-CM

## 2022-11-11 DIAGNOSIS — E039 Hypothyroidism, unspecified: Secondary | ICD-10-CM

## 2022-11-11 MED ORDER — OMEPRAZOLE 20 MG PO CPDR
20.0000 mg | DELAYED_RELEASE_CAPSULE | Freq: Every day | ORAL | 1 refills | Status: DC
Start: 2022-11-11 — End: 2023-07-31

## 2022-11-11 NOTE — Assessment & Plan Note (Signed)
Physical today 11/11/22. PAP 04/2017 - negative with negative HPV.  Mammogram 11/05/22 - Birads I.  Colonoscopy 2021. Due bone density.

## 2022-11-11 NOTE — Progress Notes (Signed)
Subjective:    Patient ID: Savannah Cox, female    DOB: [birth year], 77 y.o.   MRN: [id number]  Patient here for  Chief Complaint  Patient presents with   Annual Exam    HPI Here for a physical exam.  Evaluated by Dr Jayme Cloud 08/27/22 - f/u COPD and chronic bronchitis.  Doing well with trelegy. Breathing stable.  Going to the [facility] - exercising. Saw cardioogy 07/02/22 - routine f/u.  No changes made - stable.  Saw GI 12/31/21 - f/u - likely IBS - recommended daily fiber.  We discussed benefiber last visit. Bowels are doing better.  Takes benefiber prn. S/p PFT.  With acid reflux. Noticed increased when off omeprazole.  Was trying to take TUMS.  Feels needs to be on PPI scheduled.  No vomiting or abdominal pain reported.  Last 02/2020. Overdue f/u bone density.    Past Medical History:  Diagnosis Date   Allergic state    Anemia    Complication of anesthesia    Diverticulosis    GERD (gastroesophageal reflux disease)    History of hiatal hernia    History of kidney stones    Hypercholesterolemia    Hypertension    Hypothyroidism    Osteoporosis, postmenopausal    PONV (postoperative nausea and vomiting)    nauseated with colonoscopy   Shingles    Past Surgical History:  Procedure Laterality Date   BREAST BIOPSY Left 90s   benign   COLONOSCOPY WITH PROPOFOL N/A 01/28/2016   Procedure: COLONOSCOPY WITH PROPOFOL;  Surgeon: Christena Deem, MD;  Location: [facility] ENDOSCOPY;  Service: Endoscopy;  Laterality: N/A;   EXCISION METACARPAL MASS Right 04/30/2017   Procedure: EXCISION MUCOID CYST FIFTH FINGER RIGHT HAND;  Surgeon: Kennedy Bucker, MD;  Location: [facility] ORS;  Service: Orthopedics;  Laterality: Right;   EYE SURGERY Bilateral 2010   cataracts   Family History  Problem Relation Age of Onset   Heart disease Father        died age 76 - myocardial infarction   Hypercholesterolemia Mother    Arthritis Mother    Heart attack Mother    Kidney cancer Other        aunt   Breast  cancer Maternal Aunt    Colon cancer Neg Hx    Social History   Socioeconomic History   Marital status: Married    Spouse name: Not on file   Number of children: 3   Years of education: 16   Highest education level: Not on file  Occupational History   Occupation: retired Magazine features editor: RETIRED  Tobacco Use   Smoking status: Former    Packs/day: 1.50    Years: 30.00    Additional pack years: 0.00    Total pack years: 45.00    Types: Cigarettes    Quit date: 1990    Years since quitting: 34.4   Smokeless tobacco: Never  Vaping Use   Vaping Use: Never used  Substance and Sexual Activity   Alcohol use: Yes    Alcohol/week: 0.0 standard drinks of alcohol    Comment: Rare drink, one drink per month   Drug use: No   Sexual activity: Yes  Other Topics Concern   Not on file  Social History Narrative   Not on file   Social Determinants of Health   Financial Resource Strain: Low Risk  (11/03/2022)   Overall Financial Resource Strain (CARDIA)    Difficulty of Paying Living Expenses: Not hard  at all  Food Insecurity: No Food Insecurity (11/03/2022)   Hunger Vital Sign    Worried About Running Out of Food in the Last Year: Never true    Ran Out of Food in the Last Year: Never true  Transportation Needs: No Transportation Needs (11/03/2022)   PRAPARE - Administrator, Civil Service (Medical): No    Lack of Transportation (Non-Medical): No  Physical Activity: Sufficiently Active (11/03/2022)   Exercise Vital Sign    Days of Exercise per Week: 6 days    Minutes of Exercise per Session: 40 min  Stress: No Stress Concern Present (11/03/2022)   [employer] of Occupational Health - Occupational Stress Questionnaire    Feeling of Stress : Not at all  Social Connections: Socially Integrated (11/03/2022)   Social Connection and Isolation Panel [NHANES]    Frequency of Communication with Friends and Family: More than three times a week    Frequency of Social  Gatherings with Friends and Family: More than three times a week    Attends Religious Services: More than 4 times per year    Active Member of [employer] or Organizations: Yes    Attends Engineer, structural: More than 4 times per year    Marital Status: Married     Review of Systems  Constitutional:  Negative for appetite change and unexpected weight change.  HENT:  Negative for congestion, sinus pressure and sore throat.   Eyes:  Negative for pain and visual disturbance.  Respiratory:  Negative for cough, chest tightness and shortness of breath.   Cardiovascular:  Negative for chest pain and palpitations.  Gastrointestinal:  Negative for abdominal pain, diarrhea, nausea and vomiting.       Reflux as outlined. Bowels better.   Genitourinary:  Negative for difficulty urinating and dysuria.  Musculoskeletal:  Negative for joint swelling and myalgias.  Skin:  Negative for color change and rash.  Neurological:  Negative for dizziness and headaches.  Hematological:  Negative for adenopathy. Does not bruise/bleed easily.  Psychiatric/Behavioral:  Negative for agitation and dysphoric mood.        Objective:     BP 118/70   Pulse 66   Temp 97.9 F (36.6 C)   Resp 16   Ht 5\' 3"  (1.6 m)   Wt 140 lb (63.5 kg)   SpO2 97%   BMI 24.80 kg/m  Wt Readings from Last 3 Encounters:  11/11/22 140 lb (63.5 kg)  08/27/22 143 lb (64.9 kg)  06/05/22 143 lb 3.2 oz (65 kg)    Physical Exam Vitals reviewed.  Constitutional:      General: She is not in acute distress.    Appearance: Normal appearance. She is well-developed.  HENT:     Head: Normocephalic and atraumatic.     Right Ear: External ear normal.     Left Ear: External ear normal.  Eyes:     General: No scleral icterus.       Right eye: No discharge.        Left eye: No discharge.     Conjunctiva/sclera: Conjunctivae normal.  Neck:     Thyroid: No thyromegaly.  Cardiovascular:     Rate and Rhythm: Normal rate and regular  rhythm.  Pulmonary:     Effort: No tachypnea, accessory muscle usage or respiratory distress.     Breath sounds: Normal breath sounds. No decreased breath sounds or wheezing.  Chest:  Breasts:    Right: No inverted nipple, mass, nipple discharge  or tenderness (no axillary adenopathy).     Left: No inverted nipple, mass, nipple discharge or tenderness (no axilarry adenopathy).  Abdominal:     General: Bowel sounds are normal.     Palpations: Abdomen is soft.     Tenderness: There is no abdominal tenderness.  Musculoskeletal:        General: No swelling or tenderness.     Cervical back: Neck supple.  Lymphadenopathy:     Cervical: No cervical adenopathy.  Skin:    Findings: No erythema or rash.  Neurological:     Mental Status: She is alert and oriented to person, place, and time.  Psychiatric:        Mood and Affect: Mood normal.        Behavior: Behavior normal.      Outpatient Encounter Medications as of 11/11/2022  Medication Sig   omeprazole (PRILOSEC) 20 MG capsule Take 1 capsule (20 mg total) by mouth daily.   albuterol (VENTOLIN HFA) 108 (90 Base) MCG/ACT inhaler Inhale 2 puffs into the lungs every 6 (six) hours as needed.   Fluticasone-Umeclidin-Vilant (TRELEGY ELLIPTA) 100-62.5-25 MCG/ACT AEPB Inhale 1 puff into the lungs daily.   levothyroxine (SYNTHROID) 75 MCG tablet Take 1 tablet (75 mcg total) by mouth daily.   lisinopril (ZESTRIL) 10 MG tablet Take 1 tablet (10 mg total) by mouth daily.   loratadine (CLARITIN) 10 MG tablet Take 10 mg by mouth daily.    Multiple Vitamins-Minerals (CENTRUM SILVER ULTRA WOMENS PO) Take 1 tablet by mouth daily.    rosuvastatin (CRESTOR) 20 MG tablet Take 1 tablet (20 mg total) by mouth daily. (Patient taking differently: Take 20 mg by mouth every other day.)   sertraline (ZOLOFT) 50 MG tablet Take 1 tablet (50 mg total) by mouth daily.   [DISCONTINUED] omeprazole (PRILOSEC) 40 MG capsule TAKE 1 CAPSULE (40 MG TOTAL) BY MOUTH ONCE DAILY  TAKE 30 MINS BEFORE A MEAL (Patient taking differently: TAKE 1 CAPSULE (40 MG TOTAL) BY MOUTH AS NEEDED TAKE 30 MINS BEFORE A MEAL)   No facility-administered encounter medications on file as of 11/11/2022.     Lab Results  Component Value Date   WBC 4.6 11/06/2022   HGB 13.5 11/06/2022   HCT 41.8 11/06/2022   PLT 210.0 11/06/2022   GLUCOSE 95 11/06/2022   CHOL 188 11/06/2022   TRIG 79.0 11/06/2022   HDL 73.90 11/06/2022   LDLDIRECT 165.9 05/11/2013   LDLCALC 98 11/06/2022   ALT 27 11/06/2022   AST 41 (H) 11/06/2022   NA 137 11/06/2022   K 3.6 11/06/2022   CL 104 11/06/2022   CREATININE 0.79 11/06/2022   BUN 12 11/06/2022   CO2 29 11/06/2022   TSH 1.78 02/05/2022   INR 0.96 04/14/2016    MM 3D SCREENING MAMMOGRAM BILATERAL BREAST  Result Date: 11/06/2022 CLINICAL DATA:  Screening. EXAM: DIGITAL SCREENING BILATERAL MAMMOGRAM WITH TOMOSYNTHESIS AND CAD TECHNIQUE: Bilateral screening digital craniocaudal and mediolateral oblique mammograms were obtained. Bilateral screening digital breast tomosynthesis was performed. The images were evaluated with computer-aided detection. COMPARISON:  Previous exam(s). ACR Breast Density Category b: There are scattered areas of fibroglandular density. FINDINGS: There are no findings suspicious for malignancy. IMPRESSION: No mammographic evidence of malignancy. A result letter of this screening mammogram will be mailed directly to the patient. RECOMMENDATION: Screening mammogram in one year. (Code:SM-B-01Y) BI-RADS CATEGORY  1: Negative. Electronically Signed   By: Frederico Hamman M.D.   On: 11/06/2022 12:38       Assessment &  Plan:  Health care maintenance Assessment & Plan: Physical today 11/11/22. PAP 04/2017 - negative with negative HPV.  Mammogram 11/05/22 - Birads I.  Colonoscopy 2021. Due bone density.    Estrogen deficiency -     DG Bone Density; Future  Abnormal liver function test Assessment & Plan: Abdominal ultrasound - no liver  abnormality.  Recent liver panel wnl.    Aortic atherosclerosis (HCC) Assessment & Plan: Continue crestor.    Centrilobular emphysema (HCC) Assessment & Plan: Evaluated by pulmonary as outlined.   Continue trelegy.     Change in bowel movement Assessment & Plan: Doing better.  Benefiber.  Saw GI 12/31/21 - f/u - likely IBS - recommended daily fiber.   Gastroesophageal reflux disease, unspecified whether esophagitis present Assessment & Plan: Restart daily prilosec 20mg  q day.  Follow.   Avoid possible triggers.  Notify if persistent symptoms.    History of colonic polyps Assessment & Plan: Colonoscopy 08/04/19- Dr Doy Hutching- normal colon. TI and random biopsies negative.    Hypercholesterolemia Assessment & Plan: Continue crestor.  Low cholesterol diet and exercise.  Follow lipid panel and liver function tests.    Orders: -     Lipid panel; Future -     Hepatic function panel; Future  Primary hypertension Assessment & Plan: Continue lisinopril.  Blood pressure doing well.  Follow pressure.  Follow metabolic panel.   Orders: -     Basic metabolic panel; Future  Hypothyroidism, unspecified type Assessment & Plan: On thyroid replacement.  Follow tsh.    Orders: -     TSH; Future  Osteoporosis without current pathological fracture, unspecified osteoporosis type Assessment & Plan: Reclast.  Followed by endocrinology. Due bone density.    Other orders -     Omeprazole; Take 1 capsule (20 mg total) by mouth daily.  Dispense: 90 capsule; Refill: 1     Dale [place], MD

## 2022-11-16 ENCOUNTER — Encounter: Payer: Self-pay | Admitting: Internal Medicine

## 2022-11-16 NOTE — Assessment & Plan Note (Signed)
Continue crestor 

## 2022-11-16 NOTE — Assessment & Plan Note (Signed)
Colonoscopy 08/04/19- Dr Doy Hutching- normal colon. TI and random biopsies negative.

## 2022-11-16 NOTE — Assessment & Plan Note (Signed)
Abdominal ultrasound - no liver abnormality.  Recent liver panel wnl.  

## 2022-11-16 NOTE — Assessment & Plan Note (Signed)
Restart daily prilosec 20mg  q day.  Follow.   Avoid possible triggers.  Notify if persistent symptoms.

## 2022-11-16 NOTE — Assessment & Plan Note (Signed)
On thyroid replacement.  Follow tsh.  

## 2022-11-16 NOTE — Assessment & Plan Note (Signed)
Evaluated by pulmonary as outlined.   Continue trelegy.   

## 2022-11-16 NOTE — Assessment & Plan Note (Signed)
Doing better.  Benefiber.  Saw GI 12/31/21 - f/u - likely IBS - recommended daily fiber.

## 2022-11-16 NOTE — Assessment & Plan Note (Signed)
Reclast.  Followed by endocrinology. Due bone density.

## 2022-11-16 NOTE — Assessment & Plan Note (Signed)
Continue lisinopril.  Blood pressure doing well.  Follow pressure.  Follow metabolic panel.  °

## 2022-11-16 NOTE — Assessment & Plan Note (Signed)
Continue crestor.  Low cholesterol diet and exercise.  Follow lipid panel and liver function tests.  

## 2022-11-17 DIAGNOSIS — M9905 Segmental and somatic dysfunction of pelvic region: Secondary | ICD-10-CM | POA: Diagnosis not present

## 2022-11-17 DIAGNOSIS — M9903 Segmental and somatic dysfunction of lumbar region: Secondary | ICD-10-CM | POA: Diagnosis not present

## 2022-11-17 DIAGNOSIS — M5136 Other intervertebral disc degeneration, lumbar region: Secondary | ICD-10-CM | POA: Diagnosis not present

## 2022-11-17 DIAGNOSIS — M62830 Muscle spasm of back: Secondary | ICD-10-CM | POA: Diagnosis not present

## 2022-11-27 ENCOUNTER — Other Ambulatory Visit: Payer: Self-pay | Admitting: Internal Medicine

## 2022-12-15 DIAGNOSIS — M9903 Segmental and somatic dysfunction of lumbar region: Secondary | ICD-10-CM | POA: Diagnosis not present

## 2022-12-15 DIAGNOSIS — M9905 Segmental and somatic dysfunction of pelvic region: Secondary | ICD-10-CM | POA: Diagnosis not present

## 2022-12-15 DIAGNOSIS — M62830 Muscle spasm of back: Secondary | ICD-10-CM | POA: Diagnosis not present

## 2022-12-15 DIAGNOSIS — M5136 Other intervertebral disc degeneration, lumbar region: Secondary | ICD-10-CM | POA: Diagnosis not present

## 2022-12-29 ENCOUNTER — Ambulatory Visit
Admission: RE | Admit: 2022-12-29 | Discharge: 2022-12-29 | Disposition: A | Discharge status: Home / Self Care | Payer: Medicare PPO | Source: Ambulatory Visit | Attending: Internal Medicine | Admitting: Internal Medicine

## 2022-12-29 DIAGNOSIS — M810 Age-related osteoporosis without current pathological fracture: Secondary | ICD-10-CM | POA: Diagnosis not present

## 2022-12-29 DIAGNOSIS — E2839 Other primary ovarian failure: Secondary | ICD-10-CM | POA: Diagnosis not present

## 2022-12-29 DIAGNOSIS — Z780 Asymptomatic menopausal state: Secondary | ICD-10-CM | POA: Diagnosis not present

## 2023-01-04 ENCOUNTER — Other Ambulatory Visit: Payer: Self-pay | Admitting: Internal Medicine

## 2023-01-06 DIAGNOSIS — R1032 Left lower quadrant pain: Secondary | ICD-10-CM | POA: Diagnosis not present

## 2023-01-06 DIAGNOSIS — K581 Irritable bowel syndrome with constipation: Secondary | ICD-10-CM | POA: Diagnosis not present

## 2023-01-06 DIAGNOSIS — K219 Gastro-esophageal reflux disease without esophagitis: Secondary | ICD-10-CM | POA: Diagnosis not present

## 2023-01-19 DIAGNOSIS — M5136 Other intervertebral disc degeneration, lumbar region: Secondary | ICD-10-CM | POA: Diagnosis not present

## 2023-01-19 DIAGNOSIS — M9903 Segmental and somatic dysfunction of lumbar region: Secondary | ICD-10-CM | POA: Diagnosis not present

## 2023-01-19 DIAGNOSIS — M62830 Muscle spasm of back: Secondary | ICD-10-CM | POA: Diagnosis not present

## 2023-01-19 DIAGNOSIS — M9905 Segmental and somatic dysfunction of pelvic region: Secondary | ICD-10-CM | POA: Diagnosis not present

## 2023-01-29 ENCOUNTER — Encounter (INDEPENDENT_AMBULATORY_CARE_PROVIDER_SITE_OTHER): Payer: Self-pay

## 2023-01-29 ENCOUNTER — Ambulatory Visit: Payer: Self-pay

## 2023-01-29 NOTE — Patient Outreach (Signed)
  Care Coordination   Initial Visit Note   01/29/2023 Name: Savannah Cox MRN: [id number] DOB: [birth year]  Savannah Cox is a 77 y.o. year old female who sees Dale Gentry, MD for primary care. I spoke with  Olin Pia by phone today.  What matters to the patients health and wellness today?  CM educated patient on [facility] services.  Patient declines services and feels that she is able to manage her medical needs.  Patient agreed to contact her provider in the future if she needs [facility] services.    Goals Addressed   None     SDOH assessments and interventions completed:  No     Care Coordination Interventions:  Yes, provided   Interventions Today    Flowsheet Row Most Recent Value  General Interventions   General Interventions Discussed/Reviewed General Interventions Discussed, Doctor Visits  [Reviewed upcoming appointment with provider, no concerns with transportation, food or rx]  Education Interventions   Education Provided Provided Education  [Educated on [facility]]        Follow up plan: No further intervention required.   Encounter Outcome:  Pt. Visit Completed

## 2023-02-09 DIAGNOSIS — R011 Cardiac murmur, unspecified: Secondary | ICD-10-CM | POA: Diagnosis not present

## 2023-02-09 DIAGNOSIS — R9431 Abnormal electrocardiogram [ECG] [EKG]: Secondary | ICD-10-CM | POA: Diagnosis not present

## 2023-02-09 DIAGNOSIS — I700 Atherosclerosis of aorta: Secondary | ICD-10-CM | POA: Diagnosis not present

## 2023-02-09 DIAGNOSIS — J432 Centrilobular emphysema: Secondary | ICD-10-CM | POA: Diagnosis not present

## 2023-02-09 DIAGNOSIS — R0602 Shortness of breath: Secondary | ICD-10-CM | POA: Diagnosis not present

## 2023-02-09 DIAGNOSIS — R0609 Other forms of dyspnea: Secondary | ICD-10-CM | POA: Diagnosis not present

## 2023-02-13 DIAGNOSIS — R899 Unspecified abnormal finding in specimens from other organs, systems and tissues: Secondary | ICD-10-CM | POA: Diagnosis not present

## 2023-02-23 DIAGNOSIS — M9905 Segmental and somatic dysfunction of pelvic region: Secondary | ICD-10-CM | POA: Diagnosis not present

## 2023-02-23 DIAGNOSIS — M62830 Muscle spasm of back: Secondary | ICD-10-CM | POA: Diagnosis not present

## 2023-02-23 DIAGNOSIS — M9903 Segmental and somatic dysfunction of lumbar region: Secondary | ICD-10-CM | POA: Diagnosis not present

## 2023-02-23 DIAGNOSIS — M5136 Other intervertebral disc degeneration, lumbar region: Secondary | ICD-10-CM | POA: Diagnosis not present

## 2023-02-26 DIAGNOSIS — R011 Cardiac murmur, unspecified: Secondary | ICD-10-CM | POA: Diagnosis not present

## 2023-02-26 DIAGNOSIS — R0609 Other forms of dyspnea: Secondary | ICD-10-CM | POA: Diagnosis not present

## 2023-03-02 DIAGNOSIS — H0012 Chalazion right lower eyelid: Secondary | ICD-10-CM | POA: Diagnosis not present

## 2023-03-12 ENCOUNTER — Other Ambulatory Visit (INDEPENDENT_AMBULATORY_CARE_PROVIDER_SITE_OTHER): Payer: Medicare PPO

## 2023-03-12 DIAGNOSIS — E039 Hypothyroidism, unspecified: Secondary | ICD-10-CM | POA: Diagnosis not present

## 2023-03-12 DIAGNOSIS — I10 Essential (primary) hypertension: Secondary | ICD-10-CM

## 2023-03-12 DIAGNOSIS — E7800 Pure hypercholesterolemia, unspecified: Secondary | ICD-10-CM

## 2023-03-12 LAB — HEPATIC FUNCTION PANEL
ALT: 30 U/L (ref 0–35)
AST: 39 U/L — ABNORMAL HIGH (ref 0–37)
Albumin: 4 g/dL (ref 3.5–5.2)
Alkaline Phosphatase: 54 U/L (ref 39–117)
Bilirubin, Direct: 0.1 mg/dL (ref 0.0–0.3)
Total Bilirubin: 0.5 mg/dL (ref 0.2–1.2)
Total Protein: 6.7 g/dL (ref 6.0–8.3)

## 2023-03-12 LAB — LIPID PANEL
Cholesterol: 193 mg/dL (ref 0–200)
HDL: 83.9 mg/dL (ref >39.00)
LDL Cholesterol: 95 mg/dL (ref 0–99)
NonHDL: 109.41
Total CHOL/HDL Ratio: 2
Triglycerides: 71 mg/dL (ref 0.0–149.0)
VLDL: 14.2 mg/dL (ref 0.0–40.0)

## 2023-03-12 LAB — BASIC METABOLIC PANEL
BUN: 17 mg/dL (ref 6–23)
CO2: 26 mEq/L (ref 19–32)
Calcium: 9.3 mg/dL (ref 8.4–10.5)
Chloride: 108 mEq/L (ref 96–112)
Creatinine, Ser: 0.77 mg/dL (ref 0.40–1.20)
GFR: 75.53 mL/min (ref >60.00)
Glucose, Bld: 95 mg/dL (ref 70–99)
Potassium: 4 mEq/L (ref 3.5–5.1)
Sodium: 141 mEq/L (ref 135–145)

## 2023-03-12 LAB — TSH: TSH: 1.25 u[IU]/mL (ref 0.35–5.50)

## 2023-03-16 ENCOUNTER — Ambulatory Visit: Payer: Medicare PPO | Admitting: Internal Medicine

## 2023-03-23 ENCOUNTER — Other Ambulatory Visit: Payer: Self-pay | Admitting: Pulmonary Disease

## 2023-03-23 DIAGNOSIS — M9903 Segmental and somatic dysfunction of lumbar region: Secondary | ICD-10-CM | POA: Diagnosis not present

## 2023-03-23 DIAGNOSIS — M62830 Muscle spasm of back: Secondary | ICD-10-CM | POA: Diagnosis not present

## 2023-03-23 DIAGNOSIS — M5136 Other intervertebral disc degeneration, lumbar region with discogenic back pain only: Secondary | ICD-10-CM | POA: Diagnosis not present

## 2023-03-23 DIAGNOSIS — M9905 Segmental and somatic dysfunction of pelvic region: Secondary | ICD-10-CM | POA: Diagnosis not present

## 2023-03-30 ENCOUNTER — Telehealth: Payer: Self-pay | Admitting: Internal Medicine

## 2023-03-30 ENCOUNTER — Ambulatory Visit: Payer: Medicare PPO | Admitting: Internal Medicine

## 2023-03-30 ENCOUNTER — Encounter: Payer: Self-pay | Admitting: Internal Medicine

## 2023-03-30 VITALS — BP 120/72 | HR 68 | Temp 97.9°F | Resp 16 | Ht 63.0 in | Wt 139.0 lb

## 2023-03-30 DIAGNOSIS — J432 Centrilobular emphysema: Secondary | ICD-10-CM

## 2023-03-30 DIAGNOSIS — K219 Gastro-esophageal reflux disease without esophagitis: Secondary | ICD-10-CM | POA: Diagnosis not present

## 2023-03-30 DIAGNOSIS — I10 Essential (primary) hypertension: Secondary | ICD-10-CM

## 2023-03-30 DIAGNOSIS — I700 Atherosclerosis of aorta: Secondary | ICD-10-CM | POA: Diagnosis not present

## 2023-03-30 DIAGNOSIS — R7989 Other specified abnormal findings of blood chemistry: Secondary | ICD-10-CM | POA: Diagnosis not present

## 2023-03-30 DIAGNOSIS — E039 Hypothyroidism, unspecified: Secondary | ICD-10-CM

## 2023-03-30 DIAGNOSIS — M810 Age-related osteoporosis without current pathological fracture: Secondary | ICD-10-CM | POA: Diagnosis not present

## 2023-03-30 DIAGNOSIS — E7800 Pure hypercholesterolemia, unspecified: Secondary | ICD-10-CM | POA: Diagnosis not present

## 2023-03-30 NOTE — Assessment & Plan Note (Signed)
Continue crestor

## 2023-03-30 NOTE — Progress Notes (Signed)
Subjective:    Patient ID: Savannah Cox, female    DOB: Feb 17, 1946, 77 y.o.   MRN: [id number]  Patient here for  Chief Complaint  Patient presents with   Medical Management of Chronic Issues    HPI Here for a scheduled follow up. Evaluated by Dr Jayme Cloud 08/27/22 - f/u COPD and chronic bronchitis. Doing well with trelegy. Breathing stable. S/p PFPT - helpful - no significant fecal incontinence. Had f/u appt with GI 01/06/23 - LLQ pain.  Recommended restart psylliium and daily stool softener.  She reports she is not needing stool softener regularly.  She feels bowels are stable. No abdominal pain now. Saw cardiology 02/09/23 - recommended repeat echo. ECHO 02/26/23 - normal LV function with mild MR and mild TR.  Stays active.  No chest pain or sob reported.  No cough or congestion.  Overall feeling good.    Past Medical History:  Diagnosis Date   Allergic state    Anemia    Complication of anesthesia    Diverticulosis    GERD (gastroesophageal reflux disease)    History of hiatal hernia    History of kidney stones    Hypercholesterolemia    Hypertension    Hypothyroidism    Osteoporosis, postmenopausal    PONV (postoperative nausea and vomiting)    nauseated with colonoscopy   Shingles    Past Surgical History:  Procedure Laterality Date   BREAST BIOPSY Left 90s   benign   COLONOSCOPY WITH PROPOFOL N/A 01/28/2016   Procedure: COLONOSCOPY WITH PROPOFOL;  Surgeon: Christena Deem, MD;  Location: [facility];  Service: Endoscopy;  Laterality: N/A;   EXCISION METACARPAL MASS Right 04/30/2017   Procedure: EXCISION MUCOID CYST FIFTH FINGER RIGHT HAND;  Surgeon: Kennedy Bucker, MD;  Location: [facility];  Service: Orthopedics;  Laterality: Right;   EYE SURGERY Bilateral 2010   cataracts   Family History  Problem Relation Age of Onset   Heart disease Father        died age 64 - myocardial infarction   Hypercholesterolemia Mother    Arthritis Mother    Heart attack Mother     Kidney cancer Other        aunt   Breast cancer Maternal Aunt    Colon cancer Neg Hx    Social History   Socioeconomic History   Marital status: Married    Spouse name: Not on file   Number of children: 3   Years of education: 16   Highest education level: Bachelor's degree (e.g., BA, AB, BS)  Occupational History   Occupation: retired Magazine features editor: RETIRED  Tobacco Use   Smoking status: Former    Current packs/day: 0.00    Average packs/day: 1.5 packs/day for 30.0 years (45.0 ttl pk-yrs)    Types: Cigarettes    Start date: 73    Quit date: 1990    Years since quitting: 34.8   Smokeless tobacco: Never  Vaping Use   Vaping status: Never Used  Substance and Sexual Activity   Alcohol use: Yes    Alcohol/week: 0.0 standard drinks of alcohol    Comment: Rare drink, one drink per month   Drug use: No   Sexual activity: Yes  Other Topics Concern   Not on file  Social History Narrative   Not on file   Social Determinants of Health   Financial Resource Strain: Low Risk  (03/29/2023)   Overall Financial Resource Strain (CARDIA)    Difficulty of  Paying Living Expenses: Not hard at all  Food Insecurity: No Food Insecurity (03/29/2023)   Hunger Vital Sign    Worried About Running Out of Food in the Last Year: Never true    Ran Out of Food in the Last Year: Never true  Transportation Needs: No Transportation Needs (03/29/2023)   PRAPARE - Administrator, Civil Service (Medical): No    Lack of Transportation (Non-Medical): No  Physical Activity: Sufficiently Active (03/29/2023)   Exercise Vital Sign    Days of Exercise per Week: 5 days    Minutes of Exercise per Session: 30 min  Stress: No Stress Concern Present (03/29/2023)   [employer] of Occupational Health - Occupational Stress Questionnaire    Feeling of Stress : Only a little  Social Connections: Socially Integrated (03/29/2023)   Social Connection and Isolation Panel [NHANES]    Frequency  of Communication with Friends and Family: More than three times a week    Frequency of Social Gatherings with Friends and Family: Three times a week    Attends Religious Services: More than 4 times per year    Active Member of Clubs or Organizations: Yes    Attends Banker Meetings: 1 to 4 times per year    Marital Status: Married     Review of Systems  Constitutional:  Negative for appetite change and unexpected weight change.  HENT:  Negative for congestion and sinus pressure.   Respiratory:  Negative for cough, chest tightness and shortness of breath.   Cardiovascular:  Negative for chest pain, palpitations and leg swelling.  Gastrointestinal:  Negative for abdominal pain, diarrhea, nausea and vomiting.  Genitourinary:  Negative for difficulty urinating and dysuria.  Musculoskeletal:  Negative for joint swelling and myalgias.  Skin:  Negative for color change and rash.  Neurological:  Negative for dizziness and headaches.  Psychiatric/Behavioral:  Negative for agitation and dysphoric mood.        Objective:     BP 120/72   Pulse 68   Temp 97.9 F (36.6 C)   Resp 16   Ht 5\' 3"  (1.6 m)   Wt 139 lb (63 kg)   SpO2 97%   BMI 24.62 kg/m  Wt Readings from Last 3 Encounters:  03/30/23 139 lb (63 kg)  11/11/22 140 lb (63.5 kg)  08/27/22 143 lb (64.9 kg)    Physical Exam Vitals reviewed.  Constitutional:      General: She is not in acute distress.    Appearance: Normal appearance.  HENT:     Head: Normocephalic and atraumatic.     Right Ear: External ear normal.     Left Ear: External ear normal.  Eyes:     General: No scleral icterus.       Right eye: No discharge.        Left eye: No discharge.     Conjunctiva/sclera: Conjunctivae normal.  Neck:     Thyroid: No thyromegaly.  Cardiovascular:     Rate and Rhythm: Normal rate and regular rhythm.  Pulmonary:     Effort: No respiratory distress.     Breath sounds: Normal breath sounds. No wheezing.   Abdominal:     General: Bowel sounds are normal.     Palpations: Abdomen is soft.     Tenderness: There is no abdominal tenderness.  Musculoskeletal:        General: No swelling or tenderness.     Cervical back: Neck supple. No tenderness.  Lymphadenopathy:  Cervical: No cervical adenopathy.  Skin:    Findings: No erythema or rash.  Neurological:     Mental Status: She is alert.  Psychiatric:        Mood and Affect: Mood normal.        Behavior: Behavior normal.      Outpatient Encounter Medications as of 03/30/2023  Medication Sig   albuterol (VENTOLIN HFA) 108 (90 Base) MCG/ACT inhaler Inhale 2 puffs into the lungs every 6 (six) hours as needed.   levothyroxine (SYNTHROID) 75 MCG tablet Take 1 tablet (75 mcg total) by mouth daily.   lisinopril (ZESTRIL) 10 MG tablet TAKE 1 TABLET BY MOUTH EVERY DAY   loratadine (CLARITIN) 10 MG tablet Take 10 mg by mouth daily.    Multiple Vitamins-Minerals (CENTRUM SILVER ULTRA WOMENS PO) Take 1 tablet by mouth daily.    omeprazole (PRILOSEC) 20 MG capsule Take 1 capsule (20 mg total) by mouth daily.   rosuvastatin (CRESTOR) 20 MG tablet TAKE 1 TABLET BY MOUTH EVERY DAY   sertraline (ZOLOFT) 50 MG tablet Take 1 tablet (50 mg total) by mouth daily.   TRELEGY ELLIPTA 100-62.5-25 MCG/ACT AEPB TAKE 1 PUFF BY MOUTH EVERY DAY   No facility-administered encounter medications on file as of 03/30/2023.     Lab Results  Component Value Date   WBC 4.6 11/06/2022   HGB 13.5 11/06/2022   HCT 41.8 11/06/2022   PLT 210.0 11/06/2022   GLUCOSE 95 03/12/2023   CHOL 193 03/12/2023   TRIG 71.0 03/12/2023   HDL 83.90 03/12/2023   LDLDIRECT 165.9 05/11/2013   LDLCALC 95 03/12/2023   ALT 30 03/12/2023   AST 39 (H) 03/12/2023   NA 141 03/12/2023   K 4.0 03/12/2023   CL 108 03/12/2023   CREATININE 0.77 03/12/2023   BUN 17 03/12/2023   CO2 26 03/12/2023   TSH 1.25 03/12/2023   INR 0.96 04/14/2016    DG Bone Density  Result Date:  12/29/2022 EXAM: DUAL X-RAY ABSORPTIOMETRY (DXA) FOR BONE MINERAL DENSITY IMPRESSION: Your patient Savannah Cox completed a BMD test on 12/29/2022 using the [employer] DXA System (software version: 14.10) manufactured by [employer]. The following summarizes the results of our evaluation. Technologist:VLM PATIENT BIOGRAPHICAL: Name: Savannah Cox, Savannah Cox Patient ID: [id number] Birth Date: March 28, 1946 Height: 63.0 in. Gender: Female Exam Date: 12/29/2022 Weight: 140.0 lbs. Indications: Caucasian, Hypothyroid, Height Loss, Postmenopausal Fractures: Treatments: Calcium, Synthroid, Vitamin D DENSITOMETRY RESULTS: Site         Region     Measured Date Measured Age WHO Classification Young Adult T-score BMD         %Change vs. Previous Significant Change (*) AP Spine L1-L4 12/29/2024 75.0 Osteopenia -2.3 0.901 g/cm2 -7.7% Yes AP Spine L1-L4 02/21/2014 66.2 Osteopenia -1.7 0.976 g/cm2 - - Left Forearm Radius 33% 12/29/2022 75.0 Osteoporosis -4.0 0.523 g/cm2 -5.8% - Left Forearm Radius 33% 06/23/2018 70.5 Osteoporosis -3.7 0.555 g/cm2 -11.6% Yes Left Forearm Radius 33% 02/21/2014 66.2 Osteoporosis -2.8 0.628 g/cm2 - - DualFemur Neck Right 12/29/2022 75.0 Osteopenia -1.9 0.775 g/cm2 -2.0% - DualFemur Neck Right 06/23/2018 70.5 Osteopenia -1.8 0.791 g/cm2 -9.1% Yes DualFemur Neck Right 02/21/2014 66.2 Osteopenia -1.2 0.870 g/cm2 - - DualFemur Total Mean 12/29/2022 75.0 Osteopenia -2.1 0.744 g/cm2 -1.2% - DualFemur Total Mean 06/23/2018 70.5 Osteopenia -2.0 0.753 g/cm2 -8.6% Yes DualFemur Total Mean 02/21/2014 66.2 Osteopenia -1.5 0.824 g/cm2 - - ASSESSMENT: The BMD measured at Forearm Radius 33% is 0.523 g/cm2 with a T-score of -4.0. This patient is  considered osteoporotic according to World Health Organization [facility]) criteria. Compared with prior study, there has been significant decrease in the spine. Compared with prior study, there has been no significant change in the total hip. The scan quality is good. World Environmental consultant [facility]) criteria for post-menopausal, Caucasian Women: Normal:                   T-score at or above -1 SD Osteopenia/low bone mass: T-score between -1 and -2.5 SD Osteoporosis:             T-score at or below -2.5 SD RECOMMENDATIONS: 1. All patients should optimize calcium and vitamin D intake. 2. Consider FDA-approved medical therapies in postmenopausal women and men aged 51 years and older, based on the following: a. A hip or vertebral(clinical or morphometric) fracture b. T-score < -2.5 at the femoral neck or spine after appropriate evaluation to exclude secondary causes c. Low bone mass (T-score between -1.0 and -2.5 at the femoral neck or spine) and a 10-year probability of a hip fracture > 3% or a 10-year probability of a major osteoporosis-related fracture > 20% based on the [place]-adapted WHO algorithm 3. Clinician judgment and/or patient preferences may indicate treatment for people with 10-year fracture probabilities above or below these levels FOLLOW-UP: People with diagnosed cases of osteoporosis or at high risk for fracture should have regular bone mineral density tests. For patients eligible for Medicare, routine testing is allowed once every 2 years. The testing frequency can be increased to one year for patients who have rapidly progressing disease, those who are receiving or discontinuing medical therapy to restore bone mass, or have additional risk factors. I have reviewed this report, and agree with the above findings. [facility] Electronically Signed   By: Baird Lyons M.D.   On: 12/29/2022 10:12       Assessment & Plan:  Abnormal liver function test Assessment & Plan: Abdominal ultrasound - no liver abnormality.  Recent liver pane revealed AST 39 stable.  Remainder of liver panel wnl.  Follow. Seeing GI.    Aortic atherosclerosis (HCC) Assessment & Plan: Continue crestor.    Centrilobular emphysema (HCC) Assessment & Plan: Evaluated by pulmonary as  outlined.   Continue trelegy.  Breathing stable.    Gastroesophageal reflux disease, unspecified whether esophagitis present Assessment & Plan: No upper symptoms reported.  Continues prilosec.    Hypercholesterolemia Assessment & Plan: Continue crestor.  Low cholesterol diet and exercise.  Follow lipid panel and liver function tests.     Primary hypertension Assessment & Plan: Continue lisinopril.  Blood pressure doing well.  Follow pressure.  Follow metabolic panel.    Hypothyroidism, unspecified type Assessment & Plan: On thyroid replacement.  Follow tsh.     Osteoporosis without current pathological fracture, unspecified osteoporosis type Assessment & Plan: Has received reclast.  Was followed by endocrinology. Bone density 12/2022 - osteoporosis.  Continue calcium, vitamin D and weight bearing exercise.        Dale Sugarmill Woods, MD

## 2023-03-30 NOTE — Assessment & Plan Note (Signed)
On thyroid replacement.  Follow tsh.  

## 2023-03-30 NOTE — Assessment & Plan Note (Signed)
No upper symptoms reported.  Continues prilosec.

## 2023-03-30 NOTE — Assessment & Plan Note (Signed)
Abdominal ultrasound - no liver abnormality.  Recent liver pane revealed AST 39 stable.  Remainder of liver panel wnl.  Follow. Seeing GI.

## 2023-03-30 NOTE — Assessment & Plan Note (Signed)
Evaluated by pulmonary as outlined.   Continue trelegy.  Breathing stable.

## 2023-03-30 NOTE — Assessment & Plan Note (Signed)
Continue lisinopril.  Blood pressure doing well.  Follow pressure.  Follow metabolic panel.

## 2023-03-30 NOTE — Assessment & Plan Note (Signed)
Continue crestor.  Low cholesterol diet and exercise.  Follow lipid panel and liver function tests.  

## 2023-03-30 NOTE — Assessment & Plan Note (Signed)
Has received reclast.  Was followed by endocrinology. Bone density 12/2022 - osteoporosis.  Continue calcium, vitamin D and weight bearing exercise.

## 2023-03-30 NOTE — Telephone Encounter (Signed)
Please call Savannah Cox and see if she is still seeing endocrinology.  She was seeing them for reclast infusion.  I do not have a recent visit.  If she is agreeable, given bone density with osteoporosis, would like to refer her back to discuss treatment options (reclast, prolia, etc).

## 2023-03-31 NOTE — Telephone Encounter (Signed)
Patient says she is not currently seeing them but is agreeable to go back to discuss treatment with them. Referral placed.

## 2023-03-31 NOTE — Addendum Note (Signed)
Addended by: Rita Ohara D on: 03/31/2023 02:51 PM   Modules accepted: Orders

## 2023-04-04 ENCOUNTER — Other Ambulatory Visit: Payer: Self-pay | Admitting: Internal Medicine

## 2023-04-09 ENCOUNTER — Encounter: Payer: Medicare PPO | Admitting: Dermatology

## 2023-04-15 DIAGNOSIS — M810 Age-related osteoporosis without current pathological fracture: Secondary | ICD-10-CM | POA: Diagnosis not present

## 2023-04-15 DIAGNOSIS — E559 Vitamin D deficiency, unspecified: Secondary | ICD-10-CM | POA: Diagnosis not present

## 2023-04-17 DIAGNOSIS — H00023 Hordeolum internum right eye, unspecified eyelid: Secondary | ICD-10-CM | POA: Diagnosis not present

## 2023-04-20 DIAGNOSIS — M9903 Segmental and somatic dysfunction of lumbar region: Secondary | ICD-10-CM | POA: Diagnosis not present

## 2023-04-20 DIAGNOSIS — M5136 Other intervertebral disc degeneration, lumbar region with discogenic back pain only: Secondary | ICD-10-CM | POA: Diagnosis not present

## 2023-04-20 DIAGNOSIS — M9905 Segmental and somatic dysfunction of pelvic region: Secondary | ICD-10-CM | POA: Diagnosis not present

## 2023-04-20 DIAGNOSIS — M62830 Muscle spasm of back: Secondary | ICD-10-CM | POA: Diagnosis not present

## 2023-05-18 DIAGNOSIS — M9905 Segmental and somatic dysfunction of pelvic region: Secondary | ICD-10-CM | POA: Diagnosis not present

## 2023-05-18 DIAGNOSIS — M62830 Muscle spasm of back: Secondary | ICD-10-CM | POA: Diagnosis not present

## 2023-05-18 DIAGNOSIS — M5136 Other intervertebral disc degeneration, lumbar region with discogenic back pain only: Secondary | ICD-10-CM | POA: Diagnosis not present

## 2023-05-18 DIAGNOSIS — M9903 Segmental and somatic dysfunction of lumbar region: Secondary | ICD-10-CM | POA: Diagnosis not present

## 2023-05-29 DIAGNOSIS — K581 Irritable bowel syndrome with constipation: Secondary | ICD-10-CM | POA: Diagnosis not present

## 2023-06-08 DIAGNOSIS — H00023 Hordeolum internum right eye, unspecified eyelid: Secondary | ICD-10-CM | POA: Diagnosis not present

## 2023-06-08 DIAGNOSIS — H43399 Other vitreous opacities, unspecified eye: Secondary | ICD-10-CM | POA: Diagnosis not present

## 2023-06-08 DIAGNOSIS — Z961 Presence of intraocular lens: Secondary | ICD-10-CM | POA: Diagnosis not present

## 2023-06-13 ENCOUNTER — Other Ambulatory Visit: Payer: Self-pay | Admitting: Internal Medicine

## 2023-06-22 DIAGNOSIS — M5416 Radiculopathy, lumbar region: Secondary | ICD-10-CM | POA: Diagnosis not present

## 2023-06-22 DIAGNOSIS — M5136 Other intervertebral disc degeneration, lumbar region with discogenic back pain only: Secondary | ICD-10-CM | POA: Diagnosis not present

## 2023-06-22 DIAGNOSIS — M9903 Segmental and somatic dysfunction of lumbar region: Secondary | ICD-10-CM | POA: Diagnosis not present

## 2023-06-22 DIAGNOSIS — M5417 Radiculopathy, lumbosacral region: Secondary | ICD-10-CM | POA: Diagnosis not present

## 2023-06-30 DIAGNOSIS — M810 Age-related osteoporosis without current pathological fracture: Secondary | ICD-10-CM | POA: Diagnosis not present

## 2023-07-02 ENCOUNTER — Other Ambulatory Visit: Payer: Self-pay | Admitting: Internal Medicine

## 2023-07-20 ENCOUNTER — Telehealth: Payer: Self-pay | Admitting: Internal Medicine

## 2023-07-20 DIAGNOSIS — M5417 Radiculopathy, lumbosacral region: Secondary | ICD-10-CM | POA: Diagnosis not present

## 2023-07-20 DIAGNOSIS — M5136 Other intervertebral disc degeneration, lumbar region with discogenic back pain only: Secondary | ICD-10-CM | POA: Diagnosis not present

## 2023-07-20 DIAGNOSIS — M5416 Radiculopathy, lumbar region: Secondary | ICD-10-CM | POA: Diagnosis not present

## 2023-07-20 DIAGNOSIS — E7800 Pure hypercholesterolemia, unspecified: Secondary | ICD-10-CM

## 2023-07-20 DIAGNOSIS — M9903 Segmental and somatic dysfunction of lumbar region: Secondary | ICD-10-CM | POA: Diagnosis not present

## 2023-07-20 NOTE — Telephone Encounter (Signed)
 Patient need lab orders.

## 2023-07-22 NOTE — Telephone Encounter (Signed)
 Labs ordered.

## 2023-07-22 NOTE — Addendum Note (Signed)
 Addended by: Victorino Grates D on: 07/22/2023 07:49 AM   Modules accepted: Orders

## 2023-07-28 ENCOUNTER — Other Ambulatory Visit: Payer: Medicare PPO

## 2023-07-28 DIAGNOSIS — E7800 Pure hypercholesterolemia, unspecified: Secondary | ICD-10-CM | POA: Diagnosis not present

## 2023-07-28 LAB — BASIC METABOLIC PANEL
BUN: 17 mg/dL (ref 6–23)
CO2: 30 meq/L (ref 19–32)
Calcium: 9 mg/dL (ref 8.4–10.5)
Chloride: 105 meq/L (ref 96–112)
Creatinine, Ser: 0.81 mg/dL (ref 0.40–1.20)
GFR: 70.89 mL/min (ref >60.00)
Glucose, Bld: 94 mg/dL (ref 70–99)
Potassium: 4 meq/L (ref 3.5–5.1)
Sodium: 141 meq/L (ref 135–145)

## 2023-07-28 LAB — HEPATIC FUNCTION PANEL
ALT: 34 U/L (ref 0–35)
AST: 43 U/L — ABNORMAL HIGH (ref 0–37)
Albumin: 4 g/dL (ref 3.5–5.2)
Alkaline Phosphatase: 54 U/L (ref 39–117)
Bilirubin, Direct: 0.1 mg/dL (ref 0.0–0.3)
Total Bilirubin: 0.5 mg/dL (ref 0.2–1.2)
Total Protein: 6.8 g/dL (ref 6.0–8.3)

## 2023-07-28 LAB — LIPID PANEL
Cholesterol: 198 mg/dL (ref 0–200)
HDL: 85.1 mg/dL (ref >39.00)
LDL Cholesterol: 96 mg/dL (ref 0–99)
NonHDL: 113.21
Total CHOL/HDL Ratio: 2
Triglycerides: 85 mg/dL (ref 0.0–149.0)
VLDL: 17 mg/dL (ref 0.0–40.0)

## 2023-07-31 ENCOUNTER — Encounter: Payer: Self-pay | Admitting: Internal Medicine

## 2023-07-31 ENCOUNTER — Telehealth: Payer: Self-pay | Admitting: Internal Medicine

## 2023-07-31 ENCOUNTER — Ambulatory Visit: Payer: Medicare PPO | Admitting: Internal Medicine

## 2023-07-31 VITALS — BP 120/70 | HR 87 | Temp 98.0°F | Resp 16 | Ht 63.0 in | Wt 142.6 lb

## 2023-07-31 DIAGNOSIS — Z860100 Personal history of colon polyps, unspecified: Secondary | ICD-10-CM

## 2023-07-31 DIAGNOSIS — R7989 Other specified abnormal findings of blood chemistry: Secondary | ICD-10-CM

## 2023-07-31 DIAGNOSIS — I10 Essential (primary) hypertension: Secondary | ICD-10-CM | POA: Diagnosis not present

## 2023-07-31 DIAGNOSIS — M810 Age-related osteoporosis without current pathological fracture: Secondary | ICD-10-CM

## 2023-07-31 DIAGNOSIS — D649 Anemia, unspecified: Secondary | ICD-10-CM | POA: Diagnosis not present

## 2023-07-31 DIAGNOSIS — J432 Centrilobular emphysema: Secondary | ICD-10-CM

## 2023-07-31 DIAGNOSIS — I700 Atherosclerosis of aorta: Secondary | ICD-10-CM

## 2023-07-31 DIAGNOSIS — D72819 Decreased white blood cell count, unspecified: Secondary | ICD-10-CM | POA: Diagnosis not present

## 2023-07-31 DIAGNOSIS — M25551 Pain in right hip: Secondary | ICD-10-CM

## 2023-07-31 DIAGNOSIS — E7800 Pure hypercholesterolemia, unspecified: Secondary | ICD-10-CM | POA: Diagnosis not present

## 2023-07-31 DIAGNOSIS — K219 Gastro-esophageal reflux disease without esophagitis: Secondary | ICD-10-CM

## 2023-07-31 DIAGNOSIS — R109 Unspecified abdominal pain: Secondary | ICD-10-CM | POA: Diagnosis not present

## 2023-07-31 DIAGNOSIS — E039 Hypothyroidism, unspecified: Secondary | ICD-10-CM | POA: Diagnosis not present

## 2023-07-31 NOTE — Progress Notes (Signed)
 Subjective:    Patient ID: Savannah Cox, female    DOB: 04-07-1946, 78 y.o.   MRN: [id number]  Patient here for  Chief Complaint  Patient presents with   Medical Management of Chronic Issues    HPI Here for a scheduled follow up. Evaluated by Dr Jayme Cloud 08/27/22 - f/u COPD and chronic bronchitis. Doing well with trelegy. Breathing stable. S/p PFPT - helpful - no significant fecal incontinence reported today. Had f/u appt with GI 01/06/23 - LLQ pain.  Recommended restart psylliium and daily stool softener. Had f/u with GI 05/29/23 - doing well with IBSC - stable. No changes.  Recommended following LFTs.  Saw cardiology 02/09/23 - recommended repeat echo. ECHO 02/26/23 - normal LV function with mild MR and mild TR. Received reclast 06/30/23. She is trying to stay active. Exercising. She reports increased pain - right hip - lateral hip. Going to chiropractor q month. Given persistence, request referral to Dr Martha Clan. No chest pain or sob reported. Does report intermittent pain - beneath right shoulder blade that extends around to right upper quadrant. Will take TUMS. No known triggers. No vomiting reported. Discussed acid reflux.    Past Medical History:  Diagnosis Date   Allergic state    Anemia    Complication of anesthesia    Diverticulosis    GERD (gastroesophageal reflux disease)    History of hiatal hernia    History of kidney stones    Hypercholesterolemia    Hypertension    Hypothyroidism    Osteoporosis, postmenopausal    PONV (postoperative nausea and vomiting)    nauseated with colonoscopy   Shingles    Past Surgical History:  Procedure Laterality Date   BREAST BIOPSY Left 90s   benign   COLONOSCOPY WITH PROPOFOL N/A 01/28/2016   Procedure: COLONOSCOPY WITH PROPOFOL;  Surgeon: Christena Deem, MD;  Location: [facility] ENDOSCOPY;  Service: Endoscopy;  Laterality: N/A;   EXCISION METACARPAL MASS Right 04/30/2017   Procedure: EXCISION MUCOID CYST FIFTH FINGER RIGHT HAND;   Surgeon: Kennedy Bucker, MD;  Location: [facility] ORS;  Service: Orthopedics;  Laterality: Right;   EYE SURGERY Bilateral 2010   cataracts   Family History  Problem Relation Age of Onset   Heart disease Father        died age 48 - myocardial infarction   Hypercholesterolemia Mother    Arthritis Mother    Heart attack Mother    Kidney cancer Other        aunt   Breast cancer Maternal Aunt    Colon cancer Neg Hx    Social History   Socioeconomic History   Marital status: Married    Spouse name: Not on file   Number of children: 3   Years of education: 16   Highest education level: Bachelor's degree (e.g., BA, AB, BS)  Occupational History   Occupation: retired Magazine features editor: RETIRED  Tobacco Use   Smoking status: Former    Current packs/day: 0.00    Average packs/day: 1.5 packs/day for 30.0 years (45.0 ttl pk-yrs)    Types: Cigarettes    Start date: 66    Quit date: 1990    Years since quitting: 35.1   Smokeless tobacco: Never  Vaping Use   Vaping status: Never Used  Substance and Sexual Activity   Alcohol use: Yes    Alcohol/week: 0.0 standard drinks of alcohol    Comment: Rare drink, one drink per month   Drug use: No  Sexual activity: Yes  Other Topics Concern   Not on file  Social History Narrative   Not on file   Social Drivers of Health   Financial Resource Strain: Low Risk  (07/24/2023)   Overall Financial Resource Strain (CARDIA)    Difficulty of Paying Living Expenses: Not hard at all  Food Insecurity: No Food Insecurity (07/24/2023)   Hunger Vital Sign    Worried About Running Out of Food in the Last Year: Never true    Ran Out of Food in the Last Year: Never true  Transportation Needs: No Transportation Needs (07/24/2023)   PRAPARE - Administrator, Civil Service (Medical): No    Lack of Transportation (Non-Medical): No  Physical Activity: Insufficiently Active (07/24/2023)   Exercise Vital Sign    Days of Exercise per Week: 4 days     Minutes of Exercise per Session: 30 min  Stress: No Stress Concern Present (07/24/2023)   [employer] of Occupational Health - Occupational Stress Questionnaire    Feeling of Stress : Only a little  Social Connections: Socially Integrated (07/24/2023)   Social Connection and Isolation Panel [NHANES]    Frequency of Communication with Friends and Family: Twice a week    Frequency of Social Gatherings with Friends and Family: Twice a week    Attends Religious Services: More than 4 times per year    Active Member of [employer] or Organizations: Yes    Attends Engineer, structural: More than 4 times per year    Marital Status: Married     Review of Systems  Constitutional:  Negative for appetite change and unexpected weight change.  HENT:  Negative for congestion and sinus pressure.   Respiratory:  Negative for cough, chest tightness and shortness of breath.   Cardiovascular:  Negative for chest pain and palpitations.  Gastrointestinal:  Negative for nausea and vomiting.       Right upper quadrant pain as outlined.   Genitourinary:  Negative for difficulty urinating and dysuria.  Musculoskeletal:  Negative for joint swelling and myalgias.  Skin:  Negative for color change and rash.  Neurological:  Negative for dizziness and headaches.  Psychiatric/Behavioral:  Negative for agitation and dysphoric mood.        Objective:     BP 120/70   Pulse 87   Temp 98 F (36.7 C)   Resp 16   Ht 5\' 3"  (1.6 m)   Wt 142 lb 9.6 oz (64.7 kg)   SpO2 98%   BMI 25.26 kg/m  Wt Readings from Last 3 Encounters:  07/31/23 142 lb 9.6 oz (64.7 kg)  03/30/23 139 lb (63 kg)  11/11/22 140 lb (63.5 kg)    Physical Exam Vitals reviewed.  Constitutional:      General: She is not in acute distress.    Appearance: Normal appearance.  HENT:     Head: Normocephalic and atraumatic.     Right Ear: External ear normal.     Left Ear: External ear normal.     Mouth/Throat:     Pharynx: No  oropharyngeal exudate or posterior oropharyngeal erythema.  Eyes:     General: No scleral icterus.       Right eye: No discharge.        Left eye: No discharge.     Conjunctiva/sclera: Conjunctivae normal.  Neck:     Thyroid: No thyromegaly.  Cardiovascular:     Rate and Rhythm: Normal rate and regular rhythm.  Pulmonary:  Effort: No respiratory distress.     Breath sounds: Normal breath sounds. No wheezing.  Abdominal:     General: Bowel sounds are normal.     Palpations: Abdomen is soft.     Tenderness: There is no abdominal tenderness.  Musculoskeletal:        General: No swelling or tenderness.     Cervical back: Neck supple. No tenderness.  Lymphadenopathy:     Cervical: No cervical adenopathy.  Skin:    Findings: No erythema or rash.  Neurological:     Mental Status: She is alert.  Psychiatric:        Mood and Affect: Mood normal.        Behavior: Behavior normal.         Outpatient Encounter Medications as of 07/31/2023  Medication Sig   omeprazole (PRILOSEC) 40 MG capsule Take 40 mg by mouth daily.   levothyroxine (SYNTHROID) 75 MCG tablet TAKE 1 TABLET BY MOUTH EVERY DAY   lisinopril (ZESTRIL) 10 MG tablet TAKE 1 TABLET BY MOUTH EVERY DAY   loratadine (CLARITIN) 10 MG tablet Take 10 mg by mouth daily.    Multiple Vitamins-Minerals (CENTRUM SILVER ULTRA WOMENS PO) Take 1 tablet by mouth daily.    rosuvastatin (CRESTOR) 20 MG tablet TAKE 1 TABLET BY MOUTH EVERY DAY   sertraline (ZOLOFT) 50 MG tablet TAKE 1 TABLET BY MOUTH EVERY DAY   TRELEGY ELLIPTA 100-62.5-25 MCG/ACT AEPB TAKE 1 PUFF BY MOUTH EVERY DAY   [DISCONTINUED] albuterol (VENTOLIN HFA) 108 (90 Base) MCG/ACT inhaler Inhale 2 puffs into the lungs every 6 (six) hours as needed.   [DISCONTINUED] omeprazole (PRILOSEC) 20 MG capsule Take 1 capsule (20 mg total) by mouth daily.   No facility-administered encounter medications on file as of 07/31/2023.     Lab Results  Component Value Date   WBC 4.6  11/06/2022   HGB 13.5 11/06/2022   HCT 41.8 11/06/2022   PLT 210.0 11/06/2022   GLUCOSE 94 07/28/2023   CHOL 198 07/28/2023   TRIG 85.0 07/28/2023   HDL 85.10 07/28/2023   LDLDIRECT 165.9 05/11/2013   LDLCALC 96 07/28/2023   ALT 34 07/28/2023   AST 43 (H) 07/28/2023   NA 141 07/28/2023   K 4.0 07/28/2023   CL 105 07/28/2023   CREATININE 0.81 07/28/2023   BUN 17 07/28/2023   CO2 30 07/28/2023   TSH 1.25 03/12/2023   INR 0.96 04/14/2016    DG Bone Density Result Date: 12/29/2022 EXAM: DUAL X-RAY ABSORPTIOMETRY (DXA) FOR BONE MINERAL DENSITY IMPRESSION: Your patient Monaye Blackie completed a BMD test on 12/29/2022 using the [employer] DXA System (software version: 14.10) manufactured by [employer]. The following summarizes the results of our evaluation. Technologist:VLM PATIENT BIOGRAPHICAL: Name: Ammara, Raj Patient ID: [id number] Birth Date: 02-Apr-1946 Height: 63.0 in. Gender: Female Exam Date: 12/29/2022 Weight: 140.0 lbs. Indications: Caucasian, Hypothyroid, Height Loss, Postmenopausal Fractures: Treatments: Calcium, Synthroid, Vitamin D DENSITOMETRY RESULTS: Site         Region     Measured Date Measured Age WHO Classification Young Adult T-score BMD         %Change vs. Previous Significant Change (*) AP Spine L1-L4 12/29/2022 75.0 Osteopenia -2.3 0.901 g/cm2 -7.7% Yes AP Spine L1-L4 02/21/2014 66.2 Osteopenia -1.7 0.976 g/cm2 - - Left Forearm Radius 33% 12/29/2022 75.0 Osteoporosis -4.0 0.523 g/cm2 -5.8% - Left Forearm Radius 33% 06/23/2018 70.5 Osteoporosis -3.7 0.555 g/cm2 -11.6% Yes Left Forearm Radius 33% 02/21/2014 66.2 Osteoporosis -2.8 0.628 g/cm2 - -  DualFemur Neck Right 12/29/2022 75.0 Osteopenia -1.9 0.775 g/cm2 -2.0% - DualFemur Neck Right 06/23/2018 70.5 Osteopenia -1.8 0.791 g/cm2 -9.1% Yes DualFemur Neck Right 02/21/2014 66.2 Osteopenia -1.2 0.870 g/cm2 - - DualFemur Total Mean 12/29/2022 75.0 Osteopenia -2.1 0.744 g/cm2 -1.2% - DualFemur Total Mean 06/23/2018  70.5 Osteopenia -2.0 0.753 g/cm2 -8.6% Yes DualFemur Total Mean 02/21/2014 66.2 Osteopenia -1.5 0.824 g/cm2 - - ASSESSMENT: The BMD measured at Forearm Radius 33% is 0.523 g/cm2 with a T-score of -4.0. This patient is considered osteoporotic according to World Health Organization [facility]) criteria. Compared with prior study, there has been significant decrease in the spine. Compared with prior study, there has been no significant change in the total hip. The scan quality is good. World Science writer [facility]) criteria for post-menopausal, Caucasian Women: Normal:                   T-score at or above -1 SD Osteopenia/low bone mass: T-score between -1 and -2.5 SD Osteoporosis:             T-score at or below -2.5 SD RECOMMENDATIONS: 1. All patients should optimize calcium and vitamin D intake. 2. Consider FDA-approved medical therapies in postmenopausal women and men aged 21 years and older, based on the following: a. A hip or vertebral(clinical or morphometric) fracture b. T-score < -2.5 at the femoral neck or spine after appropriate evaluation to exclude secondary causes c. Low bone mass (T-score between -1.0 and -2.5 at the femoral neck or spine) and a 10-year probability of a hip fracture > 3% or a 10-year probability of a major osteoporosis-related fracture > 20% based on the [place]-adapted WHO algorithm 3. Clinician judgment and/or patient preferences may indicate treatment for people with 10-year fracture probabilities above or below these levels FOLLOW-UP: People with diagnosed cases of osteoporosis or at high risk for fracture should have regular bone mineral density tests. For patients eligible for Medicare, routine testing is allowed once every 2 years. The testing frequency can be increased to one year for patients who have rapidly progressing disease, those who are receiving or discontinuing medical therapy to restore bone mass, or have additional risk factors. I have reviewed this report, and agree with the  above findings. [facility] Electronically Signed   By: Baird Lyons M.D.   On: 12/29/2022 10:12       Assessment & Plan:  Right hip pain Assessment & Plan: Lateral hip pain as outlined. Persistent intermittent issue. Discussed bursitis, arthritis. Request referral to Dr Martha Clan. Has been to chiropractor. Exercising.   Orders: -     Ambulatory referral to Orthopedic Surgery  Hypercholesterolemia Assessment & Plan: Continue crestor.  Low cholesterol diet and exercise.  Follow lipid panel and liver function tests.    Orders: -     Basic metabolic panel; Future -     Lipid panel; Future -     Hepatic function panel; Future  Anemia, unspecified type Assessment & Plan: Follow cbc.   Orders: -     CBC with Differential/Platelet; Future  Abdominal pain, unspecified abdominal location Assessment & Plan: Describes the intermittent discomfort under right shoulder blade extending to right upper quadrant. Recent AST slightly elevated.  Check abdominal ultrasound. Follow for possible triggers. Continue prilosec.   Orders: -     US Abdomen Complete; Future  Abnormal liver function test Assessment & Plan: Recent liver pane revealed AST 43 stable.  Remainder of liver panel wnl.  With recent intermittent pain, check abdominal ultrasound.  Orders: -     US Abdomen Complete; Future  Osteoporosis without current pathological fracture, unspecified osteoporosis type Assessment & Plan: Being followed by endocrinology. Bone density 12/2022 - osteoporosis.  Continue calcium, vitamin D and weight bearing exercise.  Reclast 06/30/23.    Leukopenia, unspecified type Assessment & Plan: Follow cbc.    Hypothyroidism, unspecified type Assessment & Plan: On thyroid replacement.  Follow tsh.     Primary hypertension Assessment & Plan: Continue lisinopril.  Blood pressure doing well.  Follow pressure.  Follow metabolic panel.    History of colonic polyps Assessment &  Plan: Colonoscopy 08/04/19- Dr Doy Hutching- normal colon. TI and random biopsies negative.    Gastroesophageal reflux disease, unspecified whether esophagitis present Assessment & Plan: Continues on prilosec.    Centrilobular emphysema (HCC) Assessment & Plan: Evaluated by pulmonary as outlined.   Continue trelegy.  Breathing stable.    Aortic atherosclerosis (HCC) Assessment & Plan: Continue crestor.       Dale Anthem, MD

## 2023-07-31 NOTE — Telephone Encounter (Signed)
Lft pt vm to call ofc to sch [place]. thanks ?

## 2023-08-02 ENCOUNTER — Encounter: Payer: Self-pay | Admitting: Internal Medicine

## 2023-08-02 NOTE — Assessment & Plan Note (Signed)
Colonoscopy 08/04/19- Dr Doy Hutching- normal colon. TI and random biopsies negative.

## 2023-08-02 NOTE — Assessment & Plan Note (Signed)
 Continue crestor

## 2023-08-02 NOTE — Assessment & Plan Note (Signed)
Continue lisinopril.  Blood pressure doing well.  Follow pressure.  Follow metabolic panel.

## 2023-08-02 NOTE — Assessment & Plan Note (Signed)
 Follow cbc.

## 2023-08-02 NOTE — Assessment & Plan Note (Signed)
Evaluated by pulmonary as outlined.   Continue trelegy.  Breathing stable.

## 2023-08-02 NOTE — Assessment & Plan Note (Signed)
 Being followed by endocrinology. Bone density 12/2022 - osteoporosis.  Continue calcium, vitamin D and weight bearing exercise.  Reclast 06/30/23.

## 2023-08-02 NOTE — Assessment & Plan Note (Signed)
 On thyroid replacement.  Follow tsh.

## 2023-08-02 NOTE — Assessment & Plan Note (Signed)
Continues on prilosec.  

## 2023-08-02 NOTE — Assessment & Plan Note (Signed)
 Recent liver pane revealed AST 43 stable.  Remainder of liver panel wnl.  With recent intermittent pain, check abdominal ultrasound.

## 2023-08-02 NOTE — Assessment & Plan Note (Signed)
 Describes the intermittent discomfort under right shoulder blade extending to right upper quadrant. Recent AST slightly elevated.  Check abdominal ultrasound. Follow for possible triggers. Continue prilosec.

## 2023-08-02 NOTE — Assessment & Plan Note (Signed)
 Lateral hip pain as outlined. Persistent intermittent issue. Discussed bursitis, arthritis. Request referral to Dr Martha Clan. Has been to chiropractor. Exercising.

## 2023-08-02 NOTE — Assessment & Plan Note (Signed)
 Continue crestor.  Low cholesterol diet and exercise.  Follow lipid panel and liver function tests.

## 2023-08-05 ENCOUNTER — Ambulatory Visit
Admission: RE | Admit: 2023-08-05 | Discharge: 2023-08-05 | Disposition: A | Discharge status: Home / Self Care | Payer: Medicare PPO | Source: Ambulatory Visit | Attending: Internal Medicine | Admitting: Internal Medicine

## 2023-08-05 DIAGNOSIS — R109 Unspecified abdominal pain: Secondary | ICD-10-CM | POA: Diagnosis not present

## 2023-08-05 DIAGNOSIS — R7989 Other specified abnormal findings of blood chemistry: Secondary | ICD-10-CM | POA: Diagnosis not present

## 2023-08-05 DIAGNOSIS — R1011 Right upper quadrant pain: Secondary | ICD-10-CM | POA: Diagnosis not present

## 2023-08-05 DIAGNOSIS — R1013 Epigastric pain: Secondary | ICD-10-CM | POA: Diagnosis not present

## 2023-08-05 DIAGNOSIS — N281 Cyst of kidney, acquired: Secondary | ICD-10-CM | POA: Diagnosis not present

## 2023-08-05 DIAGNOSIS — M549 Dorsalgia, unspecified: Secondary | ICD-10-CM | POA: Diagnosis not present

## 2023-08-11 DIAGNOSIS — M7071 Other bursitis of hip, right hip: Secondary | ICD-10-CM | POA: Diagnosis not present

## 2023-08-12 DIAGNOSIS — R0602 Shortness of breath: Secondary | ICD-10-CM | POA: Diagnosis not present

## 2023-08-12 DIAGNOSIS — E7800 Pure hypercholesterolemia, unspecified: Secondary | ICD-10-CM | POA: Diagnosis not present

## 2023-08-17 DIAGNOSIS — M5417 Radiculopathy, lumbosacral region: Secondary | ICD-10-CM | POA: Diagnosis not present

## 2023-08-17 DIAGNOSIS — M5136 Other intervertebral disc degeneration, lumbar region with discogenic back pain only: Secondary | ICD-10-CM | POA: Diagnosis not present

## 2023-08-17 DIAGNOSIS — M5416 Radiculopathy, lumbar region: Secondary | ICD-10-CM | POA: Diagnosis not present

## 2023-08-17 DIAGNOSIS — M9903 Segmental and somatic dysfunction of lumbar region: Secondary | ICD-10-CM | POA: Diagnosis not present

## 2023-08-20 DIAGNOSIS — M25551 Pain in right hip: Secondary | ICD-10-CM | POA: Diagnosis not present

## 2023-08-20 DIAGNOSIS — M7061 Trochanteric bursitis, right hip: Secondary | ICD-10-CM | POA: Diagnosis not present

## 2023-08-27 ENCOUNTER — Ambulatory Visit: Payer: Medicare PPO | Admitting: Pulmonary Disease

## 2023-08-27 ENCOUNTER — Encounter: Payer: Self-pay | Admitting: Pulmonary Disease

## 2023-08-27 VITALS — BP 120/60 | HR 68 | Temp 97.1°F | Ht 63.0 in | Wt 142.6 lb

## 2023-08-27 DIAGNOSIS — Z87891 Personal history of nicotine dependence: Secondary | ICD-10-CM

## 2023-08-27 DIAGNOSIS — J4489 Other specified chronic obstructive pulmonary disease: Secondary | ICD-10-CM

## 2023-08-27 DIAGNOSIS — K760 Fatty (change of) liver, not elsewhere classified: Secondary | ICD-10-CM

## 2023-08-27 DIAGNOSIS — J439 Emphysema, unspecified: Secondary | ICD-10-CM

## 2023-08-27 DIAGNOSIS — R0602 Shortness of breath: Secondary | ICD-10-CM | POA: Diagnosis not present

## 2023-08-27 DIAGNOSIS — J3089 Other allergic rhinitis: Secondary | ICD-10-CM

## 2023-08-27 MED ORDER — TRELEGY ELLIPTA 100-62.5-25 MCG/ACT IN AEPB: 1.0000 | INHALATION_SPRAY | Freq: Every day | RESPIRATORY_TRACT | 11 refills | Status: DC

## 2023-08-27 NOTE — Progress Notes (Signed)
 Subjective:    Patient ID: Savannah Heinlein, female    DOB: Jan 03, 1946, 78 y.o.   MRN: [id number]  Patient Care Team: [place], MD as PCP - General (Internal Medicine) Salena Saner, MD as Consulting Physician (Pulmonary Disease)  Chief Complaint  Patient presents with   Follow-up    No Wheezing or cough. DOE.    BACKGROUND/INTERVAL: Erskine Squibb is a 78 year old remote former smoker who follows for the issue of COPD with chronic bronchitis. This is a scheduled visit. Last seen here 27 August 2022.  She continues to use Trelegy Ellipta consistently.  No exacerbations or hospitalizations in the interim.  HPI Discussed the use of AI scribe software for clinical note transcription with the patient, who gave verbal consent to proceed.  History of Present Illness   The patient, with COPD, presents for follow-up of COPD management.  No recent hospitalizations or exacerbations of COPD since her last visit a year ago. She has not needed to use her albuterol inhaler and only experiences shortness of breath with more strenuous activities like sweeping, but can walk on a treadmill for four miles without issues.  She has experienced increased mucus production over the past month, which she has difficulty expectorating. She describes the mucus as something she 'just swallows' and notes that it has been getting better recently. She inquires about using Mucinex to help with this symptom. She takes Trelegy for her COPD and is advised to rinse her mouth after use to prevent throat irritation. She also takes loratadine for allergies and is considering switching to Zyrtec if needed.  She experiences heartburn and pain between her shoulder blades, questioning if it could be related to her lungs. She is unsure if the pain is related to her spine or osteoporosis treatment, as she has been treated with Reclast.  She mentions having undergone an ultrasound that indicated a fatty liver.      DATA: 01/30/2020 PFTs: FEV1 1.99 L or 94% predicted, FVC 2.40 L or 85% predicted, no bronchodilator response. FEV1/FVC 83%.  Lung volumes are low end of normal.  Diffusion capacity mildly reduced 06/26/2021 PFTs: FEV1 2.09 L or 104% predicted, FVC 2.39 L or 90% predicted, FEV1/FVC 87%, no bronchodilator response.  Lung volumes at low end normal, small airways component.  Diffusion capacity mildly reduced.  Consistent with very mild COPD  Review of Systems A 10 point review of systems was performed and it is as noted above otherwise negative.   Patient Active Problem List   Diagnosis Date Noted   Fatigue 02/05/2022   Leg cramps 07/14/2021   Kidney cysts 03/20/2021   Chest pain 02/14/2021   Monocytosis 02/14/2021   Abnormal liver function test 02/14/2021   Back pain 10/11/2020   Centrilobular emphysema (HCC) 02/13/2020   Former smoker 02/13/2020   Sleeping difficulties 01/22/2020   Leukopenia 09/12/2019   SOB (shortness of breath) 06/06/2019   Chest tightness 03/20/2019   Osteoporosis 07/12/2018   Bilateral hip pain 06/12/2018   Aortic atherosclerosis (HCC) 10/10/2017   Finger pain, right 07/05/2017   Acute colitis 04/17/2016   Diarrhea 04/17/2016   Hypokalemia 04/17/2016   Headache 04/17/2016   GIB (gastrointestinal bleeding) 04/14/2016   History of colonic polyps 01/31/2016   Change in bowel movement 10/21/2015   Pain of both thumbs 01/14/2015   Right hip pain 01/10/2015   Health care maintenance 09/04/2014   Abnormal mammogram 09/25/2013   Abdominal pain 02/22/2013   Anemia 05/01/2012   GERD (gastroesophageal reflux disease) 05/01/2012  Hematuria 05/01/2012   Hypothyroidism 05/01/2012   Hypercholesterolemia 05/01/2012   Hypertension 05/01/2012    Social History   Tobacco Use   Smoking status: Former    Current packs/day: 0.00    Average packs/day: 1.5 packs/day for 30.0 years (45.0 ttl pk-yrs)    Types: Cigarettes    Start date: 72    Quit date: 78     Years since quitting: 35.2   Smokeless tobacco: Never  Substance Use Topics   Alcohol use: Yes    Alcohol/week: 0.0 standard drinks of alcohol    Comment: Rare drink, one drink per month    Allergies  Allergen Reactions   Augmentin [Amoxicillin-Pot Clavulanate] Rash    Has patient had a PCN reaction causing immediate rash, facial/tongue/throat swelling, SOB or lightheadedness with hypotension: No Has patient had a PCN reaction causing severe rash involving mucus membranes or skin necrosis: No Has patient had a PCN reaction that required hospitalization: No Has patient had a PCN reaction occurring within the last 10 years: No If all of the above answers are "NO", then may proceed with Cephalosporin use.     Current Meds  Medication Sig   levothyroxine (SYNTHROID) 75 MCG tablet TAKE 1 TABLET BY MOUTH EVERY DAY   lisinopril (ZESTRIL) 10 MG tablet TAKE 1 TABLET BY MOUTH EVERY DAY   loratadine (CLARITIN) 10 MG tablet Take 10 mg by mouth daily.    Multiple Vitamins-Minerals (CENTRUM SILVER ULTRA WOMENS PO) Take 1 tablet by mouth daily.    omeprazole (PRILOSEC) 40 MG capsule Take 40 mg by mouth daily.   sertraline (ZOLOFT) 50 MG tablet TAKE 1 TABLET BY MOUTH EVERY DAY   [DISCONTINUED] rosuvastatin (CRESTOR) 20 MG tablet TAKE 1 TABLET BY MOUTH EVERY DAY   [DISCONTINUED] TRELEGY ELLIPTA 100-62.5-25 MCG/ACT AEPB TAKE 1 PUFF BY MOUTH EVERY DAY    Immunization History  Administered Date(s) Administered   DTaP 12/09/2010   Fluad Quad(high Dose 65+) 02/14/2021, 03/01/2022   Influenza Split 04/29/2012   Influenza, High Dose Seasonal PF 02/19/2016, 03/19/2017, 06/03/2018, 02/28/2020, 03/18/2023   Influenza,inj,Quad PF,6+ Mos 02/21/2013, 02/13/2014, 05/14/2015   Influenza-Unspecified 02/16/2019   PFIZER(Purple Top)SARS-COV-2 Vaccination 07/31/2019, 08/23/2019, 04/05/2020   Pneumococcal Conjugate-13 04/28/2013   Pneumococcal Polysaccharide-23 02/13/2014   Respiratory Syncytial Virus  Vaccine,Recomb Aduvanted(Arexvy) 03/31/2022   Tdap 03/01/2022   Zoster Recombinant(Shingrix) 02/01/2019, 05/16/2019   Zoster, Live 10/12/2011        Objective:     BP 120/60 (BP Location: Right Arm, Cuff Size: Normal)   Pulse 68   Temp (!) 97.1 F (36.2 C)   Ht 5\' 3"  (1.6 m)   Wt 142 lb 9.6 oz (64.7 kg)   SpO2 100%   BMI 25.26 kg/m   SpO2: 100 % O2 Device: None (Room air)  GENERAL: Well-developed, well-nourished, no acute distress.  Fully ambulatory.  No conversational dyspnea.   HEAD: Normocephalic, atraumatic.  EYES: Pupils equal, round, reactive to light.  No scleral icterus.  MOUTH: Chipped teeth, caps on some teeth. NECK: Supple. No thyromegaly. Trachea midline. No JVD.  No adenopathy. PULMONARY: Good air entry bilaterally.  No adventitious sounds. CARDIOVASCULAR: S1 and S2. Regular rate and rhythm.  No rubs, murmurs or gallops heard. ABDOMEN: Benign. MUSCULOSKELETAL: No joint deformity, no clubbing, no edema.  NEUROLOGIC: No focal deficit, no gait disturbance, speech is fluent. SKIN: Intact,warm,dry. PSYCH: Mood and behavior normal.     Assessment & Plan:     ICD-10-CM   1. COPD with chronic bronchitis and emphysema (HCC)  J44.89 Pulmonary Function Test [facility] Only   J43.9     2. SOB (shortness of breath)  R06.02 Pulmonary Function Test [facility] Only    3. Perennial allergic rhinitis  J30.89     4. Hepatic steatosis  K76.0     5. Former smoker - remote  Z87.891       Orders Placed This Encounter  Procedures   Pulmonary Function Test [facility] Only    Standing Status:   Future    Expected Date:   02/27/2024    Expiration Date:   08/26/2024    Full PFT: includes the following: basic spirometry, spirometry pre & post bronchodilator, diffusion capacity (DLCO), lung volumes:   Full PFT    This test can only be performed at:   [facility] ordered this encounter  Medications   Fluticasone-Umeclidin-Vilant (TRELEGY ELLIPTA) 100-62.5-25 MCG/ACT AEPB     Sig: Inhale 1 puff into the lungs daily.    Dispense:  60 each    Refill:  11   Discussion:    Chronic Obstructive Pulmonary Disease (COPD) COPD is well-managed with no hospitalizations or exacerbations in the past year. She reports increased mucus production over the past month, with difficulty expectorating. She has not used albuterol and can perform activities such as walking on a treadmill without significant issues. Last pulmonary function tests in 2023 indicated mild disease. Discussed the use of Mucinex (guaifenesin) extra strength to aid in mucus clearance, emphasizing hydration and potential benefits of ginger tea. Advised on proper mouth rinsing after Trelegy use to prevent throat irritation. - Recommend Mucinex (guaifenesin) extra strength twice daily to aid mucus clearance. - Advise increased water intake and ginger tea consumption to assist with mucus clearance. - Instruct to rinse mouth well after using Trelegy to prevent throat irritation. - Renew Trelegy prescription. - Order repeat pulmonary function tests to be scheduled before next visit. - Schedule follow-up appointment in six months.  Allergic Rhinitis Possible allergic rhinitis contributing to increased mucus production, as allergy season is starting. She is currently taking loratadine (Claritin) for allergies. Discussed the option of switching to Zyrtec (cetirizine) if loratadine is ineffective, noting potential drowsiness as a side effect. - Continue loratadine (Claritin) for allergy management. - If loratadine is ineffective, consider switching to Zyrtec (cetirizine), advising to take it at bedtime due to potential drowsiness.  Back Pain Reports back pain between shoulder blades, possibly related to spine issues rather than pulmonary origin. Reclast use may contribute to bone pain. Recommended further evaluation if pain persists. - If pain persists, recommend primary care physician to consider spine x-rays.  Fatty  Liver Ultrasound indicates fatty liver, likely related to dietary habits. Monitoring and potential follow-up with primary care physician for further evaluation advised.      Advised if symptoms do not improve or worsen, to please contact office for sooner follow up or seek emergency care.    I spent 32 minutes of dedicated to the care of this patient on the date of this encounter to include pre-visit review of records, face-to-face time with the patient discussing conditions above, post visit ordering of testing, clinical documentation with the electronic health record, making appropriate referrals as documented, and communicating necessary findings to members of the patients care team.     C. Danice Goltz, MD Advanced Bronchoscopy PCCM [facility]-    *This note was generated using voice recognition software/Dragon and/or AI transcription program.  Despite best efforts to proofread, errors can occur which can change  the meaning. Any transcriptional errors that result from this process are unintentional and may not be fully corrected at the time of dictation.

## 2023-08-27 NOTE — Patient Instructions (Signed)
 VISIT SUMMARY:  You came in today for a follow-up on your COPD management. You have not had any recent hospitalizations or exacerbations and have been managing well with your current medications. We discussed your increased mucus production, back pain, and a recent ultrasound finding of a fatty liver.  YOUR PLAN:  -CHRONIC OBSTRUCTIVE PULMONARY DISEASE (COPD): COPD is a chronic lung condition that makes it hard to breathe. Your COPD is well-managed, but you have had increased mucus production recently. We recommend taking Mucinex (guaifenesin) extra strength twice daily to help clear the mucus, increasing your water intake, and drinking more fluids such as ginger tea. Continue using Trelegy and remember to rinse your mouth after each use to prevent throat irritation. We have renewed your Trelegy prescription and will schedule repeat pulmonary function tests before your next visit.  -ALLERGIC RHINITIS: Allergic rhinitis is an allergic reaction that causes sneezing, congestion, and mucus production. You are currently taking loratadine (Claritin) for your allergies. If it is not effective, you can switch to Zyrtec (cetirizine), but be aware it may cause drowsiness, so it is best taken at bedtime.  -BACK PAIN: You have been experiencing back pain between your shoulder blades, which may be related to your spine rather than your lungs. If the pain continues, we recommend seeing your primary care physician for further evaluation, possibly including spine x-rays.  -FATTY LIVER: A fatty liver is a condition where fat builds up in the liver, often related to diet. We suggest monitoring this condition and following up with your primary care physician for further evaluation.  INSTRUCTIONS:  - Schedule repeat pulmonary function tests before your next visit. - Follow-up appointment in six months.

## 2023-09-02 DIAGNOSIS — M7061 Trochanteric bursitis, right hip: Secondary | ICD-10-CM | POA: Diagnosis not present

## 2023-09-02 DIAGNOSIS — M25551 Pain in right hip: Secondary | ICD-10-CM | POA: Diagnosis not present

## 2023-09-07 ENCOUNTER — Ambulatory Visit: Payer: Medicare PPO | Admitting: Dermatology

## 2023-09-07 DIAGNOSIS — Z1283 Encounter for screening for malignant neoplasm of skin: Secondary | ICD-10-CM

## 2023-09-07 DIAGNOSIS — D229 Melanocytic nevi, unspecified: Secondary | ICD-10-CM

## 2023-09-07 DIAGNOSIS — L578 Other skin changes due to chronic exposure to nonionizing radiation: Secondary | ICD-10-CM | POA: Diagnosis not present

## 2023-09-07 DIAGNOSIS — D225 Melanocytic nevi of trunk: Secondary | ICD-10-CM

## 2023-09-07 DIAGNOSIS — W908XXA Exposure to other nonionizing radiation, initial encounter: Secondary | ICD-10-CM | POA: Diagnosis not present

## 2023-09-07 DIAGNOSIS — D1801 Hemangioma of skin and subcutaneous tissue: Secondary | ICD-10-CM

## 2023-09-07 DIAGNOSIS — L814 Other melanin hyperpigmentation: Secondary | ICD-10-CM | POA: Diagnosis not present

## 2023-09-07 DIAGNOSIS — L821 Other seborrheic keratosis: Secondary | ICD-10-CM

## 2023-09-07 NOTE — Patient Instructions (Signed)

## 2023-09-07 NOTE — Progress Notes (Signed)
   Follow-Up Visit   Subjective  Savannah Cox is a 78 y.o. female who presents for the following: Skin Cancer Screening and Full Body Skin Exam  The patient presents for Total-Body Skin Exam (TBSE) for skin cancer screening and mole check. The patient has spots, moles and lesions to be evaluated, some may be new or changing and the patient may have concern these could be cancer.    The following portions of the chart were reviewed this encounter and updated as appropriate: medications, allergies, medical history  Review of Systems:  No other skin or systemic complaints except as noted in HPI or Assessment and Plan.  Objective  Well appearing patient in no apparent distress; mood and affect are within normal limits.  A full examination was performed including scalp, head, eyes, ears, nose, lips, neck, chest, axillae, abdomen, back, buttocks, bilateral upper extremities, bilateral lower extremities, hands, feet, fingers, toes, fingernails, and toenails. All findings within normal limits unless otherwise noted below.   Relevant physical exam findings are noted in the Assessment and Plan.    Assessment & Plan   SKIN CANCER SCREENING PERFORMED TODAY.  ACTINIC DAMAGE - Chronic condition, secondary to cumulative UV/sun exposure - diffuse scaly erythematous macules with underlying dyspigmentation - Recommend daily broad spectrum sunscreen SPF 30+ to sun-exposed areas, reapply every 2 hours as needed.  - Staying in the shade or wearing long sleeves, sun glasses (UVA+UVB protection) and wide brim hats (4-inch brim around the entire circumference of the hat) are also recommended for sun protection.  - Call for new or changing lesions.  LENTIGINES, SEBORRHEIC KERATOSES, HEMANGIOMAS - Benign normal skin lesions - SK 4 mm waxy brown papule on left lateral thigh  - Benign-appearing - Call for any changes   MELANOCYTIC NEVI - Tan-brown and/or pink-flesh-colored symmetric macules and papules -  Flesh papule at the left gluteal cleft  - Benign appearing on exam today - Observation - Call clinic for new or changing moles - Recommend daily use of broad spectrum spf 30+ sunscreen to sun-exposed areas.       Return in about 1 year (around 09/06/2024) for TBSE.  I, Asher Muir, CMA, am acting as scribe for Willeen Niece, MD.   Documentation: I have reviewed the above documentation for accuracy and completeness, and I agree with the above.  Willeen Niece, MD

## 2023-09-09 DIAGNOSIS — M25551 Pain in right hip: Secondary | ICD-10-CM | POA: Diagnosis not present

## 2023-09-09 DIAGNOSIS — M7061 Trochanteric bursitis, right hip: Secondary | ICD-10-CM | POA: Diagnosis not present

## 2023-09-10 ENCOUNTER — Other Ambulatory Visit: Payer: Self-pay | Admitting: Internal Medicine

## 2023-09-21 DIAGNOSIS — M5136 Other intervertebral disc degeneration, lumbar region with discogenic back pain only: Secondary | ICD-10-CM | POA: Diagnosis not present

## 2023-09-21 DIAGNOSIS — M9903 Segmental and somatic dysfunction of lumbar region: Secondary | ICD-10-CM | POA: Diagnosis not present

## 2023-09-21 DIAGNOSIS — M5416 Radiculopathy, lumbar region: Secondary | ICD-10-CM | POA: Diagnosis not present

## 2023-09-21 DIAGNOSIS — M5417 Radiculopathy, lumbosacral region: Secondary | ICD-10-CM | POA: Diagnosis not present

## 2023-09-23 DIAGNOSIS — M25551 Pain in right hip: Secondary | ICD-10-CM | POA: Diagnosis not present

## 2023-09-23 DIAGNOSIS — M7061 Trochanteric bursitis, right hip: Secondary | ICD-10-CM | POA: Diagnosis not present

## 2023-10-19 DIAGNOSIS — M810 Age-related osteoporosis without current pathological fracture: Secondary | ICD-10-CM | POA: Diagnosis not present

## 2023-10-19 DIAGNOSIS — E039 Hypothyroidism, unspecified: Secondary | ICD-10-CM | POA: Diagnosis not present

## 2023-10-28 ENCOUNTER — Other Ambulatory Visit: Payer: Self-pay | Admitting: Medical Genetics

## 2023-10-29 ENCOUNTER — Other Ambulatory Visit
Admission: RE | Admit: 2023-10-29 | Discharge: 2023-10-29 | Disposition: A | Discharge status: Home / Self Care | Payer: Self-pay | Source: Ambulatory Visit | Attending: Medical Genetics | Admitting: Medical Genetics

## 2023-11-04 ENCOUNTER — Ambulatory Visit: Payer: Medicare PPO

## 2023-11-10 LAB — GENECONNECT MOLECULAR SCREEN: Genetic Analysis Overall Interpretation: NEGATIVE

## 2023-11-16 DIAGNOSIS — M5416 Radiculopathy, lumbar region: Secondary | ICD-10-CM | POA: Diagnosis not present

## 2023-11-16 DIAGNOSIS — M5417 Radiculopathy, lumbosacral region: Secondary | ICD-10-CM | POA: Diagnosis not present

## 2023-11-16 DIAGNOSIS — M5136 Other intervertebral disc degeneration, lumbar region with discogenic back pain only: Secondary | ICD-10-CM | POA: Diagnosis not present

## 2023-11-16 DIAGNOSIS — M9903 Segmental and somatic dysfunction of lumbar region: Secondary | ICD-10-CM | POA: Diagnosis not present

## 2023-11-26 ENCOUNTER — Other Ambulatory Visit (INDEPENDENT_AMBULATORY_CARE_PROVIDER_SITE_OTHER): Payer: Medicare PPO

## 2023-11-26 DIAGNOSIS — D649 Anemia, unspecified: Secondary | ICD-10-CM | POA: Diagnosis not present

## 2023-11-26 DIAGNOSIS — E7800 Pure hypercholesterolemia, unspecified: Secondary | ICD-10-CM | POA: Diagnosis not present

## 2023-11-26 LAB — CBC WITH DIFFERENTIAL/PLATELET
Basophils Absolute: 0.1 10*3/uL (ref 0.0–0.1)
Basophils Relative: 1.9 % (ref 0.0–3.0)
Eosinophils Absolute: 0.2 10*3/uL (ref 0.0–0.7)
Eosinophils Relative: 5.2 % — ABNORMAL HIGH (ref 0.0–5.0)
HCT: 39.8 % (ref 36.0–46.0)
Hemoglobin: 13.2 g/dL (ref 12.0–15.0)
Lymphocytes Relative: 28.9 % (ref 12.0–46.0)
Lymphs Abs: 1.3 10*3/uL (ref 0.7–4.0)
MCHC: 33.2 g/dL (ref 30.0–36.0)
MCV: 97.2 fl (ref 78.0–100.0)
Monocytes Absolute: 0.6 10*3/uL (ref 0.1–1.0)
Monocytes Relative: 13.4 % — ABNORMAL HIGH (ref 3.0–12.0)
Neutro Abs: 2.3 10*3/uL (ref 1.4–7.7)
Neutrophils Relative %: 50.6 % (ref 43.0–77.0)
Platelets: 195 10*3/uL (ref 150.0–400.0)
RBC: 4.1 Mil/uL (ref 3.87–5.11)
RDW: 13.9 % (ref 11.5–15.5)
WBC: 4.6 10*3/uL (ref 4.0–10.5)

## 2023-11-26 LAB — BASIC METABOLIC PANEL WITH GFR
BUN: 16 mg/dL (ref 6–23)
CO2: 26 meq/L (ref 19–32)
Calcium: 9 mg/dL (ref 8.4–10.5)
Chloride: 107 meq/L (ref 96–112)
Creatinine, Ser: 0.75 mg/dL (ref 0.40–1.20)
GFR: 77.57 mL/min (ref >60.00)
Glucose, Bld: 85 mg/dL (ref 70–99)
Potassium: 4 meq/L (ref 3.5–5.1)
Sodium: 142 meq/L (ref 135–145)

## 2023-11-26 LAB — LIPID PANEL
Cholesterol: 188 mg/dL (ref 0–200)
HDL: 73.2 mg/dL (ref >39.00)
LDL Cholesterol: 102 mg/dL — ABNORMAL HIGH (ref 0–99)
NonHDL: 114.68
Total CHOL/HDL Ratio: 3
Triglycerides: 61 mg/dL (ref 0.0–149.0)
VLDL: 12.2 mg/dL (ref 0.0–40.0)

## 2023-11-26 LAB — HEPATIC FUNCTION PANEL
ALT: 28 U/L (ref 0–35)
AST: 37 U/L (ref 0–37)
Albumin: 4 g/dL (ref 3.5–5.2)
Alkaline Phosphatase: 45 U/L (ref 39–117)
Bilirubin, Direct: 0.1 mg/dL (ref 0.0–0.3)
Total Bilirubin: 0.4 mg/dL (ref 0.2–1.2)
Total Protein: 6.2 g/dL (ref 6.0–8.3)

## 2023-11-27 ENCOUNTER — Ambulatory Visit: Payer: Self-pay | Admitting: Internal Medicine

## 2023-11-30 ENCOUNTER — Ambulatory Visit (INDEPENDENT_AMBULATORY_CARE_PROVIDER_SITE_OTHER): Payer: Medicare PPO | Admitting: Internal Medicine

## 2023-11-30 VITALS — BP 104/68 | HR 68 | Temp 98.2°F | Resp 16 | Ht 63.0 in | Wt 140.0 lb

## 2023-11-30 DIAGNOSIS — M810 Age-related osteoporosis without current pathological fracture: Secondary | ICD-10-CM

## 2023-11-30 DIAGNOSIS — I700 Atherosclerosis of aorta: Secondary | ICD-10-CM | POA: Diagnosis not present

## 2023-11-30 DIAGNOSIS — Z0000 Encounter for general adult medical examination without abnormal findings: Secondary | ICD-10-CM | POA: Diagnosis not present

## 2023-11-30 DIAGNOSIS — E039 Hypothyroidism, unspecified: Secondary | ICD-10-CM

## 2023-11-30 DIAGNOSIS — R7989 Other specified abnormal findings of blood chemistry: Secondary | ICD-10-CM

## 2023-11-30 DIAGNOSIS — E7800 Pure hypercholesterolemia, unspecified: Secondary | ICD-10-CM | POA: Diagnosis not present

## 2023-11-30 DIAGNOSIS — J432 Centrilobular emphysema: Secondary | ICD-10-CM

## 2023-11-30 DIAGNOSIS — I10 Essential (primary) hypertension: Secondary | ICD-10-CM

## 2023-11-30 DIAGNOSIS — K219 Gastro-esophageal reflux disease without esophagitis: Secondary | ICD-10-CM

## 2023-11-30 DIAGNOSIS — Z1231 Encounter for screening mammogram for malignant neoplasm of breast: Secondary | ICD-10-CM | POA: Diagnosis not present

## 2023-11-30 MED ORDER — LEVOTHYROXINE SODIUM 75 MCG PO TABS: 75.0000 ug | ORAL_TABLET | Freq: Every day | ORAL | 2 refills | Status: DC

## 2023-11-30 MED ORDER — OMEPRAZOLE MAGNESIUM 20 MG PO TBEC: 20.0000 mg | DELAYED_RELEASE_TABLET | Freq: Every day | ORAL | 1 refills | Status: DC

## 2023-11-30 MED ORDER — LISINOPRIL 10 MG PO TABS: 10.0000 mg | ORAL_TABLET | Freq: Every day | ORAL | 1 refills | Status: DC

## 2023-11-30 NOTE — Assessment & Plan Note (Signed)
 Physical today 11/30/23. PAP 04/2017 - negative with negative HPV.  Mammogram 11/05/22 - Birads I. Scheduled for mammogram 12/08/23.  Colonoscopy 2021. Seeing endocrinology for osteoporosis as outlined.

## 2023-11-30 NOTE — Progress Notes (Unsigned)
 Subjective:    Patient ID: Savannah Cox, female    DOB: 1946-06-02, 78 y.o.   MRN: [id number]  Patient here for  Chief Complaint  Patient presents with   Annual Exam    HPI Here for her physical exam. Had f/u with endocrinology 10/19/23 - next reclast  06/2024 and f/u bone density 12/2024. F/u with pulmonary 08/27/23 - f/u COPD with chronic bronchitis. Some increased congestion reported - recommended mucinex. Continue trelegy. Reported back pain. Discussed with her. She denies any back pain now. Discussed posture and core exercises. No chest pain reported. Breathing stable. Denies sob. Is having some acid reflux. Does not taking her 40mg  daily. Request to have 20mg  and states she will take daily. She was concerned regarding possible side effects of the medication and this is why she did not want to take  the 40mg  daily. Bowels stable with benefiber. Discussed labs.  Will add one crestor  weekly to her every other day regimen.    Past Medical History:  Diagnosis Date   Allergic state    Anemia    Complication of anesthesia    Diverticulosis    GERD (gastroesophageal reflux disease)    History of hiatal hernia    History of kidney stones    Hypercholesterolemia    Hypertension    Hypothyroidism    Osteoporosis, postmenopausal    PONV (postoperative nausea and vomiting)    nauseated with colonoscopy   Shingles    Past Surgical History:  Procedure Laterality Date   BREAST BIOPSY Left 90s   benign   COLONOSCOPY WITH PROPOFOL  N/A 01/28/2016   Procedure: COLONOSCOPY WITH PROPOFOL ;  Surgeon: Deveron Fly, MD;  Location: [facility] ENDOSCOPY;  Service: Endoscopy;  Laterality: N/A;   EXCISION METACARPAL MASS Right 04/30/2017   Procedure: EXCISION MUCOID CYST FIFTH FINGER RIGHT HAND;  Surgeon: Molli Angelucci, MD;  Location: [facility] ORS;  Service: Orthopedics;  Laterality: Right;   EYE SURGERY Bilateral 2010   cataracts   Family History  Problem Relation Age of Onset   Heart disease Father         died age 33 - myocardial infarction   Hypercholesterolemia Mother    Arthritis Mother    Heart attack Mother    Kidney cancer Other        aunt   Breast cancer Maternal Aunt    Colon cancer Neg Hx    Social History   Socioeconomic History   Marital status: Married    Spouse name: Not on file   Number of children: 3   Years of education: 16   Highest education level: Bachelor's degree (e.g., BA, AB, BS)  Occupational History   Occupation: retired Magazine features editor: RETIRED  Tobacco Use   Smoking status: Former    Current packs/day: 0.00    Average packs/day: 1.5 packs/day for 30.0 years (45.0 ttl pk-yrs)    Types: Cigarettes    Start date: 44    Quit date: 1990    Years since quitting: 35.4   Smokeless tobacco: Never  Vaping Use   Vaping status: Never Used  Substance and Sexual Activity   Alcohol use: Yes    Alcohol/week: 0.0 standard drinks of alcohol    Comment: Rare drink, one drink per month   Drug use: No   Sexual activity: Yes  Other Topics Concern   Not on file  Social History Narrative   Not on file   Social Drivers of Health   Financial Resource Strain:  Low Risk  (07/24/2023)   Overall Financial Resource Strain (CARDIA)    Difficulty of Paying Living Expenses: Not hard at all  Food Insecurity: No Food Insecurity (07/24/2023)   Hunger Vital Sign    Worried About Running Out of Food in the Last Year: Never true    Ran Out of Food in the Last Year: Never true  Transportation Needs: No Transportation Needs (07/24/2023)   PRAPARE - Administrator, Civil Service (Medical): No    Lack of Transportation (Non-Medical): No  Physical Activity: Insufficiently Active (07/24/2023)   Exercise Vital Sign    Days of Exercise per Week: 4 days    Minutes of Exercise per Session: 30 min  Stress: No Stress Concern Present (07/24/2023)   [employer] of Occupational Health - Occupational Stress Questionnaire    Feeling of Stress : Only a little  Social  Connections: Socially Integrated (07/24/2023)   Social Connection and Isolation Panel    Frequency of Communication with Friends and Family: Twice a week    Frequency of Social Gatherings with Friends and Family: Twice a week    Attends Religious Services: More than 4 times per year    Active Member of [employer] or Organizations: Yes    Attends Engineer, structural: More than 4 times per year    Marital Status: Married     Review of Systems  Constitutional:  Negative for appetite change and unexpected weight change.  HENT:  Negative for congestion, sinus pressure and sore throat.   Eyes:  Negative for pain and visual disturbance.  Respiratory:  Negative for cough, chest tightness and shortness of breath.   Cardiovascular:  Negative for chest pain, palpitations and leg swelling.  Gastrointestinal:  Negative for abdominal pain, diarrhea, nausea and vomiting.       Some acid reflux as outlined   Genitourinary:  Negative for difficulty urinating and dysuria.  Musculoskeletal:  Negative for joint swelling and myalgias.  Skin:  Negative for color change and rash.  Neurological:  Negative for dizziness and headaches.  Hematological:  Negative for adenopathy. Does not bruise/bleed easily.  Psychiatric/Behavioral:  Negative for agitation and dysphoric mood.        Objective:     BP 104/68   Pulse 68   Temp 98.2 F (36.8 C)   Resp 16   Ht 5' 3 (1.6 m)   Wt 140 lb (63.5 kg)   SpO2 99%   BMI 24.80 kg/m  Wt Readings from Last 3 Encounters:  11/30/23 140 lb (63.5 kg)  08/27/23 142 lb 9.6 oz (64.7 kg)  07/31/23 142 lb 9.6 oz (64.7 kg)    Physical Exam Vitals reviewed.  Constitutional:      General: She is not in acute distress.    Appearance: Normal appearance. She is well-developed.  HENT:     Head: Normocephalic and atraumatic.     Right Ear: External ear normal.     Left Ear: External ear normal.     Mouth/Throat:     Pharynx: No oropharyngeal exudate or posterior  oropharyngeal erythema.   Eyes:     General: No scleral icterus.       Right eye: No discharge.        Left eye: No discharge.     Conjunctiva/sclera: Conjunctivae normal.   Neck:     Thyroid : No thyromegaly.   Cardiovascular:     Rate and Rhythm: Normal rate and regular rhythm.  Pulmonary:  Effort: No tachypnea, accessory muscle usage or respiratory distress.     Breath sounds: Normal breath sounds. No decreased breath sounds or wheezing.  Chest:  Breasts:    Right: No inverted nipple, mass, nipple discharge or tenderness (no axillary adenopathy).     Left: No inverted nipple, mass, nipple discharge or tenderness (no axilarry adenopathy).  Abdominal:     General: Bowel sounds are normal.     Palpations: Abdomen is soft.     Tenderness: There is no abdominal tenderness.   Musculoskeletal:        General: No swelling or tenderness.     Cervical back: Neck supple.  Lymphadenopathy:     Cervical: No cervical adenopathy.   Skin:    Findings: No erythema or rash.   Neurological:     Mental Status: She is alert and oriented to person, place, and time.   Psychiatric:        Mood and Affect: Mood normal.        Behavior: Behavior normal.     {Perform Simple Foot Exam  Perform Detailed exam:1} {Insert foot Exam (Optional):30965}   Outpatient Encounter Medications as of 11/30/2023  Medication Sig   sertraline  (ZOLOFT ) 50 MG tablet TAKE 1 TABLET BY MOUTH EVERY DAY (Patient taking differently: Take 25 mg by mouth daily.)   Fluticasone-Umeclidin-Vilant (TRELEGY ELLIPTA ) 100-62.5-25 MCG/ACT AEPB Inhale 1 puff into the lungs daily.   levothyroxine  (SYNTHROID ) 75 MCG tablet TAKE 1 TABLET BY MOUTH EVERY DAY   lisinopril  (ZESTRIL ) 10 MG tablet TAKE 1 TABLET BY MOUTH EVERY DAY   loratadine  (CLARITIN ) 10 MG tablet Take 10 mg by mouth daily.    Multiple Vitamins-Minerals (CENTRUM SILVER ULTRA WOMENS PO) Take 1 tablet by mouth daily.    rosuvastatin  (CRESTOR ) 20 MG tablet TAKE 1  TABLET BY MOUTH EVERY DAY   [DISCONTINUED] omeprazole  (PRILOSEC) 40 MG capsule Take 40 mg by mouth daily.   No facility-administered encounter medications on file as of 11/30/2023.     Lab Results  Component Value Date   WBC 4.6 11/26/2023   HGB 13.2 11/26/2023   HCT 39.8 11/26/2023   PLT 195.0 11/26/2023   GLUCOSE 85 11/26/2023   CHOL 188 11/26/2023   TRIG 61.0 11/26/2023   HDL 73.20 11/26/2023   LDLDIRECT 165.9 05/11/2013   LDLCALC 102 (H) 11/26/2023   ALT 28 11/26/2023   AST 37 11/26/2023   NA 142 11/26/2023   K 4.0 11/26/2023   CL 107 11/26/2023   CREATININE 0.75 11/26/2023   BUN 16 11/26/2023   CO2 26 11/26/2023   TSH 1.25 03/12/2023   INR 0.96 04/14/2016    No results found.     Assessment & Plan:  Encounter for screening mammogram for malignant neoplasm of breast -     3D Screening Mammogram, Left and Right; Future  Health care maintenance Assessment & Plan: Physical today 11/30/23. PAP 04/2017 - negative with negative HPV.  Mammogram 11/05/22 - Birads I.  Colonoscopy 2021. Seeing endocrinology for osteoporosis as outlined.    Hypercholesterolemia  Hypothyroidism, unspecified type     Dellar Fenton, MD

## 2023-12-04 ENCOUNTER — Encounter: Payer: Self-pay | Admitting: Internal Medicine

## 2023-12-04 NOTE — Assessment & Plan Note (Signed)
 Had f/u with endocrinology 10/19/23 - next reclast  06/2024 and f/u bone density 12/2024.

## 2023-12-04 NOTE — Assessment & Plan Note (Signed)
 She is not taking the 40mg  prilosec daily. Concerned about possible side effects of medication. Is having some acid reflux. Agreeable to take 20mg  prilosec daily now. Follow.  Call with update.

## 2023-12-04 NOTE — Assessment & Plan Note (Signed)
 Continue crestor .  Low cholesterol diet and exercise.  Follow lipid panel and liver function tests.  Will add one more crestor  weekly to her current medication regimen.

## 2023-12-04 NOTE — Assessment & Plan Note (Signed)
 On thyroid replacement.  Follow tsh.

## 2023-12-04 NOTE — Assessment & Plan Note (Signed)
 Continue lisinopril .  Blood pressure doing well.  Follow pressure.  No changes today. Follow metabolic panel.

## 2023-12-04 NOTE — Assessment & Plan Note (Signed)
 Continue crestor

## 2023-12-04 NOTE — Assessment & Plan Note (Signed)
 Recent liver panel 11/2023 - wnl.

## 2023-12-04 NOTE — Assessment & Plan Note (Signed)
 Being followed by pulmonary. Continue trelegy. Breathing stable.

## 2023-12-08 ENCOUNTER — Ambulatory Visit
Admission: RE | Admit: 2023-12-08 | Discharge: 2023-12-08 | Disposition: A | Discharge status: Home / Self Care | Source: Ambulatory Visit | Attending: Internal Medicine | Admitting: Internal Medicine

## 2023-12-08 DIAGNOSIS — Z1231 Encounter for screening mammogram for malignant neoplasm of breast: Secondary | ICD-10-CM | POA: Insufficient documentation

## 2023-12-21 DIAGNOSIS — M9903 Segmental and somatic dysfunction of lumbar region: Secondary | ICD-10-CM | POA: Diagnosis not present

## 2023-12-21 DIAGNOSIS — M5416 Radiculopathy, lumbar region: Secondary | ICD-10-CM | POA: Diagnosis not present

## 2023-12-21 DIAGNOSIS — M5136 Other intervertebral disc degeneration, lumbar region with discogenic back pain only: Secondary | ICD-10-CM | POA: Diagnosis not present

## 2023-12-21 DIAGNOSIS — M5417 Radiculopathy, lumbosacral region: Secondary | ICD-10-CM | POA: Diagnosis not present

## 2024-01-18 DIAGNOSIS — M5417 Radiculopathy, lumbosacral region: Secondary | ICD-10-CM | POA: Diagnosis not present

## 2024-01-18 DIAGNOSIS — M5136 Other intervertebral disc degeneration, lumbar region with discogenic back pain only: Secondary | ICD-10-CM | POA: Diagnosis not present

## 2024-01-18 DIAGNOSIS — M5416 Radiculopathy, lumbar region: Secondary | ICD-10-CM | POA: Diagnosis not present

## 2024-01-18 DIAGNOSIS — M9903 Segmental and somatic dysfunction of lumbar region: Secondary | ICD-10-CM | POA: Diagnosis not present

## 2024-02-22 DIAGNOSIS — M9903 Segmental and somatic dysfunction of lumbar region: Secondary | ICD-10-CM | POA: Diagnosis not present

## 2024-02-22 DIAGNOSIS — M5416 Radiculopathy, lumbar region: Secondary | ICD-10-CM | POA: Diagnosis not present

## 2024-02-22 DIAGNOSIS — M5417 Radiculopathy, lumbosacral region: Secondary | ICD-10-CM | POA: Diagnosis not present

## 2024-02-22 DIAGNOSIS — M5136 Other intervertebral disc degeneration, lumbar region with discogenic back pain only: Secondary | ICD-10-CM | POA: Diagnosis not present

## 2024-02-24 ENCOUNTER — Other Ambulatory Visit: Payer: Self-pay

## 2024-02-24 DIAGNOSIS — R0602 Shortness of breath: Secondary | ICD-10-CM

## 2024-02-24 NOTE — Progress Notes (Unsigned)
 Pft

## 2024-02-25 ENCOUNTER — Ambulatory Visit (INDEPENDENT_AMBULATORY_CARE_PROVIDER_SITE_OTHER): Admitting: Pulmonary Disease

## 2024-02-25 ENCOUNTER — Encounter

## 2024-02-25 DIAGNOSIS — J4489 Other specified chronic obstructive pulmonary disease: Secondary | ICD-10-CM | POA: Diagnosis not present

## 2024-02-25 DIAGNOSIS — J439 Emphysema, unspecified: Secondary | ICD-10-CM

## 2024-02-25 DIAGNOSIS — R0602 Shortness of breath: Secondary | ICD-10-CM

## 2024-02-25 LAB — PULMONARY FUNCTION TEST
DL/VA % pred: 86 %
DL/VA: 3.59 ml/min/mmHg/L
DLCO unc % pred: 76 %
DLCO unc: 13.92 ml/min/mmHg
FEF 25-75 Post: 2.59 L/s
FEF 25-75 Pre: 3.23 L/s
FEF2575-%Change-Post: -19 %
FEF2575-%Pred-Post: 166 %
FEF2575-%Pred-Pre: 207 %
FEV1-%Change-Post: 1 %
FEV1-%Pred-Post: 110 %
FEV1-%Pred-Pre: 108 %
FEV1-Post: 2.18 L
FEV1-Pre: 2.15 L
FEV1FVC-%Change-Post: 0 %
FEV1FVC-%Pred-Pre: 115 %
FEV6-%Change-Post: 0 %
FEV6-%Pred-Post: 99 %
FEV6-%Pred-Pre: 98 %
FEV6-Post: 2.5 L
FEV6-Pre: 2.48 L
FEV6FVC-%Pred-Post: 105 %
FEV6FVC-%Pred-Pre: 105 %
FVC-%Change-Post: 0 %
FVC-%Pred-Post: 94 %
FVC-%Pred-Pre: 93 %
FVC-Post: 2.5 L
FVC-Pre: 2.48 L
Post FEV1/FVC ratio: 87 %
Post FEV6/FVC ratio: 100 %
Pre FEV1/FVC ratio: 87 %
Pre FEV6/FVC Ratio: 100 %
RV % pred: 82 %
RV: 1.86 L
TLC % pred: 88 %
TLC: 4.35 L

## 2024-02-25 NOTE — Patient Instructions (Signed)
 Full PFT completed today ? ?

## 2024-02-25 NOTE — Progress Notes (Signed)
 Full PFT completed today ? ?

## 2024-02-29 ENCOUNTER — Encounter: Payer: Self-pay | Admitting: Pulmonary Disease

## 2024-02-29 ENCOUNTER — Ambulatory Visit: Admitting: Pulmonary Disease

## 2024-02-29 VITALS — BP 124/66 | HR 66 | Temp 97.7°F | Ht 63.0 in | Wt 140.0 lb

## 2024-02-29 DIAGNOSIS — Z87891 Personal history of nicotine dependence: Secondary | ICD-10-CM

## 2024-02-29 DIAGNOSIS — J4489 Other specified chronic obstructive pulmonary disease: Secondary | ICD-10-CM

## 2024-02-29 DIAGNOSIS — R0981 Nasal congestion: Secondary | ICD-10-CM

## 2024-02-29 DIAGNOSIS — J3089 Other allergic rhinitis: Secondary | ICD-10-CM

## 2024-02-29 DIAGNOSIS — J449 Chronic obstructive pulmonary disease, unspecified: Secondary | ICD-10-CM | POA: Diagnosis not present

## 2024-02-29 MED ORDER — BREO ELLIPTA 100-25 MCG/ACT IN AEPB: 1.0000 | INHALATION_SPRAY | Freq: Every day | RESPIRATORY_TRACT | 5 refills | Status: AC

## 2024-02-29 NOTE — Patient Instructions (Signed)
 VISIT SUMMARY:  Today, we discussed your mild COPD and nasal congestion. We reviewed your recent pulmonary function test results and talked about your current symptoms and medications.  YOUR PLAN:  - VERY MILD CHRONIC OBSTRUCTIVE PULMONARY DISEASE (COPD): COPD/asthma is a chronic lung condition that makes it hard to breathe. Your recent test showed excellent lung function. We will switch your medication from Trelegy to Breo to reduce dryness and raspiness. Please ensure Ranell is covered by your insurance at the same rate as Trelegy and remember to rinse your mouth well after using it. We will follow up in six months.  -NASAL CONGESTION: Your nasal congestion is likely due to dryness, which may cause you to breathe through your mouth during exercise. We recommend trying a saline nasal spray (examples: AYR, simply saline) to help with dryness and using Breathe Right strips during exercise to improve nasal airflow. Also, make sure to stay well hydrated.  INSTRUCTIONS:  Please check with your insurance to confirm that Ranell is covered at the same rate as Trelegy. Schedule a follow-up appointment in six months.

## 2024-02-29 NOTE — Progress Notes (Signed)
 Subjective:    Patient ID: Savannah Cox, female    DOB: Jul 28, 1945, 78 y.o.   MRN: [id number]  Patient Care Team: Glendia Shad, MD as PCP - General (Internal Medicine) Tamea Dedra CROME, MD as Consulting Physician (Pulmonary Disease)  Chief Complaint  Patient presents with   COPD    Shortness of breath on exertion.     BACKGROUND/INTERVAL:Jane is a 78 year old remote former smoker who follows for the issue of COPD with chronic bronchitis. This is a scheduled visit. Last seen here 27 August 2023.  She continues to use Trelegy Ellipta  consistently.  No exacerbations or hospitalizations in the interim.   HPI Discussed the use of AI scribe software for clinical note transcription with the patient, who gave verbal consent to proceed.  History of Present Illness   Savannah Cox is a 78 year old female with mild COPD who presents for follow-up.  Her recent pulmonary function test was performed last week.  She breathes through her mouth more often, especially when walking or going up an incline, which she attributes to a possible habit or exercise routine. No usual nasal congestion as she takes Claritin  daily, which helps manage her symptoms.  She is currently using Trelegy for her COPD management and is doing okay with it. She inquires about the possibility of using a less intensive medication.     DATA: 01/30/2020 PFTs: FEV1 1.99 L or 94% predicted, FVC 2.40 L or 85% predicted, no bronchodilator response. FEV1/FVC 83%.  Lung volumes are low end of normal.  Diffusion capacity mildly reduced 06/26/2021 PFTs: FEV1 2.09 L or 104% predicted, FVC 2.39 L or 90% predicted, FEV1/FVC 87%, no bronchodilator response.  Lung volumes at low end normal, small airways component.  Diffusion capacity mildly reduced.  Consistent with very mild COPD. 02/25/2024 PFTs: FEV1 2.15 L or 108% predicted, FVC 2.48 L or 93% predicted, FEV1/FVC 87%, no bronchodilator response.  Mild increase in airway  resistance.  Lung volumes normal.  Diffusion capacity minimally reduced corrects to normal by alveolar volume.  Minimal obstruction suggested by increased airway resistance.  Review of Systems A 10 point review of systems was performed and it is as noted above otherwise negative.   Patient Active Problem List   Diagnosis Date Noted   Fatigue 02/05/2022   Leg cramps 07/14/2021   Kidney cysts 03/20/2021   Chest pain 02/14/2021   Monocytosis 02/14/2021   Abnormal liver function test 02/14/2021   Back pain 10/11/2020   Centrilobular emphysema (HCC) 02/13/2020   Former smoker 02/13/2020   Sleeping difficulties 01/22/2020   Leukopenia 09/12/2019   SOB (shortness of breath) 06/06/2019   Chest tightness 03/20/2019   Osteoporosis 07/12/2018   Bilateral hip pain 06/12/2018   Aortic atherosclerosis 10/10/2017   Finger pain, right 07/05/2017   Acute colitis 04/17/2016   Diarrhea 04/17/2016   Hypokalemia 04/17/2016   Headache 04/17/2016   GIB (gastrointestinal bleeding) 04/14/2016   History of colonic polyps 01/31/2016   Change in bowel movement 10/21/2015   Pain of both thumbs 01/14/2015   Right hip pain 01/10/2015   Health care maintenance 09/04/2014   Abnormal mammogram 09/25/2013   Abdominal pain 02/22/2013   Anemia 05/01/2012   GERD (gastroesophageal reflux disease) 05/01/2012   Hematuria 05/01/2012   Hypothyroidism 05/01/2012   Hypercholesterolemia 05/01/2012   Hypertension 05/01/2012    Social History   Tobacco Use   Smoking status: Former    Current packs/day: 0.00    Average packs/day: 1.5 packs/day for  30.0 years (45.0 ttl pk-yrs)    Types: Cigarettes    Start date: 30    Quit date: 69    Years since quitting: 35.7   Smokeless tobacco: Never  Substance Use Topics   Alcohol use: Yes    Alcohol/week: 0.0 standard drinks of alcohol    Comment: Rare drink, one drink per month    Allergies  Allergen Reactions   Augmentin [Amoxicillin-Pot Clavulanate] Rash     Has patient had a PCN reaction causing immediate rash, facial/tongue/throat swelling, SOB or lightheadedness with hypotension: No Has patient had a PCN reaction causing severe rash involving mucus membranes or skin necrosis: No Has patient had a PCN reaction that required hospitalization: No Has patient had a PCN reaction occurring within the last 10 years: No If all of the above answers are NO, then may proceed with Cephalosporin use.     Current Meds  Medication Sig   BREO ELLIPTA  100-25 MCG/ACT AEPB Inhale 1 puff into the lungs daily.   levothyroxine  (SYNTHROID ) 75 MCG tablet Take 1 tablet (75 mcg total) by mouth daily.   lisinopril  (ZESTRIL ) 10 MG tablet Take 1 tablet (10 mg total) by mouth daily.   loratadine  (CLARITIN ) 10 MG tablet Take 10 mg by mouth daily.    omeprazole  (PRILOSEC  OTC) 20 MG tablet Take 1 tablet (20 mg total) by mouth daily.   rosuvastatin  (CRESTOR ) 20 MG tablet TAKE 1 TABLET BY MOUTH EVERY DAY   sertraline  (ZOLOFT ) 50 MG tablet TAKE 1 TABLET BY MOUTH EVERY DAY   [DISCONTINUED] Fluticasone-Umeclidin-Vilant (TRELEGY ELLIPTA ) 100-62.5-25 MCG/ACT AEPB Inhale 1 puff into the lungs daily.    Immunization History  Administered Date(s) Administered   DTaP 12/09/2010   Fluad Quad(high Dose 65+) 02/14/2021, 03/01/2022   INFLUENZA, HIGH DOSE SEASONAL PF 02/19/2016, 03/19/2017, 06/03/2018, 02/28/2020, 03/18/2023   Influenza Split 04/29/2012   Influenza,inj,Quad PF,6+ Mos 02/21/2013, 02/13/2014, 05/14/2015   Influenza-Unspecified 02/16/2019   PFIZER(Purple Top)SARS-COV-2 Vaccination 07/31/2019, 08/23/2019, 04/05/2020   Pneumococcal Conjugate-13 04/28/2013   Pneumococcal Polysaccharide-23 02/13/2014   Respiratory Syncytial Virus Vaccine,Recomb Aduvanted(Arexvy) 03/31/2022   Tdap 12/09/2010, 03/01/2022   Zoster Recombinant(Shingrix) 02/01/2019, 05/16/2019   Zoster, Live 10/12/2011        Objective:     BP 124/66   Pulse 66   Temp 97.7 F (36.5 C) (Temporal)    Ht 5' 3 (1.6 m)   Wt 140 lb (63.5 kg)   SpO2 96%   BMI 24.80 kg/m   SpO2: 96 %  GENERAL: Well-developed, well-nourished, no acute distress.  Fully ambulatory.  No conversational dyspnea.   HEAD: Normocephalic, atraumatic.  EYES: Pupils equal, round, reactive to light.  No scleral icterus.  MOUTH: Chipped teeth, caps on some teeth. NECK: Supple. No thyromegaly. Trachea midline. No JVD.  No adenopathy. PULMONARY: Good air entry bilaterally.  No adventitious sounds. CARDIOVASCULAR: S1 and S2. Regular rate and rhythm.  No rubs, murmurs or gallops heard. ABDOMEN: Benign. MUSCULOSKELETAL: No joint deformity, no clubbing, no edema.  NEUROLOGIC: No focal deficit, no gait disturbance, speech is fluent. SKIN: Intact,warm,dry. PSYCH: Mood and behavior normal.   Recent Results (from the past 2160 hours)  Pulmonary function test     Status: None   Collection Time: 02/25/24 10:38 AM  Result Value Ref Range   FVC-Pre 2.48 L   FVC-%Pred-Pre 93 %   FVC-Post 2.50 L   FVC-%Pred-Post 94 %   FVC-%Change-Post 0 %   FEV1-Pre 2.15 L   FEV1-%Pred-Pre 108 %   FEV1-Post 2.18 L  FEV1-%Pred-Post 110 %   FEV1-%Change-Post 1 %   FEV6-Pre 2.48 L   FEV6-%Pred-Pre 98 %   FEV6-Post 2.50 L   FEV6-%Pred-Post 99 %   FEV6-%Change-Post 0 %   Pre FEV1/FVC ratio 87 %   FEV1FVC-%Pred-Pre 115 %   Post FEV1/FVC ratio 87 %   FEV1FVC-%Change-Post 0 %   Pre FEV6/FVC Ratio 100 %   FEV6FVC-%Pred-Pre 105 %   Post FEV6/FVC ratio 100 %   FEV6FVC-%Pred-Post 105 %   FEF 25-75 Pre 3.23 L/sec   FEF2575-%Pred-Pre 207 %   FEF 25-75 Post 2.59 L/sec   FEF2575-%Pred-Post 166 %   FEF2575-%Change-Post -19 %   RV 1.86 L   RV % pred 82 %   TLC 4.35 L   TLC % pred 88 %   DLCO unc 13.92 ml/min/mmHg   DLCO unc % pred 76 %   DL/VA 6.40 ml/min/mmHg/L   DL/VA % pred 86 %  *PFTs discussed with patient in detail       Assessment & Plan:     ICD-10-CM   1. COPD with chronic bronchitis and emphysema (HCC)  J44.89     J43.9     2. Perennial allergic rhinitis  J30.89     3. Former smoker - remote  [phone]      Meds ordered this encounter  Medications   BREO ELLIPTA  100-25 MCG/ACT AEPB    Sig: Inhale 1 puff into the lungs daily.    Dispense:  60 each    Refill:  5   Discussion:    Mild chronic obstructive pulmonary disease (COPD) Mild COPD with minimal obstruction on recent pulmonary function tests, with excellent lung function. Current treatment may cause mild raspiness and dryness. - Switch from Trelegy to [facility] to reduce potential dryness and raspiness while maintaining effective COPD management - Ensure Ranell is covered by insurance at the same rate as Trelegy - Advise rinsing mouth well after using Breo - Schedule follow-up in six months  Nasal congestion Intermittent nasal congestion likely related to dryness rather than significant nasal obstruction, possibly contributing to mouth breathing during exercise. Current use of Claritin  noted. - Recommend trial of saline nasal spray to alleviate dryness and reduce mouth breathing - Suggest using Breathe Right strips during exercise to improve nasal airflow - Advise staying well hydrated      Advised if symptoms do not improve or worsen, to please contact office for sooner follow up or seek emergency care.    I spent 30 minutes of dedicated to the care of this patient on the date of this encounter to include pre-visit review of records, face-to-face time with the patient discussing conditions above, post visit ordering of testing, clinical documentation with the electronic health record, making appropriate referrals as documented, and communicating necessary findings to members of the patients care team.     C. Leita Sanders, MD Advanced Bronchoscopy PCCM [facility]    *This note was generated using voice recognition software/Dragon and/or AI transcription program.  Despite best efforts to proofread, errors can occur which  can change the meaning. Any transcriptional errors that result from this process are unintentional and may not be fully corrected at the time of dictation.

## 2024-03-01 DIAGNOSIS — Z961 Presence of intraocular lens: Secondary | ICD-10-CM | POA: Diagnosis not present

## 2024-03-19 ENCOUNTER — Encounter: Payer: Self-pay | Admitting: Pulmonary Disease

## 2024-03-21 DIAGNOSIS — M5417 Radiculopathy, lumbosacral region: Secondary | ICD-10-CM | POA: Diagnosis not present

## 2024-03-21 DIAGNOSIS — M9903 Segmental and somatic dysfunction of lumbar region: Secondary | ICD-10-CM | POA: Diagnosis not present

## 2024-03-21 DIAGNOSIS — M5136 Other intervertebral disc degeneration, lumbar region with discogenic back pain only: Secondary | ICD-10-CM | POA: Diagnosis not present

## 2024-03-21 DIAGNOSIS — M5416 Radiculopathy, lumbar region: Secondary | ICD-10-CM | POA: Diagnosis not present

## 2024-03-22 NOTE — Progress Notes (Signed)
    Assessment & Plan:  1. COPD suggested by initial evaluation (Primary)  2. Shortness of breath   Patient Instructions  I suspect you have a mild case of COPD/chronic bronchitis.  Currently we cannot perform breathing tests due to the COVID-19 pandemic.  I will give you a trial of Spiriva  2 inhalations daily and see how that does for your shortness of breath.  See you in follow-up in 2 months time and hopefully by that time we can determine if breathing test can be performed.  Please note: late entry documentation due to logistical difficulties during COVID-19 pandemic. This note is filed for information purposes only, and is not intended to be used for billing, nor does it represent the full scope/nature of the visit in question. Please see any associated scanned media linked to date of encounter for additional pertinent information.  Subjective:    HPI: Savannah Cox is a 78 y.o. female presenting to the pulmonology clinic on 07/12/2019 with report of: Pulmonary Consult (Referred by Dr. Glendia for SOB and ABN CXR. States her SOB is on exertion. Pt denies any cough, wheezing, fever, or chills.)     Outpatient Encounter Medications as of 07/12/2019  Medication Sig   loratadine  (CLARITIN ) 10 MG tablet Take 10 mg by mouth daily.    [DISCONTINUED] betamethasone  dipropionate 0.05 % cream Apply topically 2 (two) times daily as needed. (Patient not taking: Reported on 02/14/2021)   [DISCONTINUED] levothyroxine  (SYNTHROID ) 75 MCG tablet TAKE 1 TABLET BY MOUTH EVERY DAY   [DISCONTINUED] lisinopril -hydrochlorothiazide  (ZESTORETIC ) 10-12.5 MG tablet TAKE 1 TABLET BY MOUTH EVERY DAY   [DISCONTINUED] Multiple Vitamins-Minerals (CENTRUM SILVER ULTRA WOMENS PO) Take 1 tablet by mouth daily.    [DISCONTINUED] omeprazole  (PRILOSEC ) 40 MG capsule TAKE 1 CAPSULE (40 MG TOTAL) BY MOUTH ONCE DAILY TAKE 30 MINS BEFORE A MEAL   [DISCONTINUED] rosuvastatin  (CRESTOR ) 5 MG tablet TAKE 1 TABLET BY MOUTH  EVERY DAY   [DISCONTINUED] sertraline  (ZOLOFT ) 50 MG tablet TAKE 1 TABLET BY MOUTH EVERY DAY   [DISCONTINUED] Tiotropium Bromide Monohydrate  (SPIRIVA  RESPIMAT) 2.5 MCG/ACT AERS Inhale 2 puffs into the lungs daily.   [DISCONTINUED] Tiotropium Bromide Monohydrate  (SPIRIVA  RESPIMAT) 2.5 MCG/ACT AERS Inhale 2 puffs into the lungs daily.   No facility-administered encounter medications on file as of 07/12/2019.      Objective:   Vitals:   07/12/19 1332  BP: 110/68  Pulse: 76  Temp: (!) 97.3 F (36.3 C)  Height: 5' 3 (1.6 m)  Weight: 141 lb 6.4 oz (64.1 kg)  SpO2: 99% Comment: on ra  TempSrc: Temporal  BMI (Calculated): 25.05     Physical exam documentation is limited by delayed entry of information.

## 2024-03-25 ENCOUNTER — Encounter: Payer: Self-pay | Admitting: Pharmacist

## 2024-03-25 NOTE — Progress Notes (Signed)
 Pharmacy Quality Measure Review  This patient is appearing on a report for being at risk of failing the adherence measure for cholesterol (statin) medications this calendar year.   Medication: rosuvastatin  20 mg Last fill date: 09/11/23 for 90 day supply  Per chart notes, patient has been taking 1 tablet three times weekly. Most recently, PCP has added additional tablet (4 tabs/week).   Current Rx still written as 1 tablet daily therefore patient will fail [facility] adherence measure for 2025.  Msg sent to prescriber, consider updating prescription to 4 tablets weekly as to prevent failure of measure in 2026.

## 2024-03-28 ENCOUNTER — Other Ambulatory Visit: Payer: Self-pay

## 2024-03-28 MED ORDER — ROSUVASTATIN CALCIUM 20 MG PO TABS: 20.0000 mg | ORAL_TABLET | ORAL | 2 refills | Status: DC

## 2024-03-28 NOTE — Progress Notes (Signed)
 Rx with new directions sent in.

## 2024-03-28 NOTE — Progress Notes (Signed)
 Ok

## 2024-03-28 NOTE — Progress Notes (Signed)
 Ok to send in rx for 4x per week?

## 2024-03-29 ENCOUNTER — Other Ambulatory Visit (INDEPENDENT_AMBULATORY_CARE_PROVIDER_SITE_OTHER)

## 2024-03-29 DIAGNOSIS — E7800 Pure hypercholesterolemia, unspecified: Secondary | ICD-10-CM | POA: Diagnosis not present

## 2024-03-29 DIAGNOSIS — E039 Hypothyroidism, unspecified: Secondary | ICD-10-CM | POA: Diagnosis not present

## 2024-03-29 LAB — LIPID PANEL
Cholesterol: 199 mg/dL (ref 0–200)
HDL: 83.7 mg/dL (ref >39.00)
LDL Cholesterol: 101 mg/dL — ABNORMAL HIGH (ref 0–99)
NonHDL: 115.1
Total CHOL/HDL Ratio: 2
Triglycerides: 69 mg/dL (ref 0.0–149.0)
VLDL: 13.8 mg/dL (ref 0.0–40.0)

## 2024-03-29 LAB — BASIC METABOLIC PANEL WITH GFR
BUN: 15 mg/dL (ref 6–23)
CO2: 27 meq/L (ref 19–32)
Calcium: 9.2 mg/dL (ref 8.4–10.5)
Chloride: 106 meq/L (ref 96–112)
Creatinine, Ser: 0.74 mg/dL (ref 0.40–1.20)
GFR: 78.64 mL/min (ref >60.00)
Glucose, Bld: 105 mg/dL — ABNORMAL HIGH (ref 70–99)
Potassium: 4 meq/L (ref 3.5–5.1)
Sodium: 140 meq/L (ref 135–145)

## 2024-03-29 LAB — HEPATIC FUNCTION PANEL
ALT: 29 U/L (ref 0–35)
AST: 38 U/L — ABNORMAL HIGH (ref 0–37)
Albumin: 4.2 g/dL (ref 3.5–5.2)
Alkaline Phosphatase: 54 U/L (ref 39–117)
Bilirubin, Direct: 0.1 mg/dL (ref 0.0–0.3)
Total Bilirubin: 0.5 mg/dL (ref 0.2–1.2)
Total Protein: 6.7 g/dL (ref 6.0–8.3)

## 2024-03-29 LAB — TSH: TSH: 2.21 u[IU]/mL (ref 0.35–5.50)

## 2024-03-30 ENCOUNTER — Ambulatory Visit: Payer: Self-pay | Admitting: Internal Medicine

## 2024-03-31 ENCOUNTER — Encounter: Payer: Self-pay | Admitting: Internal Medicine

## 2024-03-31 ENCOUNTER — Ambulatory Visit: Admitting: Internal Medicine

## 2024-03-31 ENCOUNTER — Other Ambulatory Visit: Payer: Self-pay | Admitting: Internal Medicine

## 2024-03-31 VITALS — BP 122/72 | HR 84 | Temp 98.1°F | Ht 63.0 in | Wt 141.0 lb

## 2024-03-31 DIAGNOSIS — Z23 Encounter for immunization: Secondary | ICD-10-CM | POA: Diagnosis not present

## 2024-03-31 DIAGNOSIS — Z860100 Personal history of colon polyps, unspecified: Secondary | ICD-10-CM

## 2024-03-31 DIAGNOSIS — E039 Hypothyroidism, unspecified: Secondary | ICD-10-CM

## 2024-03-31 DIAGNOSIS — J432 Centrilobular emphysema: Secondary | ICD-10-CM | POA: Diagnosis not present

## 2024-03-31 DIAGNOSIS — I10 Essential (primary) hypertension: Secondary | ICD-10-CM | POA: Diagnosis not present

## 2024-03-31 DIAGNOSIS — I700 Atherosclerosis of aorta: Secondary | ICD-10-CM

## 2024-03-31 DIAGNOSIS — E7800 Pure hypercholesterolemia, unspecified: Secondary | ICD-10-CM | POA: Diagnosis not present

## 2024-03-31 DIAGNOSIS — M79645 Pain in left finger(s): Secondary | ICD-10-CM

## 2024-03-31 DIAGNOSIS — M545 Low back pain, unspecified: Secondary | ICD-10-CM

## 2024-03-31 DIAGNOSIS — R7989 Other specified abnormal findings of blood chemistry: Secondary | ICD-10-CM

## 2024-03-31 DIAGNOSIS — D649 Anemia, unspecified: Secondary | ICD-10-CM | POA: Diagnosis not present

## 2024-03-31 DIAGNOSIS — K219 Gastro-esophageal reflux disease without esophagitis: Secondary | ICD-10-CM

## 2024-03-31 DIAGNOSIS — M810 Age-related osteoporosis without current pathological fracture: Secondary | ICD-10-CM

## 2024-03-31 MED ORDER — ROSUVASTATIN CALCIUM 20 MG PO TABS: 20.0000 mg | ORAL_TABLET | ORAL | 2 refills | Status: AC

## 2024-03-31 MED ORDER — LISINOPRIL 10 MG PO TABS: 10.0000 mg | ORAL_TABLET | Freq: Every day | ORAL | 1 refills | Status: AC

## 2024-03-31 MED ORDER — LEVOTHYROXINE SODIUM 75 MCG PO TABS: 75.0000 ug | ORAL_TABLET | Freq: Every day | ORAL | 2 refills | Status: AC

## 2024-03-31 MED ORDER — OMEPRAZOLE MAGNESIUM 20 MG PO TBEC: 20.0000 mg | DELAYED_RELEASE_TABLET | Freq: Every day | ORAL | 1 refills | Status: DC

## 2024-03-31 NOTE — Progress Notes (Signed)
 Subjective:    Patient ID: Savannah Cox, female    DOB: 04-Nov-1945, 78 y.o.   MRN: [id number]  Patient here for  Chief Complaint  Patient presents with   Medical Management of Chronic Issues    4 mth f/u    HPI Here for a scheduled follow up - follow up regarding hypothyroidism, hypertension and hypercholesterolemia. Had f/u with pulmonary 02/29/24 - f/u COPD. Trelegy changed to breo. Overall breathing stable. No chest pain reported. No increased cough. Has had low back pain. Seeing chiropractor. Also - third left finger. No abdominal pain or bowel change reported.    Past Medical History:  Diagnosis Date   Allergic state    Anemia    Complication of anesthesia    Diverticulosis    GERD (gastroesophageal reflux disease)    History of hiatal hernia    History of kidney stones    Hypercholesterolemia    Hypertension    Hypothyroidism    Osteoporosis, postmenopausal    PONV (postoperative nausea and vomiting)    nauseated with colonoscopy   Shingles    Past Surgical History:  Procedure Laterality Date   BREAST EXCISIONAL BIOPSY Left    benign in 90's   COLONOSCOPY WITH PROPOFOL  N/A 01/28/2016   Procedure: COLONOSCOPY WITH PROPOFOL ;  Surgeon: Gladis RAYMOND Mariner, MD;  Location: [facility] ENDOSCOPY;  Service: Endoscopy;  Laterality: N/A;   EXCISION METACARPAL MASS Right 04/30/2017   Procedure: EXCISION MUCOID CYST FIFTH FINGER RIGHT HAND;  Surgeon: Kathlynn Sharper, MD;  Location: [facility] ORS;  Service: Orthopedics;  Laterality: Right;   EYE SURGERY Bilateral 2010   cataracts   Family History  Problem Relation Age of Onset   Heart disease Father        died age 26 - myocardial infarction   Hypercholesterolemia Mother    Arthritis Mother    Heart attack Mother    Kidney cancer Other        aunt   Breast cancer Maternal Aunt    Colon cancer Neg Hx    Social History   Socioeconomic History   Marital status: Married    Spouse name: Not on file   Number of children: 3    Years of education: 16   Highest education level: Bachelor's degree (e.g., BA, AB, BS)  Occupational History   Occupation: retired Magazine Features Editor: RETIRED  Tobacco Use   Smoking status: Former    Current packs/day: 0.00    Average packs/day: 1.5 packs/day for 30.0 years (45.0 ttl pk-yrs)    Types: Cigarettes    Start date: 57    Quit date: 1990    Years since quitting: 35.8   Smokeless tobacco: Never  Vaping Use   Vaping status: Never Used  Substance and Sexual Activity   Alcohol use: Yes    Alcohol/week: 0.0 standard drinks of alcohol    Comment: Rare drink, one drink per month   Drug use: No   Sexual activity: Yes  Other Topics Concern   Not on file  Social History Narrative   Not on file   Social Drivers of Health   Financial Resource Strain: Low Risk  (03/24/2024)   Overall Financial Resource Strain (CARDIA)    Difficulty of Paying Living Expenses: Not hard at all  Food Insecurity: No Food Insecurity (03/24/2024)   Hunger Vital Sign    Worried About Running Out of Food in the Last Year: Never true    Ran Out of Food in the  Last Year: Never true  Transportation Needs: No Transportation Needs (03/24/2024)   PRAPARE - Administrator, Civil Service (Medical): No    Lack of Transportation (Non-Medical): No  Physical Activity: Insufficiently Active (03/24/2024)   Exercise Vital Sign    Days of Exercise per Week: 2 days    Minutes of Exercise per Session: 30 min  Stress: No Stress Concern Present (03/24/2024)   [employer] of Occupational Health - Occupational Stress Questionnaire    Feeling of Stress: Not at all  Social Connections: Socially Integrated (03/24/2024)   Social Connection and Isolation Panel    Frequency of Communication with Friends and Family: Three times a week    Frequency of Social Gatherings with Friends and Family: Three times a week    Attends Religious Services: More than 4 times per year    Active Member of Clubs or  Organizations: Yes    Attends Banker Meetings: More than 4 times per year    Marital Status: Married     Review of Systems  Constitutional:  Negative for appetite change and unexpected weight change.  HENT:  Negative for congestion and sinus pressure.   Respiratory:  Negative for cough, chest tightness and shortness of breath.   Cardiovascular:  Negative for chest pain, palpitations and leg swelling.  Gastrointestinal:  Negative for abdominal pain, diarrhea, nausea and vomiting.  Genitourinary:  Negative for difficulty urinating and dysuria.  Musculoskeletal:  Positive for back pain. Negative for myalgias.  Skin:  Negative for color change and rash.  Neurological:  Negative for dizziness and headaches.  Psychiatric/Behavioral:  Negative for agitation and dysphoric mood.        Objective:     BP 122/72   Pulse 84   Temp 98.1 F (36.7 C) (Oral)   Ht 5' 3 (1.6 m)   Wt 141 lb (64 kg)   SpO2 95%   BMI 24.98 kg/m  Wt Readings from Last 3 Encounters:  03/31/24 141 lb (64 kg)  02/29/24 140 lb (63.5 kg)  02/25/24 140 lb 12.8 oz (63.9 kg)    Physical Exam Vitals reviewed.  Constitutional:      General: She is not in acute distress.    Appearance: Normal appearance.  HENT:     Head: Normocephalic and atraumatic.     Right Ear: External ear normal.     Left Ear: External ear normal.     Mouth/Throat:     Pharynx: No oropharyngeal exudate or posterior oropharyngeal erythema.  Eyes:     General: No scleral icterus.       Right eye: No discharge.        Left eye: No discharge.     Conjunctiva/sclera: Conjunctivae normal.  Neck:     Thyroid : No thyromegaly.  Cardiovascular:     Rate and Rhythm: Normal rate and regular rhythm.  Pulmonary:     Effort: No respiratory distress.     Breath sounds: Normal breath sounds. No wheezing.  Abdominal:     General: Bowel sounds are normal.     Palpations: Abdomen is soft.     Tenderness: There is no abdominal  tenderness.  Musculoskeletal:        General: No swelling or tenderness.     Cervical back: Neck supple. No tenderness.  Lymphadenopathy:     Cervical: No cervical adenopathy.  Skin:    Findings: No erythema or rash.  Neurological:     Mental Status: She is alert.  Psychiatric:  Mood and Affect: Mood normal.        Behavior: Behavior normal.         Outpatient Encounter Medications as of 03/31/2024  Medication Sig   BREO ELLIPTA  100-25 MCG/ACT AEPB Inhale 1 puff into the lungs daily.   loratadine  (CLARITIN ) 10 MG tablet Take 10 mg by mouth daily.    [DISCONTINUED] levothyroxine  (SYNTHROID ) 75 MCG tablet Take 1 tablet (75 mcg total) by mouth daily.   [DISCONTINUED] lisinopril  (ZESTRIL ) 10 MG tablet Take 1 tablet (10 mg total) by mouth daily.   [DISCONTINUED] omeprazole  (PRILOSEC  OTC) 20 MG tablet Take 1 tablet (20 mg total) by mouth daily.   [DISCONTINUED] rosuvastatin  (CRESTOR ) 20 MG tablet Take 1 tablet (20 mg total) by mouth 4 (four) times a week.   levothyroxine  (SYNTHROID ) 75 MCG tablet Take 1 tablet (75 mcg total) by mouth daily.   lisinopril  (ZESTRIL ) 10 MG tablet Take 1 tablet (10 mg total) by mouth daily.   rosuvastatin  (CRESTOR ) 20 MG tablet Take 1 tablet (20 mg total) by mouth 4 (four) times a week.   sertraline  (ZOLOFT ) 50 MG tablet TAKE 1 TABLET BY MOUTH EVERY DAY   [DISCONTINUED] omeprazole  (PRILOSEC  OTC) 20 MG tablet Take 1 tablet (20 mg total) by mouth daily.   No facility-administered encounter medications on file as of 03/31/2024.     Lab Results  Component Value Date   WBC 4.6 11/26/2023   HGB 13.2 11/26/2023   HCT 39.8 11/26/2023   PLT 195.0 11/26/2023   GLUCOSE 105 (H) 03/29/2024   CHOL 199 03/29/2024   TRIG 69.0 03/29/2024   HDL 83.70 03/29/2024   LDLDIRECT 165.9 05/11/2013   LDLCALC 101 (H) 03/29/2024   ALT 29 03/29/2024   AST 38 (H) 03/29/2024   NA 140 03/29/2024   K 4.0 03/29/2024   CL 106 03/29/2024   CREATININE 0.74 03/29/2024    BUN 15 03/29/2024   CO2 27 03/29/2024   TSH 2.21 03/29/2024   INR 0.96 04/14/2016    MM 3D SCREENING MAMMOGRAM BILATERAL BREAST Result Date: 12/10/2023 CLINICAL DATA:  Screening. EXAM: DIGITAL SCREENING BILATERAL MAMMOGRAM WITH TOMOSYNTHESIS AND CAD TECHNIQUE: Bilateral screening digital craniocaudal and mediolateral oblique mammograms were obtained. Bilateral screening digital breast tomosynthesis was performed. The images were evaluated with computer-aided detection. COMPARISON:  Previous exam(s). ACR Breast Density Category b: There are scattered areas of fibroglandular density. FINDINGS: There are no findings suspicious for malignancy. IMPRESSION: No mammographic evidence of malignancy. A result letter of this screening mammogram will be mailed directly to the patient. RECOMMENDATION: Screening mammogram in one year. (Code:SM-B-01Y) BI-RADS CATEGORY  1: Negative. Electronically Signed   By: Inocente Ast M.D.   On: 12/10/2023 12:51       Assessment & Plan:  Abnormal liver function test Assessment & Plan: Slightly increased AST 03/29/24. Recheck liver panel in several weeks to confirm stable/normal.   Orders: -     Hepatic function panel; Future  Hypercholesterolemia Assessment & Plan: Continue crestor .  Low cholesterol diet and exercise.  Follow lipid panel and liver function tests. Appears to be tolerating current dose.   Orders: -     Basic metabolic panel with GFR; Future -     Lipid panel; Future -     Hepatic function panel; Future  Anemia, unspecified type Assessment & Plan: Follow cbc.   Orders: -     CBC with Differential/Platelet; Future  Osteoporosis without current pathological fracture, unspecified osteoporosis type Assessment & Plan:  Had f/u with endocrinology  10/19/23 - next reclast  06/2024 and f/u bone density 12/2024.    Aortic atherosclerosis Assessment & Plan: Continue crestor .    Centrilobular emphysema (HCC) Assessment & Plan: Being followed by  pulmonary. Changed to [facility]. Breathing stable. Follow.    Pain of left middle finger Assessment & Plan: Stretches. Discussed further evaluation - possible injection. Notify if desires further treatment.    Hypothyroidism, unspecified type Assessment & Plan: Continue thyroid  replacement. Follow tsh.    Primary hypertension Assessment & Plan: Continue lisinopril . Blood pressure as outlined. No change in medication. Follow pressures. Follow metabolic panel.    History of colonic polyps Assessment & Plan: Colonoscopy 08/04/19- Dr Suzann- normal colon. TI and random biopsies negative.    Gastroesophageal reflux disease, unspecified whether esophagitis present Assessment & Plan: Currently on 20mg  omeprazole . No upper symptoms reported. Follow.    Midline low back pain without sciatica, unspecified chronicity Assessment & Plan: Low back pain. Seeing chiropractor. Stretches. Exercise. Notify me if desires further intervention - PT and/or Ortho referral.    Other orders -     Flu vaccine HIGH DOSE PF(Fluzone Trivalent) -     Levothyroxine  Sodium; Take 1 tablet (75 mcg total) by mouth daily.  Dispense: 90 tablet; Refill: 2 -     Lisinopril ; Take 1 tablet (10 mg total) by mouth daily.  Dispense: 90 tablet; Refill: 1 -     Rosuvastatin  Calcium ; Take 1 tablet (20 mg total) by mouth 4 (four) times a week.  Dispense: 52 tablet; Refill: 2     Allena Hamilton, MD

## 2024-04-06 NOTE — Telephone Encounter (Signed)
Rx ok'd for prilosec.

## 2024-04-10 ENCOUNTER — Encounter: Payer: Self-pay | Admitting: Internal Medicine

## 2024-04-10 NOTE — Assessment & Plan Note (Signed)
 Currently on 20mg  omeprazole . No upper symptoms reported. Follow.

## 2024-04-10 NOTE — Assessment & Plan Note (Signed)
 Low back pain. Seeing chiropractor. Stretches. Exercise. Notify me if desires further intervention - PT and/or Ortho referral.

## 2024-04-10 NOTE — Assessment & Plan Note (Signed)
 Being followed by pulmonary. Changed to [facility]. Breathing stable. Follow.

## 2024-04-10 NOTE — Assessment & Plan Note (Signed)
 Had f/u with endocrinology 10/19/23 - next reclast  06/2024 and f/u bone density 12/2024.

## 2024-04-10 NOTE — Assessment & Plan Note (Signed)
 Continue crestor .  Low cholesterol diet and exercise.  Follow lipid panel and liver function tests. Appears to be tolerating current dose.

## 2024-04-10 NOTE — Assessment & Plan Note (Signed)
 Continue crestor

## 2024-04-10 NOTE — Assessment & Plan Note (Signed)
 Continue lisinopril . Blood pressure as outlined. No change in medication. Follow pressures. Follow metabolic panel.

## 2024-04-10 NOTE — Assessment & Plan Note (Signed)
 Follow cbc.

## 2024-04-10 NOTE — Assessment & Plan Note (Signed)
 Stretches. Discussed further evaluation - possible injection. Notify if desires further treatment.

## 2024-04-10 NOTE — Assessment & Plan Note (Signed)
Colonoscopy 08/04/19- Dr Doy Hutching- normal colon. TI and random biopsies negative.

## 2024-04-10 NOTE — Assessment & Plan Note (Signed)
Continue thyroid replacement.  Follow tsh.   

## 2024-04-10 NOTE — Assessment & Plan Note (Signed)
 Slightly increased AST 03/29/24. Recheck liver panel in several weeks to confirm stable/normal.

## 2024-04-18 DIAGNOSIS — M9903 Segmental and somatic dysfunction of lumbar region: Secondary | ICD-10-CM | POA: Diagnosis not present

## 2024-04-18 DIAGNOSIS — M5416 Radiculopathy, lumbar region: Secondary | ICD-10-CM | POA: Diagnosis not present

## 2024-04-18 DIAGNOSIS — M5136 Other intervertebral disc degeneration, lumbar region with discogenic back pain only: Secondary | ICD-10-CM | POA: Diagnosis not present

## 2024-04-18 DIAGNOSIS — M5417 Radiculopathy, lumbosacral region: Secondary | ICD-10-CM | POA: Diagnosis not present

## 2024-04-28 ENCOUNTER — Other Ambulatory Visit (INDEPENDENT_AMBULATORY_CARE_PROVIDER_SITE_OTHER)

## 2024-04-28 DIAGNOSIS — D649 Anemia, unspecified: Secondary | ICD-10-CM | POA: Diagnosis not present

## 2024-04-28 DIAGNOSIS — E7800 Pure hypercholesterolemia, unspecified: Secondary | ICD-10-CM

## 2024-04-28 DIAGNOSIS — R7989 Other specified abnormal findings of blood chemistry: Secondary | ICD-10-CM

## 2024-04-28 LAB — BASIC METABOLIC PANEL WITH GFR
BUN: 13 mg/dL (ref 6–23)
CO2: 27 meq/L (ref 19–32)
Calcium: 8.9 mg/dL (ref 8.4–10.5)
Chloride: 108 meq/L (ref 96–112)
Creatinine, Ser: 0.68 mg/dL (ref 0.40–1.20)
GFR: 84.6 mL/min (ref >60.00)
Glucose, Bld: 59 mg/dL — ABNORMAL LOW (ref 70–99)
Potassium: 4 meq/L (ref 3.5–5.1)
Sodium: 143 meq/L (ref 135–145)

## 2024-04-28 LAB — CBC WITH DIFFERENTIAL/PLATELET
Basophils Absolute: 0.1 K/uL (ref 0.0–0.1)
Basophils Relative: 1.4 % (ref 0.0–3.0)
Eosinophils Absolute: 0.1 K/uL (ref 0.0–0.7)
Eosinophils Relative: 1.8 % (ref 0.0–5.0)
HCT: 40.1 % (ref 36.0–46.0)
Hemoglobin: 13.4 g/dL (ref 12.0–15.0)
Lymphocytes Relative: 21 % (ref 12.0–46.0)
Lymphs Abs: 1.1 K/uL (ref 0.7–4.0)
MCHC: 33.4 g/dL (ref 30.0–36.0)
MCV: 98.1 fl (ref 78.0–100.0)
Monocytes Absolute: 0.8 K/uL (ref 0.1–1.0)
Monocytes Relative: 14.7 % — ABNORMAL HIGH (ref 3.0–12.0)
Neutro Abs: 3.3 K/uL (ref 1.4–7.7)
Neutrophils Relative %: 61.1 % (ref 43.0–77.0)
Platelets: 209 K/uL (ref 150.0–400.0)
RBC: 4.08 Mil/uL (ref 3.87–5.11)
RDW: 14 % (ref 11.5–15.5)
WBC: 5.4 K/uL (ref 4.0–10.5)

## 2024-04-28 LAB — LIPID PANEL
Cholesterol: 169 mg/dL (ref 0–200)
HDL: 75.8 mg/dL (ref >39.00)
LDL Cholesterol: 78 mg/dL (ref 0–99)
NonHDL: 93.54
Total CHOL/HDL Ratio: 2
Triglycerides: 79 mg/dL (ref 0.0–149.0)
VLDL: 15.8 mg/dL (ref 0.0–40.0)

## 2024-04-28 LAB — HEPATIC FUNCTION PANEL
ALT: 32 U/L (ref 0–35)
AST: 36 U/L (ref 0–37)
Albumin: 4.1 g/dL (ref 3.5–5.2)
Alkaline Phosphatase: 54 U/L (ref 39–117)
Bilirubin, Direct: 0.1 mg/dL (ref 0.0–0.3)
Total Bilirubin: 0.5 mg/dL (ref 0.2–1.2)
Total Protein: 6.1 g/dL (ref 6.0–8.3)

## 2024-04-29 ENCOUNTER — Ambulatory Visit: Payer: Self-pay | Admitting: Internal Medicine

## 2024-04-29 NOTE — Telephone Encounter (Signed)
 Copied from CRM #[id number]. Topic: Clinical - Lab/Test Results >> Apr 29, 2024 10:58 AM Vena HERO wrote: Reason for CRM: Relayed lab results to pt and she states she has never had an issue with low blood sugar to her knowledge and she will increase her protein during meals. No further questions but feel free to contact her if there are other concerns.

## 2024-05-16 DIAGNOSIS — M5417 Radiculopathy, lumbosacral region: Secondary | ICD-10-CM | POA: Diagnosis not present

## 2024-05-16 DIAGNOSIS — M5416 Radiculopathy, lumbar region: Secondary | ICD-10-CM | POA: Diagnosis not present

## 2024-05-16 DIAGNOSIS — M5136 Other intervertebral disc degeneration, lumbar region with discogenic back pain only: Secondary | ICD-10-CM | POA: Diagnosis not present

## 2024-05-16 DIAGNOSIS — M9903 Segmental and somatic dysfunction of lumbar region: Secondary | ICD-10-CM | POA: Diagnosis not present

## 2024-05-27 ENCOUNTER — Other Ambulatory Visit: Payer: Self-pay | Admitting: Internal Medicine

## 2024-08-02 ENCOUNTER — Other Ambulatory Visit

## 2024-08-04 ENCOUNTER — Ambulatory Visit: Admitting: Internal Medicine

## 2024-09-27 ENCOUNTER — Encounter: Admitting: Dermatology

## 2024-09-28 ENCOUNTER — Ambulatory Visit
